# Patient Record
Sex: Female | Born: 1960 | Race: Black or African American | Hispanic: No | Marital: Single | Smoking: Never smoker
Health system: Southern US, Community
[De-identification: ages and names within clinical notes are randomized; demographics above are authoritative.]

## PROBLEM LIST (undated history)

## (undated) DIAGNOSIS — E119 Type 2 diabetes mellitus without complications: Secondary | ICD-10-CM

## (undated) DIAGNOSIS — M549 Dorsalgia, unspecified: Secondary | ICD-10-CM

## (undated) DIAGNOSIS — N950 Postmenopausal bleeding: Secondary | ICD-10-CM

## (undated) DIAGNOSIS — E669 Obesity, unspecified: Secondary | ICD-10-CM

## (undated) DIAGNOSIS — Z973 Presence of spectacles and contact lenses: Secondary | ICD-10-CM

## (undated) DIAGNOSIS — Z794 Long term (current) use of insulin: Secondary | ICD-10-CM

## (undated) DIAGNOSIS — G629 Polyneuropathy, unspecified: Secondary | ICD-10-CM

## (undated) DIAGNOSIS — M1A9XX0 Chronic gout, unspecified, without tophus (tophi): Secondary | ICD-10-CM

## (undated) DIAGNOSIS — E876 Hypokalemia: Secondary | ICD-10-CM

## (undated) DIAGNOSIS — G4730 Sleep apnea, unspecified: Secondary | ICD-10-CM

## (undated) DIAGNOSIS — E039 Hypothyroidism, unspecified: Secondary | ICD-10-CM

## (undated) DIAGNOSIS — D869 Sarcoidosis, unspecified: Secondary | ICD-10-CM

## (undated) DIAGNOSIS — Z9289 Personal history of other medical treatment: Secondary | ICD-10-CM

## (undated) DIAGNOSIS — I10 Essential (primary) hypertension: Secondary | ICD-10-CM

## (undated) DIAGNOSIS — Z1231 Encounter for screening mammogram for malignant neoplasm of breast: Secondary | ICD-10-CM

## (undated) DIAGNOSIS — G4733 Obstructive sleep apnea (adult) (pediatric): Secondary | ICD-10-CM

## (undated) DIAGNOSIS — E049 Nontoxic goiter, unspecified: Secondary | ICD-10-CM

## (undated) DIAGNOSIS — R7301 Impaired fasting glucose: Secondary | ICD-10-CM

## (undated) DIAGNOSIS — E785 Hyperlipidemia, unspecified: Secondary | ICD-10-CM

## (undated) DIAGNOSIS — D860 Sarcoidosis of lung: Secondary | ICD-10-CM

## (undated) DIAGNOSIS — Z01419 Encounter for gynecological examination (general) (routine) without abnormal findings: Secondary | ICD-10-CM

## (undated) HISTORY — DX: Sarcoidosis, unspecified: D86.9

## (undated) HISTORY — DX: Encounter for screening mammogram for malignant neoplasm of breast: Z12.31

## (undated) HISTORY — DX: Obesity, unspecified: E66.9

## (undated) HISTORY — PX: CYST EXCISION: SHX5701

## (undated) HISTORY — DX: Hyperlipidemia, unspecified: E78.5

## (undated) HISTORY — DX: Impaired fasting glucose: R73.01

## (undated) HISTORY — PX: TUBAL LIGATION: SHX77

## (undated) HISTORY — DX: Encounter for gynecological examination (general) (routine) without abnormal findings: Z01.419

## (undated) HISTORY — DX: Personal history of other medical treatment: Z92.89

---

## 1998-02-05 DIAGNOSIS — I10 Essential (primary) hypertension: Secondary | ICD-10-CM

## 1998-02-05 HISTORY — DX: Essential (primary) hypertension: I10

## 1999-04-07 ENCOUNTER — Other Ambulatory Visit: Admission: RE | Admit: 1999-04-07 | Discharge: 1999-04-07 | Payer: Self-pay | Admitting: Family Medicine

## 2000-11-20 ENCOUNTER — Emergency Department (HOSPITAL_COMMUNITY): Admission: EM | Admit: 2000-11-20 | Discharge: 2000-11-20 | Payer: Self-pay | Admitting: Emergency Medicine

## 2003-04-06 ENCOUNTER — Other Ambulatory Visit: Admission: RE | Admit: 2003-04-06 | Discharge: 2003-04-06 | Payer: Self-pay | Admitting: Obstetrics and Gynecology

## 2007-08-22 ENCOUNTER — Encounter: Admission: RE | Admit: 2007-08-22 | Discharge: 2007-08-22 | Payer: Self-pay | Admitting: Cardiology

## 2010-02-26 ENCOUNTER — Encounter: Payer: Self-pay | Admitting: Interventional Cardiology

## 2010-03-22 ENCOUNTER — Inpatient Hospital Stay (INDEPENDENT_AMBULATORY_CARE_PROVIDER_SITE_OTHER)
Admission: RE | Admit: 2010-03-22 | Discharge: 2010-03-22 | Disposition: A | Discharge status: Home / Self Care | Payer: BC Managed Care – PPO | Source: Ambulatory Visit | Attending: Emergency Medicine | Admitting: Emergency Medicine

## 2010-03-22 DIAGNOSIS — I889 Nonspecific lymphadenitis, unspecified: Secondary | ICD-10-CM

## 2010-03-22 DIAGNOSIS — I10 Essential (primary) hypertension: Secondary | ICD-10-CM

## 2010-03-22 DIAGNOSIS — M436 Torticollis: Secondary | ICD-10-CM

## 2010-06-23 ENCOUNTER — Ambulatory Visit (HOSPITAL_BASED_OUTPATIENT_CLINIC_OR_DEPARTMENT_OTHER): Payer: BC Managed Care – PPO | Attending: Cardiology

## 2010-06-23 DIAGNOSIS — G4733 Obstructive sleep apnea (adult) (pediatric): Secondary | ICD-10-CM | POA: Insufficient documentation

## 2010-06-24 DIAGNOSIS — G4733 Obstructive sleep apnea (adult) (pediatric): Secondary | ICD-10-CM

## 2010-06-26 NOTE — Procedures (Signed)
NAME:  Amy Rosario, Amy Rosario NO.:  [id number]  MEDICAL RECORD NO.:  [id number]          PATIENT TYPE:  OUT  LOCATION:  SLEEP CENTER                 FACILITY:  [facility]  PHYSICIAN:  Clinton D. Maple Hudson, MD, FCCP, FACPDATE OF BIRTH:  1962/05/13  DATE OF STUDY:  06/23/2010                           NOCTURNAL POLYSOMNOGRAM  REFERRING PHYSICIAN:  Osvaldo Shipper. Spruill, M.D.  INDICATION FOR STUDY:  Hypersomnia with sleep apnea.  EPWORTH SLEEPINESS SCORE:  11/24.  BMI 48.1.  Weight 335 pounds, height 70 inches.  Neck 16 inches.  MEDICATIONS:  Home medications are charted and reviewed.  SLEEP ARCHITECTURE:  Total sleep time 405 minutes with sleep efficiency 92.9%.  Stage I was 4.7%, stage II 81.2%, stage III absent, REM 14.1% of total sleep time.  Sleep latency 1.5 minutes, REM latency 66 minutes, awake after sleep onset 29.5 minutes, arousal index 13.6.  Bedtime medication:  Clonidine.  RESPIRATORY DATA:  Apnea/hypopnea index (AHI) 11.3 per hour.  A total of 76 events was scored including 1 obstructive apnea and 75 hypopneas. Events were not positional.  REM AHI 11.6 per hour.  There were insufficient early events to permit application of CPAP titration by split protocol on this study night.  Most events appeared after 1 a.m.  OXYGEN DATA:  Moderate snoring with oxygen desaturation to a nadir of 77%.  Mean oxygen saturation through the study of 94.5% on room air and a total of 3.6 minutes of total recording time with saturation less than 88% on room air.  CARDIAC DATA:  Normal sinus rhythm.  MOVEMENT-PARASOMNIA:  No significant movement disturbance.  Bathroom x1.  IMPRESSIONS-RECOMMENDATIONS: 1. Mild obstructive sleep apnea/hypopnea syndrome, apnea/hypopnea     index 11.3 per hour.  Non-positional events with moderate snoring     and oxygen desaturation to a nadir of 77% and mean oxygen     saturation through the study of 94.5% on room air. 2. There were insufficient early  events to permit application of     continuous positive airway pressure titration by     split protocol on the study night.  Consider return for CPAP     titration as a dedicated study if clinically appropriate, otherwise     evaluate for alternative management as indicated.     Clinton D. Maple Hudson, MD, [facility], FACP Diplomate, Biomedical engineer of Sleep Medicine Electronically Signed    CDY/MEDQ  D:  06/24/2010 10:40:38  T:  06/24/2010 23:28:06  Job:  [id number]

## 2010-10-11 ENCOUNTER — Encounter (HOSPITAL_COMMUNITY): Payer: Self-pay | Admitting: *Deleted

## 2010-10-11 ENCOUNTER — Inpatient Hospital Stay (HOSPITAL_COMMUNITY)
Admission: AD | Admit: 2010-10-11 | Discharge: 2010-10-11 | Disposition: A | Discharge status: Home / Self Care | Payer: BC Managed Care – PPO | Source: Ambulatory Visit | Attending: Obstetrics & Gynecology | Admitting: Obstetrics & Gynecology

## 2010-10-11 DIAGNOSIS — I10 Essential (primary) hypertension: Secondary | ICD-10-CM | POA: Insufficient documentation

## 2010-10-11 DIAGNOSIS — N898 Other specified noninflammatory disorders of vagina: Secondary | ICD-10-CM | POA: Insufficient documentation

## 2010-10-11 DIAGNOSIS — M549 Dorsalgia, unspecified: Secondary | ICD-10-CM | POA: Insufficient documentation

## 2010-10-11 DIAGNOSIS — G4730 Sleep apnea, unspecified: Secondary | ICD-10-CM | POA: Insufficient documentation

## 2010-10-11 DIAGNOSIS — R109 Unspecified abdominal pain: Secondary | ICD-10-CM | POA: Insufficient documentation

## 2010-10-11 HISTORY — DX: Essential (primary) hypertension: I10

## 2010-10-11 HISTORY — DX: Sleep apnea, unspecified: G47.30

## 2010-10-11 LAB — WET PREP, GENITAL
Trich, Wet Prep: NONE SEEN
Yeast Wet Prep HPF POC: NONE SEEN

## 2010-10-11 LAB — URINALYSIS, ROUTINE W REFLEX MICROSCOPIC
Bilirubin Urine: NEGATIVE
Nitrite: NEGATIVE
Protein, ur: NEGATIVE mg/dL
Urobilinogen, UA: 0.2 mg/dL (ref 0.0–1.0)
pH: 5.5 (ref 5.0–8.0)

## 2010-10-11 LAB — URINE MICROSCOPIC-ADD ON

## 2010-10-11 LAB — DIFFERENTIAL
Basophils Absolute: 0 10*3/uL (ref 0.0–0.1)
Eosinophils Relative: 3 % (ref 0–5)
Lymphocytes Relative: 45 % (ref 12–46)
Neutro Abs: 5.4 10*3/uL (ref 1.7–7.7)
Neutrophils Relative %: 46 % (ref 43–77)

## 2010-10-11 LAB — CBC
Platelets: 278 10*3/uL (ref 150–400)
RBC: 4.36 MIL/uL (ref 3.87–5.11)
RDW: 14.2 % (ref 11.5–15.5)
WBC: 11.9 10*3/uL — ABNORMAL HIGH (ref 4.0–10.5)

## 2010-10-11 LAB — POCT PREGNANCY, URINE: Preg Test, Ur: NEGATIVE

## 2010-10-11 MED ORDER — IBUPROFEN 600 MG PO TABS
600.0000 mg | ORAL_TABLET | Freq: Once | ORAL | Status: AC
  Administered 2010-10-11: 600 mg via ORAL
  Filled 2010-10-11: qty 1

## 2010-10-11 NOTE — Progress Notes (Signed)
Rt mid-back pain x 1 month. Mid abd pain. Denies dysuria. Denies nausea, vomiting, diarrhea, or fever. Still menstrating but only spotted in august.

## 2010-10-11 NOTE — ED Provider Notes (Signed)
History     CSN: [id number] Arrival date & time: 10/11/2010  6:47 PM  Chief Complaint  Patient presents with  . Back Pain   HPI  Amy Rosario y.o.presents with a 1 month hx of mid, right back pain.  G3 P3.  LMP 8/14 began as pink and a few days later dark red and then stopped.  Sexually active uses condoms.  Describes pain as sharp when bending and certain ways she moves.  Denies urinary complaints.  Hasn't tried medication for the pain.  States she lifted a dresser in June and was fine but then 1 month ago the pain began.  Hx of muscle spasm in her left neck [facility] 2/12 followed with Dr. Shana Chute.  Took Ibuprofen 600 for the pain.    Past Medical History  Diagnosis Date  . Hypertension   . Sleep apnea     No past surgical history on file.  No family history on file.  History  Substance Use Topics  . Smoking status: Never Smoker   . Smokeless tobacco: Not on file  . Alcohol Use: No    OB History    Grav Para Term Preterm Abortions TAB SAB Ect Mult Living   3 3 3       3       Review of Systems  Constitutional: Negative for fever and chills.  Gastrointestinal: Abdominal distention: mid abdominal and mid right back pain.    Genitourinary: Negative.   Musculoskeletal: Positive for back pain.    Physical Exam  BP 160/83  Pulse 73  Temp 99 F (37.2 C)  Resp 20  Ht 5\' 10"  (1.778 m)  Wt 331 lb (150.141 kg)  BMI 47.49 kg/m2  LMP 09/12/2010  Physical Exam  Constitutional: She is oriented to person, place, and time. She appears well-developed. No distress.       Obese, uncomfortable when positioning on the table for her exam.  No acute distress.   HENT:  Head: Normocephalic.  Neck: Normal range of motion.  Pulmonary/Chest: Effort normal.  Abdominal: Soft. There is Tenderness: mild tenderness in the epigastric area.  . There is no rebound and no guarding.  Genitourinary: Uterus is not tender. Enlarged: difficult to assess size secondary to large panus and large  abdomen  Cervix exhibits no motion tenderness, no discharge and no friability. Right adnexum displays no tenderness. Left adnexum displays no tenderness. No erythema or bleeding around the vagina. Vaginal discharge (with mild odor) found.  Musculoskeletal:       Mid right pain without CVA tenderness.  Left without tenderness.    Neurological: She is alert and oriented to person, place, and time.  Skin: Skin is warm and dry.   Results for orders placed during the hospital encounter of 10/11/10 (from the past 24 hour(s))  URINALYSIS, ROUTINE W REFLEX MICROSCOPIC     Status: Abnormal   Collection Time   10/11/10  8:05 PM      Component Value Range   Color, Urine YELLOW  YELLOW    Appearance CLEAR  CLEAR    Specific Gravity, Urine 1.015  1.005 - 1.030    pH 5.5  5.0 - 8.0    Glucose, UA NEGATIVE  NEGATIVE (mg/dL)   Hgb urine dipstick TRACE (*) NEGATIVE    Bilirubin Urine NEGATIVE  NEGATIVE    Ketones, ur NEGATIVE  NEGATIVE (mg/dL)   Protein, ur NEGATIVE  NEGATIVE (mg/dL)   Urobilinogen, UA 0.2  0.0 - 1.0 (mg/dL)  Nitrite NEGATIVE  NEGATIVE    Leukocytes, UA NEGATIVE  NEGATIVE   URINE MICROSCOPIC-ADD ON     Status: Normal   Collection Time   10/11/10  8:05 PM      Component Value Range   Squamous Epithelial / LPF RARE  RARE    WBC, UA 0-2  <3 (WBC/hpf)   RBC / HPF 0-2  <3 (RBC/hpf)   Bacteria, UA RARE  RARE   POCT PREGNANCY, URINE     Status: Normal   Collection Time   10/11/10  8:36 PM      Component Value Range   Preg Test, Ur NEGATIVE    WET PREP, GENITAL     Status: Abnormal   Collection Time   10/11/10  9:11 PM      Component Value Range   Yeast, Wet Prep NONE SEEN  NONE SEEN    Trich, Wet Prep NONE SEEN  NONE SEEN    Clue Cells, Wet Prep FEW (*) NONE SEEN    WBC, Wet Prep HPF POC FEW (*) NONE SEEN   CBC     Status: Abnormal   Collection Time   10/11/10  9:20 PM      Component Value Range   WBC 11.9 (*) 4.0 - 10.5 (K/uL)   RBC 4.36  3.87 - 5.11 (MIL/uL)   Hemoglobin 12.4   12.0 - 15.0 (g/dL)   HCT 16.1  09.6 - 04.5 (%)   MCV 83.7  78.0 - 100.0 (fL)   MCH 28.4  26.0 - 34.0 (pg)   MCHC 34.0  30.0 - 36.0 (g/dL)   RDW 40.9  81.1 - 91.4 (%)   Platelets 278  150 - 400 (K/uL)  DIFFERENTIAL     Status: Abnormal   Collection Time   10/11/10  9:20 PM      Component Value Range   Neutrophils Relative 46  43 - 77 (%)   Neutro Abs 5.4  1.7 - 7.7 (K/uL)   Lymphocytes Relative 45  12 - 46 (%)   Lymphs Abs 5.3 (*) 0.7 - 4.0 (K/uL)   Monocytes Relative 7  3 - 12 (%)   Monocytes Absolute 0.8  0.1 - 1.0 (K/uL)   Eosinophils Relative 3  0 - 5 (%)   Eosinophils Absolute 0.3  0.0 - 0.7 (K/uL)   Basophils Relative 0  0 - 1 (%)   Basophils Absolute 0.0  0.0 - 0.1 (K/uL)   ED Course  Procedures Ibuprofen 600mg  po given to the patient.  Patient states her pain is almost gone.  I offered to send her for further evaluation at [facility].  Patient states she would rather go home and take Ibuprofen that she has at home again in 8 hrs to see if this will keep the pain away.  Instructed to Call Dr. Shana Chute if pain continues.  Reported all labs done tonight to her.    ASSESSMENT:  Back pain  Plan:Take the Ibuprofen 600 mg she has at home 1 pill q8hrs for pain and to call Dr. Shana Chute if back pain continues. '  Eve M Key, NP 10/11/10 2202

## 2010-10-26 ENCOUNTER — Inpatient Hospital Stay (INDEPENDENT_AMBULATORY_CARE_PROVIDER_SITE_OTHER)
Admission: RE | Admit: 2010-10-26 | Discharge: 2010-10-26 | Disposition: A | Discharge status: Home / Self Care | Payer: BC Managed Care – PPO | Source: Ambulatory Visit | Attending: Family Medicine | Admitting: Family Medicine

## 2010-10-26 DIAGNOSIS — I10 Essential (primary) hypertension: Secondary | ICD-10-CM | POA: Insufficient documentation

## 2010-10-26 DIAGNOSIS — Z79899 Other long term (current) drug therapy: Secondary | ICD-10-CM | POA: Insufficient documentation

## 2010-10-26 DIAGNOSIS — R109 Unspecified abdominal pain: Secondary | ICD-10-CM | POA: Insufficient documentation

## 2010-10-26 DIAGNOSIS — M5380 Other specified dorsopathies, site unspecified: Secondary | ICD-10-CM | POA: Insufficient documentation

## 2010-10-26 DIAGNOSIS — M546 Pain in thoracic spine: Secondary | ICD-10-CM | POA: Insufficient documentation

## 2010-10-26 DIAGNOSIS — M549 Dorsalgia, unspecified: Secondary | ICD-10-CM

## 2010-10-26 DIAGNOSIS — R10819 Abdominal tenderness, unspecified site: Secondary | ICD-10-CM | POA: Insufficient documentation

## 2010-10-26 DIAGNOSIS — G8929 Other chronic pain: Secondary | ICD-10-CM | POA: Insufficient documentation

## 2010-10-26 LAB — POCT URINALYSIS DIP (DEVICE)
Bilirubin Urine: NEGATIVE
Nitrite: NEGATIVE
Protein, ur: NEGATIVE mg/dL
pH: 5.5 (ref 5.0–8.0)

## 2010-10-27 ENCOUNTER — Emergency Department (HOSPITAL_COMMUNITY)
Admission: EM | Admit: 2010-10-27 | Discharge: 2010-10-27 | Disposition: A | Discharge status: Home / Self Care | Payer: BC Managed Care – PPO | Attending: Emergency Medicine | Admitting: Emergency Medicine

## 2010-10-27 ENCOUNTER — Emergency Department (HOSPITAL_COMMUNITY): Payer: BC Managed Care – PPO

## 2010-10-27 ENCOUNTER — Encounter (HOSPITAL_COMMUNITY): Payer: Self-pay | Admitting: Radiology

## 2010-10-27 LAB — COMPREHENSIVE METABOLIC PANEL
Albumin: 3.7 g/dL (ref 3.5–5.2)
BUN: 16 mg/dL (ref 6–23)
Creatinine, Ser: 0.91 mg/dL (ref 0.50–1.10)
Total Protein: 8.3 g/dL (ref 6.0–8.3)

## 2010-10-27 LAB — CBC
HCT: 35.7 % — ABNORMAL LOW (ref 36.0–46.0)
MCHC: 33.3 g/dL (ref 30.0–36.0)
MCV: 82.8 fL (ref 78.0–100.0)
RDW: 13.5 % (ref 11.5–15.5)

## 2010-10-27 LAB — URINALYSIS, ROUTINE W REFLEX MICROSCOPIC
Nitrite: NEGATIVE
Specific Gravity, Urine: 1.019 (ref 1.005–1.030)
pH: 6 (ref 5.0–8.0)

## 2010-10-27 LAB — URINE MICROSCOPIC-ADD ON

## 2010-10-27 LAB — DIFFERENTIAL
Basophils Absolute: 0 10*3/uL (ref 0.0–0.1)
Eosinophils Relative: 1 % (ref 0–5)
Lymphocytes Relative: 25 % (ref 12–46)
Lymphs Abs: 3.5 10*3/uL (ref 0.7–4.0)
Monocytes Absolute: 0.9 10*3/uL (ref 0.1–1.0)

## 2010-10-27 LAB — LIPASE, BLOOD: Lipase: 49 U/L (ref 11–59)

## 2010-10-27 MED ORDER — IOHEXOL 300 MG/ML  SOLN
100.0000 mL | Freq: Once | INTRAMUSCULAR | Status: AC | PRN
  Administered 2010-10-27: 100 mL via INTRAVENOUS

## 2010-11-07 ENCOUNTER — Emergency Department (HOSPITAL_COMMUNITY): Payer: BC Managed Care – PPO

## 2010-11-07 ENCOUNTER — Emergency Department (HOSPITAL_COMMUNITY)
Admission: EM | Admit: 2010-11-07 | Discharge: 2010-11-07 | Disposition: A | Discharge status: Home / Self Care | Payer: BC Managed Care – PPO | Attending: Emergency Medicine | Admitting: Emergency Medicine

## 2010-11-07 DIAGNOSIS — R1013 Epigastric pain: Secondary | ICD-10-CM | POA: Insufficient documentation

## 2010-11-07 DIAGNOSIS — K429 Umbilical hernia without obstruction or gangrene: Secondary | ICD-10-CM | POA: Insufficient documentation

## 2010-11-07 DIAGNOSIS — I10 Essential (primary) hypertension: Secondary | ICD-10-CM | POA: Insufficient documentation

## 2010-11-07 DIAGNOSIS — M546 Pain in thoracic spine: Secondary | ICD-10-CM | POA: Insufficient documentation

## 2010-11-07 LAB — COMPREHENSIVE METABOLIC PANEL
ALT: 21 U/L (ref 0–35)
Alkaline Phosphatase: 69 U/L (ref 39–117)
CO2: 30 mEq/L (ref 19–32)
Calcium: 9.8 mg/dL (ref 8.4–10.5)
Chloride: 94 mEq/L — ABNORMAL LOW (ref 96–112)
GFR calc Af Amer: 82 mL/min — ABNORMAL LOW (ref >90)
GFR calc non Af Amer: 71 mL/min — ABNORMAL LOW (ref >90)
Glucose, Bld: 174 mg/dL — ABNORMAL HIGH (ref 70–99)
Sodium: 135 mEq/L (ref 135–145)
Total Bilirubin: 0.4 mg/dL (ref 0.3–1.2)

## 2010-11-07 LAB — URINALYSIS, ROUTINE W REFLEX MICROSCOPIC
Bilirubin Urine: NEGATIVE
Hgb urine dipstick: NEGATIVE
Protein, ur: NEGATIVE mg/dL
Specific Gravity, Urine: 1.012 (ref 1.005–1.030)
Urobilinogen, UA: 0.2 mg/dL (ref 0.0–1.0)

## 2010-11-08 LAB — URINE CULTURE

## 2013-04-23 ENCOUNTER — Emergency Department (HOSPITAL_COMMUNITY)
Admission: EM | Admit: 2013-04-23 | Discharge: 2013-04-23 | Disposition: A | Discharge status: Home / Self Care | Payer: BC Managed Care – PPO | Source: Home / Self Care | Attending: Family Medicine | Admitting: Family Medicine

## 2013-04-23 ENCOUNTER — Emergency Department (INDEPENDENT_AMBULATORY_CARE_PROVIDER_SITE_OTHER): Payer: BC Managed Care – PPO

## 2013-04-23 ENCOUNTER — Encounter (HOSPITAL_COMMUNITY): Payer: Self-pay | Admitting: Emergency Medicine

## 2013-04-23 DIAGNOSIS — IMO0002 Reserved for concepts with insufficient information to code with codable children: Secondary | ICD-10-CM

## 2013-04-23 DIAGNOSIS — S46911A Strain of unspecified muscle, fascia and tendon at shoulder and upper arm level, right arm, initial encounter: Secondary | ICD-10-CM

## 2013-04-23 MED ORDER — CYCLOBENZAPRINE HCL 5 MG PO TABS: 5.0000 mg | ORAL_TABLET | Freq: Three times a day (TID) | ORAL | Status: DC | PRN

## 2013-04-23 NOTE — ED Notes (Signed)
Patient states injured her right shoulder about a week ago Lifting a box-making it difficult to raise her arm

## 2013-04-23 NOTE — Discharge Instructions (Signed)
Wear sling for comfort, use heat and medicine as needed, see orthopedist if further problems.

## 2013-04-23 NOTE — ED Provider Notes (Signed)
CSN: [id number]     Arrival date & time 04/23/13  1627 History   First MD Initiated Contact with Patient 04/23/13 1800     Chief Complaint  Patient presents with  . Shoulder Pain   (Consider location/radiation/quality/duration/timing/severity/associated sxs/prior Treatment) Patient is a 53 y.o. female presenting with shoulder pain. The history is provided by the patient.  Shoulder Pain This is a new problem. The current episode started more than 1 week ago (lifting a heavy box at work , felt pain in shoulder, sx improved briefly now recurred., no neuro sx.). The problem has not changed since onset.Pertinent negatives include no chest pain and no abdominal pain. The symptoms are aggravated by exertion. Nothing relieves the symptoms.    Past Medical History  Diagnosis Date  . Hypertension   . Sleep apnea    History reviewed. No pertinent past surgical history. No family history on file. History  Substance Use Topics  . Smoking status: Never Smoker   . Smokeless tobacco: Not on file  . Alcohol Use: No   OB History   Grav Para Term Preterm Abortions TAB SAB Ect Mult Living   3 3 3       3      Review of Systems  Constitutional: Negative.   Cardiovascular: Negative for chest pain.  Gastrointestinal: Negative for abdominal pain.  Musculoskeletal: Positive for myalgias. Negative for back pain and joint swelling.  Skin: Negative.     Allergies  Penicillins  Home Medications   Current Outpatient Rx  Name  Route  Sig  Dispense  Refill  . Amlodipine-Valsartan-HCTZ (EXFORGE HCT) 10-320-25 MG TABS   Oral   Take 1 tablet by mouth daily.           Marland Kitchen aspirin 81 MG tablet   Oral   Take 81 mg by mouth daily.           . cloNIDine (CATAPRES) 0.2 MG tablet   Oral   Take 0.2 mg by mouth daily.           . cyclobenzaprine (FLEXERIL) 5 MG tablet   Oral   Take 1 tablet (5 mg total) by mouth 3 (three) times daily as needed for muscle spasms.   30 tablet   0    BP 189/97   Pulse 84  Temp(Src) 97.8 F (36.6 C)  Resp 20  SpO2 96%  LMP 09/12/2010 Physical Exam  Nursing note and vitals reviewed. Constitutional: She is oriented to person, place, and time. She appears well-developed and well-nourished.  Musculoskeletal: She exhibits tenderness.       Right shoulder: She exhibits tenderness, pain, spasm and decreased strength. She exhibits normal range of motion, no bony tenderness, no swelling, no effusion, no crepitus and normal pulse.       Arms: Neurological: She is alert and oriented to person, place, and time.  Skin: Skin is warm and dry.    ED Course  Procedures (including critical care time) Labs Review Labs Reviewed - No data to display Imaging Review Dg Shoulder Right  04/23/2013   CLINICAL DATA:  Right shoulder pain.  EXAM: RIGHT SHOULDER - 2+ VIEW  COMPARISON:  None.  FINDINGS: The right [facility] joint is intact. The right shoulder is located. No evidence for an acute fracture. The visualized right ribs are intact. There are mild degenerative changes along the undersurface of the acromion.  IMPRESSION: No acute bone abnormality to the right shoulder.   Electronically Signed   By: Scherrie Gerlach.D.  On: 04/23/2013 18:52   X-rays reviewed and report per radiologist.   MDM   1. Muscle strain of right shoulder region        Billy Fischer, MD 04/23/13 [phone]

## 2013-05-19 ENCOUNTER — Encounter: Payer: Self-pay | Admitting: Medical

## 2013-05-27 ENCOUNTER — Ambulatory Visit (INDEPENDENT_AMBULATORY_CARE_PROVIDER_SITE_OTHER): Payer: BC Managed Care – PPO | Admitting: Medical

## 2013-05-27 ENCOUNTER — Encounter: Payer: Self-pay | Admitting: Medical

## 2013-05-27 ENCOUNTER — Other Ambulatory Visit (HOSPITAL_COMMUNITY)
Admission: RE | Admit: 2013-05-27 | Discharge: 2013-05-27 | Disposition: A | Discharge status: Home / Self Care | Payer: BC Managed Care – PPO | Source: Ambulatory Visit | Attending: Medical | Admitting: Medical

## 2013-05-27 ENCOUNTER — Other Ambulatory Visit: Payer: Self-pay | Admitting: Medical

## 2013-05-27 VITALS — BP 140/80 | HR 88 | Temp 98.1°F | Resp 16 | Ht 69.0 in | Wt 322.0 lb

## 2013-05-27 DIAGNOSIS — R7301 Impaired fasting glucose: Secondary | ICD-10-CM | POA: Insufficient documentation

## 2013-05-27 DIAGNOSIS — Z23 Encounter for immunization: Secondary | ICD-10-CM

## 2013-05-27 DIAGNOSIS — Z124 Encounter for screening for malignant neoplasm of cervix: Secondary | ICD-10-CM

## 2013-05-27 DIAGNOSIS — E669 Obesity, unspecified: Secondary | ICD-10-CM

## 2013-05-27 DIAGNOSIS — Z1211 Encounter for screening for malignant neoplasm of colon: Secondary | ICD-10-CM

## 2013-05-27 DIAGNOSIS — Z113 Encounter for screening for infections with a predominantly sexual mode of transmission: Secondary | ICD-10-CM

## 2013-05-27 DIAGNOSIS — Z1231 Encounter for screening mammogram for malignant neoplasm of breast: Secondary | ICD-10-CM

## 2013-05-27 DIAGNOSIS — I10 Essential (primary) hypertension: Secondary | ICD-10-CM | POA: Insufficient documentation

## 2013-05-27 DIAGNOSIS — Z0000 Encounter for general adult medical examination without abnormal findings: Secondary | ICD-10-CM

## 2013-05-27 DIAGNOSIS — E049 Nontoxic goiter, unspecified: Secondary | ICD-10-CM

## 2013-05-27 DIAGNOSIS — E785 Hyperlipidemia, unspecified: Secondary | ICD-10-CM | POA: Insufficient documentation

## 2013-05-27 DIAGNOSIS — Z1239 Encounter for other screening for malignant neoplasm of breast: Secondary | ICD-10-CM

## 2013-05-27 DIAGNOSIS — Z1151 Encounter for screening for human papillomavirus (HPV): Secondary | ICD-10-CM | POA: Insufficient documentation

## 2013-05-27 DIAGNOSIS — H579 Unspecified disorder of eye and adnexa: Secondary | ICD-10-CM

## 2013-05-27 LAB — POCT URINALYSIS DIPSTICK
Bilirubin, UA: NEGATIVE
GLUCOSE UA: NEGATIVE
Ketones, UA: NEGATIVE
Leukocytes, UA: NEGATIVE
NITRITE UA: NEGATIVE
PH UA: 5
RBC UA: NEGATIVE
Spec Grav, UA: 1.015
UROBILINOGEN UA: NEGATIVE

## 2013-05-27 LAB — COMPREHENSIVE METABOLIC PANEL
ALBUMIN: 4.4 g/dL (ref 3.5–5.2)
ALT: 32 U/L (ref 0–35)
AST: 36 U/L (ref 0–37)
Alkaline Phosphatase: 60 U/L (ref 39–117)
BUN: 18 mg/dL (ref 6–23)
CALCIUM: 9.9 mg/dL (ref 8.4–10.5)
CHLORIDE: 99 meq/L (ref 96–112)
CO2: 30 meq/L (ref 19–32)
CREATININE: 0.92 mg/dL (ref 0.50–1.10)
GLUCOSE: 117 mg/dL — AB (ref 70–99)
POTASSIUM: 3.8 meq/L (ref 3.5–5.3)
Sodium: 140 mEq/L (ref 135–145)
Total Bilirubin: 0.5 mg/dL (ref 0.2–1.2)
Total Protein: 7.4 g/dL (ref 6.0–8.3)

## 2013-05-27 LAB — LIPID PANEL
Cholesterol: 159 mg/dL (ref 0–200)
HDL: 47 mg/dL (ref >39)
LDL Cholesterol: 79 mg/dL (ref 0–99)
Total CHOL/HDL Ratio: 3.4 Ratio
Triglycerides: 167 mg/dL — ABNORMAL HIGH (ref <150)
VLDL: 33 mg/dL (ref 0–40)

## 2013-05-27 LAB — CBC
HCT: 35.8 % — ABNORMAL LOW (ref 36.0–46.0)
Hemoglobin: 12.3 g/dL (ref 12.0–15.0)
MCH: 28.1 pg (ref 26.0–34.0)
MCHC: 34.4 g/dL (ref 30.0–36.0)
MCV: 81.9 fL (ref 78.0–100.0)
PLATELETS: 255 10*3/uL (ref 150–400)
RBC: 4.37 MIL/uL (ref 3.87–5.11)
RDW: 14.2 % (ref 11.5–15.5)
WBC: 8.4 10*3/uL (ref 4.0–10.5)

## 2013-05-27 LAB — TSH: TSH: 3.049 u[IU]/mL (ref 0.350–4.500)

## 2013-05-27 LAB — HEMOGLOBIN A1C
HEMOGLOBIN A1C: 9.9 % — AB (ref <5.7)
MEAN PLASMA GLUCOSE: 237 mg/dL — AB (ref <117)

## 2013-05-27 NOTE — Progress Notes (Signed)
Subjective:   HPI  Amy Rosario is a 53 y.o. female who presents for a complete physical.  New patient today.  Primarily been seeing the cardiologist.  Medical care team includes:  Cardiology - Dr. Vic Blackbird, PA-C here for primary care   Preventative care: Last ophthalmology visit:N/A Last dental visit:N/A Last colonoscopy:N/A Last mammogram:N/A Last gynecological exam:2 TO 3 YEARS AGO Last EKG:N/A Last labs:NEW  Prior vaccinations: TD or Tdap:OVER 10 YEARS AGO Influenza:NO Pneumococcal:N/A Shingles/Zostavax:N/A  Advanced directive:N/A Health care power of attorney:N/A Living will:N/A  Concerns: HTN - compliant with blood pressure medicine most the time, has seen cardiology prior for this.  Impaired fasting glucose - on metformin and glipizide, she says diet is not good  Hyperlipidemia - compliant with medication but not diet  OSA - was using CPAP regularly until it started making a noise, is awaiting home health to evaluate the machine  Obesity-walking at work but no other exercise  Reviewed their medical, surgical, family, social, medication, and allergy history and updated chart as appropriate.  Past Medical History  Diagnosis Date  . Sleep apnea     on CPAP  . Hyperlipidemia   . Impaired fasting blood sugar 2014  . Hypertension 2000  . Other screening mammogram     planned for 05/2013  . Routine gynecological examination     last pap 2012  . Obesity   . H/O echocardiogram     Dr. Charolette Forward, [facility]    Past Surgical History  Procedure Laterality Date  . Cyst excision      right low back  . Colonoscopy      never    History   Social History  . Marital Status: Single    Spouse Name: N/A    Number of Children: N/A  . Years of Education: N/A   Occupational History  . Not on file.   Social History Main Topics  . Smoking status: Never Smoker   . Smokeless tobacco: Not on file  . Alcohol Use: No  .  Drug Use: No  . Sexual Activity: Yes    Birth Control/ Protection: Condom   Other Topics Concern  . Not on file   Social History Narrative   Lives with sister Amy Rosario, single, 3 sons, Exercise -walks a lot on the job, Consulting civil engineer at [employer], visit different churches    Family History  Problem Relation Age of Onset  . Heart disease Father   . Hypertension Father   . Cancer Neg Hx   . Stroke Neg Hx   . Diabetes Neg Hx   . Heart disease Brother     Current outpatient prescriptions:Amlodipine-Valsartan-HCTZ (EXFORGE HCT) 10-320-25 MG TABS, Take 1 tablet by mouth daily.  , Disp: , Rfl: ;  aspirin 81 MG tablet, Take 81 mg by mouth daily.  , Disp: , Rfl: ;  cloNIDine (CATAPRES) 0.2 MG tablet, Take 0.2 mg by mouth 2 (two) times daily. , Disp: , Rfl: ;  glimepiride (AMARYL) 2 MG tablet, Take 2 mg by mouth daily with breakfast., Disp: , Rfl:  metformin (FORTAMET) 1000 MG (OSM) 24 hr tablet, Take 1,000 mg by mouth 2 (two) times daily., Disp: , Rfl: ;  nebivolol (BYSTOLIC) 10 MG tablet, Take 10 mg by mouth 2 (two) times daily., Disp: , Rfl: ;  rosuvastatin (CRESTOR) 10 MG tablet, Take 10 mg by mouth daily., Disp: , Rfl:   Allergies  Allergen Reactions  . Penicillins Hives  Review of Systems Constitutional: -fever, -chills, -sweats, -unexpected weight change, -decreased appetite, -fatigue Allergy: -sneezing, -itching, -congestion Dermatology: -changing moles, --rash, -lumps ENT: -runny nose, -ear pain, -sore throat, -hoarseness, -sinus pain, -teeth pain, - ringing in ears, -hearing loss, -nosebleeds Cardiology: -chest pain, -palpitations, -swelling, -difficulty breathing when lying flat, -waking up short of breath Respiratory: -cough, -shortness of breath, -difficulty breathing with exercise or exertion, -wheezing, -coughing up blood Gastroenterology: -abdominal pain, -nausea, -vomiting, -diarrhea, -constipation, -blood in stool, -changes in bowel movement,  -difficulty swallowing or eating Hematology: -bleeding, -bruising  Musculoskeletal: -joint aches, -muscle aches, -joint swelling, -back pain, -neck pain, -cramping, -changes in gait Ophthalmology: denies vision changes, eye redness, itching, discharge Urology: -burning with urination, -difficulty urinating, -blood in urine, -urinary frequency, -urgency, -incontinence Neurology: -headache, -weakness, -tingling, -numbness, -memory loss, -falls, -dizziness Psychology: -depressed mood, -agitation, -sleep problems     Objective:   Physical Exam  BP 140/80  Pulse 88  Temp(Src) 98.1 F (36.7 C) (Oral)  Resp 16  Ht 5\' 9"  (1.753 m)  Wt 322 lb (146.058 kg)  BMI 47.53 kg/m2  LMP 09/12/2010  General appearance: alert, no distress, WD/WN, morbidly obese AA female, pleasant Skin: several neck skin tags right and anterior, other scattered benign apppearing macules HEENT: normocephalic, conjunctiva/corneas normal, sclerae anicteric, PERRLA, EOMi, nares patent, no discharge or erythema, pharynx normal Oral cavity: MMM, tongue normal, teeth - some upper decay, missing several upper teeth/molars, lower teeth with plaque Neck: supple, no lymphadenopathy, goiter present, asymmetric, larger on right, no definite nodule, no other masses, normal ROM, no bruits Chest: non tender, normal shape and expansion Heart: RRR, normal S1, S2, no murmurs Lungs: CTA bilaterally, no wheezes, rhonchi, or rales Abdomen: +bs, soft, obese making exam difficult, some fullness RUQ and epigastric with tenderness, hard to discern if mass or not, otherwise non distended, no masses, no hepatomegaly, no splenomegaly, no bruits Back: non tender, normal ROM, no scoliosis Musculoskeletal: upper extremities non tender, no obvious deformity, normal ROM throughout, lower extremities non tender, no obvious deformity, normal ROM throughout Extremities: no edema, no cyanosis, no clubbing Pulses: 2+ symmetric, upper and lower extremities,  normal cap refill Neurological: alert, oriented x 3, CN2-12 intact, strength normal upper extremities and lower extremities, sensation normal throughout, DTRs 2+ throughout, no cerebellar signs, gait normal Psychiatric: normal affect, behavior normal, pleasant  Breast: nontender, no masses or lumps, no skin changes, no nipple discharge or inversion, no axillary lymphadenopathy Gyn: Normal external genitalia without lesions, vagina with normal mucosa, cervix without lesions, no cervical motion tenderness, no abnormal vaginal discharge.  Uterus and adnexa not enlarged, nontender, no masses.  Pap performed.  Exam chaperoned by nurse. Rectal: deferred to GI    Assessment and Plan :    Encounter Diagnoses  Name Primary?  . Routine general medical examination at a health care facility Yes  . Obesity, unspecified   . Essential hypertension, benign   . Impaired fasting blood sugar   . Hyperlipidemia   . Special screening for malignant neoplasms, colon   . Screening for breast cancer   . Screening for cervical cancer   . Screen for STD (sexually transmitted disease)      Physical exam - discussed healthy lifestyle, diet, exercise, preventative care, vaccinations, and addressed their concerns.  Handout given.  Of note, there is abdominal fullness possible mass, there is a thyroid goiter noticed on exam.  Pending labs we'll need to followup on these issues. I made her aware of these  Counseled on the Tdap (  tetanus, diptheria, and acellular pertussis) vaccine.  Vaccine information sheet given. Tdap vaccine given after consent obtained.  Specific recommendations  I recommend you see an eye doctor yearly and given your abnormal vision screen  I recommend you see a dentist yearly  We will refer you for your first screening colonoscopy  We will refer you for your first mammogram  We will call with lab results and Pap smear results  We updated your Tdap vaccine today  Get a flu shot every  year in September or the fall  I do recommend you make changes with your diet, eat healthier, exercise more frequently and work on weight loss  We will need you to come back in to followup on the thyroid goiter and likely followup on lab results  Follow-up pending studies

## 2013-05-27 NOTE — Patient Instructions (Signed)
  Thank you for giving me the opportunity to serve you today.    Your diagnosis today includes: Encounter Diagnoses  Name Primary?  . Routine general medical examination at a health care facility Yes  . Obesity, unspecified   . Essential hypertension, benign   . Impaired fasting blood sugar   . Hyperlipidemia   . Special screening for malignant neoplasms, colon   . Screening for breast cancer   . Screening for cervical cancer   . Screen for STD (sexually transmitted disease)   . Thyroid goiter   . Abnormal vision screen   . Need for Tdap vaccination      Specific recommendations today include:  I recommend you see an eye doctor yearly and given your abnormal vision screen  I recommend you see a dentist yearly  We will refer you for your first screening colonoscopy  We will refer you for your first mammogram  We will call with lab results and Pap smear results  We updated your Tdap vaccine today  Get a flu shot every year in September or the fall  I do recommend you make changes with your diet, eat healthier, exercise more frequently and work on weight loss  We will need you to come back in to followup on the thyroid goiter and likely followup on lab results  Return pending studies, referrals.

## 2013-05-28 ENCOUNTER — Telehealth: Payer: Self-pay

## 2013-05-28 LAB — HIV ANTIBODY (ROUTINE TESTING W REFLEX): HIV: NONREACTIVE

## 2013-05-28 LAB — RPR

## 2013-05-28 NOTE — Progress Notes (Signed)
Reported to patient, scheduled appointment 06/10/13 @ 3:15/WL

## 2013-05-28 NOTE — Telephone Encounter (Signed)
Results reported and follow up appointment scheduled.

## 2013-05-28 NOTE — Telephone Encounter (Signed)
Message copied by Louie Bun on Thu May 28, 2013  8:35 AM ------      Message from: Carlena Hurl      Created: Thu May 28, 2013  7:31 AM       She is in fact diabetic.  Most of the other labs ok.  Lets have her return soon for discussion of diabetes, medication changes, and further eval of the thyroid goiter and pelvic/abdominal mass.  30 min visit ------

## 2013-06-09 ENCOUNTER — Other Ambulatory Visit: Payer: Self-pay | Admitting: Family Medicine

## 2013-06-09 DIAGNOSIS — R87629 Unspecified abnormal cytological findings in specimens from vagina: Secondary | ICD-10-CM

## 2013-06-10 ENCOUNTER — Ambulatory Visit
Admission: RE | Admit: 2013-06-10 | Discharge: 2013-06-10 | Disposition: A | Discharge status: Home / Self Care | Payer: BC Managed Care – PPO | Source: Ambulatory Visit | Attending: Medical | Admitting: Medical

## 2013-06-10 ENCOUNTER — Institutional Professional Consult (permissible substitution): Payer: Self-pay | Admitting: Medical

## 2013-06-10 ENCOUNTER — Encounter (INDEPENDENT_AMBULATORY_CARE_PROVIDER_SITE_OTHER): Payer: Self-pay

## 2013-06-10 DIAGNOSIS — Z1231 Encounter for screening mammogram for malignant neoplasm of breast: Secondary | ICD-10-CM

## 2013-06-11 ENCOUNTER — Encounter: Payer: Self-pay | Admitting: Medical

## 2013-06-11 ENCOUNTER — Ambulatory Visit (INDEPENDENT_AMBULATORY_CARE_PROVIDER_SITE_OTHER): Payer: BC Managed Care – PPO | Admitting: Medical

## 2013-06-11 ENCOUNTER — Telehealth: Payer: Self-pay | Admitting: Medical

## 2013-06-11 ENCOUNTER — Other Ambulatory Visit: Payer: Self-pay | Admitting: Medical

## 2013-06-11 VITALS — BP 160/90 | HR 72 | Temp 98.3°F | Resp 16 | Wt 323.0 lb

## 2013-06-11 DIAGNOSIS — R928 Other abnormal and inconclusive findings on diagnostic imaging of breast: Secondary | ICD-10-CM

## 2013-06-11 DIAGNOSIS — IMO0001 Reserved for inherently not codable concepts without codable children: Secondary | ICD-10-CM

## 2013-06-11 DIAGNOSIS — E049 Nontoxic goiter, unspecified: Secondary | ICD-10-CM

## 2013-06-11 DIAGNOSIS — R87619 Unspecified abnormal cytological findings in specimens from cervix uteri: Secondary | ICD-10-CM

## 2013-06-11 DIAGNOSIS — E1165 Type 2 diabetes mellitus with hyperglycemia: Principal | ICD-10-CM

## 2013-06-11 DIAGNOSIS — R198 Other specified symptoms and signs involving the digestive system and abdomen: Secondary | ICD-10-CM

## 2013-06-11 MED ORDER — INSULIN DETEMIR 100 UNIT/ML FLEXPEN: 10.0000 [IU] | PEN_INJECTOR | Freq: Every day | SUBCUTANEOUS | Status: DC

## 2013-06-11 NOTE — Telephone Encounter (Signed)
Refer to gynecology for abnormal pap if not done so already  Set up thyroid soft tissue ultrasound

## 2013-06-11 NOTE — Progress Notes (Signed)
   Subjective:   Amy Rosario is a 53 y.o. female presenting on 06/11/2013 with Follow-up  Here for recheck on several issues.  I saw her recently for new patient physical.   Here to discuss labs, findings.  DM type II - usually gets 170-200s.  Has seen as low as 81.  taking Fortamet/Metformin 500mg  BID.  No other prior medications.  Trying to make healthier diet changes   Doesn't recall prior abnormal pap.  Here for discussed of abdominal exam fullness and abnormal pap from last visit.  No other complaint.  Review of Systems ROS as in subjective      Objective:     Filed Vitals:   06/11/13 1340  BP: 160/90  Pulse: 72  Temp: 98.3 F (36.8 C)  Resp: 16    General appearance: alert, no distress, WD/WN Abdomen: +bs, soft, FUQ fullness, but obese abdomen makes exam difficult, no definite mass, but +tender with RUQ pressure, non tender, non distended, no masses, no hepatomegaly, no splenomegaly Pulses: 2+ symmetric, upper and lower extremities, normal cap refill Ext: no edema     Assessment: Encounter Diagnoses  Name Primary?  . Type II or unspecified type diabetes mellitus without mention of complication, uncontrolled Yes  . Thyroid goiter   . Abnormal Pap smear of cervix   . Abdominal fullness      Plan: Diabetes type 2, recent hemoglobin A1c 9.9%. Continue Fortamet 1000mg  twice a day, continue glimepiride 2 mg daily, add Levemir flex touch pen at bedtime 10 unit daily.  Nurse gave demonstration office, discuss proper use of medication.  Continue to check blood sugars, recheck in one month  Thyroid goiter-referral for baseline ultrasound, recent TSH normal  Abnormal Pap smear-refer to gynecology, discussed results  Abdominal fullness-right upper quadrant, some tenderness on exam but otherwise she has no complaints of this area.  Consider ultrasound going forward, however CT of abdomen 2012 showed fatty liver disease   Amy Rosario was seen today for  follow-up.  Diagnoses and associated orders for this visit:  Type II or unspecified type diabetes mellitus without mention of complication, uncontrolled  Thyroid goiter - US Soft Tissue Head/Neck; Future  Abnormal Pap smear of cervix  Abdominal fullness  Other Orders - Insulin Detemir (LEVEMIR FLEXPEN) 100 UNIT/ML Pen; Inject 10 Units into the skin daily at 10 pm.    Return pending studies.

## 2013-06-15 NOTE — Telephone Encounter (Signed)
Patient already has an appointment to see GYN for the abnormal pap. I will work on her appointment for her Thyroid U/S. CLS

## 2013-06-17 ENCOUNTER — Telehealth: Payer: Self-pay | Admitting: Family Medicine

## 2013-06-17 MED ORDER — PEN NEEDLES 31G X 6 MM MISC: Status: DC

## 2013-06-17 NOTE — Telephone Encounter (Signed)
Pt needs the needles for the levamir pen sent to [facility] on [address]

## 2013-06-17 NOTE — Telephone Encounter (Signed)
done

## 2013-06-19 ENCOUNTER — Ambulatory Visit
Admission: RE | Admit: 2013-06-19 | Discharge: 2013-06-19 | Disposition: A | Discharge status: Home / Self Care | Payer: BC Managed Care – PPO | Source: Ambulatory Visit | Attending: Medical | Admitting: Medical

## 2013-06-19 ENCOUNTER — Telehealth: Payer: Self-pay | Admitting: Internal Medicine

## 2013-06-19 DIAGNOSIS — E049 Nontoxic goiter, unspecified: Secondary | ICD-10-CM

## 2013-06-19 NOTE — Telephone Encounter (Signed)
I was trying to put order in system for Fine needle biposy but tammy at [facility] who does this is not there today and she will be back on Monday. Please call her @ [phone]

## 2013-06-22 ENCOUNTER — Other Ambulatory Visit: Payer: Self-pay | Admitting: Medical

## 2013-06-22 DIAGNOSIS — E041 Nontoxic single thyroid nodule: Secondary | ICD-10-CM

## 2013-06-24 ENCOUNTER — Ambulatory Visit
Admission: RE | Admit: 2013-06-24 | Discharge: 2013-06-24 | Disposition: A | Discharge status: Home / Self Care | Payer: BC Managed Care – PPO | Source: Ambulatory Visit | Attending: Medical | Admitting: Medical

## 2013-06-24 ENCOUNTER — Encounter (INDEPENDENT_AMBULATORY_CARE_PROVIDER_SITE_OTHER): Payer: Self-pay

## 2013-06-24 DIAGNOSIS — R928 Other abnormal and inconclusive findings on diagnostic imaging of breast: Secondary | ICD-10-CM

## 2013-06-26 ENCOUNTER — Encounter: Payer: Self-pay | Admitting: Gynecology

## 2013-06-26 ENCOUNTER — Ambulatory Visit (INDEPENDENT_AMBULATORY_CARE_PROVIDER_SITE_OTHER): Payer: BC Managed Care – PPO | Admitting: Gynecology

## 2013-06-26 VITALS — BP 136/90 | Ht 69.0 in | Wt 324.0 lb

## 2013-06-26 DIAGNOSIS — N870 Mild cervical dysplasia: Secondary | ICD-10-CM

## 2013-06-26 NOTE — Progress Notes (Signed)
   Patient is a 53 year old who was referred to our practice as a courtesy of Chana Bode, [facility] as a result of the patient's Pap smear which demonstrated low-grade intraepithelial lesion negative HPV (negative GC and Chlamydia). Patient stated that she reached menopause in November 2014 and is having no vasomotor symptoms. The patient has not been sexually active in quite some time. Patient does not recall when she had her prior Pap smear.  Patient underwent a detail colposcopic evaluation of the external genitalia, perineum and perirectal region. This was followed by a speculum exam of the entire vagina cervix and fornix. Acetic acid was applied. No lesions were seen the endocervical speculum was utilized. The transformation zone was visualized. An ECC was obtained.  Assessment/plan: Patient with LGSIL negative HBV and negative colposcopy and ECC was performed today. ECC can't back normal we will follow accordingly the guidelines with the recommendation of a Pap smear with her cotesting for HPV. Patient was provided with literature information. Her primary caregiver Pap smear in a year.

## 2013-06-26 NOTE — Patient Instructions (Signed)
Colposcopy  Colposcopy is a procedure to examine your cervix and vagina, or the area around the outside of your vagina, for abnormalities or signs of disease. The procedure is done using a lighted microscope called a colposcope. Tissue samples may be collected during the colposcopy if your health care provider finds any unusual cells. A colposcopy may be done if a woman has:   An abnormal Pap test. A Pap test is a medical test done to evaluate cells that are on the surface of the cervix.   A Pap test result that is suggestive of human papillomavirus (HPV). This virus can cause genital warts and is linked to the development of cervical cancer.   A sore on her cervix and the results of a Pap test were normal.   Genital warts on the cervix or in or around the outside of the vagina.   A mother who took the drug diethylstilbestrol (DES) while pregnant.   Painful intercourse.   Vaginal bleeding, especially after sexual intercourse.  LET YOUR HEALTH CARE PROVIDER KNOW ABOUT:   Any allergies you have.   All medicines you are taking, including vitamins, herbs, eye drops, creams, and over-the-counter medicines.   Previous problems you or members of your family have had with the use of anesthetics.   Any blood disorders you have.   Previous surgeries you have had.   Medical conditions you have.  RISKS AND COMPLICATIONS  Generally, a colposcopy is a safe procedure. However, as with any procedure, complications can occur. Possible complications include:   Bleeding.   Infection.   Missed lesions.  BEFORE THE PROCEDURE    Tell your health care provider if you have your menstrual period. A colposcopy typically is not done during menstruation.   For 24 hours before the colposcopy, do not:   Douche.   Use tampons.   Use medicines, creams, or suppositories in the vagina.   Have sexual intercourse.  PROCEDURE   During the procedure, you will be lying on your back with your feet in foot rests (stirrups). A warm  metal or plastic instrument (speculum) will be placed in your vagina to keep it open and to allow the health care provider to see the cervix. The colposcope will be placed outside the vagina. It will be used to magnify and examine the cervix, vagina, and the area around the outside of the vagina. A small amount of liquid solution will be placed on the area that is to be viewed. This solution will make it easier to see the abnormal cells. Your health care provider will use tools to suck out mucus and cells from the canal of the cervix. Then he or she will record the location of the abnormal areas.  If a biopsy is done during the procedure, a medicine will usually be given to numb the area (local anesthetic). You may feel mild pain or cramping while the biopsy is done. After the procedure, tissue samples collected during the biopsy will be sent to a lab for analysis.  AFTER THE PROCEDURE   You will be given instructions on when to follow up with your health care provider for your test results. It is important to keep your appointment.  Document Released: 04/14/2002 Document Revised: 09/24/2012 Document Reviewed: 08/21/2012  ExitCare Patient Information 2014 [employer].

## 2013-07-02 ENCOUNTER — Other Ambulatory Visit (HOSPITAL_COMMUNITY)
Admission: RE | Admit: 2013-07-02 | Discharge: 2013-07-02 | Disposition: A | Discharge status: Home / Self Care | Payer: BC Managed Care – PPO | Source: Ambulatory Visit | Attending: Interventional Radiology | Admitting: Interventional Radiology

## 2013-07-02 ENCOUNTER — Ambulatory Visit
Admission: RE | Admit: 2013-07-02 | Discharge: 2013-07-02 | Disposition: A | Discharge status: Home / Self Care | Payer: BC Managed Care – PPO | Source: Ambulatory Visit | Attending: Medical | Admitting: Medical

## 2013-07-02 DIAGNOSIS — E041 Nontoxic single thyroid nodule: Secondary | ICD-10-CM | POA: Insufficient documentation

## 2013-09-10 ENCOUNTER — Encounter (HOSPITAL_COMMUNITY): Payer: Self-pay | Admitting: *Deleted

## 2013-09-18 ENCOUNTER — Other Ambulatory Visit: Payer: Self-pay | Admitting: Gastroenterology

## 2013-09-21 ENCOUNTER — Ambulatory Visit (HOSPITAL_COMMUNITY): Payer: BC Managed Care – PPO | Admitting: Certified Registered Nurse Anesthetist

## 2013-09-21 ENCOUNTER — Encounter (HOSPITAL_COMMUNITY)
Admission: RE | Disposition: A | Discharge status: Home / Self Care | Payer: Self-pay | Source: Ambulatory Visit | Attending: Gastroenterology

## 2013-09-21 ENCOUNTER — Encounter (HOSPITAL_COMMUNITY): Payer: Self-pay | Admitting: *Deleted

## 2013-09-21 ENCOUNTER — Encounter (HOSPITAL_COMMUNITY): Payer: BC Managed Care – PPO | Admitting: Certified Registered Nurse Anesthetist

## 2013-09-21 ENCOUNTER — Ambulatory Visit (HOSPITAL_COMMUNITY)
Admission: RE | Admit: 2013-09-21 | Discharge: 2013-09-21 | Disposition: A | Discharge status: Home / Self Care | Payer: BC Managed Care – PPO | Source: Ambulatory Visit | Attending: Gastroenterology | Admitting: Gastroenterology

## 2013-09-21 DIAGNOSIS — Z6841 Body Mass Index (BMI) 40.0 and over, adult: Secondary | ICD-10-CM | POA: Diagnosis not present

## 2013-09-21 DIAGNOSIS — I10 Essential (primary) hypertension: Secondary | ICD-10-CM | POA: Diagnosis not present

## 2013-09-21 DIAGNOSIS — E119 Type 2 diabetes mellitus without complications: Secondary | ICD-10-CM | POA: Insufficient documentation

## 2013-09-21 DIAGNOSIS — Z79899 Other long term (current) drug therapy: Secondary | ICD-10-CM | POA: Diagnosis not present

## 2013-09-21 DIAGNOSIS — E049 Nontoxic goiter, unspecified: Secondary | ICD-10-CM | POA: Diagnosis not present

## 2013-09-21 DIAGNOSIS — E785 Hyperlipidemia, unspecified: Secondary | ICD-10-CM | POA: Diagnosis not present

## 2013-09-21 DIAGNOSIS — Z7982 Long term (current) use of aspirin: Secondary | ICD-10-CM | POA: Insufficient documentation

## 2013-09-21 DIAGNOSIS — Z794 Long term (current) use of insulin: Secondary | ICD-10-CM | POA: Insufficient documentation

## 2013-09-21 DIAGNOSIS — G4730 Sleep apnea, unspecified: Secondary | ICD-10-CM | POA: Diagnosis not present

## 2013-09-21 DIAGNOSIS — Z1211 Encounter for screening for malignant neoplasm of colon: Secondary | ICD-10-CM | POA: Diagnosis not present

## 2013-09-21 DIAGNOSIS — Z880 Allergy status to penicillin: Secondary | ICD-10-CM | POA: Insufficient documentation

## 2013-09-21 HISTORY — DX: Dorsalgia, unspecified: M54.9

## 2013-09-21 HISTORY — PX: COLONOSCOPY WITH PROPOFOL: SHX5780

## 2013-09-21 HISTORY — DX: Type 2 diabetes mellitus without complications: E11.9

## 2013-09-21 HISTORY — DX: Nontoxic goiter, unspecified: E04.9

## 2013-09-21 LAB — GLUCOSE, CAPILLARY: Glucose-Capillary: 172 mg/dL — ABNORMAL HIGH (ref 70–99)

## 2013-09-21 SURGERY — COLONOSCOPY WITH PROPOFOL
Anesthesia: Monitor Anesthesia Care

## 2013-09-21 MED ORDER — ONDANSETRON HCL 4 MG/2ML IJ SOLN
INTRAMUSCULAR | Status: AC
  Filled 2013-09-21: qty 2

## 2013-09-21 MED ORDER — LIDOCAINE HCL (CARDIAC) 20 MG/ML IV SOLN
INTRAVENOUS | Status: AC
  Filled 2013-09-21: qty 5

## 2013-09-21 MED ORDER — PROPOFOL 10 MG/ML IV BOLUS
INTRAVENOUS | Status: AC
  Filled 2013-09-21: qty 20

## 2013-09-21 MED ORDER — ONDANSETRON HCL 4 MG/2ML IJ SOLN
INTRAMUSCULAR | Status: DC | PRN
  Administered 2013-09-21: 4 mg via INTRAVENOUS

## 2013-09-21 MED ORDER — KETAMINE HCL 10 MG/ML IJ SOLN
INTRAMUSCULAR | Status: DC | PRN
  Administered 2013-09-21 (×2): 20 mg via INTRAVENOUS

## 2013-09-21 MED ORDER — PROPOFOL 10 MG/ML IV BOLUS
INTRAVENOUS | Status: DC | PRN
  Administered 2013-09-21: 40 mg via INTRAVENOUS
  Administered 2013-09-21: 10 mg via INTRAVENOUS

## 2013-09-21 MED ORDER — LIDOCAINE HCL (CARDIAC) 20 MG/ML IV SOLN
INTRAVENOUS | Status: DC | PRN
  Administered 2013-09-21: 100 mg via INTRAVENOUS

## 2013-09-21 MED ORDER — LACTATED RINGERS IV SOLN
INTRAVENOUS | Status: DC
  Administered 2013-09-21: 14:00:00 via INTRAVENOUS

## 2013-09-21 MED ORDER — PROPOFOL INFUSION 10 MG/ML OPTIME
INTRAVENOUS | Status: DC | PRN
  Administered 2013-09-21: 100 ug/kg/min via INTRAVENOUS

## 2013-09-21 MED ORDER — SODIUM CHLORIDE 0.9 % IV SOLN: INTRAVENOUS | Status: DC

## 2013-09-21 SURGICAL SUPPLY — 22 items

## 2013-09-21 NOTE — H&P (Signed)
Amy Rosario is an 53 y.o. female.   Chief Complaint: Colorectal cancer screening. HPI: Patient is here for a colonoscopy. See office notes for details.   Past Medical History  Diagnosis Date  . Hyperlipidemia   . Impaired fasting blood sugar 2014  . Hypertension 2000  . Other screening mammogram     planned for 05/2013  . Routine gynecological examination     last pap 2012  . Morbid obesity BMI > 50   . H/O echocardiogram     Dr. Charolette [facility], [facility]  . Diabetes mellitus without complication   . Goiter     left side, saw dr tysinger april 2015 for, had biopsy done was told it is cyst  . Back pain   . Sleep apnea     on CPAP setting of 2   Past Surgical History  Procedure Laterality Date  . Cyst excision      right low back  . Tubal ligation     Family History  Problem Relation Age of Onset  . Heart disease Father   . Hypertension Father   . Cancer Neg Hx   . Stroke Neg Hx   . Diabetes Neg Hx   . Heart disease Brother    Social History:  reports that she has never smoked. She has never used smokeless tobacco. She reports that she does not drink alcohol or use illicit drugs.  Allergies:  Allergies  Allergen Reactions  . Penicillins Hives   Medications Prior to Admission  Medication Sig Dispense Refill  . Amlodipine-Valsartan-HCTZ (EXFORGE HCT) 10-320-25 MG TABS Take 1 tablet by mouth daily.        Marland Kitchen aspirin 81 MG tablet Take 81 mg by mouth daily.        . cloNIDine (CATAPRES) 0.2 MG tablet Take 0.2 mg by mouth every evening.       Marland Kitchen glimepiride (AMARYL) 2 MG tablet Take 2 mg by mouth daily with breakfast.      . Insulin Detemir (LEVEMIR FLEXPEN) 100 UNIT/ML Pen Inject 10 Units into the skin daily at 10 pm.  15 mL  5  . Insulin Pen Needle (PEN NEEDLES) 31G X 6 MM MISC Use daily on your levemir flexpen  100 each  1  . metformin (FORTAMET) 1000 MG (OSM) 24 hr tablet Take 1,000 mg by mouth 2 (two) times daily.      . nebivolol (BYSTOLIC) 10  MG tablet Take 10 mg by mouth every evening.       . rosuvastatin (CRESTOR) 10 MG tablet Take 10 mg by mouth daily.       Results for orders placed during the hospital encounter of 09/21/13 (from the past 48 hour(s))  GLUCOSE, CAPILLARY     Status: Abnormal   Collection Time    09/21/13  2:07 PM      Result Value Ref Range   Glucose-Capillary 172 (*) 70 - 99 mg/dL   No results found.  Review of Systems  Constitutional: Negative.   HENT: Negative.   Eyes: Negative.   Respiratory: Negative.   Cardiovascular: Negative.   Gastrointestinal: Negative.   Genitourinary: Negative.   Musculoskeletal: Positive for joint pain.  Psychiatric/Behavioral: Negative.     Blood pressure 195/72, pulse 73, temperature 98.6 F (37 C), temperature source Oral, resp. rate 16, height 5\' 8"  (1.727 m), weight 151.048 kg (333 lb), last menstrual period 09/12/2010, SpO2 98.00%. Physical Exam  Constitutional: She is oriented to person, place, and time. She  appears well-developed and well-nourished.  Morbidly obese  HENT:  Head: Normocephalic and atraumatic.  Eyes: Conjunctivae and EOM are normal. Pupils are equal, round, and reactive to light.  Neck: Normal range of motion. Neck supple.  Cardiovascular: Normal rate and regular rhythm.   Respiratory: Effort normal and breath sounds normal.  GI: Soft. Bowel sounds are normal.  Musculoskeletal: Normal range of motion.  Neurological: She is alert and oriented to person, place, and time.  Skin: Skin is warm and dry.  Psychiatric: She has a normal mood and affect. Her behavior is normal. Judgment and thought content normal.    Assessment/Plan Colorectal cancer screening: proceed with a colonoscopy at this time .  Deklen Popelka 09/21/2013, 2:16 PM

## 2013-09-21 NOTE — Discharge Instructions (Signed)

## 2013-09-21 NOTE — Anesthesia Preprocedure Evaluation (Addendum)
Anesthesia Evaluation  Patient identified by MRN, date of birth, ID band Patient awake    Reviewed: Allergy & Precautions, H&P , NPO status , Patient's Chart, lab work & pertinent test results  Airway Mallampati: III TM Distance: <3 FB Neck ROM: Full    Dental no notable dental hx.    Pulmonary sleep apnea and Continuous Positive Airway Pressure Ventilation ,  breath sounds clear to auscultation  + decreased breath sounds      Cardiovascular hypertension, Pt. on medications Rhythm:Regular Rate:Normal     Neuro/Psych negative neurological ROS  negative psych ROS   GI/Hepatic negative GI ROS, Neg liver ROS,   Endo/Other  diabetes, Insulin DependentMorbid obesity  Renal/GU negative Renal ROS  negative genitourinary   Musculoskeletal negative musculoskeletal ROS (+)   Abdominal   Peds negative pediatric ROS (+)  Hematology negative hematology ROS (+)   Anesthesia Other Findings   Reproductive/Obstetrics negative OB ROS                          Anesthesia Physical Anesthesia Plan  ASA: III  Anesthesia Plan: MAC   Post-op Pain Management:    Induction: Intravenous  Airway Management Planned: Nasal Cannula  Additional Equipment:   Intra-op Plan:   Post-operative Plan:   Informed Consent: I have reviewed the patients History and Physical, chart, labs and discussed the procedure including the risks, benefits and alternatives for the proposed anesthesia with the patient or authorized representative who has indicated his/her understanding and acceptance.   Dental advisory given  Plan Discussed with: CRNA and Surgeon  Anesthesia Plan Comments:         Anesthesia Quick Evaluation

## 2013-09-21 NOTE — Op Note (Signed)
[facility] [place], [address]   OPERATIVE PROCEDURE REPORT  PATIENT: Amy Rosario, Amy Rosario  MR#: [id number] BIRTHDATE: 04-Jan-1961 GENDER: Female ENDOSCOPIST: Edmonia James, MD ASSISTANT:   Sharon Mt, Endo Technician Luanne Bras, RN CGRN PROCEDURE DATE: 09/21/2013 PRE-PROCEDURE PREPARATION: The patient was prepped with a gallon of Golytely the night prior to the procedure.  The patient was fasted for 8 hours prior to the procedure.  PRE-PROCEDURE PHYSICAL: Patient has stable vital signs.  Neck is supple.  There is no JVD, thyromegaly or LAD.  Chest clear to auscultation.  S1 and S2 regular.  Abdomen soft, morbidly obese, non-distended, non-tender with NABS. PROCEDURE:     Colonoscopy, diagnostic ASA CLASS:     Class III INDICATIONS:     1.  Colorectal cancer screening. MEDICATIONS:     Diprivan 250mg  IV  DESCRIPTION OF PROCEDURE: After the risks, benefits, and alternatives of the procedure were thoroughly explained [including a 10% missed rate of cancer and polyps], informed consent was obtained.  Digital rectal exam was performed.  The Pentax Adult Colonscope [id number]  was introduced through the anus  and advanced to the cecum, which was identified by both the appendix and ileocecal valve , limited by No adverse events experienced.  The quality of the prep was good. . Multiple washes were done. Small lesions could be missed. The instrument was then slowly withdrawn as the colon was fully examined.     COLON FINDINGS: A normal appearing cecum, ileocecal valve, and appendiceal orifice were identified.  The ascending, hepatic flexure, transverse, splenic flexure, descending, sigmoid colon and rectum appeared unremarkable.  No polyps or cancers were seen. The entire colonic mucosa appeared healthy with a normal vascular pattern.  No masses, polyps, diverticula or AVMs were noted.  The appendiceal orifice and the ICV were identified and  photographed. The terminal ileum appeared normal.  Retroflexed views revealed no abnormalities.  The patient tolerated the procedure without immediate complications.  The scope was then withdrawn from the patient and the procedure terminated.  TIME TO CECUM:   5 minutes 00 seconds WITHDRAW TIME:  6 minutes 00 seconds   IMPRESSION:     Normal colonoscopy upto the cecum.   RECOMMENDATIONS:     1.  Continue current medications. 2.  Continue surveillance. 3.  High fiber diet with liberal fluid intake. 4.  OP follow-up is advised on a PRN basis.   REPEAT EXAM:      In 10 years  for a repeat colonoscopy.  If the patient has any abnormal GI symptoms in the interim, she have been advised to contact the office as soon as possible for further recommendations.   CPT CODES:     376.5, Colorectal Screening   DIAGNOSIS CODES:     V76.51 Colorectal cancer screening   REFERRED NT:IRWER Terrence Dupont, M.D.  Dorothea Ogle, PA-C  eSigned:  Dr. Edmonia James, MD 09/21/2013 3:14 PM   PATIENT NAME:  Amy Rosario, Amy Rosario MR#: [id number]

## 2013-09-21 NOTE — Transfer of Care (Signed)
Immediate Anesthesia Transfer of Care Note  Patient: Amy Rosario  Procedure(s) Performed: Procedure(s) (LRB): COLONOSCOPY WITH PROPOFOL (N/A)  Patient Location: PACU  Anesthesia Type: MAC  Level of Consciousness: sedated, patient cooperative and responds to stimulation  Airway & Oxygen Therapy: Patient Spontanous Breathing and Patient connected to face mask oxgen  Post-op Assessment: Report given to PACU RN and Post -op Vital signs reviewed and stable  Post vital signs: Reviewed and stable  Complications: No apparent anesthesia complications

## 2013-09-22 ENCOUNTER — Encounter (HOSPITAL_COMMUNITY): Payer: Self-pay | Admitting: Gastroenterology

## 2013-09-22 NOTE — Anesthesia Postprocedure Evaluation (Signed)
  Anesthesia Post-op Note  Patient: Amy Rosario  Procedure(s) Performed: Procedure(s) (LRB): COLONOSCOPY WITH PROPOFOL (N/A)  Patient Location: PACU  Anesthesia Type: MAC  Level of Consciousness: awake and alert   Airway and Oxygen Therapy: Patient Spontanous Breathing  Post-op Pain: mild  Post-op Assessment: Post-op Vital signs reviewed, Patient's Cardiovascular Status Stable, Respiratory Function Stable, Patent Airway and No signs of Nausea or vomiting  Last Vitals:  Filed Vitals:   09/21/13 1540  BP:   Pulse: 63  Temp:   Resp: 21    Post-op Vital Signs: stable   Complications: No apparent anesthesia complications

## 2013-11-19 ENCOUNTER — Encounter: Payer: Self-pay | Admitting: Medical

## 2013-11-19 ENCOUNTER — Ambulatory Visit (INDEPENDENT_AMBULATORY_CARE_PROVIDER_SITE_OTHER): Payer: BC Managed Care – PPO | Admitting: Medical

## 2013-11-19 ENCOUNTER — Other Ambulatory Visit: Payer: Self-pay | Admitting: Family Medicine

## 2013-11-19 ENCOUNTER — Telehealth: Payer: Self-pay | Admitting: Medical

## 2013-11-19 VITALS — BP 150/102 | HR 86 | Temp 98.1°F | Resp 17 | Wt 335.0 lb

## 2013-11-19 DIAGNOSIS — Z23 Encounter for immunization: Secondary | ICD-10-CM

## 2013-11-19 DIAGNOSIS — E669 Obesity, unspecified: Secondary | ICD-10-CM

## 2013-11-19 DIAGNOSIS — E119 Type 2 diabetes mellitus without complications: Secondary | ICD-10-CM

## 2013-11-19 DIAGNOSIS — I10 Essential (primary) hypertension: Secondary | ICD-10-CM

## 2013-11-19 LAB — POCT URINALYSIS DIPSTICK
BILIRUBIN UA: NEGATIVE
Blood, UA: NEGATIVE
GLUCOSE UA: NEGATIVE
Ketones, UA: NEGATIVE
Leukocytes, UA: NEGATIVE
Nitrite, UA: NEGATIVE
Protein, UA: NEGATIVE
SPEC GRAV UA: 1.01
Urobilinogen, UA: NEGATIVE
pH, UA: 6.5

## 2013-11-19 LAB — COMPREHENSIVE METABOLIC PANEL
ALBUMIN: 4.1 g/dL (ref 3.5–5.2)
ALK PHOS: 63 U/L (ref 39–117)
ALT: 23 U/L (ref 0–35)
AST: 25 U/L (ref 0–37)
BILIRUBIN TOTAL: 0.3 mg/dL (ref 0.2–1.2)
BUN: 16 mg/dL (ref 6–23)
CO2: 28 mEq/L (ref 19–32)
Calcium: 9.7 mg/dL (ref 8.4–10.5)
Chloride: 100 mEq/L (ref 96–112)
Creat: 0.84 mg/dL (ref 0.50–1.10)
Glucose, Bld: 157 mg/dL — ABNORMAL HIGH (ref 70–99)
POTASSIUM: 3.7 meq/L (ref 3.5–5.3)
SODIUM: 139 meq/L (ref 135–145)
TOTAL PROTEIN: 7.3 g/dL (ref 6.0–8.3)

## 2013-11-19 LAB — POCT GLYCOSYLATED HEMOGLOBIN (HGB A1C): HEMOGLOBIN A1C: 9.2

## 2013-11-19 MED ORDER — ROSUVASTATIN CALCIUM 10 MG PO TABS: 10.0000 mg | ORAL_TABLET | Freq: Every day | ORAL | Status: DC

## 2013-11-19 MED ORDER — NEBIVOLOL HCL 20 MG PO TABS: 1.0000 | ORAL_TABLET | Freq: Every day | ORAL | Status: DC

## 2013-11-19 MED ORDER — AMLODIPINE-VALSARTAN-HCTZ 10-320-25 MG PO TABS: 1.0000 | ORAL_TABLET | Freq: Every day | ORAL | Status: DC

## 2013-11-19 MED ORDER — METFORMIN HCL ER (OSM) 1000 MG PO TB24: 1000.0000 mg | ORAL_TABLET | Freq: Two times a day (BID) | ORAL | Status: DC

## 2013-11-19 MED ORDER — CANAGLIFLOZIN 100 MG PO TABS: 1.0000 | ORAL_TABLET | Freq: Every day | ORAL | Status: DC

## 2013-11-19 MED ORDER — INSULIN DETEMIR 100 UNIT/ML FLEXPEN: 20.0000 [IU] | PEN_INJECTOR | Freq: Every day | SUBCUTANEOUS | Status: DC

## 2013-11-19 MED ORDER — ASPIRIN 81 MG PO TABS: 81.0000 mg | ORAL_TABLET | Freq: Every day | ORAL | Status: DC

## 2013-11-19 MED ORDER — CLONIDINE HCL 0.2 MG PO TABS: 0.2000 mg | ORAL_TABLET | Freq: Every day | ORAL | Status: DC

## 2013-11-19 NOTE — Addendum Note (Signed)
Addended by: Louie Bun on: 11/19/2013 03:20 PM   Modules accepted: Orders

## 2013-11-19 NOTE — Progress Notes (Signed)
Subjective: Here for f/u on diabets, blood pressure, medications.   She notes eating ok, but does eat cookies and cake several times per week.  Walking some for exercise.  Not checking feet ever day.   Glucose readings range from mid 100s to mid 200s fasting.  She is compliant with medication changes we discussed last visit.  Has some burning sensation of left knee at times, none today.  No recent injury or trauma or fall.  Does drink soda at least 1 daily.    No period since 2014, recent had bleeding.   Hasn't callled gyn yet.  Last eye doctor  Visit > 1 year ago.  No other c/o.    Past Medical History  Diagnosis Date  . Hyperlipidemia   . Impaired fasting blood sugar 2014  . Hypertension 2000  . Other screening mammogram     planned for 05/2013  . Routine gynecological examination     last pap 2012  . Obesity   . H/O echocardiogram     Dr. Charolette Forward, [facility]  . Diabetes mellitus without complication   . Goiter     left side, saw dr Candela Krul april 2015 for, had biopsy done was told it is cyst  . Back pain   . Sleep apnea     on CPAP setting of 2    Objective: BP 150/102  Pulse 86  Temp(Src) 98.1 F (36.7 C) (Oral)  Resp 17  Wt 335 lb (151.955 kg)  LMP 11/09/2013  Wt Readings from Last 3 Encounters:  11/19/13 335 lb (151.955 kg)  09/21/13 333 lb (151.048 kg)  09/21/13 333 lb (151.048 kg)   Gen:wd, wn, nad Heart: RRR, normal S1, S2 ,no murmur Lungs clear Ext: no edema Pulses 1+ pedal monofilament exam normal    Assessment: Encounter Diagnoses  Name Primary?  . Type 2 diabetes mellitus without complication Yes  . Essential hypertension   . Obesity   . Need for prophylactic vaccination and inoculation against influenza     Plan: HgbA1C 9.3% today, not much improved from last visit.  Discussed medications changes, labs today, discussed need for better diet and exercise changes.  Referral to dietician.  Counseled on the influenza virus  vaccine.  Vaccine information sheet given.  Influenza vaccine given after consent obtained.  She will defer pneumococcal vaccine til next visit.  Patient Instructions  For diabetes:  See your eye doctor now and yearly.  Please schedule an appointment  STOP Glimepiride/Amaryl  For the next week, increase night time Levemir to 15 units nightly.  After 1 week, increase to 20 units nightly  Continue Metformin 1000mg  twice daily tablet  Begin once daily tablet Invokana 100mg  daily  Make healthier diet changes  Exercise daily with walking  We will call with lab results  Continue all of your blood pressure medications as usual, except I increased the dose of Bystolic to 20mg  daily.  Call your gynecologist about the period/bleeding. [address] # [place], [place], [place] [address] [phone]

## 2013-11-19 NOTE — Telephone Encounter (Signed)
pls set up visit with dietician

## 2013-11-19 NOTE — Patient Instructions (Addendum)
For diabetes:  See your eye doctor now and yearly.  Please schedule an appointment  STOP Glimepiride/Amaryl  For the next week, increase night time Levemir to 15 units nightly.  After 1 week, increase to 20 units nightly  Continue Metformin 1000mg  twice daily tablet  Begin once daily tablet Invokana 100mg  daily  Make healthier diet changes  Exercise daily with walking  We will call with lab results  Continue all of your blood pressure medications as usual, except I increased the dose of Bystolic to 20mg  daily.  Call your gynecologist about the period/bleeding. [address] Amy [place], [place], [place] [address] [phone]

## 2013-11-19 NOTE — Telephone Encounter (Signed)
Orders are in EPIC and they will call her for her appointment

## 2013-11-20 ENCOUNTER — Telehealth: Payer: Self-pay | Admitting: Internal Medicine

## 2013-11-20 LAB — MICROALBUMIN / CREATININE URINE RATIO
CREATININE, URINE: 108.9 mg/dL
MICROALB UR: 1.8 mg/dL (ref <2.0)
Microalb Creat Ratio: 16.5 mg/g (ref 0.0–30.0)

## 2013-11-20 NOTE — Telephone Encounter (Signed)
Pharmacy sent a fax over stating that bystolic 20mg  is no longer available and wants to know if you can change to 10mg  #60 2 daily. Send new rx into pharmacy

## 2013-11-23 ENCOUNTER — Other Ambulatory Visit: Payer: Self-pay | Admitting: Medical

## 2013-11-23 MED ORDER — ATENOLOL 100 MG PO TABS: 100.0000 mg | ORAL_TABLET | Freq: Every day | ORAL | Status: DC

## 2013-11-23 NOTE — Telephone Encounter (Signed)
Patient is aware of Dorothea Ogle PAc message and the change in her medication

## 2013-11-23 NOTE — Telephone Encounter (Signed)
Instead of making her take 2 tablets daily of the Bystolic to equal 20mg , lets just change to Atenolol 100mg  daily which should be about the same..  C/t all other medications as usual. Have her finish out what she has left of the Bystolic 10mg  but taking 2 daily.

## 2013-12-07 ENCOUNTER — Encounter: Payer: Self-pay | Admitting: Medical

## 2014-02-08 ENCOUNTER — Telehealth: Payer: Self-pay | Admitting: Medical

## 2014-02-08 NOTE — Telephone Encounter (Signed)
Pt needs refill on her 31 g x6 pen needles to [facility]

## 2014-02-09 ENCOUNTER — Other Ambulatory Visit: Payer: Self-pay | Admitting: Family Medicine

## 2014-02-09 MED ORDER — PEN NEEDLES 31G X 6 MM MISC: Status: DC

## 2014-02-09 NOTE — Telephone Encounter (Signed)
Rx for needle refills was sent to her pharmacy

## 2014-07-19 ENCOUNTER — Encounter: Payer: Self-pay | Admitting: Medical

## 2014-07-19 ENCOUNTER — Ambulatory Visit (INDEPENDENT_AMBULATORY_CARE_PROVIDER_SITE_OTHER): Payer: BC Managed Care – PPO | Admitting: Medical

## 2014-07-19 ENCOUNTER — Telehealth: Payer: Self-pay | Admitting: Medical

## 2014-07-19 VITALS — BP 148/110 | HR 99 | Temp 98.2°F | Resp 15 | Wt 310.0 lb

## 2014-07-19 DIAGNOSIS — E1165 Type 2 diabetes mellitus with hyperglycemia: Secondary | ICD-10-CM

## 2014-07-19 DIAGNOSIS — IMO0002 Reserved for concepts with insufficient information to code with codable children: Secondary | ICD-10-CM

## 2014-07-19 DIAGNOSIS — E669 Obesity, unspecified: Secondary | ICD-10-CM

## 2014-07-19 DIAGNOSIS — I10 Essential (primary) hypertension: Secondary | ICD-10-CM | POA: Diagnosis not present

## 2014-07-19 DIAGNOSIS — E785 Hyperlipidemia, unspecified: Secondary | ICD-10-CM

## 2014-07-19 LAB — COMPREHENSIVE METABOLIC PANEL
ALT: 17 U/L (ref 0–35)
AST: 16 U/L (ref 0–37)
Albumin: 4.3 g/dL (ref 3.5–5.2)
Alkaline Phosphatase: 73 U/L (ref 39–117)
BILIRUBIN TOTAL: 0.4 mg/dL (ref 0.2–1.2)
BUN: 14 mg/dL (ref 6–23)
CO2: 30 mEq/L (ref 19–32)
CREATININE: 0.8 mg/dL (ref 0.50–1.10)
Calcium: 9.6 mg/dL (ref 8.4–10.5)
Chloride: 99 mEq/L (ref 96–112)
GLUCOSE: 235 mg/dL — AB (ref 70–99)
Potassium: 3.4 mEq/L — ABNORMAL LOW (ref 3.5–5.3)
Sodium: 141 mEq/L (ref 135–145)
TOTAL PROTEIN: 7.7 g/dL (ref 6.0–8.3)

## 2014-07-19 LAB — POCT GLYCOSYLATED HEMOGLOBIN (HGB A1C): Hemoglobin A1C: 9.8

## 2014-07-19 LAB — LIPID PANEL
Cholesterol: 271 mg/dL — ABNORMAL HIGH (ref 0–200)
HDL: 48 mg/dL (ref >=46)
LDL Cholesterol: 161 mg/dL — ABNORMAL HIGH (ref 0–99)
TRIGLYCERIDES: 309 mg/dL — AB (ref <150)
Total CHOL/HDL Ratio: 5.6 Ratio
VLDL: 62 mg/dL — ABNORMAL HIGH (ref 0–40)

## 2014-07-19 MED ORDER — AMLODIPINE-VALSARTAN-HCTZ 10-320-25 MG PO TABS: 1.0000 | ORAL_TABLET | Freq: Every day | ORAL | Status: DC

## 2014-07-19 MED ORDER — CLONIDINE HCL 0.2 MG PO TABS: 0.2000 mg | ORAL_TABLET | Freq: Two times a day (BID) | ORAL | Status: DC

## 2014-07-19 MED ORDER — ATENOLOL 100 MG PO TABS: 100.0000 mg | ORAL_TABLET | Freq: Every day | ORAL | Status: DC

## 2014-07-19 MED ORDER — INSULIN DETEMIR 100 UNIT/ML FLEXPEN: 30.0000 [IU] | PEN_INJECTOR | Freq: Every day | SUBCUTANEOUS | Status: DC

## 2014-07-19 MED ORDER — CANAGLIFLOZIN-METFORMIN HCL 150-1000 MG PO TABS: 1.0000 | ORAL_TABLET | Freq: Two times a day (BID) | ORAL | Status: DC

## 2014-07-19 NOTE — Progress Notes (Signed)
  Subjective:   Amy Rosario is an 54 y.o. female who presents for follow up of Type 2 diabetes mellitus and chronic issues   Patient is checking home blood sugars.   Home blood sugar records: CHECKS SOMETIME BUT SUGAR WAS 180 THIS MORNING], numbers in the mid to high 100s. Current symptoms include: none. Patient denies NONE.  Patient is checking their feet daily. Foot concerns (callous, ulcer, wound, thickened nails, toenail fungus, skin fungus, hammer toe): NO CONCERNS Last dilated eye exam NEVER  Current treatments: compliant with invokana, metformin, levemir 20 u QHS. Medication compliance: good  Current diet: in general, a "healthy" diet   Current exercise: bicycling and walking Known diabetic complications: none  Not checking BPs but is compliant with medications, not using CPAP as it is broken.  Never saw dietician after last visit  The following portions of the patient's history were reviewed and updated as appropriate: allergies, current medications, past family history, past medical history, past social history, past surgical history and problem list.  ROS as in subjective above    Objective:   BP 148/110 mmHg  Pulse 99  Temp(Src) 98.2 F (36.8 C) (Oral)  Resp 15  Wt 310 lb (140.615 kg)  LMP 11/09/2013  BP Readings from Last 3 Encounters:  07/19/14 148/110  11/19/13 150/102  09/21/13 175/90   Wt Readings from Last 3 Encounters:  07/19/14 310 lb (140.615 kg)  11/19/13 335 lb (151.955 kg)  09/21/13 333 lb (151.048 kg)   Gen: wd, wn, nad Heart RRR normal s1, s2 , no murmurs Lungs clear Ext: no edema Pulses normal     Assessment:   Encounter Diagnoses  Name Primary?  . Diabetes type 2, uncontrolled Yes  . Hyperlipidemia   . Essential hypertension, benign   . Obesity      Plan:   HgbA1C worse than last despite additional medication and weight loss.  I suspect her medication compliance is not as good as she is reporting, as well as less than  desirable compliance with diet and exercise.   She has actually lost some weight though.    hyperlipidemia - labs today, c/t Crestor  HTN - needs to get CPAP machine repaired/replaced.  Advised she contact the supplier listed on her device or let us know so we can help.  C/t same medications.  Pending labs, additional recommendations will be discussed.  Obesity - needs to work harder on weigh loss efforts.

## 2014-07-19 NOTE — Telephone Encounter (Signed)
Please call and apologize for her being left in the room yesterday afternoon.  This was a miscommunication on our part and should not have happened!  Please refer to dietician . She states that after her last visit 11/2013, she never heard from [place] or [facility] about dietician/nutrition counseling.  See if you can figure out what went wrong.  We ordered in Epic, but either nutritionist didn't contact her or maybe phone was not working number, not sure?  See separate message about labs  Call out the smallest Levemir needle available. Check with pharmacist.  She says current needles are still quite big.    Have her let you know her CPAP supplier so we can call or have her call to inspect her CPAP as she says it is not working properly but was having a hard time getting the supplier on the phone.

## 2014-07-20 ENCOUNTER — Other Ambulatory Visit: Payer: Self-pay | Admitting: Medical

## 2014-07-20 ENCOUNTER — Other Ambulatory Visit: Payer: Self-pay | Admitting: Family Medicine

## 2014-07-20 DIAGNOSIS — E669 Obesity, unspecified: Secondary | ICD-10-CM

## 2014-07-20 MED ORDER — ROSUVASTATIN CALCIUM 20 MG PO TABS: 20.0000 mg | ORAL_TABLET | Freq: Every day | ORAL | Status: DC

## 2014-07-20 MED ORDER — INSULIN PEN NEEDLE 32G X 4 MM MISC: 32.0000 g | Freq: Two times a day (BID) | Status: DC

## 2014-07-20 MED ORDER — POTASSIUM CHLORIDE ER 10 MEQ PO TBCR: 10.0000 meq | EXTENDED_RELEASE_TABLET | Freq: Every day | ORAL | Status: DC

## 2014-07-20 NOTE — Telephone Encounter (Signed)
I went over [name] PA message in full detail. I called her pharmacy and ordered the patient a smaller needle per Chana Bode PA I ordered her referral to the nutritionists in [facility] and they will contact the patient by phone

## 2014-07-20 NOTE — Patient Instructions (Signed)
Thanks for coming in for follow up.  I am glad to see you lost some weight.    I want to make some changes in your medications and recommendations:  Diabetes:  Check your sugars before meals over the next few weeks to get a better idea of where your numbers are running  The goal is to have the morning glucose < 130  INCREASE Levemir to 25 units nightly for 1 week, then INCREASE to 30 units nightly ongoing  STOP separate Invokana and Metformin  CHANGE to Invokamet 150/1000mg  twice daily.  This will replace separate Invokana and Metformin  Go see an eye doctor for routine screening and for diabetic retinopathy screening.   Check your feet daily for wounds/sores and let us know if any problems  Eye doctors recommended: Dr. Webb Laws [place], [place],  [address] [phone]  [facility] Dr. Camillo Flaming [address], [place],  [address]  [email or web address]   High Blood Pressure  Continue Atenolol 100mg  once daily in the morning  Continue Exforge HCT once daily in the morning  Begin Potassium 72meq once daily in the morning as your potassium was a little low due to Exforge  INCREASE Clonidine 0.2mg  to twice daily  Call your CPAP supplier to have them work to repair/replace your machine as this also contributes to high blood pressure if you are not using it nightly  High Cholesterol  INCREASE Crestor to 20mg  daily at bedtime  Take a baby aspirin 81mg  daily at bedtime  Other recommendations  We are referring you to a dietician  Lets plan to recheck in 4-6 weeks on blood pressure and sugar control

## 2015-06-27 ENCOUNTER — Encounter: Payer: Self-pay | Admitting: Medical

## 2015-06-27 ENCOUNTER — Ambulatory Visit (INDEPENDENT_AMBULATORY_CARE_PROVIDER_SITE_OTHER): Payer: BC Managed Care – PPO | Admitting: Medical

## 2015-06-27 VITALS — BP 150/98 | HR 87 | Wt 291.0 lb

## 2015-06-27 DIAGNOSIS — Z794 Long term (current) use of insulin: Secondary | ICD-10-CM | POA: Diagnosis not present

## 2015-06-27 DIAGNOSIS — Z9989 Dependence on other enabling machines and devices: Secondary | ICD-10-CM

## 2015-06-27 DIAGNOSIS — E785 Hyperlipidemia, unspecified: Secondary | ICD-10-CM

## 2015-06-27 DIAGNOSIS — G4733 Obstructive sleep apnea (adult) (pediatric): Secondary | ICD-10-CM | POA: Insufficient documentation

## 2015-06-27 DIAGNOSIS — E669 Obesity, unspecified: Secondary | ICD-10-CM | POA: Diagnosis not present

## 2015-06-27 DIAGNOSIS — E049 Nontoxic goiter, unspecified: Secondary | ICD-10-CM | POA: Diagnosis not present

## 2015-06-27 DIAGNOSIS — E1165 Type 2 diabetes mellitus with hyperglycemia: Secondary | ICD-10-CM | POA: Insufficient documentation

## 2015-06-27 DIAGNOSIS — I10 Essential (primary) hypertension: Secondary | ICD-10-CM

## 2015-06-27 DIAGNOSIS — E118 Type 2 diabetes mellitus with unspecified complications: Secondary | ICD-10-CM | POA: Diagnosis not present

## 2015-06-27 LAB — COMPREHENSIVE METABOLIC PANEL
ALK PHOS: 89 U/L (ref 33–130)
ALT: 27 U/L (ref 6–29)
AST: 23 U/L (ref 10–35)
Albumin: 4.1 g/dL (ref 3.6–5.1)
BUN: 15 mg/dL (ref 7–25)
CALCIUM: 10 mg/dL (ref 8.6–10.4)
CO2: 30 mmol/L (ref 20–31)
Chloride: 98 mmol/L (ref 98–110)
Creat: 0.85 mg/dL (ref 0.50–1.05)
GLUCOSE: 260 mg/dL — AB (ref 65–99)
Potassium: 3.8 mmol/L (ref 3.5–5.3)
Sodium: 138 mmol/L (ref 135–146)
Total Bilirubin: 0.6 mg/dL (ref 0.2–1.2)
Total Protein: 7.7 g/dL (ref 6.1–8.1)

## 2015-06-27 LAB — TSH: TSH: 2.56 m[IU]/L

## 2015-06-27 LAB — CBC
HEMATOCRIT: 44.1 % (ref 35.0–45.0)
Hemoglobin: 14.6 g/dL (ref 11.7–15.5)
MCH: 27.8 pg (ref 27.0–33.0)
MCHC: 33.1 g/dL (ref 32.0–36.0)
MCV: 84 fL (ref 80.0–100.0)
MPV: 10.8 fL (ref 7.5–12.5)
Platelets: 214 10*3/uL (ref 140–400)
RBC: 5.25 MIL/uL — ABNORMAL HIGH (ref 3.80–5.10)
RDW: 13.5 % (ref 11.0–15.0)
WBC: 7.3 10*3/uL (ref 4.0–10.5)

## 2015-06-27 LAB — LIPID PANEL
Cholesterol: 182 mg/dL (ref 125–200)
HDL: 44 mg/dL — AB (ref >=46)
LDL Cholesterol: 102 mg/dL (ref <130)
Total CHOL/HDL Ratio: 4.1 Ratio (ref <=5.0)
Triglycerides: 180 mg/dL — ABNORMAL HIGH (ref <150)
VLDL: 36 mg/dL — ABNORMAL HIGH (ref <30)

## 2015-06-27 LAB — T4, FREE: Free T4: 1 ng/dL (ref 0.8–1.8)

## 2015-06-27 NOTE — Progress Notes (Signed)
Subjective: Chief Complaint  Patient presents with  . Diabetes    sugar was 192 yesterday at 1pm. doesnt check sugars daily. no problems or concerns   Here for f/u on medications, chronic issues.  Last visit almost a year ago for some reason.   Is past due for f/u.  Checks sugars few times per week.   Been seeing 140-sometimes over 200.   Compliant with all medications but ran out of levemir recently.  Doesn't check BP regularly.   Ran out of Levemir a week ago.   Was using 30 u daily QHS.  Diet - pretty good currently, exercising some.  No recent foot lesions.  No chest pain, no SOB, no edema. Is fasting today.   Using CPAP.   No other health care visits since last visit here.  No other c/o.    Past Medical History  Diagnosis Date  . Hyperlipidemia   . Impaired fasting blood sugar 2014  . Hypertension 2000  . Other screening mammogram     planned for 05/2013  . Routine gynecological examination     last pap 2012  . Obesity   . H/O echocardiogram     Dr. Charolette [facility], [facility]  . Diabetes mellitus without complication ([place])   . Goiter     left side, saw dr tysinger april 2015 for, had biopsy done was told it is cyst  . Back pain   . Sleep apnea     on CPAP setting of 2   ROS as in subjective   Objective: BP 150/98 mmHg  Pulse 87  Wt 291 lb (131.997 kg)  LMP 11/09/2013  General appearance: alert, no distress, WD/WN Oral cavity: MMM, no lesions Neck: supple, no lymphadenopathy, generalized enlargement of thyroid, goiter, no masses Heart: RRR, normal S1, S2, no murmurs Lungs: CTA bilaterally, no wheezes, rhonchi, or rales Abdomen: +bs, soft, non tender, non distended, no masses, no hepatomegaly, no splenomegaly Pulses: 1+ symmetric, upper and lower extremities, normal cap refill Ext: no edema  Diabetic Foot Exam - Simple   Simple Foot Form  Visual Inspection  No deformities, no ulcerations, no other skin breakdown bilaterally:  Yes  Sensation  Testing  Intact to touch and monofilament testing bilaterally:  Yes  Pulse Check  See comments:  Yes  Comments  1+ pedal pulses       Assessment: Encounter Diagnoses  Name Primary?  . Type 2 diabetes mellitus with complication, with long-term current use of insulin ([place]) Yes  . Obesity   . Hyperlipidemia   . Essential hypertension, benign   . OSA on CPAP   . Goiter      Plan: Advised the need for routine f/u, q44mo, not yearly, particular in light of labs last visit and her overall health concerns.  C/t daily foot checks, yearly eye exam.   C/t current medications, needs to monitor BP and sugar more frequently.   Gave trial of 32 guage needle for Levemir.   C/t CPAP.    Ophie was seen today for diabetes.  Diagnoses and all orders for this visit:  Type 2 diabetes mellitus with complication, with long-term current use of insulin (HCC) -     Comprehensive metabolic panel -     Lipid panel -     CBC -     Hemoglobin A1c -     HM DIABETES EYE EXAM -     HM DIABETES FOOT EXAM -     Microalbumin / creatinine urine  ratio  Obesity -     Comprehensive metabolic panel -     Lipid panel -     CBC -     Hemoglobin A1c -     HM DIABETES EYE EXAM -     HM DIABETES FOOT EXAM -     Microalbumin / creatinine urine ratio  Hyperlipidemia -     Comprehensive metabolic panel -     Lipid panel -     CBC -     Hemoglobin A1c -     HM DIABETES EYE EXAM -     HM DIABETES FOOT EXAM -     Microalbumin / creatinine urine ratio  Essential hypertension, benign -     Comprehensive metabolic panel -     Lipid panel -     CBC -     Hemoglobin A1c -     HM DIABETES EYE EXAM -     HM DIABETES FOOT EXAM -     Microalbumin / creatinine urine ratio  OSA on CPAP -     Comprehensive metabolic panel -     Lipid panel -     CBC -     Hemoglobin A1c -     HM DIABETES EYE EXAM -     HM DIABETES FOOT EXAM -     Microalbumin / creatinine urine ratio  Goiter -     TSH -     T4,  free

## 2015-06-28 ENCOUNTER — Other Ambulatory Visit: Payer: Self-pay | Admitting: Medical

## 2015-06-28 LAB — MICROALBUMIN / CREATININE URINE RATIO
CREATININE, URINE: 170 mg/dL (ref 20–320)
MICROALB UR: 2.1 mg/dL
MICROALB/CREAT RATIO: 12 ug/mg{creat} (ref <30)

## 2015-06-28 LAB — HEMOGLOBIN A1C
Hgb A1c MFr Bld: 12 % — ABNORMAL HIGH (ref <5.7)
MEAN PLASMA GLUCOSE: 298 mg/dL

## 2015-06-28 MED ORDER — POTASSIUM CHLORIDE ER 10 MEQ PO TBCR: 10.0000 meq | EXTENDED_RELEASE_TABLET | Freq: Every day | ORAL | Status: DC

## 2015-06-28 MED ORDER — CANAGLIFLOZIN-METFORMIN HCL 150-1000 MG PO TABS: 1.0000 | ORAL_TABLET | Freq: Two times a day (BID) | ORAL | Status: DC

## 2015-06-28 MED ORDER — INSULIN PEN NEEDLE 32G X 4 MM MISC: 32.0000 g | Freq: Two times a day (BID) | Status: DC

## 2015-06-28 MED ORDER — CLONIDINE HCL 0.2 MG PO TABS: 0.2000 mg | ORAL_TABLET | Freq: Two times a day (BID) | ORAL | Status: DC

## 2015-06-28 MED ORDER — AMLODIPINE-VALSARTAN-HCTZ 10-320-25 MG PO TABS: 1.0000 | ORAL_TABLET | Freq: Every day | ORAL | Status: DC

## 2015-06-28 MED ORDER — ASPIRIN 81 MG PO TABS: 81.0000 mg | ORAL_TABLET | Freq: Every day | ORAL | Status: DC

## 2015-06-28 MED ORDER — ROSUVASTATIN CALCIUM 20 MG PO TABS: 20.0000 mg | ORAL_TABLET | Freq: Every day | ORAL | Status: DC

## 2015-06-28 MED ORDER — ATENOLOL 100 MG PO TABS: 100.0000 mg | ORAL_TABLET | Freq: Every day | ORAL | Status: DC

## 2015-06-28 MED ORDER — INSULIN ASPART PROT & ASPART (70-30 MIX) 100 UNIT/ML ~~LOC~~ SUSP: 10.0000 [IU] | Freq: Two times a day (BID) | SUBCUTANEOUS | Status: DC

## 2015-07-01 ENCOUNTER — Telehealth: Payer: Self-pay | Admitting: Medical

## 2015-07-01 ENCOUNTER — Telehealth: Payer: Self-pay

## 2015-07-01 DIAGNOSIS — E118 Type 2 diabetes mellitus with unspecified complications: Secondary | ICD-10-CM

## 2015-07-01 DIAGNOSIS — Z794 Long term (current) use of insulin: Secondary | ICD-10-CM

## 2015-07-01 DIAGNOSIS — E669 Obesity, unspecified: Secondary | ICD-10-CM

## 2015-07-01 NOTE — Telephone Encounter (Signed)
Nutrition referral.

## 2015-07-04 NOTE — Telephone Encounter (Signed)
Approved per Covermymeds.

## 2015-07-06 ENCOUNTER — Telehealth: Payer: Self-pay | Admitting: Internal Medicine

## 2015-07-06 MED ORDER — INSULIN ASPART PROT & ASPART (70-30 MIX) 100 UNIT/ML PEN: 10.0000 [IU] | PEN_INJECTOR | Freq: Two times a day (BID) | SUBCUTANEOUS | Status: DC

## 2015-07-06 NOTE — Telephone Encounter (Signed)
Sent in on 23rd of may

## 2015-07-06 NOTE — Telephone Encounter (Signed)
Please do pen needles

## 2015-07-11 NOTE — Telephone Encounter (Signed)
Called pt & advised approved & she states she picked it up and she got it for free

## 2015-08-02 ENCOUNTER — Encounter: Payer: Self-pay | Admitting: Skilled Nursing Facility1

## 2015-08-02 ENCOUNTER — Encounter: Payer: BC Managed Care – PPO | Attending: Medical | Admitting: Skilled Nursing Facility1

## 2015-08-02 VITALS — Ht 69.0 in | Wt 291.0 lb

## 2015-08-02 DIAGNOSIS — E118 Type 2 diabetes mellitus with unspecified complications: Secondary | ICD-10-CM | POA: Insufficient documentation

## 2015-08-02 DIAGNOSIS — Z794 Long term (current) use of insulin: Secondary | ICD-10-CM | POA: Insufficient documentation

## 2015-08-02 DIAGNOSIS — E669 Obesity, unspecified: Secondary | ICD-10-CM | POA: Diagnosis present

## 2015-08-02 DIAGNOSIS — E119 Type 2 diabetes mellitus without complications: Secondary | ICD-10-CM

## 2015-08-02 NOTE — Patient Instructions (Signed)
-  Call your insurance to know what glucometer is covered -Check your glucose 2 times a day (before you eat and 2 hours after you eat) -Avoid deep fried foods -Cook with olive oil -Try to be physically active throughout the week -Pay attention to your body and look out for signs of low blood sugar (page 21 of your book)

## 2015-08-02 NOTE — Progress Notes (Signed)
Diabetes Self-Management Education  Visit Type: First/Initial  Appt. Start Time: 3:00 Appt. End Time: 4:30  08/02/2015  [name], identified by name and date of birth, is a 55 y.o. female with a diagnosis of Diabetes: Type 2.   ASSESSMENT  Height 5\' 9"  (1.753 m), weight 291 lb (131.997 kg), last menstrual period 11/09/2013. Body mass index is 42.95 kg/(m^2). Pt states she injects her novolog before bed and in morning after she eats. Pt states she is Scared of diabetes due to pts niece having type 1 diabetes. Pt states she lives with her sister. Pt states she weighed 325 pounds last year and lost the weight due to cutting out soda.      Diabetes Self-Management Education - 08/02/15 1510    Visit Information   Visit Type First/Initial   Initial Visit   Diabetes Type Type 2   Are you currently following a meal plan? No   Are you taking your medications as prescribed? Yes   Date Diagnosed 2 years ago   Psychosocial Assessment   Patient Belief/Attitude about Diabetes Afraid   Self-care barriers None   Self-management support Family   Pre-Education Assessment   Patient understands the diabetes disease and treatment process. Needs Instruction   Patient understands incorporating nutritional management into lifestyle. Needs Instruction   Patient undertands incorporating physical activity into lifestyle. Needs Instruction   Patient understands using medications safely. Needs Instruction   Patient understands monitoring blood glucose, interpreting and using results Needs Instruction   Patient understands prevention, detection, and treatment of acute complications. Needs Instruction   Patient understands prevention, detection, and treatment of chronic complications. Needs Instruction   Patient understands how to develop strategies to address psychosocial issues. Needs Instruction   Patient understands how to develop strategies to promote health/change behavior. Needs Instruction    Complications   Last HgB A1C per patient/outside source 12 %   How often do you check your blood sugar? --  states her glucometer is malfuncting   Number of hyperglycemic episodes per week 7   Can you tell when your blood sugar is high? No   Have you had a dilated eye exam in the past 12 months? No   Have you had a dental exam in the past 12 months? No   Are you checking your feet? Yes   How many days per week are you checking your feet? 7   Dietary Intake   Breakfast egg and ham sandwich   Snack (morning) crackers   Lunch muffin and peanutbutter and jelly sandwich   Snack (afternoon) chips   Dinner fried chicken, lima beans, corn   Snack (evening) ice cream   Beverage(s) 2% milk, chocolate milk 1%, water   Exercise   Exercise Type ADL's   Patient Education   Previous Diabetes Education No   Disease state  Definition of diabetes, type 1 and 2, and the diagnosis of diabetes;Factors that contribute to the development of diabetes   Nutrition management  Role of diet in the treatment of diabetes and the relationship between the three main macronutrients and blood glucose level;Food label reading, portion sizes and measuring food.;Carbohydrate counting;Reviewed blood glucose goals for pre and post meals and how to evaluate the patients' food intake on their blood glucose level.;Information on hints to eating out and maintain blood glucose control.   Physical activity and exercise  Role of exercise on diabetes management, blood pressure control and cardiac health.;Identified with patient nutritional and/or medication changes necessary with exercise.  Monitoring Taught/evaluated SMBG meter.;Purpose and frequency of SMBG.;Yearly dilated eye exam;Daily foot exams;Identified appropriate SMBG and/or A1C goals.   Acute complications Taught treatment of hypoglycemia - the 15 rule.;Discussed and identified patients' treatment of hyperglycemia.   Chronic complications Lipid levels, blood glucose control  and heart disease;Dental care;Assessed and discussed foot care and prevention of foot problems;Retinopathy and reason for yearly dilated eye exams;Reviewed with patient heart disease, higher risk of, and prevention   Individualized Goals (developed by patient)   Nutrition Follow meal plan discussed;General guidelines for healthy choices and portions discussed;Adjust meds/carbs with exercise as discussed   Physical Activity Exercise 1-2 times per week;15 minutes per day   Monitoring  test my blood glucose as discussed;test blood glucose pre and post meals as discussed   Post-Education Assessment   Patient understands the diabetes disease and treatment process. Demonstrates understanding / competency   Patient understands incorporating nutritional management into lifestyle. Demonstrates understanding / competency   Patient undertands incorporating physical activity into lifestyle. Demonstrates understanding / competency   Patient understands using medications safely. Demonstrates understanding / competency   Patient understands monitoring blood glucose, interpreting and using results Demonstrates understanding / competency   Patient understands prevention, detection, and treatment of acute complications. Demonstrates understanding / competency   Patient understands prevention, detection, and treatment of chronic complications. Demonstrates understanding / competency   Patient understands how to develop strategies to address psychosocial issues. Demonstrates understanding / competency   Patient understands how to develop strategies to promote health/change behavior. Demonstrates understanding / competency   Outcomes   Expected Outcomes Demonstrated interest in learning. Expect positive outcomes   Future DMSE PRN   Program Status Completed      Individualized Plan for Diabetes Self-Management Training:   Learning Objective:  Patient will have a greater understanding of diabetes  self-management. Patient education plan is to attend individual and/or group sessions per assessed needs and concerns.   Plan:   Patient Instructions  -Call your insurance to know what glucometer is covered -Check your glucose 2 times a day (before you eat and 2 hours after you eat) -Avoid deep fried foods -Cook with olive oil -Try to be physically active throughout the week -Pay attention to your body and look out for signs of low blood sugar (page 21 of your book)   Expected Outcomes:  Demonstrated interest in learning. Expect positive outcomes  Education material provided: Living Well with Diabetes, Meal plan card, My Plate and Snack sheet  If problems or questions, patient to contact team via:  Phone  Future DSME appointment: PRN

## 2015-08-23 ENCOUNTER — Telehealth: Payer: Self-pay

## 2015-08-23 NOTE — Telephone Encounter (Signed)
Called pt per [facility] request to get in for Diabetes appt in Aug. Pt did not answer and has no VM set up.

## 2015-10-12 ENCOUNTER — Encounter: Payer: Self-pay | Admitting: Medical

## 2015-10-12 ENCOUNTER — Ambulatory Visit (INDEPENDENT_AMBULATORY_CARE_PROVIDER_SITE_OTHER): Payer: BC Managed Care – PPO | Admitting: Medical

## 2015-10-12 VITALS — BP 140/90 | HR 85 | Resp 16 | Wt 295.4 lb

## 2015-10-12 DIAGNOSIS — G4733 Obstructive sleep apnea (adult) (pediatric): Secondary | ICD-10-CM | POA: Diagnosis not present

## 2015-10-12 DIAGNOSIS — E669 Obesity, unspecified: Secondary | ICD-10-CM

## 2015-10-12 DIAGNOSIS — E049 Nontoxic goiter, unspecified: Secondary | ICD-10-CM

## 2015-10-12 DIAGNOSIS — I10 Essential (primary) hypertension: Secondary | ICD-10-CM | POA: Diagnosis not present

## 2015-10-12 DIAGNOSIS — Z23 Encounter for immunization: Secondary | ICD-10-CM

## 2015-10-12 DIAGNOSIS — Z794 Long term (current) use of insulin: Secondary | ICD-10-CM | POA: Diagnosis not present

## 2015-10-12 DIAGNOSIS — F430 Acute stress reaction: Secondary | ICD-10-CM | POA: Diagnosis not present

## 2015-10-12 DIAGNOSIS — Z566 Other physical and mental strain related to work: Secondary | ICD-10-CM

## 2015-10-12 DIAGNOSIS — E785 Hyperlipidemia, unspecified: Secondary | ICD-10-CM | POA: Diagnosis not present

## 2015-10-12 DIAGNOSIS — E118 Type 2 diabetes mellitus with unspecified complications: Secondary | ICD-10-CM | POA: Diagnosis not present

## 2015-10-12 DIAGNOSIS — Z9989 Dependence on other enabling machines and devices: Secondary | ICD-10-CM

## 2015-10-12 LAB — COMPREHENSIVE METABOLIC PANEL
ALBUMIN: 4.3 g/dL (ref 3.6–5.1)
ALT: 25 U/L (ref 6–29)
AST: 27 U/L (ref 10–35)
Alkaline Phosphatase: 111 U/L (ref 33–130)
BUN: 18 mg/dL (ref 7–25)
CALCIUM: 11 mg/dL — AB (ref 8.6–10.4)
CHLORIDE: 95 mmol/L — AB (ref 98–110)
CO2: 32 mmol/L — ABNORMAL HIGH (ref 20–31)
Creat: 1.13 mg/dL — ABNORMAL HIGH (ref 0.50–1.05)
Glucose, Bld: 305 mg/dL — ABNORMAL HIGH (ref 65–99)
POTASSIUM: 4.2 mmol/L (ref 3.5–5.3)
Sodium: 136 mmol/L (ref 135–146)
TOTAL PROTEIN: 8.2 g/dL — AB (ref 6.1–8.1)
Total Bilirubin: 0.6 mg/dL (ref 0.2–1.2)

## 2015-10-12 NOTE — Progress Notes (Signed)
Subjective: Chief Complaint  Patient presents with  . Diabetes    fill out FMLA papers and f/u    Here for f/u on chronic issues and new issues with work, needs FMLA, short term leave of absence.    She has hx/o diabetes, on insulin, goiter, hypertension, hyperlipidemia, obesity, OSA on CPAP.  She is here requesting leave of absence.   She has vacation days in which she can use them and boss encouraged her to take time off given her recent issues.   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  Never has been written up or had behavioral issues.  However this year she was moved to a different school due to the need their, people quitting abruptly and issues they were having.  Right off the batt she noticed that one employee had placed moldy juice and out of date apple out in the line for kids to buy, but she didn't think this was right and voiced her concern.  The next thing she new she was written up for reportedly missing the expiration dates and being the one placing the moldy juice out on the counter.   This really upset her that she was made out to be in the wrong when she knows she was standing her ground and acting on the safety of the children.  She is emotional over this incident.  She didn't feel she got the appropriate feedback from her supervisor but instead written up.  She has had talks with the school system and has a transfer where she will start at a different school in 1 month.  She has forms today requesting I help her with to take some time off.   Diabetes - compliant with Invokamet 150/1000mg  BID but worried about side effects in the news.  Taking Insulin 70/30, 10 u BID.  Sugars mostly over 200.   Checks sugars few times per week.    Diet - pretty good currently, exercising some.  No recent foot lesions.  No chest pain, no SOB, no edema.  hasn't seen eye doctor in last 12 months.  Checks feet.    OSA - Using CPAP.     Hypertension -  compliant with Exforge 10/320/25mg  daily, Atenolol 100mg  daily, Clonidine 0.2mg  BID, potassium  hyperlipemia - compliant with Crestor 20 and ASA daily   Past Medical History:  Diagnosis Date  . Back pain   . Diabetes mellitus without complication ([place])   . Goiter    left side, saw dr tysinger april 2015 for, had biopsy done was told it is cyst  . H/O echocardiogram    Dr. Charolette Forward, [facility]  . Hyperlipidemia   . Hypertension 2000  . Impaired fasting blood sugar 2014  . Obesity   . Other screening mammogram    planned for 05/2013  . Routine gynecological examination    last pap 2012  . Sleep apnea    on CPAP setting of 2   ROS as in subjective   Objective: BP 140/90   Pulse 85   Resp 16   Wt 295 lb 6.4 oz (134 kg)   LMP 11/09/2013   SpO2 96%   BMI 43.62 kg/m   General appearance: alert, no distress, WD/WN Oral cavity: MMM, no lesions Neck: supple, no lymphadenopathy, generalized enlargement of thyroid, goiter, no masses Heart: RRR, normal S1, S2, no murmurs Lungs: CTA bilaterally, no wheezes, rhonchi, or rales Abdomen: +  bs, soft, non tender, non distended, no masses, no hepatomegaly, no splenomegaly Pulses: 1+ symmetric, upper and lower extremities, normal cap refill Ext: no edema    Assessment: Encounter Diagnoses  Name Primary?  . Work-related stress Yes  . Acute stress reaction   . Type 2 diabetes mellitus with complication, with long-term current use of insulin ([place])   . Essential hypertension, benign   . OSA on CPAP   . Hyperlipidemia   . Obesity   . Goiter   . Need for prophylactic vaccination and inoculation against influenza      Plan: Acute stress, work stress - will work on [employer] and leave of absence paperwork.   Discussed her situation, advised she consult with supervisor about the recent incidents, working towards strategy to resolve the issues.   She has time available to take off work and really feels that she  needs this mental health break to calm down and get her on emotions under control.  Recommended counseling.  She will be transferred to different school at end of this month.  Advised if she ends up needing more time or if other issues arise, will need to see psychology /therapy to get further paperwork completed or further time if needed  Diabetes - labs today, pending labs, will likely refer to endocrinology given difficulty controlling sugars which is likely mostly due to diet and and lifestyle.  Needs to exercise regularly, c/t current medications but we will plan to stop Invokamet given safety concerns with Invokamet.   OSA - c/t CPAP  Hypertension - c/t same medication  Hyperlipidemia - c/t same medication  Counseled on the influenza virus vaccine.  Vaccine information sheet given.  Influenza vaccine given after consent obtained.  Recommended pneumococcal vaccine today. She declines today  See your eye doctor yearly for routine vision care.   Morenike was seen today for diabetes.  Diagnoses and all orders for this visit:  Work-related stress  Acute stress reaction  Type 2 diabetes mellitus with complication, with long-term current use of insulin ([facility]CC) -     Hemoglobin A1c -     Comprehensive metabolic panel  Essential hypertension, benign -     Hemoglobin A1c -     Comprehensive metabolic panel  OSA on CPAP  Hyperlipidemia  Obesity  Goiter  Need for prophylactic vaccination and inoculation against influenza -     Flu Vaccine QUAD 36+ mos PF IM (Fluarix & Fluzone Quad PF)    Spent > 45 minutes face to face with patient in discussion of symptoms, evaluation, plan and recommendations.

## 2015-10-13 ENCOUNTER — Telehealth: Payer: Self-pay | Admitting: Family Medicine

## 2015-10-13 ENCOUNTER — Other Ambulatory Visit: Payer: Self-pay | Admitting: Medical

## 2015-10-13 LAB — HEMOGLOBIN A1C
Hgb A1c MFr Bld: 10.8 % — ABNORMAL HIGH (ref <5.7)
MEAN PLASMA GLUCOSE: 263 mg/dL

## 2015-10-13 MED ORDER — ASPIRIN 81 MG PO TABS: 81.0000 mg | ORAL_TABLET | Freq: Every day | ORAL | 3 refills | Status: DC

## 2015-10-13 MED ORDER — INSULIN PEN NEEDLE 32G X 4 MM MISC: 32.0000 g | Freq: Two times a day (BID) | 5 refills | Status: DC

## 2015-10-13 MED ORDER — ROSUVASTATIN CALCIUM 20 MG PO TABS: 20.0000 mg | ORAL_TABLET | Freq: Every day | ORAL | 3 refills | Status: DC

## 2015-10-13 MED ORDER — POTASSIUM CHLORIDE ER 10 MEQ PO TBCR: 10.0000 meq | EXTENDED_RELEASE_TABLET | Freq: Every day | ORAL | 3 refills | Status: DC

## 2015-10-13 MED ORDER — CLONIDINE HCL 0.2 MG PO TABS: 0.2000 mg | ORAL_TABLET | Freq: Two times a day (BID) | ORAL | 3 refills | Status: DC

## 2015-10-13 MED ORDER — ATENOLOL 100 MG PO TABS: 100.0000 mg | ORAL_TABLET | Freq: Every day | ORAL | 3 refills | Status: DC

## 2015-10-13 MED ORDER — INSULIN ASPART PROT & ASPART (70-30 MIX) 100 UNIT/ML PEN: 15.0000 [IU] | PEN_INJECTOR | Freq: Two times a day (BID) | SUBCUTANEOUS | 2 refills | Status: DC

## 2015-10-13 MED ORDER — AMLODIPINE-VALSARTAN-HCTZ 10-320-25 MG PO TABS: 1.0000 | ORAL_TABLET | Freq: Every day | ORAL | 3 refills | Status: DC

## 2015-10-13 NOTE — Telephone Encounter (Signed)
Called pt and advised FMLA papers are ready.

## 2015-10-17 ENCOUNTER — Other Ambulatory Visit: Payer: Self-pay

## 2015-10-17 DIAGNOSIS — E118 Type 2 diabetes mellitus with unspecified complications: Secondary | ICD-10-CM

## 2015-10-25 ENCOUNTER — Encounter: Payer: Self-pay | Admitting: Internal Medicine

## 2015-10-26 ENCOUNTER — Other Ambulatory Visit: Payer: Self-pay | Admitting: Medical

## 2015-11-02 ENCOUNTER — Telehealth: Payer: Self-pay | Admitting: Medical

## 2015-11-02 NOTE — Telephone Encounter (Signed)
Pt dropped off forms to be filled out, forms are to return to work she is coming in Friday she needs them Friday or she will loose her job, she was wondering if you could go ahead and get started on them that way she could get them at her visit, pt can be reached at [phone] with any questions,  Put in you folder

## 2015-11-04 ENCOUNTER — Ambulatory Visit (INDEPENDENT_AMBULATORY_CARE_PROVIDER_SITE_OTHER): Payer: BC Managed Care – PPO | Admitting: Medical

## 2015-11-04 VITALS — BP 124/84 | HR 75 | Temp 98.3°F | Ht 69.0 in | Wt 294.0 lb

## 2015-11-04 DIAGNOSIS — F430 Acute stress reaction: Secondary | ICD-10-CM | POA: Diagnosis not present

## 2015-11-04 DIAGNOSIS — Z794 Long term (current) use of insulin: Secondary | ICD-10-CM | POA: Diagnosis not present

## 2015-11-04 DIAGNOSIS — E118 Type 2 diabetes mellitus with unspecified complications: Secondary | ICD-10-CM | POA: Diagnosis not present

## 2015-11-04 DIAGNOSIS — Z566 Other physical and mental strain related to work: Secondary | ICD-10-CM | POA: Diagnosis not present

## 2015-11-04 DIAGNOSIS — Z23 Encounter for immunization: Secondary | ICD-10-CM

## 2015-11-04 NOTE — Progress Notes (Signed)
Subjective: Chief Complaint  Patient presents with  . Diabetes    taking meds as directed, readings: highest 454 on 09/15,   . Anxiety    doing better per patient   Here for f/u.  I saw her on 10/12/15 regarding stress, short term leave of absence, and chronic issues.  She has hx/o diabetes, on insulin, goiter, hypertension, hyperlipidemia, obesity, OSA on CPAP.  She has used these last few weeks to work on stress reduction, relaxation, and was able to spend a week helping her sister who just had a baby.   She is ready to go back to work Monday as planned.   She has been in tough with HR and will be transferring to a new school.  Form last visit she was requesting leave of absence.   She has vacation days in which she can use them and boss encouraged her to take time off given her recent issues.   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  Never has been written up or had behavioral issues.  However this year she was moved to a different school due to the need their, people quitting abruptly and issues they were having.  Right off the batt she noticed that one employee had placed moldy juice and out of date apple out in the line for kids to buy, but she didn't think this was right and voiced her concern.  The next thing she new she was written up for reportedly missing the expiration dates and being the one placing the moldy juice out on the counter.   This really upset her that she was made out to be in the wrong when she knows she was standing her ground and acting on the safety of the children.  She is emotional over this incident.  She didn't feel she got the appropriate feedback from her supervisor but instead written up.  She has had talks with the school system and has a transfer where she will start at a different school in 1 month.  She has forms today requesting I help her with to take some time off.   Diabetes - since last visit she stopped the  Invokamet and is compliant with Novolog 70/30 increased to 15 u BID.   Sugars mostly over 200.   Checks sugars few times per week.    Diet - pretty good currently, exercising some.  No recent foot lesions.  No chest pain, no SOB, no edema.  hasn't seen eye doctor in last 12 months.  Checks feet.    OSA - Using CPAP.     Hypertension - compliant with Exforge 10/320/25mg  daily, Atenolol 100mg  daily, Clonidine 0.2mg  BID, potassium  hyperlipemia - compliant with Crestor 20 and ASA daily   Past Medical History:  Diagnosis Date  . Back pain   . Diabetes mellitus without complication ([place])   . Goiter    left side, saw dr Jacody Beneke april 2015 for, had biopsy done was told it is cyst  . H/O echocardiogram    Dr. Charolette [facility], [facility]  . Hyperlipidemia   . Hypertension 2000  . Impaired fasting blood sugar 2014  . Obesity   . Other screening mammogram    planned for 05/2013  . Routine gynecological examination    last pap 2012  . Sleep apnea    on CPAP setting of 2   ROS as in subjective   Objective:  BP 124/84 (BP Location: Right Arm, Patient Position: Sitting, Cuff Size: Large)   Pulse 75   Temp 98.3 F (36.8 C) (Oral)   Ht 5\' 9"  (1.753 m)   Wt 294 lb (133.4 kg)   LMP 11/09/2013   SpO2 94%   BMI 43.42 kg/m   Wt Readings from Last 3 Encounters:  11/04/15 294 lb (133.4 kg)  10/12/15 295 lb 6.4 oz (134 kg)  08/02/15 291 lb (132 kg)    General appearance: alert, no distress, WD/WN Oral cavity: MMM, no lesions Neck: supple, no lymphadenopathy, generalized enlargement of thyroid, goiter, no masses Heart: RRR, normal S1, S2, no murmurs Lungs: CTA bilaterally, no wheezes, rhonchi, or rales Abdomen: +bs, soft, non tender, non distended, no masses, no hepatomegaly, no splenomegaly Pulses: 1+ symmetric, upper and lower extremities, normal cap refill Ext: no edema    Assessment: Encounter Diagnoses  Name Primary?  . Acute stress reaction Yes  .  Work-related stress   . Type 2 diabetes mellitus with complication, with long-term current use of insulin ([place])   . Immunization due      Plan: Completed work related forms regarding her recent leave of absence and return to work set for 11/07/15  On Monday.    She is ready to go back.   advised f/u prn.   Discussed dealing with conflict at work.  diabetes- uncontrolled, recent HgbA1C >10%.  She was unable to be contacted by phone a few weeks ago, so called and verified her upcoming endocrinology appt.  In the meantime she will increase again to Novolog 70/30, 20 u BID, going up 1 unit morning and night DAILY until sugars under 200 pre meal.   She will f/u with endocrinology as planned.   If she gets to 30 units, she will call back if she hasn't seen endocrinology.   discussed avoiding hypoglycemia.  Counseled on the pneumococcal vaccine.  Vaccine information sheet given.  Pneumococcal vaccine PPSV23 given after consent obtained.  OSA - c/t CPAP  Hypertension - c/t same medication  Hyperlipidemia - c/t same medication  F/u 01/2016 for fasting med check, sooner prn.   F/u with endocrinology soon as planned

## 2016-02-03 ENCOUNTER — Ambulatory Visit (INDEPENDENT_AMBULATORY_CARE_PROVIDER_SITE_OTHER): Payer: BC Managed Care – PPO | Admitting: Medical

## 2016-02-03 ENCOUNTER — Encounter: Payer: Self-pay | Admitting: Medical

## 2016-02-03 VITALS — BP 140/88 | HR 80 | Wt 273.8 lb

## 2016-02-03 DIAGNOSIS — M2142 Flat foot [pes planus] (acquired), left foot: Secondary | ICD-10-CM

## 2016-02-03 DIAGNOSIS — M2141 Flat foot [pes planus] (acquired), right foot: Secondary | ICD-10-CM

## 2016-02-03 DIAGNOSIS — M792 Neuralgia and neuritis, unspecified: Secondary | ICD-10-CM

## 2016-02-03 DIAGNOSIS — M79672 Pain in left foot: Secondary | ICD-10-CM | POA: Diagnosis not present

## 2016-02-03 DIAGNOSIS — E119 Type 2 diabetes mellitus without complications: Secondary | ICD-10-CM

## 2016-02-03 DIAGNOSIS — M79671 Pain in right foot: Secondary | ICD-10-CM

## 2016-02-03 LAB — POCT GLYCOSYLATED HEMOGLOBIN (HGB A1C): Hemoglobin A1C: 8.5

## 2016-02-03 MED ORDER — GABAPENTIN 100 MG PO CAPS: 100.0000 mg | ORAL_CAPSULE | Freq: Every day | ORAL | 2 refills | Status: DC

## 2016-02-03 NOTE — Progress Notes (Signed)
Subjective: Chief Complaint  Patient presents with  . dm check    dm check , neruopathy in her feet   Here for f/u on diabetes, neuropathy.   She is using Novolog 70/30, 22 u BID.  Glucose numbers have been running from 120-190, improved from last visit.  Checking feet daily.  Does have foot pain, mostly along balls of feet and soles of foot, worse on right.  Sometimes pins and needle pains, sometimes burning pain.    She has been eating better, walking more, has lost weight since last visit.    She is compliant with medications listed today  No other c/o   Past Medical History:  Diagnosis Date  . Back pain   . Diabetes mellitus without complication ([place])   . Goiter    left side, saw dr Romolo Sieling april 2015 for, had biopsy done was told it is cyst  . H/O echocardiogram    Dr. Charolette Forward, [facility]  . Hyperlipidemia   . Hypertension 2000  . Impaired fasting blood sugar 2014  . Obesity   . Other screening mammogram    planned for 05/2013  . Routine gynecological examination    last pap 2012  . Sleep apnea    on CPAP setting of 2   Current Outpatient Prescriptions on File Prior to Visit  Medication Sig Dispense Refill  . Amlodipine-Valsartan-HCTZ (EXFORGE HCT) 10-320-25 MG TABS Take 1 tablet by mouth daily. 90 tablet 3  . aspirin 81 MG tablet Take 1 tablet (81 mg total) by mouth daily. 90 tablet 3  . atenolol (TENORMIN) 100 MG tablet Take 1 tablet (100 mg total) by mouth daily. 90 tablet 3  . cloNIDine (CATAPRES) 0.2 MG tablet Take 1 tablet (0.2 mg total) by mouth 2 (two) times daily. 180 tablet 3  . insulin aspart protamine - aspart (NOVOLOG MIX 70/30 FLEXPEN) (70-30) 100 UNIT/ML FlexPen Inject 0.15 mLs (15 Units total) into the skin 2 (two) times daily. 15 mL 2  . Insulin Pen Needle 32G X 4 MM MISC 32 g by Does not apply route 2 (two) times daily. 90 each 5  . potassium chloride (K-DUR) 10 MEQ tablet Take 1 tablet (10 mEq total) by mouth daily. 90  tablet 3  . rosuvastatin (CRESTOR) 20 MG tablet Take 1 tablet (20 mg total) by mouth daily. 90 tablet 3   No current facility-administered medications on file prior to visit.    ROS as in subjective    Objective: BP 140/88   Pulse 80   Wt 273 lb 12.8 oz (124.2 kg)   LMP 11/09/2013   SpO2 95%   BMI 40.43 kg/m   Wt Readings from Last 3 Encounters:  02/03/16 273 lb 12.8 oz (124.2 kg)  11/04/15 294 lb (133.4 kg)  10/12/15 295 lb 6.4 oz (134 kg)   BP Readings from Last 3 Encounters:  02/03/16 140/88  11/04/15 124/84  10/12/15 140/90    Gen: wd, wn, nad Heart rrr, normal s1, s2, no murmurs Lungs clear Ext: no edema Pulses WNL  Diabetic Foot Exam - Simple   Simple Foot Form Diabetic Foot exam was performed with the following findings:  Yes 02/03/2016  8:06 PM  Visual Inspection See comments:  Yes Sensation Testing Intact to touch and monofilament testing bilaterally:  Yes Pulse Check Posterior Tibialis and Dorsalis pulse intact bilaterally:  Yes Comments Pes planus bilat, thickened toenails throughout, mild to moderate, nontender, normal ROM      Assessment: Encounter Diagnoses  Name Primary?  . Diabetes mellitus without complication ([place]) Yes  . Peripheral neuropathic pain   . Foot pain, bilateral   . Pes planus of both feet     Plan: Diabetes - congratulated her on her weight loss, improvements in diet, and overall efforts.  Change to checking glucose fasting in the morning, c/t daily foot checks, c/t healthy diet, regular exercise.   Of note, today's HgbA1C is the lowest its been in 2 years!  We are making progress.  Increase to Novolog 70/30, 24 u BID.     Peripheral neuropathy - labs today.  Begin trial of Gabapentin.     Foot pain, pes planus - advised she change to shoes with good arch support, avoid going barefooted  [facility] was seen today for dm check.  Diagnoses and all orders for this visit:  Diabetes mellitus without complication ([place]) -     HgB  A1c -     Comprehensive metabolic panel -     Insulin, random -     C-peptide -     Vitamin B12 -     gabapentin (NEURONTIN) 100 MG capsule; Take 1 capsule (100 mg total) by mouth at bedtime. -     Uric acid -     HM DIABETES FOOT EXAM  Peripheral neuropathic pain -     Vitamin B12 -     gabapentin (NEURONTIN) 100 MG capsule; Take 1 capsule (100 mg total) by mouth at bedtime. -     Uric acid  Foot pain, bilateral -     Vitamin B12 -     gabapentin (NEURONTIN) 100 MG capsule; Take 1 capsule (100 mg total) by mouth at bedtime. -     Uric acid  Pes planus of both feet

## 2016-02-04 LAB — C-PEPTIDE: C-Peptide: 12.58 ng/mL — ABNORMAL HIGH (ref 0.80–3.85)

## 2016-02-04 LAB — COMPREHENSIVE METABOLIC PANEL
ALK PHOS: 157 U/L — AB (ref 33–130)
ALT: 34 U/L — AB (ref 6–29)
AST: 39 U/L — AB (ref 10–35)
Albumin: 3.9 g/dL (ref 3.6–5.1)
BILIRUBIN TOTAL: 0.6 mg/dL (ref 0.2–1.2)
BUN: 18 mg/dL (ref 7–25)
CO2: 25 mmol/L (ref 20–31)
Calcium: 11.6 mg/dL — ABNORMAL HIGH (ref 8.6–10.4)
Chloride: 99 mmol/L (ref 98–110)
Creat: 1.03 mg/dL (ref 0.50–1.05)
GLUCOSE: 209 mg/dL — AB (ref 65–99)
POTASSIUM: 3.4 mmol/L — AB (ref 3.5–5.3)
Sodium: 141 mmol/L (ref 135–146)
Total Protein: 8 g/dL (ref 6.1–8.1)

## 2016-02-04 LAB — VITAMIN B12: Vitamin B-12: 811 pg/mL (ref 200–1100)

## 2016-02-04 LAB — URIC ACID: URIC ACID, SERUM: 9.1 mg/dL — AB (ref 2.5–7.0)

## 2016-02-04 LAB — INSULIN, RANDOM: Insulin: 80.5 u[IU]/mL — ABNORMAL HIGH (ref 2.0–19.6)

## 2016-02-06 DIAGNOSIS — D869 Sarcoidosis, unspecified: Secondary | ICD-10-CM

## 2016-02-06 HISTORY — DX: Sarcoidosis, unspecified: D86.9

## 2016-02-07 ENCOUNTER — Other Ambulatory Visit: Payer: Self-pay

## 2016-02-07 ENCOUNTER — Other Ambulatory Visit: Payer: Self-pay | Admitting: Medical

## 2016-02-07 DIAGNOSIS — R945 Abnormal results of liver function studies: Principal | ICD-10-CM

## 2016-02-07 DIAGNOSIS — K760 Fatty (change of) liver, not elsewhere classified: Secondary | ICD-10-CM

## 2016-02-07 DIAGNOSIS — R7989 Other specified abnormal findings of blood chemistry: Secondary | ICD-10-CM

## 2016-02-07 DIAGNOSIS — R748 Abnormal levels of other serum enzymes: Secondary | ICD-10-CM

## 2016-02-07 MED ORDER — ALLOPURINOL 100 MG PO TABS: 100.0000 mg | ORAL_TABLET | Freq: Every day | ORAL | 2 refills | Status: DC

## 2016-02-08 ENCOUNTER — Ambulatory Visit
Admission: RE | Admit: 2016-02-08 | Discharge: 2016-02-08 | Disposition: A | Discharge status: Home / Self Care | Payer: BC Managed Care – PPO | Source: Ambulatory Visit | Attending: Medical | Admitting: Medical

## 2016-02-08 DIAGNOSIS — K760 Fatty (change of) liver, not elsewhere classified: Secondary | ICD-10-CM

## 2016-02-28 ENCOUNTER — Other Ambulatory Visit: Payer: Self-pay | Admitting: Medical

## 2016-03-07 ENCOUNTER — Encounter (HOSPITAL_COMMUNITY): Payer: Self-pay | Admitting: Emergency Medicine

## 2016-03-07 ENCOUNTER — Ambulatory Visit (HOSPITAL_COMMUNITY)
Admission: EM | Admit: 2016-03-07 | Discharge: 2016-03-07 | Disposition: A | Discharge status: Home / Self Care | Payer: BC Managed Care – PPO | Attending: Family Medicine | Admitting: Family Medicine

## 2016-03-07 DIAGNOSIS — N289 Disorder of kidney and ureter, unspecified: Secondary | ICD-10-CM | POA: Diagnosis not present

## 2016-03-07 LAB — POCT I-STAT, CHEM 8
BUN: 35 mg/dL — AB (ref 6–20)
CHLORIDE: 97 mmol/L — AB (ref 101–111)
CREATININE: 2.1 mg/dL — AB (ref 0.44–1.00)
Calcium, Ion: 1.51 mmol/L (ref 1.15–1.40)
Glucose, Bld: 112 mg/dL — ABNORMAL HIGH (ref 65–99)
HEMATOCRIT: 41 % (ref 36.0–46.0)
HEMOGLOBIN: 13.9 g/dL (ref 12.0–15.0)
POTASSIUM: 3.5 mmol/L (ref 3.5–5.1)
Sodium: 139 mmol/L (ref 135–145)
TCO2: 30 mmol/L (ref 0–100)

## 2016-03-07 LAB — GLUCOSE, CAPILLARY: Glucose-Capillary: 116 mg/dL — ABNORMAL HIGH (ref 65–99)

## 2016-03-07 NOTE — Discharge Instructions (Signed)
See your doctor about your kidney problem as soon as possible.

## 2016-03-07 NOTE — ED Provider Notes (Signed)
[facility]    CSN: [id number] Arrival date & time: 03/07/16  1036     History   Chief Complaint Chief Complaint  Patient presents with  . Dizziness    HPI Amy Rosario is a 57 y.o. female.   The history is provided by the patient.  Dizziness  Quality:  Lightheadedness Severity:  Moderate Onset quality:  Sudden Duration:  1 week Progression:  Unchanged Chronicity:  New Context: not with head movement, not with loss of consciousness and not when standing up   Relieved by:  Nothing Worsened by:  Nothing Ineffective treatments:  None tried Associated symptoms: no headaches, no palpitations, no tinnitus and no vomiting     Past Medical History:  Diagnosis Date  . Back pain   . Diabetes mellitus without complication ([place])   . Goiter    left side, saw dr tysinger april 2015 for, had biopsy done was told it is cyst  . H/O echocardiogram    Dr. Charolette Forward, [facility]  . Hyperlipidemia   . Hypertension 2000  . Impaired fasting blood sugar 2014  . Obesity   . Other screening mammogram    planned for 05/2013  . Routine gynecological examination    last pap 2012  . Sleep apnea    on CPAP setting of 2    Patient Active Problem List   Diagnosis Date Noted  . Acute stress reaction 11/04/2015  . Work-related stress 11/04/2015  . Type 2 diabetes mellitus with complication, with long-term current use of insulin ([place]) 06/27/2015  . OSA on CPAP 06/27/2015  . Obesity 05/27/2013  . Essential hypertension, benign 05/27/2013  . Hyperlipidemia 05/27/2013    Past Surgical History:  Procedure Laterality Date  . COLONOSCOPY WITH PROPOFOL N/A 09/21/2013   Procedure: COLONOSCOPY WITH PROPOFOL;  Surgeon: Juanita Craver, MD;  Location: [facility];  Service: Endoscopy;  Laterality: N/A;  . CYST EXCISION     right low back  . TUBAL LIGATION      OB History    Gravida Para Term Preterm AB Living   3 3 3     3    SAB TAB Ectopic Multiple Live  Births                   Home Medications    Prior to Admission medications   Medication Sig Start Date End Date Taking? Authorizing Provider  allopurinol (ZYLOPRIM) 100 MG tablet Take 1 tablet (100 mg total) by mouth daily. 02/07/16   Camelia Eng Tysinger, PA-C  Amlodipine-Valsartan-HCTZ (EXFORGE HCT) [id number] MG TABS Take 1 tablet by mouth daily. 10/13/15   Camelia Eng Tysinger, PA-C  aspirin 81 MG tablet Take 1 tablet (81 mg total) by mouth daily. 10/13/15   Camelia Eng Tysinger, PA-C  atenolol (TENORMIN) 100 MG tablet Take 1 tablet (100 mg total) by mouth daily. 10/13/15   Camelia Eng Tysinger, PA-C  cloNIDine (CATAPRES) 0.2 MG tablet Take 1 tablet (0.2 mg total) by mouth 2 (two) times daily. 10/13/15   Camelia Eng Tysinger, PA-C  cloNIDine (CATAPRES) 0.2 MG tablet TAKE 1 TABLET (0.2 MG TOTAL) BY MOUTH 2 (TWO) TIMES DAILY. 02/28/16   Camelia Eng Tysinger, PA-C  gabapentin (NEURONTIN) 100 MG capsule Take 1 capsule (100 mg total) by mouth at bedtime. 02/03/16   Camelia Eng Tysinger, PA-C  insulin aspart protamine - aspart (NOVOLOG MIX 70/30 FLEXPEN) (70-30) 100 UNIT/ML FlexPen Inject 0.15 mLs (15 Units total) into the skin 2 (two) times daily. 10/13/15  Camelia Eng Tysinger, PA-C  Insulin Pen Needle 32G X 4 MM MISC 32 g by Does not apply route 2 (two) times daily. 10/13/15   Camelia Eng Tysinger, PA-C  KLOR-CON 10 10 MEQ tablet TAKE 1 TABLET (10 MEQ TOTAL) BY MOUTH DAILY. 02/28/16   Camelia Eng Tysinger, PA-C  potassium chloride (K-DUR) 10 MEQ tablet Take 1 tablet (10 mEq total) by mouth daily. 10/13/15   Camelia Eng Tysinger, PA-C  rosuvastatin (CRESTOR) 20 MG tablet Take 1 tablet (20 mg total) by mouth daily. 10/13/15   Camelia Eng Tysinger, PA-C  rosuvastatin (CRESTOR) 20 MG tablet TAKE 1 TABLET BY MOUTH EVERY DAY 02/28/16   Carlena Hurl, PA-C    Family History Family History  Problem Relation Age of Onset  . Heart disease Father   . Hypertension Father   . Cancer Neg Hx   . Stroke Neg Hx   . Diabetes Neg Hx   . Heart disease Brother      Social History Social History  Substance Use Topics  . Smoking status: Never Smoker  . Smokeless tobacco: Never Used  . Alcohol use No     Allergies   Penicillins   Review of Systems Review of Systems  Constitutional: Negative.   HENT: Negative.  Negative for tinnitus.   Respiratory: Negative.   Cardiovascular: Negative.  Negative for palpitations.  Gastrointestinal: Negative.  Negative for vomiting.  Genitourinary: Negative.   Neurological: Positive for dizziness. Negative for facial asymmetry, speech difficulty, numbness and headaches.  All other systems reviewed and are negative.    Physical Exam Triage Vital Signs ED Triage Vitals [03/07/16 1135]  Enc Vitals Group     BP 155/73     Pulse Rate 70     Resp 20     Temp 97.9 F (36.6 C)     Temp Source Oral     SpO2 100 %     Weight      Height      Head Circumference      Peak Flow      Pain Score 0     Pain Loc      Pain Edu?      Excl. in [place]?    No data found.   Updated Vital Signs BP 155/73 (BP Location: Right Arm)   Pulse 70   Temp 97.9 F (36.6 C) (Oral)   Resp 20   LMP 11/09/2013   SpO2 100%   Visual Acuity Right Eye Distance:   Left Eye Distance:   Bilateral Distance:    Right Eye Near:   Left Eye Near:    Bilateral Near:     Physical Exam  Constitutional: She is oriented to person, place, and time. She appears well-developed and well-nourished.  HENT:  Right Ear: External ear normal.  Left Ear: External ear normal.  Nose: Nose normal.  Mouth/Throat: Oropharyngeal exudate present.  Eyes: Conjunctivae and EOM are normal. Pupils are equal, round, and reactive to light.  Neck: Normal range of motion. Neck supple.  Cardiovascular: Normal rate, regular rhythm, normal heart sounds and intact distal pulses.   Musculoskeletal: Normal range of motion.  Lymphadenopathy:    She has no cervical adenopathy.  Neurological: She is alert and oriented to person, place, and time. No cranial  nerve deficit. Coordination normal.  Skin: Skin is warm and dry.  Nursing note and vitals reviewed.    UC Treatments / Results  Labs (all labs ordered are listed, but only abnormal results  are displayed) Labs Reviewed  GLUCOSE, CAPILLARY - Abnormal; Notable for the following:       Result Value   Glucose-Capillary 116 (*)    All other components within normal limits  POCT I-STAT, CHEM 8 - Abnormal; Notable for the following:    Chloride 97 (*)    BUN 35 (*)    Creatinine, Ser 2.10 (*)    Glucose, Bld 112 (*)    Calcium, Ion 1.51 (*)    All other components within normal limits    EKG  EKG Interpretation None       Radiology No results found.  Procedures Procedures (including critical care time)  Medications Ordered in UC Medications - No data to display   Initial Impression / Assessment and Plan / UC Course  I have reviewed the triage vital signs and the nursing notes.  Pertinent labs & imaging results that were available during my care of the patient were reviewed by me and considered in my medical decision making (see chart for details).       Final Clinical Impressions(s) / UC Diagnoses   Final diagnoses:  Renal insufficiency, mild    New Prescriptions New Prescriptions   No medications on file     Billy Fischer, MD 03/07/16 1335

## 2016-03-07 NOTE — ED Triage Notes (Signed)
The patient presented to the [facility] with a complaint of dizziness for 1 week.

## 2016-03-07 NOTE — ED Notes (Signed)
Verbal report of CBG was 116.

## 2016-03-08 ENCOUNTER — Telehealth: Payer: Self-pay

## 2016-03-08 ENCOUNTER — Encounter: Payer: Self-pay | Admitting: Medical

## 2016-03-08 ENCOUNTER — Ambulatory Visit (INDEPENDENT_AMBULATORY_CARE_PROVIDER_SITE_OTHER): Payer: BC Managed Care – PPO | Admitting: Medical

## 2016-03-08 VITALS — BP 150/90 | HR 56 | Resp 16 | Wt 264.8 lb

## 2016-03-08 DIAGNOSIS — R7989 Other specified abnormal findings of blood chemistry: Secondary | ICD-10-CM

## 2016-03-08 DIAGNOSIS — E785 Hyperlipidemia, unspecified: Secondary | ICD-10-CM | POA: Diagnosis not present

## 2016-03-08 DIAGNOSIS — R634 Abnormal weight loss: Secondary | ICD-10-CM | POA: Insufficient documentation

## 2016-03-08 DIAGNOSIS — I10 Essential (primary) hypertension: Secondary | ICD-10-CM

## 2016-03-08 DIAGNOSIS — N289 Disorder of kidney and ureter, unspecified: Secondary | ICD-10-CM | POA: Diagnosis not present

## 2016-03-08 DIAGNOSIS — R935 Abnormal findings on diagnostic imaging of other abdominal regions, including retroperitoneum: Secondary | ICD-10-CM | POA: Insufficient documentation

## 2016-03-08 DIAGNOSIS — R87619 Unspecified abnormal cytological findings in specimens from cervix uteri: Secondary | ICD-10-CM

## 2016-03-08 DIAGNOSIS — R748 Abnormal levels of other serum enzymes: Secondary | ICD-10-CM | POA: Diagnosis not present

## 2016-03-08 DIAGNOSIS — E118 Type 2 diabetes mellitus with unspecified complications: Secondary | ICD-10-CM

## 2016-03-08 DIAGNOSIS — R945 Abnormal results of liver function studies: Principal | ICD-10-CM

## 2016-03-08 DIAGNOSIS — Z794 Long term (current) use of insulin: Secondary | ICD-10-CM | POA: Diagnosis not present

## 2016-03-08 DIAGNOSIS — K760 Fatty (change of) liver, not elsewhere classified: Secondary | ICD-10-CM

## 2016-03-08 DIAGNOSIS — Z1231 Encounter for screening mammogram for malignant neoplasm of breast: Secondary | ICD-10-CM | POA: Insufficient documentation

## 2016-03-08 LAB — POCT URINALYSIS DIPSTICK
Bilirubin, UA: NEGATIVE
Blood, UA: NEGATIVE
GLUCOSE UA: NEGATIVE
Ketones, UA: NEGATIVE
Leukocytes, UA: NEGATIVE
NITRITE UA: NEGATIVE
PH UA: 5.5
UROBILINOGEN UA: NEGATIVE

## 2016-03-08 LAB — CBC WITH DIFFERENTIAL/PLATELET
Basophils Absolute: 74 cells/uL (ref 0–200)
Basophils Relative: 1 %
Eosinophils Absolute: 592 cells/uL — ABNORMAL HIGH (ref 15–500)
Eosinophils Relative: 8 %
HEMATOCRIT: 41 % (ref 35.0–45.0)
Hemoglobin: 13.4 g/dL (ref 11.7–15.5)
LYMPHS PCT: 48 %
Lymphs Abs: 3552 cells/uL (ref 850–3900)
MCH: 27.7 pg (ref 27.0–33.0)
MCHC: 32.7 g/dL (ref 32.0–36.0)
MCV: 84.7 fL (ref 80.0–100.0)
MONO ABS: 740 {cells}/uL (ref 200–950)
MONOS PCT: 10 %
MPV: 10.7 fL (ref 7.5–12.5)
NEUTROS PCT: 33 %
Neutro Abs: 2442 cells/uL (ref 1500–7800)
PLATELETS: 273 10*3/uL (ref 140–400)
RBC: 4.84 MIL/uL (ref 3.80–5.10)
RDW: 13.7 % (ref 11.0–15.0)
WBC: 7.4 10*3/uL (ref 4.0–10.5)

## 2016-03-08 LAB — COMPREHENSIVE METABOLIC PANEL
ALT: 24 U/L (ref 6–29)
AST: 30 U/L (ref 10–35)
Albumin: 3.9 g/dL (ref 3.6–5.1)
Alkaline Phosphatase: 158 U/L — ABNORMAL HIGH (ref 33–130)
BILIRUBIN TOTAL: 0.7 mg/dL (ref 0.2–1.2)
BUN: 31 mg/dL — AB (ref 7–25)
CALCIUM: 12.4 mg/dL — AB (ref 8.6–10.4)
CO2: 30 mmol/L (ref 20–31)
Chloride: 96 mmol/L — ABNORMAL LOW (ref 98–110)
Creat: 1.94 mg/dL — ABNORMAL HIGH (ref 0.50–1.05)
GLUCOSE: 84 mg/dL (ref 65–99)
Potassium: 3.7 mmol/L (ref 3.5–5.3)
SODIUM: 138 mmol/L (ref 135–146)
Total Protein: 8.8 g/dL — ABNORMAL HIGH (ref 6.1–8.1)

## 2016-03-08 LAB — TSH: TSH: 3.6 m[IU]/L

## 2016-03-08 NOTE — Telephone Encounter (Signed)
Pt called to let you know the name corcidin maximum strength. Ingredients: acetamophen 500mg , and chlopheniamine antihistamine 2mg  and dextrometophan 15mg  in each tablet. She usually takes 1 tablet every 4-6 hours.

## 2016-03-08 NOTE — Progress Notes (Signed)
Subjective: Chief Complaint  Patient presents with  . Follow-up    UC- dizziness reports worse between 9-930 and 1-130. Went to UC- told to f/u here. kidney issues    Here for f/u.  Went to urgent care yesterday for dizziness.  Had labs, abnormal kidney function seen.  Advised to f/u here.  Urgent Care felt like her dizziness was related to kidney function which they attributed to her medications.   Denies bleeding or bruising.   She didn't started allopurinol or gabapentin form last visit (possible miscommunication from last visit).     Last pap - referred to gyn 2015 Last colon 2015, Dr. Collene Mares Last mammo 2015?  Exercise - walking 3 days per week, for an hour Diet - eating breakfast, lunch, snack other times.  appetite is down in general.    She has her last 2 month hx/o recordings DAILY for BP, pulse and glucose and all look good.  Using Novolog mix 70/30, 24 BID, recent glucose 80s - 137.  BPs and pulses recently for month of January all normal.  Dizziness twice daily, started last week.  Dizziness last for 10 minutes or so.   Feels like room spinning.   No chest pain, no SOB, no palpitations, no recent edema.  Has not felt numbness or tingling.  Sometimes feels weak in general.   Drinks a good amount of water.     Past Medical History:  Diagnosis Date  . Back pain   . Diabetes mellitus without complication ([place])   . Goiter    left side, saw dr tysinger april 2015 for, had biopsy done was told it is cyst  . H/O echocardiogram    Dr. Charolette Forward, [facility]  . Hyperlipidemia   . Hypertension 2000  . Impaired fasting blood sugar 2014  . Obesity   . Other screening mammogram    planned for 05/2013  . Routine gynecological examination    last pap 2012  . Sleep apnea    on CPAP setting of 2   Current Outpatient Prescriptions on File Prior to Visit  Medication Sig Dispense Refill  . allopurinol (ZYLOPRIM) 100 MG tablet Take 1 tablet (100 mg total) by  mouth daily. 30 tablet 2  . Amlodipine-Valsartan-HCTZ (EXFORGE HCT) 10-320-25 MG TABS Take 1 tablet by mouth daily. 90 tablet 3  . aspirin 81 MG tablet Take 1 tablet (81 mg total) by mouth daily. 90 tablet 3  . atenolol (TENORMIN) 100 MG tablet Take 1 tablet (100 mg total) by mouth daily. 90 tablet 3  . cloNIDine (CATAPRES) 0.2 MG tablet Take 1 tablet (0.2 mg total) by mouth 2 (two) times daily. 180 tablet 3  . cloNIDine (CATAPRES) 0.2 MG tablet TAKE 1 TABLET (0.2 MG TOTAL) BY MOUTH 2 (TWO) TIMES DAILY. 180 tablet 1  . gabapentin (NEURONTIN) 100 MG capsule Take 1 capsule (100 mg total) by mouth at bedtime. 30 capsule 2  . insulin aspart protamine - aspart (NOVOLOG MIX 70/30 FLEXPEN) (70-30) 100 UNIT/ML FlexPen Inject 0.15 mLs (15 Units total) into the skin 2 (two) times daily. 15 mL 2  . Insulin Pen Needle 32G X 4 MM MISC 32 g by Does not apply route 2 (two) times daily. 90 each 5  . KLOR-CON 10 10 MEQ tablet TAKE 1 TABLET (10 MEQ TOTAL) BY MOUTH DAILY. 90 tablet 1  . potassium chloride (K-DUR) 10 MEQ tablet Take 1 tablet (10 mEq total) by mouth daily. 90 tablet 3  . rosuvastatin (CRESTOR)  20 MG tablet Take 1 tablet (20 mg total) by mouth daily. 90 tablet 3  . rosuvastatin (CRESTOR) 20 MG tablet TAKE 1 TABLET BY MOUTH EVERY DAY 90 tablet 1   No current facility-administered medications on file prior to visit.    Past Surgical History:  Procedure Laterality Date  . COLONOSCOPY WITH PROPOFOL N/A 09/21/2013   Procedure: COLONOSCOPY WITH PROPOFOL;  Surgeon: Juanita Craver, MD;  Location: [facility];  Service: Endoscopy;  Laterality: N/A;  . CYST EXCISION     right low back  . TUBAL LIGATION      ROS as in subjective  Objective: BP (!) 150/90   Pulse (!) 56   Resp 16   Wt 264 lb 12.8 oz (120.1 kg)   LMP 11/09/2013   SpO2 92%   BMI 39.10 kg/m   Wt Readings from Last 3 Encounters:  03/08/16 264 lb 12.8 oz (120.1 kg)  02/03/16 273 lb 12.8 oz (124.2 kg)  11/04/15 294 lb (133.4 kg)   BP  Readings from Last 3 Encounters:  03/08/16 (!) 150/90  03/07/16 155/73  02/03/16 140/88   General appearance: alert, no distress, WD/WN,  HEENT: normocephalic, sclerae anicteric, TMs pearly, nares patent, no discharge or erythema, pharynx normal Oral cavity: MMM, no lesions Neck: supple, no lymphadenopathy, no thyromegaly, no masses Heart: RRR, normal S1, S2, no murmurs Lungs: CTA bilaterally, no wheezes, rhonchi, or rales Abdomen: +bs, soft, mild left upper quadrant tenderness, otherwise non tender, non distended, no masses, no hepatomegaly, no splenomegaly Pulses: 2+ symmetric, upper and lower extremities, normal cap refill Ext: no edema neuro non focal    Assessment: Encounter Diagnoses  Name Primary?  . Elevated LFTs Yes  . Elevated alkaline phosphatase level   . Renal insufficiency   . Abnormal ultrasound of abdomen   . Fatty liver   . Essential hypertension, benign   . Type 2 diabetes mellitus with complication, with long-term current use of insulin ([place])   . Hyperlipidemia, unspecified hyperlipidemia type   . Abnormal cervical Papanicolaou smear, unspecified abnormal pap finding   . Screening mammogram, encounter for   . Weight loss      Plan: I am concerned about her recent weight loss.  Need to rule out pathological causes.   I asked to refer her to gyn 2 years ago for abnormal pap.  somehow this fell through the cracks, either she didn't go or there was a miscommunication.   Nevertheless, refer to gyn for abnormal pap. Advised she scheduled mammogram.  Additional labs today.   Advised she call back today to let me know what is in the cough medication she is taking.    BP and sugars currently under control.  reviewed her recent month of numbers.   Glad to see this  Change to Novolog mix 70/30, 23 u BID, down from 24 u BID.     Given the recent kidney finding and being slightly dry, advised she drink a big glass of water per hour and increased clear fluid intake in  general.   Consider meclizine when she calls back with her OTC cough/congestion medication info.   She could have some vertigo but this seems more related to dehydration given BUN/Cr.   She never started Allopurinol or Gabapentin from last visit.   Pending labs consider starting these.   Discussed fatty liver disease, recent US.   We will likely pursue additional imaging of abdomen and pelvic, CT vs MRI pending labs   F/u pending labs.  Amy Rosario was seen today for follow-up.  Diagnoses and all orders for this visit:  Elevated LFTs -     Comprehensive metabolic panel -     TSH -     AFP tumor marker -     POCT urinalysis dipstick -     CBC with Differential/Platelet  Elevated alkaline phosphatase level -     Comprehensive metabolic panel -     TSH -     AFP tumor marker -     POCT urinalysis dipstick -     CBC with Differential/Platelet  Renal insufficiency -     Comprehensive metabolic panel -     TSH -     AFP tumor marker -     POCT urinalysis dipstick -     CBC with Differential/Platelet  Abnormal ultrasound of abdomen -     Comprehensive metabolic panel -     TSH -     AFP tumor marker -     POCT urinalysis dipstick -     CBC with Differential/Platelet  Fatty liver -     Comprehensive metabolic panel -     TSH -     AFP tumor marker -     POCT urinalysis dipstick -     CBC with Differential/Platelet  Essential hypertension, benign -     Comprehensive metabolic panel -     TSH -     AFP tumor marker -     POCT urinalysis dipstick -     CBC with Differential/Platelet  Type 2 diabetes mellitus with complication, with long-term current use of insulin ([facility]) -     Comprehensive metabolic panel -     TSH -     AFP tumor marker -     POCT urinalysis dipstick -     CBC with Differential/Platelet  Hyperlipidemia, unspecified hyperlipidemia type -     Comprehensive metabolic panel -     TSH -     AFP tumor marker -     POCT urinalysis dipstick -     CBC with  Differential/Platelet  Abnormal cervical Papanicolaou smear, unspecified abnormal pap finding -     Ambulatory referral to Gynecology  Screening mammogram, encounter for -     MM DIGITAL SCREENING BILATERAL; Future  Weight loss

## 2016-03-09 ENCOUNTER — Other Ambulatory Visit: Payer: Self-pay | Admitting: Medical

## 2016-03-09 DIAGNOSIS — K760 Fatty (change of) liver, not elsewhere classified: Secondary | ICD-10-CM

## 2016-03-09 DIAGNOSIS — R945 Abnormal results of liver function studies: Secondary | ICD-10-CM

## 2016-03-09 DIAGNOSIS — R7989 Other specified abnormal findings of blood chemistry: Secondary | ICD-10-CM

## 2016-03-09 DIAGNOSIS — R87619 Unspecified abnormal cytological findings in specimens from cervix uteri: Secondary | ICD-10-CM

## 2016-03-09 DIAGNOSIS — R935 Abnormal findings on diagnostic imaging of other abdominal regions, including retroperitoneum: Secondary | ICD-10-CM

## 2016-03-09 DIAGNOSIS — R748 Abnormal levels of other serum enzymes: Secondary | ICD-10-CM

## 2016-03-09 DIAGNOSIS — N289 Disorder of kidney and ureter, unspecified: Secondary | ICD-10-CM

## 2016-03-09 DIAGNOSIS — R634 Abnormal weight loss: Secondary | ICD-10-CM

## 2016-03-09 LAB — AFP TUMOR MARKER: AFP TUMOR MARKER: 2.9 ng/mL (ref <6.1)

## 2016-03-09 NOTE — Telephone Encounter (Signed)
Pt notified of recommendations

## 2016-03-09 NOTE — Telephone Encounter (Signed)
Have her use this 1 or 2 times daily for the next few days to see if this helps with dizziness and congestion.   See other message about labs, other tests

## 2016-03-13 LAB — PROTEIN ELECTROPHORESIS, SERUM
Albumin ELP: 3.9 g/dL (ref 3.8–4.8)
Alpha-1-Globulin: 0.5 g/dL — ABNORMAL HIGH (ref 0.2–0.3)
Alpha-2-Globulin: 1 g/dL — ABNORMAL HIGH (ref 0.5–0.9)
BETA 2: 0.7 g/dL — AB (ref 0.2–0.5)
Beta Globulin: 0.4 g/dL (ref 0.4–0.6)
Gamma Globulin: 2.4 g/dL — ABNORMAL HIGH (ref 0.8–1.7)
TOTAL PROTEIN, SERUM ELECTROPHOR: 8.8 g/dL — AB (ref 6.1–8.1)

## 2016-03-15 ENCOUNTER — Other Ambulatory Visit: Payer: Self-pay | Admitting: Medical

## 2016-03-15 DIAGNOSIS — N6310 Unspecified lump in the right breast, unspecified quadrant: Secondary | ICD-10-CM

## 2016-03-20 ENCOUNTER — Encounter: Payer: Self-pay | Admitting: Obstetrics and Gynecology

## 2016-03-20 ENCOUNTER — Ambulatory Visit (INDEPENDENT_AMBULATORY_CARE_PROVIDER_SITE_OTHER): Payer: BC Managed Care – PPO | Admitting: Obstetrics and Gynecology

## 2016-03-20 VITALS — BP 148/82 | HR 64 | Resp 14 | Ht 68.0 in | Wt 263.0 lb

## 2016-03-20 DIAGNOSIS — Z01419 Encounter for gynecological examination (general) (routine) without abnormal findings: Secondary | ICD-10-CM

## 2016-03-20 DIAGNOSIS — Z124 Encounter for screening for malignant neoplasm of cervix: Secondary | ICD-10-CM

## 2016-03-20 DIAGNOSIS — Z87410 Personal history of cervical dysplasia: Secondary | ICD-10-CM

## 2016-03-20 NOTE — Patient Instructions (Signed)

## 2016-03-20 NOTE — Progress Notes (Signed)
56 y.o. [id number] SingleAfrican AmericanF here for annual exam/ history of abnormal paps.   No vaginal bleeding, not sexually active. No significant vasomotor symptoms.     Patient's last menstrual period was 11/09/2013.          Sexually active: No.  The current method of family planning is post menopausal status.    Exercising: Yes.    walking Smoker:  no  Health Maintenance: Pap:  05/27/13 LGSIL, negative HPV; results in EPIC Colposcopy in 5/15, negative, ECC was benign History of abnormal Pap:  yes MMG:  06/24/13 US BREAST LTD UNI RIGHT - BIRADS 3 Benign - patient scheduled for next MMG 03/23/16 Colonoscopy:  09/21/13 Normal per patient BMD:   N/A TDaP:  05/27/13 Gardasil: never   reports that she has never smoked. She has never used smokeless tobacco. She reports that she does not drink alcohol or use drugs. She is a Training and development officer.  3 grown kids (all boys), 8 grand kids and one on the way.   Past Medical History:  Diagnosis Date  . Back pain   . Diabetes mellitus without complication ([employer])   . Goiter    left side, saw dr tysinger april 2015 for, had biopsy done was told it is cyst  . H/O echocardiogram    Dr. Charolette [facility], [facility]  . Hyperlipidemia   . Hypertension 2000  . Impaired fasting blood sugar 2014  . Obesity   . Other screening mammogram    planned for 05/2013  . Routine gynecological examination    last pap 2012  . Sleep apnea    on CPAP setting of 2    Past Surgical History:  Procedure Laterality Date  . COLONOSCOPY WITH PROPOFOL N/A 09/21/2013   Procedure: COLONOSCOPY WITH PROPOFOL;  Surgeon: Juanita Craver, MD;  Location: [facility];  Service: Endoscopy;  Laterality: N/A;  . CYST EXCISION     right low back  . TUBAL LIGATION      Current Outpatient Prescriptions  Medication Sig Dispense Refill  . allopurinol (ZYLOPRIM) 100 MG tablet Take 1 tablet (100 mg total) by mouth daily. 30 tablet 2  . Amlodipine-Valsartan-HCTZ (EXFORGE HCT)  10-320-25 MG TABS Take 1 tablet by mouth daily. 90 tablet 3  . aspirin 81 MG tablet Take 1 tablet (81 mg total) by mouth daily. 90 tablet 3  . atenolol (TENORMIN) 100 MG tablet Take 1 tablet (100 mg total) by mouth daily. 90 tablet 3  . cloNIDine (CATAPRES) 0.2 MG tablet Take 1 tablet (0.2 mg total) by mouth 2 (two) times daily. 180 tablet 3  . cloNIDine (CATAPRES) 0.2 MG tablet TAKE 1 TABLET (0.2 MG TOTAL) BY MOUTH 2 (TWO) TIMES DAILY. 180 tablet 1  . gabapentin (NEURONTIN) 100 MG capsule Take 1 capsule (100 mg total) by mouth at bedtime. 30 capsule 2  . insulin aspart protamine - aspart (NOVOLOG MIX 70/30 FLEXPEN) (70-30) 100 UNIT/ML FlexPen Inject 0.15 mLs (15 Units total) into the skin 2 (two) times daily. 15 mL 2  . Insulin Pen Needle 32G X 4 MM MISC 32 g by Does not apply route 2 (two) times daily. 90 each 5  . KLOR-CON 10 10 MEQ tablet TAKE 1 TABLET (10 MEQ TOTAL) BY MOUTH DAILY. 90 tablet 1  . potassium chloride (K-DUR) 10 MEQ tablet Take 1 tablet (10 mEq total) by mouth daily. 90 tablet 3  . rosuvastatin (CRESTOR) 20 MG tablet Take 1 tablet (20 mg total) by mouth daily. 90 tablet 3  .  rosuvastatin (CRESTOR) 20 MG tablet TAKE 1 TABLET BY MOUTH EVERY DAY 90 tablet 1   No current facility-administered medications for this visit.     Family History  Problem Relation Age of Onset  . Heart disease Father   . Hypertension Father   . Heart disease Brother   . Cancer Neg Hx   . Stroke Neg Hx   . Diabetes Neg Hx     Review of Systems  Constitutional: Negative.   HENT: Negative.   Eyes: Negative.   Respiratory: Negative.   Cardiovascular: Negative.   Gastrointestinal: Negative.   Endocrine: Negative.   Genitourinary: Negative.   Musculoskeletal: Negative.   Skin: Negative.   Allergic/Immunologic: Negative.   Neurological: Negative.   Hematological: Negative.   Psychiatric/Behavioral: Negative.     Exam:   BP (!) 148/82 (BP Location: Right Arm, Patient Position: Sitting, Cuff  Size: Large)   Pulse 64   Resp 14   Ht 5\' 8"  (1.727 m)   Wt 263 lb (119.3 kg)   LMP 11/09/2013   BMI 39.99 kg/m   Weight change: @WEIGHTCHANGE @ Height:   Height: 5\' 8"  (172.7 cm)  Ht Readings from Last 3 Encounters:  03/20/16 5\' 8"  (1.727 m)  11/04/15 5\' 9"  (1.753 m)  08/02/15 5\' 9"  (1.753 m)    General appearance: alert, cooperative and appears stated age Head: Normocephalic, without obvious abnormality, atraumatic Neck: no adenopathy, supple, symmetrical, trachea midline and thyroid enlarged Lungs: clear to auscultation bilaterally Cardiovascular: regular rate and rhythm Breasts: normal appearance, no masses or tenderness Heart: regular rate and rhythm Abdomen: soft, non-tender; bowel sounds normal; no masses,  no organomegaly Extremities: extremities normal, atraumatic, no cyanosis or edema Skin: Skin color, texture, turgor normal. No rashes or lesions Lymph nodes: Cervical, supraclavicular, and axillary nodes normal. No abnormal inguinal nodes palpated Neurologic: Grossly normal   Pelvic: External genitalia:  no lesions              Urethra:  normal appearing urethra with no masses, tenderness or lesions              Bartholins and Skenes: normal                 Vagina: normal appearing vagina with normal color and discharge, no lesions              Cervix: no lesions               Bimanual Exam:  Uterus:  not appreciably enlarged, non tender, mobile              Adnexa: no mass, fullness, tenderness               Rectovaginal: Confirms               Anus:  normal sphincter tone, no lesions  Chaperone was present for exam.  A:  Well Woman with normal exam   H/O LSIL pap in 2015 with a negative colposcopy and ECC  P:   Pap with hpv  Mammogram this week  Colonoscopy UTD  Discussed breast self exam  Discussed calcium and vit D intake

## 2016-03-22 ENCOUNTER — Telehealth: Payer: Self-pay

## 2016-03-22 NOTE — Telephone Encounter (Signed)
[facility] called and asked for you to sign orders that were sent to your in basket on 03/15/2016 for diagnostic ultrasound of Rt breast. Pt has appt tomorrow. [phone] ext [phone].

## 2016-03-23 ENCOUNTER — Telehealth: Payer: Self-pay | Admitting: Medical

## 2016-03-23 ENCOUNTER — Ambulatory Visit
Admission: RE | Admit: 2016-03-23 | Discharge: 2016-03-23 | Disposition: A | Discharge status: Home / Self Care | Payer: BC Managed Care – PPO | Source: Ambulatory Visit | Attending: Medical | Admitting: Medical

## 2016-03-23 DIAGNOSIS — N6310 Unspecified lump in the right breast, unspecified quadrant: Secondary | ICD-10-CM

## 2016-03-23 LAB — IPS PAP TEST WITH HPV

## 2016-03-23 NOTE — Telephone Encounter (Signed)
Pt is coming in Monday at 8.30 to discuss these issues

## 2016-03-23 NOTE — Telephone Encounter (Signed)
Amy Rosario - if her employer wrote a letter concerned about SOB, I don;t think she has mentioned any chest pain or SOB to me.  Thus, lets get her back in, we will need to check EKG and go from there.

## 2016-03-23 NOTE — Telephone Encounter (Signed)
Pt dropped off fmla paper work to be filled out, supervisor put letter in there about symptoms she has witnessed the patient having, pt can be reached at [phone] states she is waiting to have a MRI also  Put in you folder

## 2016-03-26 ENCOUNTER — Telehealth: Payer: Self-pay | Admitting: *Deleted

## 2016-03-26 ENCOUNTER — Encounter: Payer: Self-pay | Admitting: Medical

## 2016-03-26 ENCOUNTER — Ambulatory Visit
Admission: RE | Admit: 2016-03-26 | Discharge: 2016-03-26 | Disposition: A | Discharge status: Home / Self Care | Payer: BC Managed Care – PPO | Source: Ambulatory Visit | Attending: Medical | Admitting: Medical

## 2016-03-26 ENCOUNTER — Ambulatory Visit (INDEPENDENT_AMBULATORY_CARE_PROVIDER_SITE_OTHER): Payer: BC Managed Care – PPO | Admitting: Medical

## 2016-03-26 VITALS — BP 180/100 | HR 68 | Resp 16 | Ht 69.0 in | Wt 264.4 lb

## 2016-03-26 DIAGNOSIS — R634 Abnormal weight loss: Secondary | ICD-10-CM

## 2016-03-26 DIAGNOSIS — R079 Chest pain, unspecified: Secondary | ICD-10-CM | POA: Diagnosis not present

## 2016-03-26 DIAGNOSIS — IMO0001 Reserved for inherently not codable concepts without codable children: Secondary | ICD-10-CM

## 2016-03-26 DIAGNOSIS — I10 Essential (primary) hypertension: Secondary | ICD-10-CM

## 2016-03-26 DIAGNOSIS — E118 Type 2 diabetes mellitus with unspecified complications: Secondary | ICD-10-CM | POA: Diagnosis not present

## 2016-03-26 DIAGNOSIS — Z9989 Dependence on other enabling machines and devices: Secondary | ICD-10-CM

## 2016-03-26 DIAGNOSIS — N289 Disorder of kidney and ureter, unspecified: Secondary | ICD-10-CM

## 2016-03-26 DIAGNOSIS — R42 Dizziness and giddiness: Secondary | ICD-10-CM | POA: Diagnosis not present

## 2016-03-26 DIAGNOSIS — R935 Abnormal findings on diagnostic imaging of other abdominal regions, including retroperitoneum: Secondary | ICD-10-CM

## 2016-03-26 DIAGNOSIS — R0602 Shortness of breath: Secondary | ICD-10-CM

## 2016-03-26 DIAGNOSIS — G4733 Obstructive sleep apnea (adult) (pediatric): Secondary | ICD-10-CM | POA: Diagnosis not present

## 2016-03-26 DIAGNOSIS — Z794 Long term (current) use of insulin: Secondary | ICD-10-CM

## 2016-03-26 DIAGNOSIS — E669 Obesity, unspecified: Secondary | ICD-10-CM

## 2016-03-26 DIAGNOSIS — R748 Abnormal levels of other serum enzymes: Secondary | ICD-10-CM

## 2016-03-26 LAB — COMPREHENSIVE METABOLIC PANEL
ALK PHOS: 162 U/L — AB (ref 33–130)
ALT: 23 U/L (ref 6–29)
AST: 34 U/L (ref 10–35)
Albumin: 3.7 g/dL (ref 3.6–5.1)
BUN: 23 mg/dL (ref 7–25)
CHLORIDE: 98 mmol/L (ref 98–110)
CO2: 32 mmol/L — ABNORMAL HIGH (ref 20–31)
Calcium: 11.2 mg/dL — ABNORMAL HIGH (ref 8.6–10.4)
Creat: 1.51 mg/dL — ABNORMAL HIGH (ref 0.50–1.05)
Glucose, Bld: 96 mg/dL (ref 65–99)
POTASSIUM: 3.3 mmol/L — AB (ref 3.5–5.3)
Sodium: 140 mmol/L (ref 135–146)
TOTAL PROTEIN: 8.1 g/dL (ref 6.1–8.1)
Total Bilirubin: 0.5 mg/dL (ref 0.2–1.2)

## 2016-03-26 MED ORDER — MECLIZINE HCL 25 MG PO TABS: 25.0000 mg | ORAL_TABLET | Freq: Two times a day (BID) | ORAL | 0 refills | Status: DC

## 2016-03-26 NOTE — Progress Notes (Signed)
Subjective: Chief Complaint  Patient presents with  . Chest Pain    and SOB as well as dizziness that comes and goes. First started at work last Wednesday and Thursday again. Her supervisor wants her checked out. Also has FMLA paperwork that was dropped off last Friday that she needs filled out.    She notes having some chest pain 2 days last week.  One of the times last week with chest pain, felt dizzy, room seemed to be spinning, then felt the pain.   Went to the bathroom, back was hurting a bit.   Chest pain lasted seconds.  wasn't worse with activity.  Chest pain didn't radiate.  Did get some nausea and sweaty.   Felt SOB.  Second time she had chest pain was similar.  dizziness is room spinning sensation.  Dizziness can last seconds to minutes, is intermittent though.   Feels like room spinning, no syncope.      Saw Dr. Terrence Dupont in the past, had Echo, thinks it was 02/2015.  Doesn't remember results, but thinks it was normal.   Has had stress test as well.   Has OSA, on CPAP, uses most nights.  Lately having some SOB, but last week was when she started getting SOB, chest pain.   Has cough intermittent.  No swelling.  Home BPs are running 130/80s.  Blood sugars running 80-130s.    Given her dizziness and other symptoms , been out of work January 6-9, January 13, 16, and 2/1, 2/2.    Past Medical History:  Diagnosis Date  . Back pain   . Diabetes mellitus without complication ([place])   . Goiter    left side, saw dr Khaila Velarde april 2015 for, had biopsy done was told it is cyst  . H/O echocardiogram    Dr. Charolette [facility], [facility]  . Hyperlipidemia   . Hypertension 2000  . Impaired fasting blood sugar 2014  . Obesity   . Other screening mammogram    planned for 05/2013  . Routine gynecological examination    last pap 2012  . Sleep apnea    on CPAP setting of 2   Current Outpatient Prescriptions on File Prior to Visit  Medication Sig Dispense Refill  .  allopurinol (ZYLOPRIM) 100 MG tablet Take 1 tablet (100 mg total) by mouth daily. 30 tablet 2  . Amlodipine-Valsartan-HCTZ (EXFORGE HCT) 10-320-25 MG TABS Take 1 tablet by mouth daily. 90 tablet 3  . aspirin 81 MG tablet Take 1 tablet (81 mg total) by mouth daily. 90 tablet 3  . atenolol (TENORMIN) 100 MG tablet Take 1 tablet (100 mg total) by mouth daily. 90 tablet 3  . cloNIDine (CATAPRES) 0.2 MG tablet Take 1 tablet (0.2 mg total) by mouth 2 (two) times daily. 180 tablet 3  . gabapentin (NEURONTIN) 100 MG capsule Take 1 capsule (100 mg total) by mouth at bedtime. 30 capsule 2  . insulin aspart protamine - aspart (NOVOLOG MIX 70/30 FLEXPEN) (70-30) 100 UNIT/ML FlexPen Inject 0.15 mLs (15 Units total) into the skin 2 (two) times daily. (Patient taking differently: Inject 22 Units into the skin 2 (two) times daily. ) 15 mL 2  . Insulin Pen Needle 32G X 4 MM MISC 32 g by Does not apply route 2 (two) times daily. 90 each 5  . KLOR-CON 10 10 MEQ tablet TAKE 1 TABLET (10 MEQ TOTAL) BY MOUTH DAILY. 90 tablet 1  . rosuvastatin (CRESTOR) 20 MG tablet Take 1 tablet (20 mg  total) by mouth daily. 90 tablet 3   No current facility-administered medications on file prior to visit.    Past Surgical History:  Procedure Laterality Date  . COLONOSCOPY WITH PROPOFOL N/A 09/21/2013   Procedure: COLONOSCOPY WITH PROPOFOL;  Surgeon: Juanita Craver, MD;  Location: [facility];  Service: Endoscopy;  Laterality: N/A;  . CYST EXCISION     right low back  . TUBAL LIGATION      ROS as in subjective   Objective: BP (!) 180/100 (BP Location: Left Arm, Patient Position: Sitting, Cuff Size: Normal)   Pulse 68   Resp 16   Ht 5\' 9"  (1.753 m)   Wt 264 lb 6.4 oz (119.9 kg)   LMP 11/09/2013   SpO2 98%   BMI 39.05 kg/m    Wt Readings from Last 3 Encounters:  03/26/16 264 lb 6.4 oz (119.9 kg)  03/20/16 263 lb (119.3 kg)  03/08/16 264 lb 12.8 oz (120.1 kg)    BP Readings from Last 3 Encounters:  03/26/16 (!)  180/100  03/20/16 (!) 148/82  03/08/16 (!) 150/90    General appearance: alert, no distress, WD/WN,  HEENT: normocephalic, sclerae anicteric, PERRLA, EOMi, nares patent, no discharge or erythema, pharynx normal Oral cavity: MMM, no lesions Neck: supple, no lymphadenopathy, no thyromegaly, no masses, no bruits Heart: RRR, normal S1, S2, no murmurs Lungs: CTA bilaterally, no wheezes, rhonchi, or rales Abdomen: +bs, soft, non tender, non distended, no masses, no hepatomegaly, no splenomegaly Extremities: no edema, no cyanosis, no clubbing Pulses: 2+ symmetric, upper and lower extremities, normal cap refill Neurological: alert, oriented x 3, CN2-12 intact, strength normal upper extremities and lower extremities, sensation normal throughout, DTRs 2+ throughout, no cerebellar signs, gait normal Psychiatric: normal affect, behavior normal, pleasant     Adult ECG Report  Indication: chest pain  Rate: 62 bpm  Rhythm: normal sinus rhythm  QRS Axis: 20 degrees  PR Interval: 176ms  QRS Duration: 32ms  QTc: 342ms  Conduction Disturbances: none  Other Abnormalities: none  Patient's cardiac risk factors are: diabetes mellitus, dyslipidemia, hypertension and obesity (BMI >= 30 kg/m2).  EKG comparison: none  Narrative Interpretation: unremarkable    Assessment: Encounter Diagnoses  Name Primary?  . Chest pain, unspecified type Yes  . Essential hypertension, benign   . Renal insufficiency   . OSA on CPAP   . Type 2 diabetes mellitus with complication, with long-term current use of insulin ([place])   . Abnormal ultrasound of abdomen   . Weight loss   . Class 3 obesity with serious comorbidity in adult, unspecified BMI, unspecified obesity type   . Elevated alkaline phosphatase level   . Dizziness     Plan: Chest pain, SOB - etiology unclear.  EKG unremarkable.   Will send for CXR, recheck some labs.  Will request records from Dr. Lyla Son cardiology.   advised she go ahead and call Dr.  Zenia Resides office for f/u appt.  HTN - our readings our high.  Her home readings are fine.   C/t same medications, labs today, and advised she bring her cuff in to compare with ours  Dizziness, possible vertigo - begin trial of Meclizine, hydrate well, don't drive if too dizzy.  Will work on [employer] forms  Weight loss - weight is stable on last few visits.  In the last month given her weight loss, we had started the process of ruling out causes.   Insurance was holding [place] upon on MRI of abdomen pelvis, and this is still  pending.   fortunately in the meantime she saw gynecology, had repeat normal pap,, mammogram recently was normal, and coloscopy is up to date from 2015.  CXR today, repeat labs today  Renal insufficiency - this was a recent lab findings.  Repeat labs today, hydrate well, consider differential  Diabetes - home sugars have been stable and within goal.  C/t same medications  Advised she use CPAP daily.  F/u pending labs, CXR, cardiology f/u, next steps.    [place] was seen today for chest pain.  Diagnoses and all orders for this visit:  Chest pain, unspecified type -     EKG 12-Lead -     Comprehensive metabolic panel -     Brain natriuretic peptide -     DG Chest 2 View; Future  Essential hypertension, benign -     Comprehensive metabolic panel -     Brain natriuretic peptide  Renal insufficiency -     Comprehensive metabolic panel -     Brain natriuretic peptide  OSA on CPAP  Type 2 diabetes mellitus with complication, with long-term current use of insulin ([facility])  Abnormal ultrasound of abdomen  Weight loss  Class 3 obesity with serious comorbidity in adult, unspecified BMI, unspecified obesity type  Elevated alkaline phosphatase level  Dizziness  SOB (shortness of breath) -     DG Chest 2 View; Future  Other orders -     meclizine (ANTIVERT) 25 MG tablet; Take 1 tablet (25 mg total) by mouth 2 (two) times daily.

## 2016-03-26 NOTE — Telephone Encounter (Signed)
Spoke with patient and went over results. Patient denies SX of BV. NO RX called in. Patient placed in 08 recall -eh

## 2016-03-26 NOTE — Telephone Encounter (Signed)
Left message to call regarding lab results -eh 

## 2016-03-26 NOTE — Telephone Encounter (Signed)
-----   Message from Salvadore Dom, MD sent at 03/26/2016 11:27 AM EST ----- Pap and hpv are negative, last pap was LSIL, so she needs another pap in 1 year. Please inform. She also had BV on pap, if symptomatic only then treat with: either oral flagyl, 500 mg po bid x 7 days or metrogel, 1 applicator vaginally qhs x 5 days. No ETOH with flagyl.

## 2016-03-27 ENCOUNTER — Other Ambulatory Visit: Payer: Self-pay

## 2016-03-27 ENCOUNTER — Telehealth: Payer: Self-pay

## 2016-03-27 DIAGNOSIS — D869 Sarcoidosis, unspecified: Secondary | ICD-10-CM

## 2016-03-27 LAB — BRAIN NATRIURETIC PEPTIDE: BRAIN NATRIURETIC PEPTIDE: 32.9 pg/mL (ref <100)

## 2016-03-27 NOTE — Telephone Encounter (Signed)
Pt ct scan is set up for 03/30/2016 140 pm  At [facility]  Pt is aware.

## 2016-03-28 ENCOUNTER — Encounter: Payer: Self-pay | Admitting: Medical

## 2016-03-28 ENCOUNTER — Other Ambulatory Visit: Payer: Self-pay | Admitting: Medical

## 2016-03-28 ENCOUNTER — Telehealth: Payer: Self-pay

## 2016-03-28 DIAGNOSIS — R42 Dizziness and giddiness: Secondary | ICD-10-CM

## 2016-03-28 DIAGNOSIS — R634 Abnormal weight loss: Secondary | ICD-10-CM

## 2016-03-28 DIAGNOSIS — R9389 Abnormal findings on diagnostic imaging of other specified body structures: Secondary | ICD-10-CM

## 2016-03-28 NOTE — Telephone Encounter (Signed)
Records from Dr. Terrence Dupont placed in your folder for review. Amy Rosario

## 2016-03-29 ENCOUNTER — Telehealth: Payer: Self-pay | Admitting: Medical

## 2016-03-29 DIAGNOSIS — Z0279 Encounter for issue of other medical certificate: Secondary | ICD-10-CM

## 2016-03-29 NOTE — Telephone Encounter (Signed)
Sent approval to [place],

## 2016-03-29 NOTE — Telephone Encounter (Signed)
Return FMLA, charge customary fee.    Let her know that I wrote out for 03/26/16-04/19/16 to give her a chance to see pulmonology, have head scan, and get some improvement of symptoms.     One of the forms that she mailed to me regarding leave of absence is for the employee to fill out, not [place].  She may need to be out longer, but pulmonology will have to decide on this.

## 2016-03-29 NOTE — Telephone Encounter (Signed)
[facility] called stating that pt has appointment to get a CT of head on tomorrow as ordered by [name] but this imaging will need a precert

## 2016-03-29 NOTE — Telephone Encounter (Signed)
Called pt and advised of Shane's instructions and of $25 form completion fee. She understood & will come by to pick up forms and pay the fee.

## 2016-03-30 ENCOUNTER — Ambulatory Visit
Admission: RE | Admit: 2016-03-30 | Discharge: 2016-03-30 | Disposition: A | Discharge status: Home / Self Care | Payer: BC Managed Care – PPO | Source: Ambulatory Visit | Attending: Medical | Admitting: Medical

## 2016-03-30 ENCOUNTER — Other Ambulatory Visit: Payer: Self-pay | Admitting: Medical

## 2016-03-30 DIAGNOSIS — R9389 Abnormal findings on diagnostic imaging of other specified body structures: Secondary | ICD-10-CM

## 2016-03-30 DIAGNOSIS — D869 Sarcoidosis, unspecified: Secondary | ICD-10-CM

## 2016-03-30 DIAGNOSIS — R634 Abnormal weight loss: Secondary | ICD-10-CM

## 2016-03-30 DIAGNOSIS — R42 Dizziness and giddiness: Secondary | ICD-10-CM

## 2016-03-30 MED ORDER — CLARITHROMYCIN 500 MG PO TABS: 500.0000 mg | ORAL_TABLET | Freq: Two times a day (BID) | ORAL | 0 refills | Status: DC

## 2016-04-16 ENCOUNTER — Institutional Professional Consult (permissible substitution): Payer: BC Managed Care – PPO | Admitting: Internal Medicine

## 2016-04-18 ENCOUNTER — Ambulatory Visit (INDEPENDENT_AMBULATORY_CARE_PROVIDER_SITE_OTHER): Payer: BC Managed Care – PPO | Admitting: Internal Medicine

## 2016-04-18 ENCOUNTER — Encounter: Payer: Self-pay | Admitting: Internal Medicine

## 2016-04-18 ENCOUNTER — Other Ambulatory Visit (INDEPENDENT_AMBULATORY_CARE_PROVIDER_SITE_OTHER): Payer: BC Managed Care – PPO

## 2016-04-18 VITALS — BP 132/80 | HR 73 | Ht 69.0 in | Wt 263.0 lb

## 2016-04-18 DIAGNOSIS — D869 Sarcoidosis, unspecified: Secondary | ICD-10-CM | POA: Diagnosis not present

## 2016-04-18 LAB — SEDIMENTATION RATE: SED RATE: 90 mm/h — AB (ref 0–30)

## 2016-04-18 NOTE — Patient Instructions (Addendum)
Sarcoidosis is a benign inflammatory condition caused by  The  immune system being too revved up like a thermostat on your furnace that's partially  stuck causing arthitis, rash, short of breath and cough and vision issues.  It typically burns itself out in 75% of patients by the end of 3 years with little to indicate that we really change the natural course of the disease by aggressive treatments intended to alter it.  Treatment is generally reserved for patients with major symptoms we can attribute to sarcoid or if a vital organ (like the eye or kidney or nervous system) becomes affected.    Please see patient coordinator before you leave today  to schedule dermatology for skin biopsy  Please remember to go to the lab  department downstairs in the basement  for your tests - we will call you with the results when they are available.  Please schedule a follow up office visit in 4 weeks, sooner if needed with pfts and cxr

## 2016-04-18 NOTE — Progress Notes (Signed)
Subjective:     Patient ID: Amy Rosario, female   DOB: 1960-05-12,    MRN: [id number]  HPI  75 yobf with MO never smoker complicated around 5573 then abd swelling Feb 2018 > ? Sarcoid > referred to pulmonary clinic 04/18/2016 by Elray Mcgregor PA   04/18/2016 1st [facility] office visit/ Jermany Rimel   Chief Complaint  Patient presents with  . Pulmonary Consult    Referred by Dr. Chana Bode for eval of Sarcoidosis. Pt states she was just dxed recently. She started having SOB in mid Feb 2018.  She gets winded walking short distances such as to her mailbox and back.  She also c/o cough- prod at times with minimal yellow to clear sputum. She states she has recently noticed bumps on her face.   onset of sob was indolent really since first of 2018 assoc with abd swelling and  mostly dry cough then noted "red bumps" on face and some aching of elbows but not ankles or wrists. No viz symptoms/ some anorexia but min wt loss   Doe = MMRC1 = can walk nl pace, flat grade, can't hurry or go uphills or steps s sob    No obvious day to day or daytime variability or assoc excess/ purulent sputum or mucus plugs or hemoptysis or cp or chest tightness, subjective wheeze or overt sinus or hb symptoms. No unusual exp hx or h/o childhood pna/ asthma or knowledge of premature birth.  Sleeping ok without nocturnal  or early am exacerbation  of respiratory  c/o's or need for noct saba. Also denies any obvious fluctuation of symptoms with weather or environmental changes or other aggravating or alleviating factors except as outlined above   Current Medications, Allergies, Complete Past Medical History, Past Surgical History, Family History, and Social History were reviewed in [employer] record.  ROS  The following are not active complaints unless bolded sore throat, dysphagia, dental problems, itching, sneezing,  nasal congestion or excess/ purulent secretions, ear ache,   fever, chills,  sweats, unintended wt loss, classically pleuritic or exertional cp,  orthopnea pnd or leg swelling, presyncope, palpitations, abdominal pain, anorexia, nausea, vomiting, diarrhea  or change in bowel or bladder habits, change in stools or urine, dysuria,hematuria,  rash, arthralgias, visual complaints, headache, numbness, weakness or ataxia or problems with walking or coordination,  change in mood/affect or memory.           Review of Systems     Objective:   Physical Exam    amb obese bf nad   Wt Readings from Last 3 Encounters:  04/18/16 263 lb (119.3 kg)  03/26/16 264 lb 6.4 oz (119.9 kg)  03/20/16 263 lb (119.3 kg)   9/6/ 2017            295  Vital signs reviewed - Note on arrival 02 sats  94% on RA     HEENT: nl dentition, turbinates bilaterally, and oropharynx. Nl external ear canals without cough reflex   NECK :  without JVD/Nodes/TM/ nl carotid upstrokes bilaterally   LUNGS: no acc muscle use,  Nl contour chest which is clear to A and P bilaterally without cough on insp or exp maneuvers   CV:  RRR  no s3 or murmur or increase in P2, and no edema   ABD:  massivley obese but soft and nontender with nl inspiratory excursion in the supine position. No bruits or organomegaly appreciated, bowel sounds nl  MS:  Nl gait/ ext  warm without deformities, calf tenderness, cyanosis or clubbing No obvious joint restrictions   SKIN: warm and dry with pink to purplish papules over lip and chin / neg involvement of ext surfaces or arms or legs   NEURO:  alert, approp, nl sensorium with  no motor or cerebellar deficits apparent.     I personally reviewed images and agree with radiology impression as follows:  CXR:   03/26/16 Bilateral interstitial prominence which is both acute and chronic. Hilar prominence as well. The findings could reflect acute on chronic infection but an entity such as sarcoidosis could produce similar findings.   Labs ordered 04/18/2016     Lab Results   Component Value Date   ESRSEDRATE 90 (H) 04/18/2016   ACE level 04/18/2016 >>>     Assessment:

## 2016-04-18 NOTE — Assessment & Plan Note (Addendum)
ESR 04/18/2016 =  90  ACE 04/18/2016  Ordered   This is almost certainly sarcoid with skin and LN involvement and best way to obtain tissue dx is skin bx  - the issue p this will be whether rx with steroids is appropriate and at present I suspect the risk is > benefit due to her obesity/ aodm and might consider a trial of plaquenil first  Discussed in detail all the  indications, usual  risks and alternatives  relative to the benefits with patient who agrees to proceed with w/u as outlined   Also discussed etiology and natural hx of sarcoid using the stuck thermostat analogy  Total time devoted to counseling  > 50 % of initial 60 min office visit:  review case with pt/ discussion of options/alternatives/ personally creating written customized instructions  in presence of pt  then going over those specific  Instructions directly with the pt including how to use all of the meds but in particular covering each new medication in detail and the difference between the maintenance= "automatic" meds and the prns using an action plan format for the latter (If this problem/symptom => do that organization reading Left to right).  Please see AVS from this visit for a full list of these instructions which I personally wrote for this pt and  are unique to this visit.

## 2016-04-19 NOTE — Assessment & Plan Note (Addendum)
Body mass index is 38.84 - trending down since 10/2015  Lab Results  Component Value Date   TSH 3.60 03/08/2016     Contributing to gerd risk/ doe/reviewed the need and the process to achieve and maintain neg calorie balance > defer f/u primary care including intermittently monitoring thyroid status

## 2016-04-25 ENCOUNTER — Telehealth: Payer: Self-pay | Admitting: Internal Medicine

## 2016-04-25 NOTE — Telephone Encounter (Signed)
Forms placed in MW's lookat for completion

## 2016-04-26 NOTE — Telephone Encounter (Signed)
Rec'd FMLA forms back - fwd to [employer] via interoffice mail - 04/26/16 -pr

## 2016-04-26 NOTE — Telephone Encounter (Signed)
done

## 2016-05-18 ENCOUNTER — Other Ambulatory Visit: Payer: Self-pay | Admitting: Medical

## 2016-05-18 ENCOUNTER — Telehealth: Payer: Self-pay | Admitting: Medical

## 2016-05-18 DIAGNOSIS — M79672 Pain in left foot: Secondary | ICD-10-CM

## 2016-05-18 DIAGNOSIS — M792 Neuralgia and neuritis, unspecified: Secondary | ICD-10-CM

## 2016-05-18 DIAGNOSIS — E119 Type 2 diabetes mellitus without complications: Secondary | ICD-10-CM

## 2016-05-18 DIAGNOSIS — M79671 Pain in right foot: Secondary | ICD-10-CM

## 2016-05-18 MED ORDER — ALLOPURINOL 100 MG PO TABS: 100.0000 mg | ORAL_TABLET | Freq: Every day | ORAL | 2 refills | Status: DC

## 2016-05-18 MED ORDER — GABAPENTIN 100 MG PO CAPS: 100.0000 mg | ORAL_CAPSULE | Freq: Every day | ORAL | 2 refills | Status: DC

## 2016-05-18 NOTE — Telephone Encounter (Signed)
I did send those medication

## 2016-05-18 NOTE — Telephone Encounter (Signed)
Pt called stating that she never picked up scripts for Allopurinol and Gabapentin. Does she need to be on these meds? If so she will need new scripts sent.

## 2016-06-01 ENCOUNTER — Other Ambulatory Visit: Payer: Self-pay

## 2016-06-01 DIAGNOSIS — D869 Sarcoidosis, unspecified: Secondary | ICD-10-CM

## 2016-06-04 ENCOUNTER — Ambulatory Visit (INDEPENDENT_AMBULATORY_CARE_PROVIDER_SITE_OTHER)
Admission: RE | Admit: 2016-06-04 | Discharge: 2016-06-04 | Disposition: A | Discharge status: Home / Self Care | Payer: BC Managed Care – PPO | Source: Ambulatory Visit | Attending: Internal Medicine | Admitting: Internal Medicine

## 2016-06-04 ENCOUNTER — Ambulatory Visit (INDEPENDENT_AMBULATORY_CARE_PROVIDER_SITE_OTHER): Payer: BC Managed Care – PPO | Admitting: Internal Medicine

## 2016-06-04 ENCOUNTER — Encounter: Payer: Self-pay | Admitting: Internal Medicine

## 2016-06-04 ENCOUNTER — Other Ambulatory Visit (INDEPENDENT_AMBULATORY_CARE_PROVIDER_SITE_OTHER): Payer: BC Managed Care – PPO

## 2016-06-04 VITALS — BP 118/80 | HR 72 | Ht 69.0 in | Wt 267.0 lb

## 2016-06-04 DIAGNOSIS — D869 Sarcoidosis, unspecified: Secondary | ICD-10-CM

## 2016-06-04 DIAGNOSIS — I10 Essential (primary) hypertension: Secondary | ICD-10-CM | POA: Diagnosis not present

## 2016-06-04 LAB — PULMONARY FUNCTION TEST
DL/VA % PRED: 105 %
DL/VA: 5.61 ml/min/mmHg/L
DLCO cor % pred: 63 %
DLCO cor: 19.49 ml/min/mmHg
DLCO unc % pred: 64 %
DLCO unc: 19.73 ml/min/mmHg
FEF 25-75 PRE: 1.11 L/s
FEF 25-75 Post: 1.03 L/sec
FEF2575-%CHANGE-POST: -7 %
FEF2575-%PRED-PRE: 41 %
FEF2575-%Pred-Post: 38 %
FEV1-%Change-Post: -1 %
FEV1-%Pred-Post: 64 %
FEV1-%Pred-Pre: 64 %
FEV1-Post: 1.72 L
FEV1-Pre: 1.74 L
FEV1FVC-%Change-Post: 11 %
FEV1FVC-%Pred-Pre: 87 %
FEV6-%CHANGE-POST: -10 %
FEV6-%PRED-PRE: 74 %
FEV6-%Pred-Post: 66 %
FEV6-POST: 2.18 L
FEV6-PRE: 2.44 L
FEV6FVC-%Change-Post: 0 %
FEV6FVC-%PRED-POST: 103 %
FEV6FVC-%PRED-PRE: 102 %
FVC-%Change-Post: -11 %
FVC-%PRED-POST: 64 %
FVC-%Pred-Pre: 73 %
FVC-POST: 2.18 L
FVC-Pre: 2.46 L
POST FEV6/FVC RATIO: 100 %
PRE FEV1/FVC RATIO: 71 %
Post FEV1/FVC ratio: 79 %
Pre FEV6/FVC Ratio: 99 %
RV % pred: 60 %
RV: 1.27 L
TLC % pred: 62 %
TLC: 3.59 L

## 2016-06-04 LAB — SEDIMENTATION RATE: Sed Rate: 119 mm/hr — ABNORMAL HIGH (ref 0–30)

## 2016-06-04 MED ORDER — PREDNISONE 10 MG PO TABS: ORAL_TABLET | ORAL | 0 refills | Status: DC

## 2016-06-04 MED ORDER — BISOPROLOL FUMARATE 10 MG PO TABS: 10.0000 mg | ORAL_TABLET | Freq: Every day | ORAL | 11 refills | Status: DC

## 2016-06-04 NOTE — Progress Notes (Signed)
Subjective:     Patient ID: Amy Rosario, female   DOB: [birth year],    MRN: [id number]  HPI  68 yobf with MO never smoker complicated   then abd swelling Feb 2018 > ? Sarcoid > referred to pulmonary clinic 04/18/2016 by Elray Mcgregor PA   04/18/2016 1st [facility] office visit/ Amy Rosario   Chief Complaint  Patient presents with  . Pulmonary Consult    Referred by Dr. Chana Bode for eval of Sarcoidosis. Pt states she was just dxed recently. She started having SOB in mid Feb 2018.  She gets winded walking short distances such as to her mailbox and back.  She also c/o cough- prod at times with minimal yellow to clear sputum. She states she has recently noticed bumps on her face.   onset of sob was indolent really since first of 2018 assoc with abd swelling and  mostly dry cough then noted "red bumps" on face and some aching of elbows but not ankles or wrists. No viz symptoms/ some anorexia but min wt loss  Doe = MMRC1 = can walk nl pace, flat grade, can't hurry or go uphills or steps s sob   rec Sarcoidosis is a benign inflammatory condition  Treatment is generally reserved for patients with major symptoms  schedule dermatology for skin biopsy Please remember to go to the lab  department downstairs in the basement  for your tests - we will call you with the results when they are available.    06/04/2016  f/u ov/Wrenley Sayed re:  Sarcoidosis  Chief Complaint  Patient presents with  . Follow-up    4 wk f/u for sarcoidosis.   new problem = stiffness and swelling in hands / had skin bx but doesn't know her doctor's name   No change doe = mmrc 1   No obvious day to day or daytime variability or assoc excess/ purulent sputum or mucus plugs or hemoptysis or cp or chest tightness, subjective wheeze or overt sinus or hb symptoms. No unusual exp hx or h/o childhood pna/ asthma or knowledge of premature birth.  Sleeping ok without nocturnal  or early am exacerbation  of respiratory  c/o's or need for  noct saba. Also denies any obvious fluctuation of symptoms with weather or environmental changes or other aggravating or alleviating factors except as outlined above   Current Medications, Allergies, Complete Past Medical History, Past Surgical History, Family History, and Social History were reviewed in [employer] record.  ROS  The following are not active complaints unless bolded sore throat, dysphagia, dental problems, itching, sneezing,  nasal congestion or excess/ purulent secretions, ear ache,   fever, chills, sweats, unintended wt loss, classically pleuritic or exertional cp,  orthopnea pnd or leg swelling, presyncope, palpitations, abdominal pain, anorexia, nausea, vomiting, diarrhea  or change in bowel or bladder habits, change in stools or urine, dysuria,hematuria,  rash, arthralgias, visual complaints, headache, numbness, weakness or ataxia or problems with walking or coordination,  change in mood/affect or memory.                          Objective:   Physical Exam    amb obese bf nad     06/04/2016       267   04/18/16 263 lb (119.3 kg)  03/26/16 264 lb 6.4 oz (119.9 kg)  03/20/16 263 lb (119.3 kg)   9/6/ 2017  295  Vital signs reviewed   - Note on arrival 02 sats  97% on RA     HEENT: nl dentition, turbinates bilaterally, and oropharynx. Nl external ear canals without cough reflex   NECK :  without JVD/Nodes/TM/ nl carotid upstrokes bilaterally   LUNGS: no acc muscle use,  Nl contour chest which is clear to A and P bilaterally without cough on insp or exp maneuvers   CV:  RRR  no s3 or murmur or increase in P2, and no edema   ABD:  massivley obese but soft and nontender with nl inspiratory excursion in the supine position. No bruits or organomegaly appreciated, bowel sounds nl  MS:  Nl gait/ ext warm without deformities, calf tenderness, cyanosis or clubbing No obvious joint restrictions   SKIN: warm and dry  With several  slt hyperpigmented papules L face / several bx sites abd / back   NEURO:  alert, approp, nl sensorium with  no motor or cerebellar deficits apparent.       CXR PA and Lateral:   06/04/2016 :    I personally reviewed images and agree with radiology impression as follows:    Stable bilateral diffuse interstitial densities are noted most likely related to sarcoidosis. No significant change compared to prior exam.       Lab Results  Component Value Date   ESRSEDRATE 119 (H) 06/04/2016   ESRSEDRATE 90 (H) 04/18/2016    Labs ordered 06/04/2016   ACE level      Assessment:

## 2016-06-04 NOTE — Progress Notes (Signed)
PFT done today. 

## 2016-06-04 NOTE — Patient Instructions (Addendum)
Prednisone 10 mg take  4 each am x 2 days,   2 each am x 2 days,  1 each am x 2 days and stop   Please remember to go to the lab  department downstairs in the basement  for your tests - we will call you with the results when they are available.    Stop tenormin and start bisoprolol  10 mg daily   Ask your dermatologist to send me an update on any biopsy results   Please schedule a follow up office visit in 4 weeks, sooner if needed

## 2016-06-04 NOTE — Progress Notes (Signed)
Spoke with pt and notified of results per Dr. Wert. Pt verbalized understanding and denied any questions. 

## 2016-06-05 ENCOUNTER — Telehealth: Payer: Self-pay | Admitting: *Deleted

## 2016-06-05 LAB — ANGIOTENSIN CONVERTING ENZYME: ANGIOTENSIN-CONVERTING ENZYME: 260 U/L — AB (ref 9–67)

## 2016-06-05 NOTE — Assessment & Plan Note (Addendum)
Labs 03/08/16 :  Ca 12.4 with TP 8.8 and alb 3.9  ESR 04/18/2016 =  90  - Derm eval rec  04/19/2016    - ESR 06/04/2016  119 >  Prednisone 10 mg take  4 each am x 2 days,   2 each am x 2 days,  1 each am x 2 days and stop  - PFT's  06/04/2016  FVC  2.46 (73%)  No obst and no improvement from saba p nothing  prior to study with DLCO  64/63 % corrects to 105  % for alv volume   - ACE level 06/04/2016 >>>  With the skin rash/ arthralgias/ high prot/alb ratio and hypercalcemia this is almost certainly sarcoid with pulmonary/ skin/ joint involvement and if don't have a tissue dx by derm will need tbbx next before committing to longterm systemic steroids given risk of making obesity/ dm/ hbp worse  Discussed in detail all the  indications, usual  risks and alternatives  relative to the benefits with patient who agrees to proceed with conservative f/u as outlined    I had an extended discussion with the patient reviewing all relevant studies completed to date and  lasting 15 to 20 minutes of a 25 minute visit    Each maintenance medication was reviewed in detail including most importantly the difference between maintenance and prns and under what circumstances the prns are to be triggered using an action plan format that is not reflected in the computer generated alphabetically organized AVS.    Please see AVS for specific instructions unique to this visit that I personally wrote and verbalized to the the pt in detail and then reviewed with pt  by my nurse highlighting any  changes in therapy recommended at today's visit to their plan of care.

## 2016-06-05 NOTE — Telephone Encounter (Signed)
Spoke with the pt  She is unsure who her derm is  She will stop by their office and ask for a copy of the results of her bx and bring this with her to her next appt here

## 2016-06-05 NOTE — Assessment & Plan Note (Signed)
Changed tenormin to bisoprolol 06/04/2016 due to concern with asthmatic component   She has no airflow obst on pfts today but is at risk. Strongly prefer in this setting: Bystolic, the most beta -1  selective Beta blocker available in sample form, with bisoprolol the most selective generic choice  on the market.   rec try bisoprolol 10 mg daily   see avs for instructions unique to this ov

## 2016-06-05 NOTE — Assessment & Plan Note (Signed)
Complicated by dm/hbp  Body mass index is 39.43 kg/m.  -  trending up s pred rx yet Lab Results  Component Value Date   TSH 3.60 03/08/2016     Contributing to gerd risk/ doe/reviewed the need and the process to achieve and maintain neg calorie balance > defer f/u primary care including intermittently monitoring thyroid status

## 2016-06-05 NOTE — Progress Notes (Signed)
Spoke with pt and notified of results per Dr. Wert. Pt verbalized understanding and denied any questions. 

## 2016-06-05 NOTE — Telephone Encounter (Signed)
-----   Message from Tanda Rockers, MD sent at 06/05/2016  8:40 AM EDT ----- Need bx report per her dermatologist

## 2016-06-12 ENCOUNTER — Telehealth: Payer: Self-pay

## 2016-06-12 NOTE — Telephone Encounter (Signed)
Records faxed to [facility] per signed request in regards to sarcodosis. Amy Rosario

## 2016-06-20 ENCOUNTER — Encounter: Payer: Self-pay | Admitting: Gynecology

## 2016-06-29 ENCOUNTER — Telehealth: Payer: Self-pay | Admitting: Medical

## 2016-06-29 NOTE — Telephone Encounter (Signed)
Have her f/u with me soon as well

## 2016-06-29 NOTE — Telephone Encounter (Signed)
Pt states Dr. Melvyn Novas had her stop her Amlodipine & start Bisoprolol but ever since BP higher & BS is higher, advised needs appt but she states she has another app with Dr. Myrtis Ser.  I told her to take list of BP & BS readings to that appt and call back if has any other concerns.

## 2016-07-03 ENCOUNTER — Encounter: Payer: Self-pay | Admitting: Internal Medicine

## 2016-07-03 ENCOUNTER — Encounter: Payer: Self-pay | Admitting: *Deleted

## 2016-07-03 ENCOUNTER — Ambulatory Visit (INDEPENDENT_AMBULATORY_CARE_PROVIDER_SITE_OTHER): Payer: BC Managed Care – PPO | Admitting: Internal Medicine

## 2016-07-03 VITALS — BP 162/90 | HR 71 | Ht 69.0 in | Wt 276.0 lb

## 2016-07-03 DIAGNOSIS — D869 Sarcoidosis, unspecified: Secondary | ICD-10-CM

## 2016-07-03 NOTE — Progress Notes (Signed)
Subjective:     Patient ID: Amy Rosario, female   DOB: 1960/07/17     MRN: [id number]    Brief patient profile:  71 yobf with MO never smoker complicated   then abd swelling Feb 2018 > ? Sarcoid > referred to pulmonary clinic 04/18/2016 by Amy Mcgregor PA    History of Present Illness  04/18/2016 1st [facility] office visit/ Amy Rosario   Chief Complaint  Patient presents with  . Pulmonary Consult    Referred by Dr. Chana Rosario for eval of Sarcoidosis. Pt states she was just dxed recently. She started having SOB in mid Feb 2018.  She gets winded walking short distances such as to her mailbox and back.  She also c/o cough- prod at times with minimal yellow to clear sputum. She states she has recently noticed bumps on her face.   onset of sob was indolent really since first of 2018 assoc with abd swelling and  mostly dry cough then noted "red bumps" on face and some aching of elbows but not ankles or wrists. No viz symptoms/ some anorexia but min wt loss  Doe = MMRC1 = can walk nl pace, flat grade, can't hurry or go uphills or steps s sob   rec Sarcoidosis is a benign inflammatory condition  Treatment is generally reserved for patients with major symptoms  schedule dermatology for skin biopsy Please remember to go to the lab  department downstairs in the basement  for your tests - we will call you with the results when they are available.    06/04/2016  f/u ov/Amy Rosario re:  Sarcoidosis  Chief Complaint  Patient presents with  . Follow-up    4 wk f/u for sarcoidosis.   new problem = stiffness and swelling in hands / had skin bx but doesn't know her doctor's name that treats this "it's gout" No change doe = mmrc 1  rec Prednisone 10 mg take  4 each am x 2 days,   2 each am x 2 days,  1 each am x 2 days and stop  Please remember to go to the lab  department downstairs in the basement  for your tests - we will call you with the results when they are available. Stop tenormin and start  bisoprolol  10 mg daily    07/03/2016  f/u ov/Amy Rosario re: sarcoidosis  Chief Complaint  Patient presents with  . Follow-up    Pt c/o stiffness in her neck in the am's.  She also c/o swelling in both hands.      much better on pred, much worse off in terms of all her arthritis symptoms esp L hand / wrist   Doe continues at level = MMRC1  No obvious day to day or daytime variability or assoc excess/ purulent sputum or mucus plugs or hemoptysis or cp or chest tightness, subjective wheeze or overt sinus or hb symptoms. No unusual exp hx or h/o childhood pna/ asthma or knowledge of premature birth.  Sleeping ok without nocturnal  or early am exacerbation  of respiratory  c/o's or need for noct saba. Also denies any obvious fluctuation of symptoms with weather or environmental changes or other aggravating or alleviating factors except as outlined above   Current Medications, Allergies, Complete Past Medical History, Past Surgical History, Family History, and Social History were reviewed in [employer] record.  ROS  The following are not active complaints unless bolded sore throat, dysphagia, dental problems, itching, sneezing,  nasal  congestion or excess/ purulent secretions, ear ache,   fever, chills, sweats, unintended wt loss, classically pleuritic or exertional cp,  orthopnea pnd or leg swelling, presyncope, palpitations, abdominal pain, anorexia, nausea, vomiting, diarrhea  or change in bowel or bladder habits, change in stools or urine, dysuria,hematuria,  rash, arthralgias, visual complaints, headache, numbness, weakness or ataxia or problems with walking or coordination,  change in mood/affect or memory.                               Objective:   Physical Exam    amb obese bf nad   07/03/2016        276   06/04/2016       267   04/18/16 263 lb (119.3 kg)  03/26/16 264 lb 6.4 oz (119.9 kg)  03/20/16 263 lb (119.3 kg)   9/6/ 2017            295  Vital  signs reviewed   - Note on arrival 02 sats  97% on RA     HEENT: nl dentition, turbinates bilaterally, and oropharynx. Nl external ear canals without cough reflex   NECK :  without JVD/Nodes/TM/ nl carotid upstrokes bilaterally   LUNGS: no acc muscle use,  Nl contour chest which is clear to A and P bilaterally without cough on insp or exp maneuvers   CV:  RRR  no s3 or murmur or increase in P2, and no edema   ABD:  massivley obese but soft and nontender with nl inspiratory excursion in the supine position. No bruits or organomegaly appreciated, bowel sounds nl  MS:  Nl gait/ ext warm without deformities, calf tenderness, cyanosis or clubbing No obvious joint restrictions but some sts / calor tenderness L wrist and dorsum of L hand and pain on flex/ext of wrist   SKIN: warm and dry  With several slt hyperpigmented papules L face / several bx sites abd / back   NEURO:  alert, approp, nl sensorium with  no motor or cerebellar deficits apparent.       CXR PA and Lateral:   06/04/2016 :    I personally reviewed images and agree with radiology impression as follows:   Stable bilateral diffuse interstitial densities are noted most likely related to sarcoidosis. No significant change compared to prior exam.       Lab Results  Component Value Date   ESRSEDRATE 119 (H) 06/04/2016   ESRSEDRATE 90 (H) 04/18/2016    Labs ordered 06/04/2016   ACE level  260     Assessment:

## 2016-07-03 NOTE — Patient Instructions (Signed)
Come to outpatient registration at [facility] (behind the ER) at 99 am Thursday May 31 with nothing to eat or drink after midnight Wednesday for Bronchoscopy  Do not take your insulin Thursday morning but you can take your blood pressure medications  Increase clonidine to three times daily

## 2016-07-04 NOTE — Assessment & Plan Note (Signed)
  Body mass index is 40.76 kg/m.  -  trending up even before considering steroids longterm Lab Results  Component Value Date   TSH 3.60 03/08/2016     Contributing to gerd risk/ doe/reviewed the need and the process to achieve and maintain neg calorie balance > defer f/u primary care including intermittently monitoring thyroid status

## 2016-07-04 NOTE — H&P (Signed)
Patient ID: Amy Rosario, female   DOB: 16-Jun-1960     MRN: [id number]    Brief patient profile:  31 yobf with MO never smoker complicated   then abd swelling Feb 2018 > ? Sarcoid > referred to pulmonary clinic 04/18/2016 by Elray Mcgregor PA    History of Present Illness  04/18/2016 1st [facility] office visit/ Quita Mcgrory       Chief Complaint  Patient presents with  . Pulmonary Consult    Referred by Dr. Chana Bode for eval of Sarcoidosis. Pt states she was just dxed recently. She started having SOB in mid Feb 2018.  She gets winded walking short distances such as to her mailbox and back.  She also c/o cough- prod at times with minimal yellow to clear sputum. She states she has recently noticed bumps on her face.   onset of sob was indolent really since first of 2018 assoc with abd swelling and  mostly dry cough then noted "red bumps" on face and some aching of elbows but not ankles or wrists. No viz symptoms/ some anorexia but min wt loss  Doe = MMRC1 = can walk nl pace, flat grade, can't hurry or go uphills or steps s sob   rec Sarcoidosis is a benign inflammatory condition  Treatment is generally reserved for patients with major symptoms  schedule dermatology for skin biopsy Please remember to go to the lab  department downstairs in the basement  for your tests - we will call you with the results when they are available.    06/04/2016  f/u ov/Gloriana Piltz re:  Sarcoidosis      Chief Complaint  Patient presents with  . Follow-up    4 wk f/u for sarcoidosis.   new problem = stiffness and swelling in hands / had skin bx but doesn't know her doctor's name that treats this "it's gout" No change doe = mmrc 1  rec Prednisone 10 mg take  4 each am x 2 days,   2 each am x 2 days,  1 each am x 2 days and stop  Please remember to go to the lab  department downstairs in the basement  for your tests - we will call you with the results when they are available. Stop tenormin and start  bisoprolol  10 mg daily    07/03/2016  f/u ov/Antonios Ostrow re: sarcoidosis      Chief Complaint  Patient presents with  . Follow-up    Pt c/o stiffness in her neck in the am's.  She also c/o swelling in both hands.      much better on pred, much worse off in terms of all her arthritis symptoms esp L hand / wrist   Doe continues at level = MMRC1  No obvious day to day or daytime variability or assoc excess/ purulent sputum or mucus plugs or hemoptysis or cp or chest tightness, subjective wheeze or overt sinus or hb symptoms. No unusual exp hx or h/o childhood pna/ asthma or knowledge of premature birth.  Sleeping ok without nocturnal  or early am exacerbation  of respiratory  c/o's or need for noct saba. Also denies any obvious fluctuation of symptoms with weather or environmental changes or other aggravating or alleviating factors except as outlined above   Current Medications, Allergies, Complete Past Medical History, Past Surgical History, Family History, and Social History were reviewed in [employer] record.  ROS  The following are not active complaints unless bolded sore throat,  dysphagia, dental problems, itching, sneezing,  nasal congestion or excess/ purulent secretions, ear ache,   fever, chills, sweats, unintended wt loss, classically pleuritic or exertional cp,  orthopnea pnd or leg swelling, presyncope, palpitations, abdominal pain, anorexia, nausea, vomiting, diarrhea  or change in bowel or bladder habits, change in stools or urine, dysuria,hematuria,  rash, arthralgias, visual complaints, headache, numbness, weakness or ataxia or problems with walking or coordination,  change in mood/affect or memory.                               Objective:   Physical Exam    amb obese bf nad   07/03/2016        276      06/04/2016       267   04/18/16 263 lb (119.3 kg)  03/26/16 264 lb 6.4 oz (119.9 kg)  03/20/16 263 lb (119.3 kg)   9/6/ 2017             295  Vital signs reviewed   - Note on arrival 02 sats  97% on RA     HEENT: nl dentition, turbinates bilaterally, and oropharynx. Nl external ear canals without cough reflex   NECK :  without JVD/Nodes/TM/ nl carotid upstrokes bilaterally   LUNGS: no acc muscle use,  Nl contour chest which is clear to A and P bilaterally without cough on insp or exp maneuvers   CV:  RRR  no s3 or murmur or increase in P2, and no edema   ABD:  massivley obese but soft and nontender with nl inspiratory excursion in the supine position. No bruits or organomegaly appreciated, bowel sounds nl  MS:  Nl gait/ ext warm without deformities, calf tenderness, cyanosis or clubbing No obvious joint restrictions but some sts / calor tenderness L wrist and dorsum of L hand and pain on flex/ext of wrist   SKIN: warm and dry  With several slt hyperpigmented papules L face / several bx sites abd / back   NEURO:  alert, approp, nl sensorium with  no motor or cerebellar deficits apparent.       CXR PA and Lateral:   06/04/2016 :    I personally reviewed images and agree with radiology impression as follows:   Stable bilateral diffuse interstitial densities are noted most likely related to sarcoidosis. No significant change compared to prior exam.            Lab Results  Component Value Date   ESRSEDRATE 119 (H) 06/04/2016   ESRSEDRATE 90 (H) 04/18/2016    Labs ordered 06/04/2016   ACE level  260     Assessment:              Assessment & Plan Note by Tanda Rockers, MD at 07/04/2016 8:45 AM   Author: Tanda Rockers, MD Author Type: Physician Filed: 07/04/2016 8:46 AM  Note Status: Written Cosign: Cosign Not Required Encounter Date: 07/03/2016  Problem: Morbid obesity due to excess calories [facility])  Editor: Tanda Rockers, MD (Physician)      Body mass index is 40.76 kg/m.  -  trending up even before considering steroids longterm      Lab Results  Component  Value Date   TSH 3.60 03/08/2016     Contributing to gerd risk/ doe/reviewed the need and the process to achieve and maintain neg calorie balance > defer f/u primary care including intermittently monitoring thyroid status  Assessment & Plan Note by Tanda Rockers, MD at 07/04/2016 8:43 AM   Author: Tanda Rockers, MD Author Type: Physician Filed: 07/04/2016 8:44 AM  Note Status: Written Cosign: Cosign Not Required Encounter Date: 07/03/2016  Problem: Sarcoidosis  Editor: Tanda Rockers, MD (Physician)    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 - ESR 06/04/2016  119 >  Prednisone 10 mg take  4 each am x 2 days,   2 each am x 2 days,  1 each am x 2 days and stop  - PFT's  06/04/2016  FVC  2.46 (73%)  No obst and no improvement from saba p nothing  prior to study with DLCO  64/63 % corrects to 105  % for alv volume   - ACE level 06/04/2016 = 260  - FOB 07/05/16   She very clearly has sarcoid but really need a tissue dx to confirm before committing to more than short term steroids since MO is another major concern.  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   I had an extended discussion with the patient reviewing all relevant studies completed to date and  lasting 15 to 20 minutes of a 25 minute visit    Each maintenance medication was reviewed in detail including most importantly the difference between maintenance and prns and under what circumstances the prns are to be triggered using an  action plan format that is not reflected in the computer generated alphabetically organized AVS.    Please see AVS for specific instructions unique to this visit that I personally wrote and verbalized to the the pt in detail and then reviewed with pt  by my nurse highlighting any  changes in therapy recommended at today's visit to their plan of care.      Patient Instructions by Tanda Rockers, MD at 07/03/2016 1:45 PM   Author: Tanda Rockers, MD Author Type: Physician Filed: 07/03/2016 1:54 PM  Note Status: Signed Cosign: Cosign Not Required Encounter Date: 07/03/2016  Editor: Tanda Rockers, MD (Physician)    Come to outpatient registration at [facility] (behind the ER) at 36 am Thursday May 31 with nothing to eat or drink after midnight Wednesday for Bronchoscopy  Do not take your insulin Thursday morning but you can take your blood pressure medications  Increase clonidine to three times daily      07/05/2016  Day of FOB:  No change in hx or exam - bp up initially - took clonidine and lopressor at 6 am and baby asa last evening    Christinia Gully, MD Pulmonary and [place] [phone] After 5:30 PM or weekends, use [facility] [phone]

## 2016-07-04 NOTE — Assessment & Plan Note (Signed)
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 - ESR 06/04/2016  119 >  Prednisone 10 mg take  4 each am x 2 days,   2 each am x 2 days,  1 each am x 2 days and stop  - PFT's  06/04/2016  FVC  2.46 (73%)  No obst and no improvement from saba p nothing  prior to study with DLCO  64/63 % corrects to 105  % for alv volume   - ACE level 06/04/2016 = 260  - FOB 07/05/16   She very clearly has sarcoid but really need a tissue dx to confirm before committing to more than short term steroids since MO is another major concern.  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   I had an extended discussion with the patient reviewing all relevant studies completed to date and  lasting 15 to 20 minutes of a 25 minute visit    Each maintenance medication was reviewed in detail including most importantly the difference between maintenance and prns and under what circumstances the prns are to be triggered using an action plan format that is not reflected in the computer generated alphabetically organized AVS.    Please see AVS for specific instructions unique to this visit that I personally wrote and verbalized to the the pt in detail and then reviewed with pt  by my nurse highlighting any  changes in therapy recommended at today's visit to their plan of care.

## 2016-07-04 NOTE — Telephone Encounter (Signed)
Pt states having procedure done tomorrow by Dr. Melvyn Novas having light ran down into her stomach and she will call back soon and make appt

## 2016-07-05 ENCOUNTER — Encounter (HOSPITAL_COMMUNITY): Payer: Self-pay | Admitting: Respiratory Therapy

## 2016-07-05 ENCOUNTER — Ambulatory Visit (HOSPITAL_COMMUNITY)
Admission: RE | Admit: 2016-07-05 | Discharge: 2016-07-05 | Disposition: A | Discharge status: Home / Self Care | Payer: BC Managed Care – PPO | Source: Ambulatory Visit | Attending: Internal Medicine | Admitting: Internal Medicine

## 2016-07-05 ENCOUNTER — Ambulatory Visit (HOSPITAL_COMMUNITY): Payer: BC Managed Care – PPO

## 2016-07-05 ENCOUNTER — Encounter (HOSPITAL_COMMUNITY)
Admission: RE | Disposition: A | Discharge status: Home / Self Care | Payer: Self-pay | Source: Ambulatory Visit | Attending: Internal Medicine

## 2016-07-05 DIAGNOSIS — Z6841 Body Mass Index (BMI) 40.0 and over, adult: Secondary | ICD-10-CM | POA: Insufficient documentation

## 2016-07-05 DIAGNOSIS — Z7982 Long term (current) use of aspirin: Secondary | ICD-10-CM | POA: Diagnosis not present

## 2016-07-05 DIAGNOSIS — J8410 Pulmonary fibrosis, unspecified: Secondary | ICD-10-CM | POA: Diagnosis not present

## 2016-07-05 DIAGNOSIS — D869 Sarcoidosis, unspecified: Secondary | ICD-10-CM | POA: Diagnosis not present

## 2016-07-05 DIAGNOSIS — Z79899 Other long term (current) drug therapy: Secondary | ICD-10-CM | POA: Insufficient documentation

## 2016-07-05 DIAGNOSIS — Z794 Long term (current) use of insulin: Secondary | ICD-10-CM | POA: Insufficient documentation

## 2016-07-05 DIAGNOSIS — Z9889 Other specified postprocedural states: Secondary | ICD-10-CM

## 2016-07-05 HISTORY — PX: VIDEO BRONCHOSCOPY: SHX5072

## 2016-07-05 LAB — GLUCOSE, CAPILLARY: GLUCOSE-CAPILLARY: 209 mg/dL — AB (ref 65–99)

## 2016-07-05 SURGERY — BRONCHOSCOPY, WITH FLUOROSCOPY
Anesthesia: Moderate Sedation | Laterality: Bilateral

## 2016-07-05 MED ORDER — MIDAZOLAM HCL 10 MG/2ML IJ SOLN
INTRAMUSCULAR | Status: DC | PRN
  Administered 2016-07-05 (×2): 2.5 mg via INTRAVENOUS

## 2016-07-05 MED ORDER — PHENYLEPHRINE HCL 0.25 % NA SOLN: 1.0000 | Freq: Four times a day (QID) | NASAL | Status: DC | PRN

## 2016-07-05 MED ORDER — MIDAZOLAM HCL 5 MG/ML IJ SOLN: 1.0000 mg | Freq: Once | INTRAMUSCULAR | Status: DC

## 2016-07-05 MED ORDER — SODIUM CHLORIDE 0.9 % IV SOLN
INTRAVENOUS | Status: DC
  Administered 2016-07-05: 08:00:00 via INTRAVENOUS

## 2016-07-05 MED ORDER — LIDOCAINE HCL 2 % EX GEL
CUTANEOUS | Status: DC | PRN
  Administered 2016-07-05: 1

## 2016-07-05 MED ORDER — MIDAZOLAM HCL 5 MG/ML IJ SOLN
INTRAMUSCULAR | Status: AC
  Filled 2016-07-05: qty 2

## 2016-07-05 MED ORDER — LIDOCAINE HCL 1 % IJ SOLN
INTRAMUSCULAR | Status: DC | PRN
  Administered 2016-07-05: 6 mL via RESPIRATORY_TRACT

## 2016-07-05 MED ORDER — PHENYLEPHRINE HCL 0.25 % NA SOLN
NASAL | Status: DC | PRN
  Administered 2016-07-05: 2 via NASAL

## 2016-07-05 MED ORDER — MEPERIDINE HCL 100 MG/ML IJ SOLN: 100.0000 mg | Freq: Once | INTRAMUSCULAR | Status: DC

## 2016-07-05 MED ORDER — MEPERIDINE HCL 100 MG/ML IJ SOLN
INTRAMUSCULAR | Status: AC
  Filled 2016-07-05: qty 2

## 2016-07-05 MED ORDER — MEPERIDINE HCL 25 MG/ML IJ SOLN
INTRAMUSCULAR | Status: DC | PRN
  Administered 2016-07-05: 50 mg via INTRAVENOUS

## 2016-07-05 MED ORDER — LIDOCAINE HCL 2 % EX GEL: 1.0000 "application " | Freq: Once | CUTANEOUS | Status: DC

## 2016-07-05 NOTE — Progress Notes (Signed)
Video Bronchoscopy note  Intervention Bronchial biopsy x2

## 2016-07-05 NOTE — Op Note (Signed)
Bronchoscopy Procedure Note  Date of Operation: 07/05/2016   Pre-op Diagnosis: sarcoid  Post-op Diagnosis: same  Surgeon: Christinia Gully  Anesthesia: Monitored Local Anesthesia with Sedation Time Started: 0820  Versed 5 mg IV total, demerol 50 mg IV needed for adequate sedation and cough suppression Time Stopped:  0840  Operation: Video Flexible fiberoptic bronchoscopy, diagnostic   Findings: TBBX RLL/ endobronchial bx rml orifice   Specimen: TBBX RLL/ endobronchial bx rml orifice   Estimated Blood Loss: min  Complications: none/ fu;/  Indications and History: See updated H and P same date. The risks, benefits, complications, treatment options and expected outcomes were discussed with the patient.  The possibilities of reaction to medication, pulmonary aspiration, perforation of a viscus, bleeding, failure to diagnose a condition and creating a complication requiring transfusion or operation were discussed with the patient who freely signed the consent.    Description of Procedure: The patient was re-examined in the bronchoscopy suite and the site of surgery properly noted/marked.  The patient was identified  and the procedure verified as Flexible Fiberoptic Bronchoscopy.  A Time Out was held and the above information confirmed.   After the induction of topical nasopharyngeal anesthesia, the patient was positioned  and the bronchoscope was passed through the R naris. The vocal cords were visualized and  1% buffered lidocaine 5 ml was topically placed onto the cords. The cords were nl. The scope was then passed into the trachea.  1% buffered lidocaine given topically. Airways inspected bilaterally to the subsegmental level with the following findings:    Severe diffuse cobblestoning of all the airways bilaterally with mild/mod diffuse airway narrowing c/w sarcoid    Interventions:  TBBX RLL x 4 / endobronchial bx rml orifice x 3      The Patient was taken to the Endoscopy  Recovery area in satisfactory condition.  Attestation: I performed the procedure.  Christinia Gully, MD Pulmonary and [place] [phone] After 5:30 PM or weekends, call [phone]

## 2016-07-05 NOTE — Progress Notes (Signed)
Patient encouraged to take deep breaths and to cough.

## 2016-07-05 NOTE — Discharge Instructions (Signed)
Flexible Bronchoscopy, Care After These instructions give you information on caring for yourself after your procedure. Your doctor may also give you more specific instructions. Call your doctor if you have any problems or questions after your procedure. Follow these instructions at home:  Do not eat or drink anything for 2 hours after your procedure. If you try to eat or drink before the medicine wears off, food or drink could go into your lungs. You could also burn yourself.  After 2 hours have passed and when you can cough and gag normally, you may eat soft food and drink liquids slowly.  The day after the test, you may eat your normal diet.  You may do your normal activities.  Keep all doctor visits. Get help right away if:  You get more and more short of breath.  You get light-headed.  You feel like you are going to pass out (faint).  You have chest pain.  You have new problems that worry you.  You cough up more than a little blood.  You cough up more blood than before. This information is not intended to replace advice given to you by your health care provider. Make sure you discuss any questions you have with your health care provider. Document Released: 11/19/2008 Document Revised: 06/30/2015 Document Reviewed: 09/26/2012 Elsevier Interactive Patient Education  2017 [place].  Nothing to eat or drink until    10:20     am today   07/05/2016

## 2016-07-06 ENCOUNTER — Encounter (HOSPITAL_COMMUNITY): Payer: Self-pay | Admitting: Internal Medicine

## 2016-07-10 NOTE — Progress Notes (Signed)
LMTCB

## 2016-07-13 NOTE — Progress Notes (Signed)
LMTCB

## 2016-07-16 ENCOUNTER — Other Ambulatory Visit: Payer: Self-pay | Admitting: Internal Medicine

## 2016-07-16 MED ORDER — PREDNISONE 10 MG PO TABS: 10.0000 mg | ORAL_TABLET | Freq: Every day | ORAL | 1 refills | Status: DC

## 2016-07-16 NOTE — Progress Notes (Signed)
Spoke with pt and notified of results per Dr. Wert. Pt verbalized understanding and denied any questions. 

## 2016-08-12 ENCOUNTER — Other Ambulatory Visit: Payer: Self-pay | Admitting: Medical

## 2016-08-17 ENCOUNTER — Ambulatory Visit (INDEPENDENT_AMBULATORY_CARE_PROVIDER_SITE_OTHER): Payer: BC Managed Care – PPO | Admitting: Internal Medicine

## 2016-08-17 ENCOUNTER — Other Ambulatory Visit: Payer: Self-pay | Admitting: Medical

## 2016-08-17 ENCOUNTER — Encounter: Payer: Self-pay | Admitting: Internal Medicine

## 2016-08-17 ENCOUNTER — Telehealth: Payer: Self-pay | Admitting: Medical

## 2016-08-17 VITALS — BP 166/100 | HR 67 | Ht 69.0 in | Wt 283.0 lb

## 2016-08-17 DIAGNOSIS — D869 Sarcoidosis, unspecified: Secondary | ICD-10-CM | POA: Diagnosis not present

## 2016-08-17 DIAGNOSIS — I10 Essential (primary) hypertension: Secondary | ICD-10-CM

## 2016-08-17 MED ORDER — GLUCOSE BLOOD VI STRP: ORAL_STRIP | 12 refills | Status: DC

## 2016-08-17 NOTE — Patient Instructions (Addendum)
Take zebeta (10 mg) each am and continue clonidine 0.2 three times day   Prednisone ceiling of 20 mg per day and floor of 5 mg with breakfast   Keep appt with Tysinger  Please schedule a follow up office visit in 6 weeks, call sooner if needed with cxr on return - also needs cmet Amy Rosario

## 2016-08-17 NOTE — Telephone Encounter (Signed)
rx renewed. /RLB 

## 2016-08-17 NOTE — Telephone Encounter (Signed)
Pt scheduled a diabetes check/BP follow up on 08/29/16. She is requesting a refill on Gabapentin 100 mg, Allopurinol 100 mg, test strips for Contour Next - tests 2 times a day

## 2016-08-17 NOTE — Progress Notes (Signed)
Subjective:     Patient ID: Amy Rosario, female   DOB: 04/12/1960     MRN: [id number]    Brief patient profile:  13 yobf with MO never smoker  Presented sob/abn lfts Feb 2018 > ? dx Sarcoid > referred to pulmonary clinic 04/18/2016 by Elray Mcgregor PA  - FOB 07/05/16> severe diffuse cobblestoning :  Endobronchial bx:  NCG     History of Present Illness  04/18/2016 1st [facility] office visit/ Darryle Dennie   Chief Complaint  Patient presents with  . Pulmonary Consult    Referred by Dr. Chana Bode for eval of Sarcoidosis. Pt states she was just dxed recently. She started having SOB in mid Feb 2018.  She gets winded walking short distances such as to her mailbox and back.  She also c/o cough- prod at times with minimal yellow to clear sputum. She states she has recently noticed bumps on her face.   onset of sob was indolent really since first of 2018 assoc with abd swelling and  mostly dry cough then noted "red bumps" on face and some aching of elbows but not ankles or wrists. No viz symptoms/ some anorexia but min wt loss  Doe = MMRC1 = can walk nl pace, flat grade, can't hurry or go uphills or steps s sob   rec Sarcoidosis is a benign inflammatory condition  Treatment is generally reserved for patients with major symptoms  schedule dermatology for skin biopsy Please remember to go to the lab  department downstairs in the basement  for your tests - we will call you with the results when they are available.    06/04/2016  f/u ov/Yaslin Kirtley re:  Sarcoidosis  Chief Complaint  Patient presents with  . Follow-up    4 wk f/u for sarcoidosis.   new problem = stiffness and swelling in hands / had skin bx but doesn't know her doctor's name that treats this "it's gout" No change doe = mmrc 1  rec Prednisone 10 mg take  4 each am x 2 days,   2 each am x 2 days,  1 each am x 2 days and stop  Please remember to go to the lab  department downstairs in the basement  for your tests - we will call you  with the results when they are available. Stop tenormin and start bisoprolol  10 mg daily    07/03/2016  f/u ov/Alezander Dimaano re: sarcoidosis  Chief Complaint  Patient presents with  . Follow-up    Pt c/o stiffness in her neck in the am's.  She also c/o swelling in both hands.   much better on pred, much worse off in terms of all her arthritis symptoms esp L hand / wrist  Doe continues at level = MMRC1 rec - FOB 07/05/16> severe diffuse cobblestoning :  Endobronchial bx:  NCG rec pred 20 mg daily until better then 10 mg daily and f/u in one month   08/17/2016  f/u ov/Melissaann Dizdarevic re:  Sarcoid with prominent airway involvement on pred 10 mg x one week s flare  Chief Complaint  Patient presents with  . Follow-up    Arthritic pain improved on pred. Breathing is doing well and no new co's today.    main symptom arthritis elbows/ hands resolved/  Not limited by breathing from desired activities  But not very active  No obvious day to day or daytime variability or assoc excess/ purulent sputum or mucus plugs or hemoptysis or cp or chest tightness,  subjective wheeze or overt sinus or hb symptoms. No unusual exp hx or h/o childhood pna/ asthma or knowledge of premature birth.  Sleeping ok without nocturnal  or early am exacerbation  of respiratory  c/o's or need for noct saba. Also denies any obvious fluctuation of symptoms with weather or environmental changes or other aggravating or alleviating factors except as outlined above   Current Medications, Allergies, Complete Past Medical History, Past Surgical History, Family History, and Social History were reviewed in [employer] record.  ROS  The following are not active complaints unless bolded sore throat, dysphagia, dental problems, itching, sneezing,  nasal congestion or excess/ purulent secretions, ear ache,   fever, chills, sweats, unintended wt loss, classically pleuritic or exertional cp,  orthopnea pnd or leg swelling, presyncope,  palpitations, abdominal pain, anorexia, nausea, vomiting, diarrhea  or change in bowel or bladder habits, change in stools or urine, dysuria,hematuria,  rash, arthralgias, visual complaints, headache, numbness, weakness or ataxia or problems with walking or coordination,  change in mood/affect or memory.                     Objective:   Physical Exam    amb obese bf nad    08/17/2016        284  07/03/2016        276   06/04/2016       267   04/18/16 263 lb (119.3 kg)  03/26/16 264 lb 6.4 oz (119.9 kg)  03/20/16 263 lb (119.3 kg)   9/6/ 2017            295  Vital signs reviewed   - Note on arrival 02 sats  98% on RA     HEENT: nl dentition, turbinates bilaterally, and oropharynx. Nl external ear canals without cough reflex   NECK :  without JVD/Nodes/TM/ nl carotid upstrokes bilaterally   LUNGS: no acc muscle use,  Nl contour chest which is clear to A and P bilaterally without cough on insp or exp maneuvers   CV:  RRR  no s3 or murmur or increase in P2, and no edema   ABD:  massivley obese but soft and nontender with nl inspiratory excursion in the supine position. No bruits or organomegaly appreciated, bowel sounds nl  MS:  Nl gait/ ext warm without deformities, calf tenderness, cyanosis or clubbing No obvious joint restrictions but some sts / calor tenderness L wrist and dorsum of L hand and pain on flex/ext of wrist   SKIN: warm and dry  With several slt hyperpigmented papules L face / several bx sites abd / back   NEURO:  alert, approp, nl sensorium with  no motor or cerebellar deficits apparent.       CXR PA and Lateral:   06/04/2016 :    I personally reviewed images and agree with radiology impression as follows:   Stable bilateral diffuse interstitial densities are noted most likely related to sarcoidosis. No significant change compared to prior exam.       Lab Results  Component Value Date   ESRSEDRATE 119 (H) 06/04/2016   ESRSEDRATE 90 (H) 04/18/2016     Labs ordered 06/04/2016   ACE level  260     Assessment:

## 2016-08-17 NOTE — Telephone Encounter (Signed)
Refill please and c/t plan for appt as listed

## 2016-08-18 NOTE — Assessment & Plan Note (Signed)
Body mass index is 41.79 kg/m.  -  trending toward severe  Lab Results  Component Value Date   TSH 3.60 03/08/2016     Contributing to gerd risk/ doe/reviewed the need and the process to achieve and maintain neg calorie balance > defer f/u primary care including intermittently monitoring thyroid status

## 2016-08-18 NOTE — Assessment & Plan Note (Addendum)
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 - ESR 06/04/2016  119 >  Prednisone 10 mg take  4 each am x 2 days,   2 each am x 2 days,  1 each am x 2 days and stop  - PFT's  06/04/2016  FVC  2.46 (73%)  No obst and no improvement from saba p nothing  prior to study with DLCO  64/63 % corrects to 105  % for alv volume   - ACE level 06/04/2016 = 260  - FOB 07/05/16> severe diffuse cobblestoning :  Endobronchial bx:  NCG rec pred 20 mg daily until better then 10 mg daily   - 08/17/2016 improved arthritis so taper to 5 mg daily as floor   The goal with a chronic steroid dependent illness is always arriving at the lowest effective dose that controls the disease/symptoms and not accepting a set "formula" which is based on statistics or guidelines that don't always take into account patient  variability or the natural hx of the dz in every individual patient, which may well vary over time.  For now therefore I recommend the patient maintain  A ceiling of 20 but a floor for now of 5 mg and err on the low side due to wt gain   Will need f/u labs on return but would not "chase numbers" here as this will likely lead to excessive steroid rx over time with higher morbidity from the steroids than from the dz.   I had an extended discussion with the patient reviewing all relevant studies completed to date and  lasting 15 to 20 minutes of a 25 minute visit    Each maintenance medication was reviewed in detail including most importantly the difference between maintenance and prns and under what circumstances the prns are to be triggered using an action plan format that is not reflected in the computer generated alphabetically organized AVS.    Please see AVS for specific instructions unique to this visit that I personally wrote and verbalized to the the pt in detail and then reviewed with pt  by my nurse highlighting  any  changes in therapy recommended at today's visit to their plan of care.

## 2016-08-18 NOTE — Assessment & Plan Note (Signed)
Changed tenormin to bisoprolol 06/04/2016 due to concern with asthmatic component   Not optimally controlled on present regimen. I reviewed this with the patient and emphasized importance of taking meds consistently, avoiding salt and  follow-up with primary care.  Keeping pred dose as low as possible and losing wt would help too

## 2016-08-29 ENCOUNTER — Telehealth: Payer: Self-pay

## 2016-08-29 ENCOUNTER — Encounter: Payer: Self-pay | Admitting: Medical

## 2016-08-29 ENCOUNTER — Ambulatory Visit (INDEPENDENT_AMBULATORY_CARE_PROVIDER_SITE_OTHER): Payer: BC Managed Care – PPO | Admitting: Medical

## 2016-08-29 VITALS — BP 170/100 | HR 72 | Wt 287.6 lb

## 2016-08-29 DIAGNOSIS — Z794 Long term (current) use of insulin: Secondary | ICD-10-CM | POA: Diagnosis not present

## 2016-08-29 DIAGNOSIS — R7989 Other specified abnormal findings of blood chemistry: Secondary | ICD-10-CM

## 2016-08-29 DIAGNOSIS — Z9989 Dependence on other enabling machines and devices: Secondary | ICD-10-CM

## 2016-08-29 DIAGNOSIS — G4733 Obstructive sleep apnea (adult) (pediatric): Secondary | ICD-10-CM

## 2016-08-29 DIAGNOSIS — D869 Sarcoidosis, unspecified: Secondary | ICD-10-CM | POA: Diagnosis not present

## 2016-08-29 DIAGNOSIS — E785 Hyperlipidemia, unspecified: Secondary | ICD-10-CM | POA: Diagnosis not present

## 2016-08-29 DIAGNOSIS — M1A9XX0 Chronic gout, unspecified, without tophus (tophi): Secondary | ICD-10-CM | POA: Diagnosis not present

## 2016-08-29 DIAGNOSIS — K760 Fatty (change of) liver, not elsewhere classified: Secondary | ICD-10-CM

## 2016-08-29 DIAGNOSIS — M109 Gout, unspecified: Secondary | ICD-10-CM | POA: Insufficient documentation

## 2016-08-29 DIAGNOSIS — N289 Disorder of kidney and ureter, unspecified: Secondary | ICD-10-CM | POA: Diagnosis not present

## 2016-08-29 DIAGNOSIS — E118 Type 2 diabetes mellitus with unspecified complications: Secondary | ICD-10-CM

## 2016-08-29 DIAGNOSIS — R945 Abnormal results of liver function studies: Secondary | ICD-10-CM

## 2016-08-29 DIAGNOSIS — I10 Essential (primary) hypertension: Secondary | ICD-10-CM

## 2016-08-29 LAB — LIPID PANEL
CHOLESTEROL: 194 mg/dL (ref <200)
HDL: 73 mg/dL (ref >50)
LDL CALC: 99 mg/dL (ref <100)
TRIGLYCERIDES: 109 mg/dL (ref <150)
Total CHOL/HDL Ratio: 2.7 Ratio (ref <5.0)
VLDL: 22 mg/dL (ref <30)

## 2016-08-29 LAB — HEPATIC FUNCTION PANEL
ALBUMIN: 4 g/dL (ref 3.6–5.1)
ALK PHOS: 118 U/L (ref 33–130)
ALT: 25 U/L (ref 6–29)
AST: 18 U/L (ref 10–35)
Bilirubin, Direct: 0.1 mg/dL (ref <=0.2)
Indirect Bilirubin: 0.4 mg/dL (ref 0.2–1.2)
Total Bilirubin: 0.5 mg/dL (ref 0.2–1.2)
Total Protein: 7.5 g/dL (ref 6.1–8.1)

## 2016-08-29 LAB — RENAL FUNCTION PANEL
Albumin: 4 g/dL (ref 3.6–5.1)
BUN: 17 mg/dL (ref 7–25)
CHLORIDE: 100 mmol/L (ref 98–110)
CO2: 24 mmol/L (ref 20–31)
CREATININE: 0.88 mg/dL (ref 0.50–1.05)
Calcium: 9.6 mg/dL (ref 8.6–10.4)
Glucose, Bld: 316 mg/dL — ABNORMAL HIGH (ref 65–99)
POTASSIUM: 3.5 mmol/L (ref 3.5–5.3)
Phosphorus: 2.8 mg/dL (ref 2.5–4.5)
Sodium: 135 mmol/L (ref 135–146)

## 2016-08-29 LAB — GLUCOSE, POCT (MANUAL RESULT ENTRY): POC Glucose: 333 mg/dl — AB (ref 70–99)

## 2016-08-29 NOTE — Patient Instructions (Signed)
Call Dr. Gustavus Bryant office and have them verify exactly how you are suppose to be taking the Prednisone.  Your chart shows 10mg  tablets daily but your instructions are different.  Ask pharmacy about safety of the Exforge since the medication contains Valsartan which has recently been in the news regarding a contaminate from the manufacturer  If the pharmacist is ok with the Exforge, then add it back.  If not let me know so I can call out something different.  Continue Bisoprolol and Clonidine though.  Consider Clonidine weekly patch.  We will likely change your diabetes medications pending labs since your sugars are in the 300s.  This is due mostly to prednisone for your sarcoidosis issues.  Try and exercise daily with walking or chair bike or other exercise  Eat a healthy low fat diet.

## 2016-08-29 NOTE — Progress Notes (Signed)
Subjective: Chief Complaint  Patient presents with  . Diabetes    diabetes and b/p  having high b/p and b/s reading since she started a new medicine.   Here for f/u on chronic issues.  Last visit 03/2016.  At that time we ended up referring to pulmonology and she was diagnosed with sarcoidosis, been doing treatment for this.  Feels ok for the moment. Is on prednisone currently.  Has hx/o HTN, diabetes, hyperlipidemia, obesity, OSA, gout, fatty liver disease, takes Neurontin for pain.  OSA - using CPAP  HTN - she notes when she was taking the combo amlodipine-valsartan-HTC 10/320/25 BPs were good.   She notes she was changed to Bisoprolol 10mg  daily and BPs have been elevated.  The last time she was seeing good BP readings was April.  She continues on clonidine 0.2mg  TID.   Taking potassium daily  compliant with Crestor and aspirin QHS  Takes gabapentin once daily QHS  diabetes - glucose readings have been high for about 2.5 months about the time BPs were going high.   Felt like this started when BP medication was changed by pulmonology.  Using Novolog 70/30 mix 22 u BID.    Checking feet daily, no foot concerns.  No recent eye doctor visits   Past Medical History:  Diagnosis Date  . Back pain   . Diabetes mellitus without complication ([place])   . Goiter    left side, saw dr Eilee Schader april 2015 for, had biopsy done was told it is cyst  . H/O echocardiogram    Dr. Charolette Forward, [facility]  . Hyperlipidemia   . Hypertension 2000  . Impaired fasting blood sugar 2014  . Obesity   . Other screening mammogram    planned for 05/2013  . Routine gynecological examination    last pap 2012  . Sleep apnea    on CPAP setting of 2   Current Outpatient Prescriptions on File Prior to Visit  Medication Sig Dispense Refill  . allopurinol (ZYLOPRIM) 100 MG tablet Take 1 tablet (100 mg total) by mouth daily. 30 tablet 2  . aspirin 81 MG tablet Take 1 tablet (81 mg total)  by mouth daily. 90 tablet 3  . bisoprolol (ZEBETA) 10 MG tablet Take 1 tablet (10 mg total) by mouth daily. 30 tablet 11  . cloNIDine (CATAPRES) 0.2 MG tablet Take 1 tablet (0.2 mg total) by mouth 2 (two) times daily. 180 tablet 3  . gabapentin (NEURONTIN) 100 MG capsule Take 1 capsule (100 mg total) by mouth at bedtime. 30 capsule 2  . glucose blood test strip Use twice a day. E29.1  Contour Next Test Strips. 100 each 12  . Insulin Pen Needle 32G X 4 MM MISC 32 g by Does not apply route 2 (two) times daily. 90 each 5  . KLOR-CON 10 10 MEQ tablet TAKE 1 TABLET (10 MEQ TOTAL) BY MOUTH DAILY. 90 tablet 1  . meclizine (ANTIVERT) 25 MG tablet Take 1 tablet (25 mg total) by mouth 2 (two) times daily. 30 tablet 0  . NOVOLOG MIX 70/30 FLEXPEN (70-30) 100 UNIT/ML FlexPen INJECT 0.15 MLS (15 UNITS TOTAL) INTO THE SKIN 2 (TWO) TIMES DAILY. 15 pen 2  . predniSONE (DELTASONE) 10 MG tablet Take 1 tablet (10 mg total) by mouth daily with breakfast. (Patient taking differently: Take by mouth daily with breakfast. ) 60 tablet 1  . rosuvastatin (CRESTOR) 20 MG tablet Take 1 tablet (20 mg total) by mouth daily. 90 tablet 3  No current facility-administered medications on file prior to visit.    ROS as in subjective   Objective: BP (!) 170/100   Pulse 72   Wt 287 lb 9.6 oz (130.5 kg)   LMP 11/09/2013   SpO2 98%   BMI 42.47 kg/m   BP Readings from Last 3 Encounters:  08/29/16 (!) 170/100  08/17/16 (!) 166/100  07/05/16 (!) 160/80   Wt Readings from Last 3 Encounters:  08/29/16 287 lb 9.6 oz (130.5 kg)  08/17/16 283 lb (128.4 kg)  07/03/16 276 lb (125.2 kg)   General appearance: alert, no distress, WD/WN,  Neck: supple, no lymphadenopathy, +moderate goiter, no distinct nodules, no masses Heart: RRR, normal S1, S2, no murmurs Lungs: CTA bilaterally, no wheezes, rhonchi, or rales Abdomen: +bs, soft, non tender, non distended, no masses, no hepatomegaly, no splenomegaly Pulses: 1+ symmetric, upper  extremities, normal cap refill Ext: no edema   Diabetic Foot Exam - Simple   Simple Foot Form Diabetic Foot exam was performed with the following findings:  Yes 08/29/2016 10:41 AM  Visual Inspection No deformities, no ulcerations, no other skin breakdown bilaterally:  Yes Sensation Testing Intact to touch and monofilament testing bilaterally:  Yes Pulse Check See comments:  Yes Comments 1+ pedal pulses       Assessment: Encounter Diagnoses  Name Primary?  . Type 2 diabetes mellitus with complication, with long-term current use of insulin ([place]) Yes  . Essential hypertension, benign   . OSA on CPAP   . Fatty liver   . Renal insufficiency   . Elevated LFTs   . Hyperlipidemia, unspecified hyperlipidemia type   . Morbid obesity due to excess calories ([place])   . Sarcoidosis   . Chronic gout without tophus, unspecified cause, unspecified site      Plan: discussed her concerns, reviewed medications, labs orders today.   advised yearly eye doctor f/u, daily foot checks.     HTN - add back the 06/2016 bottle of Exforge she has as this was controlling BPs.   C/t Bisoprolol and clonidine.  After reviewing pulmonology records, Atenolol was d/c by Dr. Melvyn Novas in 04/2016 and Bisoprolol added, but I don't see where Exforge was d/c, so there may have been some confusion over there BP medications.  Diabetes uncontrolled given prednisone use which she is taking for sarcoidosis.   Currently her diabetes treatment is Novolog 70/30 BID, but we may need to add something to this.  F/u pending labs  Patient Instructions  Call Dr. Gustavus Bryant office and have them verify exactly how you are suppose to be taking the Prednisone.  Your chart shows 10mg  tablets daily but your instructions are different.  Ask pharmacy about safety of the Exforge since the medication contains Valsartan which has recently been in the news regarding a contaminate from the manufacturer  If the pharmacist is ok with the Exforge,  then add it back.  If not let me know so I can call out something different.  Continue Bisoprolol and Clonidine though.  Consider Clonidine weekly patch.  We will likely change your diabetes medications pending labs since your sugars are in the 300s.  This is due mostly to prednisone for your sarcoidosis issues.  Try and exercise daily with walking or chair bike or other exercise  Eat a healthy low fat diet.         Nathaniel was seen today for diabetes.  Diagnoses and all orders for this visit:  Type 2 diabetes mellitus with complication, with long-term  current use of insulin ([facility]) -     Glucose (CBG) -     Renal Function Panel -     Hepatic function panel -     HM DIABETES EYE EXAM -     HM DIABETES FOOT EXAM -     Hemoglobin A1c -     Microalbumin / creatinine urine ratio -     VITAMIN D 25 Hydroxy (Vit-D Deficiency, Fractures)  Essential hypertension, benign -     Renal Function Panel -     VITAMIN D 25 Hydroxy (Vit-D Deficiency, Fractures)  OSA on CPAP  Fatty liver -     Hepatic function panel  Renal insufficiency -     Renal Function Panel  Elevated LFTs -     VITAMIN D 25 Hydroxy (Vit-D Deficiency, Fractures) -     Hepatitis C antibody -     Hepatitis B surface antigen  Hyperlipidemia, unspecified hyperlipidemia type -     Lipid panel  Morbid obesity due to excess calories ([facility])  Sarcoidosis  Chronic gout without tophus, unspecified cause, unspecified site -     Uric acid

## 2016-08-29 NOTE — Telephone Encounter (Signed)
FYI-  Pt called Amy Rosario back to let you know that her meds was not on the recall list ans safe for her to take

## 2016-08-30 ENCOUNTER — Other Ambulatory Visit: Payer: Self-pay | Admitting: Medical

## 2016-08-30 DIAGNOSIS — M792 Neuralgia and neuritis, unspecified: Secondary | ICD-10-CM

## 2016-08-30 DIAGNOSIS — M79671 Pain in right foot: Secondary | ICD-10-CM

## 2016-08-30 DIAGNOSIS — M79672 Pain in left foot: Secondary | ICD-10-CM

## 2016-08-30 DIAGNOSIS — E119 Type 2 diabetes mellitus without complications: Secondary | ICD-10-CM

## 2016-08-30 LAB — MICROALBUMIN / CREATININE URINE RATIO
Creatinine, Urine: 80 mg/dL (ref 20–320)
MICROALB UR: 3 mg/dL
Microalb Creat Ratio: 38 mcg/mg creat — ABNORMAL HIGH (ref <30)

## 2016-08-30 LAB — VITAMIN D 25 HYDROXY (VIT D DEFICIENCY, FRACTURES): Vit D, 25-Hydroxy: 27 ng/mL — ABNORMAL LOW (ref 30–100)

## 2016-08-30 LAB — HEPATITIS C ANTIBODY: HCV Ab: NEGATIVE

## 2016-08-30 LAB — HEMOGLOBIN A1C
Hgb A1c MFr Bld: 12.9 % — ABNORMAL HIGH (ref <5.7)
Mean Plasma Glucose: 324 mg/dL

## 2016-08-30 LAB — HEPATITIS B SURFACE ANTIGEN: HEP B S AG: NEGATIVE

## 2016-08-30 LAB — URIC ACID: URIC ACID, SERUM: 3.7 mg/dL (ref 2.5–7.0)

## 2016-08-30 MED ORDER — ASPIRIN 81 MG PO TABS: 81.0000 mg | ORAL_TABLET | Freq: Every day | ORAL | 3 refills | Status: DC

## 2016-08-30 MED ORDER — AMLODIPINE-VALSARTAN-HCTZ 10-320-25 MG PO TABS: 1.0000 | ORAL_TABLET | Freq: Every day | ORAL | 1 refills | Status: DC

## 2016-08-30 MED ORDER — VITAMIN D (ERGOCALCIFEROL) 1.25 MG (50000 UNIT) PO CAPS: 50000.0000 [IU] | ORAL_CAPSULE | ORAL | 0 refills | Status: DC

## 2016-08-30 MED ORDER — POTASSIUM CHLORIDE ER 10 MEQ PO TBCR: 10.0000 meq | EXTENDED_RELEASE_TABLET | Freq: Every day | ORAL | 3 refills | Status: DC

## 2016-08-30 MED ORDER — INSULIN ASPART PROT & ASPART (70-30 MIX) 100 UNIT/ML PEN: 25.0000 [IU] | PEN_INJECTOR | Freq: Two times a day (BID) | SUBCUTANEOUS | 2 refills | Status: DC

## 2016-08-30 MED ORDER — CLONIDINE HCL 0.2 MG PO TABS: 0.2000 mg | ORAL_TABLET | Freq: Three times a day (TID) | ORAL | 5 refills | Status: DC

## 2016-08-30 MED ORDER — GABAPENTIN 100 MG PO CAPS: 100.0000 mg | ORAL_CAPSULE | Freq: Every day | ORAL | 3 refills | Status: DC

## 2016-09-03 ENCOUNTER — Other Ambulatory Visit: Payer: Self-pay | Admitting: Medical

## 2016-09-03 ENCOUNTER — Other Ambulatory Visit: Payer: Self-pay

## 2016-09-03 DIAGNOSIS — Z794 Long term (current) use of insulin: Secondary | ICD-10-CM

## 2016-09-03 DIAGNOSIS — E118 Type 2 diabetes mellitus with unspecified complications: Secondary | ICD-10-CM

## 2016-09-03 MED ORDER — ROSUVASTATIN CALCIUM 40 MG PO TABS: 40.0000 mg | ORAL_TABLET | Freq: Every day | ORAL | 0 refills | Status: DC

## 2016-09-25 ENCOUNTER — Other Ambulatory Visit: Payer: Self-pay | Admitting: Medical

## 2016-09-25 NOTE — Telephone Encounter (Signed)
Can she have refill on this??

## 2016-09-25 NOTE — Telephone Encounter (Signed)
Called in.

## 2016-09-28 ENCOUNTER — Ambulatory Visit (INDEPENDENT_AMBULATORY_CARE_PROVIDER_SITE_OTHER)
Admission: RE | Admit: 2016-09-28 | Discharge: 2016-09-28 | Disposition: A | Discharge status: Home / Self Care | Payer: BC Managed Care – PPO | Source: Ambulatory Visit | Attending: Internal Medicine | Admitting: Internal Medicine

## 2016-09-28 ENCOUNTER — Ambulatory Visit (INDEPENDENT_AMBULATORY_CARE_PROVIDER_SITE_OTHER): Payer: BC Managed Care – PPO | Admitting: Internal Medicine

## 2016-09-28 ENCOUNTER — Encounter: Payer: Self-pay | Admitting: Internal Medicine

## 2016-09-28 ENCOUNTER — Other Ambulatory Visit (INDEPENDENT_AMBULATORY_CARE_PROVIDER_SITE_OTHER): Payer: BC Managed Care – PPO

## 2016-09-28 VITALS — BP 128/84 | HR 78 | Ht 69.0 in | Wt 289.0 lb

## 2016-09-28 DIAGNOSIS — R87619 Unspecified abnormal cytological findings in specimens from cervix uteri: Secondary | ICD-10-CM

## 2016-09-28 DIAGNOSIS — R945 Abnormal results of liver function studies: Secondary | ICD-10-CM

## 2016-09-28 DIAGNOSIS — D869 Sarcoidosis, unspecified: Secondary | ICD-10-CM

## 2016-09-28 DIAGNOSIS — N289 Disorder of kidney and ureter, unspecified: Secondary | ICD-10-CM

## 2016-09-28 DIAGNOSIS — R7989 Other specified abnormal findings of blood chemistry: Secondary | ICD-10-CM

## 2016-09-28 DIAGNOSIS — R748 Abnormal levels of other serum enzymes: Secondary | ICD-10-CM

## 2016-09-28 DIAGNOSIS — R634 Abnormal weight loss: Secondary | ICD-10-CM

## 2016-09-28 DIAGNOSIS — K760 Fatty (change of) liver, not elsewhere classified: Secondary | ICD-10-CM

## 2016-09-28 DIAGNOSIS — R935 Abnormal findings on diagnostic imaging of other abdominal regions, including retroperitoneum: Secondary | ICD-10-CM

## 2016-09-28 LAB — SEDIMENTATION RATE: SED RATE: 35 mm/h — AB (ref 0–30)

## 2016-09-28 NOTE — Progress Notes (Signed)
Subjective:     Patient ID: Amy Rosario, female   DOB: 1960-09-20     MRN: [id number]    Brief patient profile:  31 yobf with MO never smoker  Presented sob/abn lfts Feb 2018 > ? dx Sarcoid > referred to pulmonary clinic 04/18/2016 by Elray Mcgregor PA  - FOB 07/05/16> severe diffuse cobblestoning :  Endobronchial bx:  NCG     History of Present Illness  04/18/2016 1st [facility] office visit/ Amy Rosario   Chief Complaint  Patient presents with  . Pulmonary Consult    Referred by Dr. Chana Bode for eval of Sarcoidosis. Pt states she was just dxed recently. She started having SOB in mid Feb 2018.  She gets winded walking short distances such as to her mailbox and back.  She also c/o cough- prod at times with minimal yellow to clear sputum. She states she has recently noticed bumps on her face.   onset of sob was indolent really since first of 2018 assoc with abd swelling and  mostly dry cough then noted "red bumps" on face and some aching of elbows but not ankles or wrists. No viz symptoms/ some anorexia but min wt loss  Doe = MMRC1 = can walk nl pace, flat grade, can't hurry or go uphills or steps s sob   rec Sarcoidosis is a benign inflammatory condition  Treatment is generally reserved for patients with major symptoms  schedule dermatology for skin biopsy> neg for sarcoid       06/04/2016  f/u ov/Amy Rosario re:  Sarcoidosis  Chief Complaint  Patient presents with  . Follow-up    4 wk f/u for sarcoidosis.   new problem = stiffness and swelling in hands / had skin bx but doesn't know her doctor's name that treats this "it's gout" No change doe = mmrc 1  rec Prednisone 10 mg take  4 each am x 2 days,   2 each am x 2 days,  1 each am x 2 days and stop  Please remember to go to the lab  department downstairs in the basement  for your tests - we will call you with the results when they are available. Stop tenormin and start bisoprolol  10 mg daily    07/03/2016  f/u ov/Amy Rosario re:  sarcoidosis  Chief Complaint  Patient presents with  . Follow-up    Pt c/o stiffness in her neck in the am's.  She also c/o swelling in both hands.   much better on pred, much worse off in terms of all her arthritis symptoms esp L hand / wrist  Doe continues at level = MMRC1 rec - FOB 07/05/16> severe diffuse cobblestoning :  Endobronchial bx:  NCG rec pred 20 mg daily until better then 10 mg daily and f/u in one month   08/17/2016  f/u ov/Amy Rosario re:  Sarcoid with prominent airway involvement on pred 10 mg x one week s flare  Chief Complaint  Patient presents with  . Follow-up    Arthritic pain improved on pred. Breathing is doing well and no new co's today.    main symptom arthritis elbows/ hands resolved/  Not limited by breathing from desired activities  But not very active rec Take zebeta (10 mg) each am and continue clonidine 0.2 three times day  Prednisone ceiling of 20 mg per day and floor of 5 mg with breakfast          09/28/2016  f/u ov/Amy Rosario re: sarcoid / still on  pred 10 mg daily/ did not try to taper ? Why  Chief Complaint  Patient presents with  . Follow-up    Breathing is doing well. No new co's today.    Not limited by breathing from desired activities  / no arthritis/ cough   Has not seen eye doctor   No obvious day to day or daytime variability or assoc chronic cough or cp or chest tightness, subjective wheeze or overt sinus or hb symptoms. No unusual exp hx or h/o childhood pna/ asthma or knowledge of premature birth.  Sleeping ok without nocturnal  or early am exacerbation  of respiratory  c/o's or need for noct saba. Also denies any obvious fluctuation of symptoms with weather or environmental changes or other aggravating or alleviating factors except as outlined above   Current Medications, Allergies, Complete Past Medical History, Past Surgical History, Family History, and Social History were reviewed in [employer] record.  ROS  The  following are not active complaints unless bolded sore throat, dysphagia, dental problems, itching, sneezing,  nasal congestion or excess/ purulent secretions, ear ache,   fever, chills, sweats, unintended wt loss, classically pleuritic or exertional cp, hemoptysis,  orthopnea pnd or leg swelling, presyncope, palpitations, abdominal pain, anorexia, nausea, vomiting, diarrhea  or change in bowel or bladder habits, change in stools or urine, dysuria,hematuria,  Rash resolved, arthralgias resolved, visual complaints, headache, numbness, weakness or ataxia or problems with walking or coordination,  change in mood/affect or memory.                    Objective:   Physical Exam    amb obese bf nad     09/28/2016       290  08/17/2016        284  07/03/2016        276   06/04/2016       267   04/18/16 263 lb (119.3 kg)  03/26/16 264 lb 6.4 oz (119.9 kg)  03/20/16 263 lb (119.3 kg)   9/6/ 2017            295    Vital signs reviewed   - Note on arrival 02 sats  94% on RA     HEENT: nl  turbinates bilaterally, and oropharynx. Nl external ear canals without cough reflex - poor dentitions    NECK :  without JVD/Nodes/TM/ nl carotid upstrokes bilaterally   LUNGS: no acc muscle use,  Nl contour chest which is clear to A and P bilaterally without cough on insp or exp maneuvers   CV:  RRR  no s3 or murmur or increase in P2, and no edema   ABD:  massivley obese but soft and nontender with nl inspiratory excursion in the supine position. No bruits or organomegaly appreciated, bowel sounds nl  MS:  Nl gait/ ext warm without deformities, calf tenderness, cyanosis or clubbing No obvious joint restrictions     SKIN: warm and dry  With no lesions  NEURO:  alert, approp, nl sensorium with  no motor or cerebellar deficits apparent.              Lab Results  Component Value Date   ESRSEDRATE 35 (H) 09/28/2016   ESRSEDRATE 119 (H) 06/04/2016   ESRSEDRATE 90 (H) 04/18/2016      Labs  ordered 09/28/2016   Angiotensin level     Assessment:

## 2016-09-28 NOTE — Patient Instructions (Addendum)
Please remember to go to the lab department downstairs in the basement  for your tests - we will call you with the results when they are available.     If breathing or coughing worse, pred should be 20 mg per day otherwise taper to  5 mg daily    patient coordinator   to schedule opthamology eval due to your sarcoidosis - we will be in touch   Please schedule a follow up visit in 2  months but call sooner if needed    .

## 2016-09-29 LAB — ANGIOTENSIN CONVERTING ENZYME: Angiotensin-Converting Enzyme: 94 U/L — ABNORMAL HIGH (ref 9–67)

## 2016-09-29 NOTE — Assessment & Plan Note (Addendum)
Body mass index is 42.68 kg/m.  -  trending up on prednisone so every effort to minimize reviewed with pt Lab Results  Component Value Date   TSH 3.60 03/08/2016     Contributing to dm/hbp and  gerd risk/ doe/reviewed the need and the process to achieve and maintain neg calorie balance > defer f/u primary care including intermittently monitoring thyroid status

## 2016-09-29 NOTE — Assessment & Plan Note (Addendum)
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 - ESR 06/04/2016  119 >  Prednisone 10 mg take  4 each am x 2 days,   2 each am x 2 days,  1 each am x 2 days and stop  - PFT's  06/04/2016  FVC  2.46 (73%)  No obst and no improvement from saba p nothing  prior to study with DLCO  64/63 % corrects to 105  % for alv volume   - ACE level 06/04/2016 = 260  - FOB 07/05/16> severe diffuse cobblestoning :  Endobronchial bx:  NCG rec pred 20 mg daily until better then 10 mg daily   - 08/17/2016 improved so taper to 5 mg daily as floor   - ESR 09/28/2016 down to 35 on 10 mg daily s symptoms> rec ceiling 20/ floor 5 mg daily  - referred to ophthalmology 09/28/2016   Last calcium  08/29/16  nl/ esr tredning down, no arthritis, sob or cough at 10 mg daily so should tol 5 mg daily   The goal with a chronic steroid dependent illness is always arriving at the lowest effective dose that controls the disease/symptoms and not accepting a set "formula" which is based on statistics or guidelines that don't always take into account patient  variability or the natural hx of the dz in every individual patient, which may well vary over time.  For now therefore I recommend the patient maintain   20 mg ceiling and 5 mg floor    Please schedule a follow up visit in 3 months but call sooner if needed     I had an extended discussion with the patient reviewing all relevant studies completed to date and  lasting 15 to 20 minutes of a 25 minute visit    Each maintenance medication was reviewed in detail including most importantly the difference between maintenance and prns and under what circumstances the prns are to be triggered using an action plan format that is not reflected in the computer generated alphabetically organized AVS.    Please see AVS for specific instructions unique to this visit that I personally wrote and  verbalized to the the pt in detail and then reviewed with pt  by my nurse highlighting any  changes in therapy recommended at today's visit to their plan of care.

## 2016-10-01 NOTE — Progress Notes (Signed)
Spoke with pt and notified of results per Dr. Wert. Pt verbalized understanding and denied any questions. 

## 2016-10-02 LAB — PROTEIN ELECTROPHORESIS, SERUM
ALBUMIN ELP: 4.1 g/dL (ref 3.8–4.8)
Alpha-1-Globulin: 0.2 g/dL (ref 0.2–0.3)
Alpha-2-Globulin: 0.7 g/dL (ref 0.5–0.9)
BETA GLOBULIN: 0.5 g/dL (ref 0.4–0.6)
Beta 2: 0.4 g/dL (ref 0.2–0.5)
Gamma Globulin: 1.3 g/dL (ref 0.8–1.7)
TOTAL PROTEIN, SERUM ELECTROPHOR: 7.2 g/dL (ref 6.1–8.1)

## 2016-10-03 LAB — HM DIABETES EYE EXAM

## 2016-10-04 ENCOUNTER — Encounter: Payer: Self-pay | Admitting: Medical

## 2016-10-04 ENCOUNTER — Telehealth: Payer: Self-pay

## 2016-10-04 ENCOUNTER — Other Ambulatory Visit: Payer: Self-pay

## 2016-10-04 ENCOUNTER — Telehealth: Payer: Self-pay | Admitting: Medical

## 2016-10-04 ENCOUNTER — Ambulatory Visit (INDEPENDENT_AMBULATORY_CARE_PROVIDER_SITE_OTHER): Payer: BC Managed Care – PPO | Admitting: Medical

## 2016-10-04 VITALS — BP 140/86 | HR 63 | Wt 287.6 lb

## 2016-10-04 DIAGNOSIS — Z7185 Encounter for immunization safety counseling: Secondary | ICD-10-CM | POA: Insufficient documentation

## 2016-10-04 DIAGNOSIS — Z23 Encounter for immunization: Secondary | ICD-10-CM

## 2016-10-04 DIAGNOSIS — E785 Hyperlipidemia, unspecified: Secondary | ICD-10-CM | POA: Diagnosis not present

## 2016-10-04 DIAGNOSIS — I10 Essential (primary) hypertension: Secondary | ICD-10-CM

## 2016-10-04 DIAGNOSIS — K760 Fatty (change of) liver, not elsewhere classified: Secondary | ICD-10-CM | POA: Diagnosis not present

## 2016-10-04 DIAGNOSIS — E118 Type 2 diabetes mellitus with unspecified complications: Secondary | ICD-10-CM

## 2016-10-04 DIAGNOSIS — Z794 Long term (current) use of insulin: Secondary | ICD-10-CM

## 2016-10-04 DIAGNOSIS — D869 Sarcoidosis, unspecified: Secondary | ICD-10-CM | POA: Diagnosis not present

## 2016-10-04 DIAGNOSIS — L602 Onychogryphosis: Secondary | ICD-10-CM

## 2016-10-04 DIAGNOSIS — G4733 Obstructive sleep apnea (adult) (pediatric): Secondary | ICD-10-CM

## 2016-10-04 DIAGNOSIS — M1A9XX0 Chronic gout, unspecified, without tophus (tophi): Secondary | ICD-10-CM

## 2016-10-04 DIAGNOSIS — Z7189 Other specified counseling: Secondary | ICD-10-CM | POA: Diagnosis not present

## 2016-10-04 DIAGNOSIS — Z9989 Dependence on other enabling machines and devices: Secondary | ICD-10-CM

## 2016-10-04 DIAGNOSIS — N289 Disorder of kidney and ureter, unspecified: Secondary | ICD-10-CM

## 2016-10-04 MED ORDER — ONETOUCH LANCETS MISC: 100.0000 "pen " | Freq: Two times a day (BID) | 3 refills | Status: DC

## 2016-10-04 MED ORDER — VITAMIN D (ERGOCALCIFEROL) 1.25 MG (50000 UNIT) PO CAPS: 50000.0000 [IU] | ORAL_CAPSULE | ORAL | 3 refills | Status: DC

## 2016-10-04 MED ORDER — ONETOUCH ULTRA 2 W/DEVICE KIT: 1.0000 | PACK | Freq: Two times a day (BID) | 0 refills | Status: AC

## 2016-10-04 MED ORDER — GLUCOSE BLOOD VI STRP: ORAL_STRIP | 12 refills | Status: DC

## 2016-10-04 NOTE — Progress Notes (Signed)
Subjective: Chief Complaint  Patient presents with  . follow up    follow up from dm    Here for 48mo f/u.     Here to discuss labs from last visit.  Glucose 316 last visit.  HgbA1C 12.9% last visit. LDL last visit 99.  Need to be under 70.  Last visit her cholesterol and diabetes markers were way too high, vit D low.   We made a referral to endocrinology.  We increased her Novolog 70/30 to 25u BID.  We started her on Vit D weekly 50,000.     HTN - last visit we continued Bisoprolol and Clonidine, and we added back Exforge.  excforge was not on the recall list.  Hyperlipidemia - compliant with Crestor 40mg   Saw eye doctor yesterday.   They want to recheck in December before issuing glasses given the diabets fluctuations with prednisone currently.   No glaucoma.   Sees. Dr. Eulas Post.   Sarcoidosis - may be getting off prednisone soon per Dr. Melvyn Novas.   Past Medical History:  Diagnosis Date  . Back pain   . Diabetes mellitus without complication ([place])   . Goiter    left side, saw dr Mirakle Tomlin april 2015 for, had biopsy done was told it is cyst  . H/O echocardiogram    Dr. Charolette Forward, [facility]  . Hyperlipidemia   . Hypertension 2000  . Impaired fasting blood sugar 2014  . Obesity   . Other screening mammogram    planned for 05/2013  . Routine gynecological examination    last pap 2012  . Sleep apnea    on CPAP setting of 2   Current Outpatient Prescriptions on File Prior to Visit  Medication Sig Dispense Refill  . allopurinol (ZYLOPRIM) 100 MG tablet Take 1 tablet (100 mg total) by mouth daily. 30 tablet 2  . Amlodipine-Valsartan-HCTZ (EXFORGE HCT) 10-320-25 MG TABS Take 1 tablet by mouth daily. 30 tablet 1  . aspirin 81 MG tablet Take 1 tablet (81 mg total) by mouth daily. 90 tablet 3  . bisoprolol (ZEBETA) 10 MG tablet Take 1 tablet (10 mg total) by mouth daily. 30 tablet 11  . cloNIDine (CATAPRES) 0.2 MG tablet Take 1 tablet (0.2 mg total) by mouth 3  (three) times daily. 90 tablet 5  . gabapentin (NEURONTIN) 100 MG capsule Take 1 capsule (100 mg total) by mouth at bedtime. 90 capsule 3  . glucose blood test strip Use twice a day. E29.1  Contour Next Test Strips. 100 each 12  . insulin aspart protamine - aspart (NOVOLOG MIX 70/30 FLEXPEN) (70-30) 100 UNIT/ML FlexPen Inject 0.25 mLs (25 Units total) into the skin 2 (two) times daily with a meal. 15 pen 2  . Insulin Pen Needle 32G X 4 MM MISC 32 g by Does not apply route 2 (two) times daily. 90 each 5  . meclizine (ANTIVERT) 25 MG tablet TAKE 1 TABLET (25 MG TOTAL) BY MOUTH 2 (TWO) TIMES DAILY. 30 tablet 0  . potassium chloride (KLOR-CON 10) 10 MEQ tablet Take 1 tablet (10 mEq total) by mouth daily. 90 tablet 3  . predniSONE (DELTASONE) 10 MG tablet Take 1 tablet (10 mg total) by mouth daily with breakfast. (Patient taking differently: Take by mouth daily with breakfast. ) 60 tablet 1  . rosuvastatin (CRESTOR) 40 MG tablet Take 1 tablet (40 mg total) by mouth daily. 90 tablet 0   No current facility-administered medications on file prior to visit.    ROS  as in subjective   Objective: BP 140/86   Pulse 63   Wt 287 lb 9.6 oz (130.5 kg)   LMP 11/09/2013   SpO2 95%   BMI 42.47 kg/m    Wt Readings from Last 3 Encounters:  10/04/16 287 lb 9.6 oz (130.5 kg)  09/28/16 289 lb (131.1 kg)  08/29/16 287 lb 9.6 oz (130.5 kg)   BP Readings from Last 3 Encounters:  10/04/16 140/86  09/28/16 128/84  08/29/16 (!) 170/100    General appearance: alert, no distress, WD/WN,  Neck: supple, no lymphadenopathy, +moderate goiter, no distinct nodules, no masses Heart: RRR, normal S1, S2, no murmurs Lungs: CTA bilaterally, no wheezes, rhonchi, or rales Abdomen: +bs, soft, non tender, non distended, no masses, no hepatomegaly, no splenomegaly Pulses: 1+ symmetric, upper extremities, normal cap refill Ext: no edema thickened and elongated toenails bilat, normal monofilament exam, 1-2 + pedal pulses.     Assessment: Encounter Diagnoses  Name Primary?  . Type 2 diabetes mellitus with complication, with long-term current use of insulin ([place]) Yes  . Essential hypertension, benign   . OSA on CPAP   . Fatty liver   . Renal insufficiency   . Chronic gout without tophus, unspecified cause, unspecified site   . Hyperlipidemia, unspecified hyperlipidemia type   . Sarcoidosis   . Nail hypertrophy   . Need for influenza vaccination   . Vaccine counseling      Plan: We discussed her labs from last visit, med changes advised.   Recommendations discussed: We will refer you to podiatry for nail and foot care  Follow up with Dr. Melvyn Novas about the Sarcoidosis.  I hope you are able to get off prednisone soon which will help your sugars  We are referring you to endocrinology/diabetes specialty to help get your sugars under better control.  Blood pressure  Continue current medications, but go ahead and follow up with Dr. Terrence Dupont  Cholesterol  The goal is to have your LDL bad cholesterol under 70.  Yours is around 100.  You are already on Crestor 40mg   I would like to have you work on lowering cholesterol in the diet, but also consider another medication to help get the numbers to goal to lower your heart disease risk  Think about this but if agreeable we can try and get you approved for something like Repatha to further lower your numbers  I recommend they exercise regularly such as 30 minute or more 5+ days per week  I recommend they eat 3-4 fruit servings daily, vegetables every meal, and get whole grains in the diet such as 1 cup serving of brown rice or whole grain pasta, or barley, or other whole grain, or 2 slices of whole grain bread at a meal  I recommend they get health fats in the diet such as almonds, avocado, fish, and olive oil for example  I recommend they cut back on meat and dairy.  We don't need meat every meal or even every day.   Meat is general is high in calories and  cholesterol  I recommend they significantly limit animal fat, sweets and baked goods such as cookies, chips, donuts, cakes, and pies   Limit butter, margarine, ice cream, and other unhealthy snack foods  Vaccines: I recommend you have a shingles vaccine to help prevent shingles or herpes zoster outbreak.   Please call your insurer to inquire about coverage for the Shingrix vaccine given in 2 doses.   Some insurers cover this vaccine  after age 69, some cover this after age 57.  If your insurer covers this, then call to schedule appointment to have this vaccine here.  We updated your flu shot today  Continue your CPAP daily.  Counseled on the influenza virus vaccine.  Vaccine information sheet given.  Influenza vaccine given after consent obtained.  Next visit consider ABIs if not done, review cardiology, podiatry, endocrinology and pulmonology notes.  Marleena was seen today for follow up.  Diagnoses and all orders for this visit:  Type 2 diabetes mellitus with complication, with long-term current use of insulin ([place]) -     Ambulatory referral to Podiatry -     Ambulatory referral to Endocrinology  Essential hypertension, benign  OSA on CPAP  Fatty liver  Renal insufficiency  Chronic gout without tophus, unspecified cause, unspecified site  Hyperlipidemia, unspecified hyperlipidemia type  Sarcoidosis  Nail hypertrophy -     Ambulatory referral to Podiatry  Need for influenza vaccination  Vaccine counseling  Other orders -     Vitamin D, Ergocalciferol, (DRISDOL) 50000 units CAPS capsule; Take 1 capsule (50,000 Units total) by mouth every 7 (seven) days.

## 2016-10-04 NOTE — Telephone Encounter (Signed)
Fax request rcvd stating that insurance will only cover one touch ultra or one touch verio. Amy Rosario

## 2016-10-04 NOTE — Addendum Note (Signed)
Addended by: Tyrone Apple on: 10/04/2016 09:34 AM   Modules accepted: Orders

## 2016-10-04 NOTE — Telephone Encounter (Signed)
Scan/abstract please

## 2016-10-04 NOTE — Patient Instructions (Addendum)
We will refer you to podiatry for nail and foot care  Follow up with Dr. Melvyn Novas about the Sarcoidosis.  I hope you are able to get off prednisone soon which will help your sugars  We are referring you to endocrinology/diabetes specialty to help get your sugars under better control.  Blood pressure  Continue current medications, but go ahead and follow up with Dr. Terrence Dupont  Cholesterol  The goal is to have your LDL bad cholesterol under 70.  Yours is around 100.  You are already on Crestor 40mg   I would like to have you work on lowering cholesterol in the diet, but also consider another medication to help get the numbers to goal to lower your heart disease risk  Think about this but if agreeable we can try and get you approved for something like Repatha to further lower your numbers  I recommend they exercise regularly such as 30 minute or more 5+ days per week  I recommend they eat 3-4 fruit servings daily, vegetables every meal, and get whole grains in the diet such as 1 cup serving of brown rice or whole grain pasta, or barley, or other whole grain, or 2 slices of whole grain bread at a meal  I recommend they get health fats in the diet such as almonds, avocado, fish, and olive oil for example  I recommend they cut back on meat and dairy.  We don't need meat every meal or even every day.   Meat is general is high in calories and cholesterol  I recommend they significantly limit animal fat, sweets and baked goods such as cookies, chips, donuts, cakes, and pies   Limit butter, margarine, ice cream, and other unhealthy snack foods  Vaccines: I recommend you have a shingles vaccine to help prevent shingles or herpes zoster outbreak.   Please call your insurer to inquire about coverage for the Shingrix vaccine given in 2 doses.   Some insurers cover this vaccine after age 40, some cover this after age 66.  If your insurer covers this, then call to schedule appointment to have this vaccine  here.  We updated your flu shot today  Continue your CPAP daily.    Evolocumab injection What is this medicine? EVOLOCUMAB (e voe LOK ue mab) is known as a PCSK9 inhibitor. It is used to lower the level of cholesterol in the blood. It may be used alone or in combination with other cholesterol-lowering drugs. This drug may also be used to reduce the risk of heart attack, stroke, and certain types of heart surgery in patients with heart disease. This medicine may be used for other purposes; ask your health care provider or pharmacist if you have questions. COMMON BRAND NAME(S): REPATHA What should I tell my health care provider before I take this medicine? They need to know if you have any of these conditions: -an unusual or allergic reaction to evolocumab, other medicines, foods, dyes, or preservatives -pregnant or trying to get pregnant -breast-feeding How should I use this medicine? This medicine is for injection under the skin. You will be taught how to prepare and give this medicine. Use exactly as directed. Take your medicine at regular intervals. Do not take your medicine more often than directed. It is important that you put your used needles and syringes in a special sharps container. Do not put them in a trash can. If you do not have a sharps container, call your pharmacist or health care provider to get one. Talk  to your pediatrician regarding the use of this medicine in children. While this drug may be prescribed for children as young as 13 years for selected conditions, precautions do apply. Overdosage: If you think you have taken too much of this medicine contact a poison control center or emergency room at once. NOTE: This medicine is only for you. Do not share this medicine with others. What if I miss a dose? If you miss a dose, take it as soon as you can if there are more than 7 days until the next scheduled dose, or skip the missed dose and take the next dose according to  your original schedule. Do not take double or extra doses. What may interact with this medicine? Interactions are not expected. This list may not describe all possible interactions. Give your health care provider a list of all the medicines, herbs, non-prescription drugs, or dietary supplements you use. Also tell them if you smoke, drink alcohol, or use illegal drugs. Some items may interact with your medicine. What should I watch for while using this medicine? You may need blood work while you are taking this medicine. What side effects may I notice from receiving this medicine? Side effects that you should report to your doctor or health care professional as soon as possible: -allergic reactions like skin rash, itching or hives, swelling of the face, lips, or tongue -signs and symptoms of infection like fever or chills; cough; sore throat; pain or trouble passing urine Side effects that usually do not require medical attention (report to your doctor or health care professional if they continue or are bothersome): -diarrhea -nausea -muscle pain -pain, redness, or irritation at site where injected This list may not describe all possible side effects. Call your doctor for medical advice about side effects. You may report side effects to FDA at 1-800-FDA-1088. Where should I keep my medicine? Keep out of the reach of children. You will be instructed on how to store this medicine. Throw away any unused medicine after the expiration date on the label. NOTE: This sheet is a summary. It may not cover all possible information. If you have questions about this medicine, talk to your doctor, pharmacist, or health care provider.  2018 Elsevier/Gold Standard (2016-01-09 13:21:53)

## 2016-10-04 NOTE — Telephone Encounter (Signed)
Sent order.

## 2016-10-04 NOTE — Telephone Encounter (Signed)
received diabetic eye exam put in your folder

## 2016-10-04 NOTE — Addendum Note (Signed)
Addended by: Arley Phenix L on: 10/04/2016 11:03 AM   Modules accepted: Orders

## 2016-10-05 ENCOUNTER — Encounter: Payer: Self-pay | Admitting: Internal Medicine

## 2016-10-05 ENCOUNTER — Ambulatory Visit (INDEPENDENT_AMBULATORY_CARE_PROVIDER_SITE_OTHER): Payer: BC Managed Care – PPO | Admitting: Internal Medicine

## 2016-10-05 VITALS — BP 188/92 | HR 64 | Resp 16 | Ht 69.0 in | Wt 286.2 lb

## 2016-10-05 DIAGNOSIS — Z794 Long term (current) use of insulin: Secondary | ICD-10-CM

## 2016-10-05 DIAGNOSIS — E1165 Type 2 diabetes mellitus with hyperglycemia: Secondary | ICD-10-CM | POA: Diagnosis not present

## 2016-10-05 LAB — GLUCOSE, POCT (MANUAL RESULT ENTRY): POC GLUCOSE: 484 mg/dL — AB (ref 70–99)

## 2016-10-05 MED ORDER — INSULIN GLARGINE 300 UNIT/ML ~~LOC~~ SOPN: 36.0000 [IU] | PEN_INJECTOR | Freq: Every day | SUBCUTANEOUS | 11 refills | Status: DC

## 2016-10-05 MED ORDER — METFORMIN HCL 1000 MG PO TABS: 1000.0000 mg | ORAL_TABLET | Freq: Two times a day (BID) | ORAL | 3 refills | Status: DC

## 2016-10-05 MED ORDER — INSULIN PEN NEEDLE 32G X 4 MM MISC: 6 refills | Status: DC

## 2016-10-05 MED ORDER — INSULIN ASPART 100 UNIT/ML FLEXPEN: 15.0000 [IU] | PEN_INJECTOR | Freq: Three times a day (TID) | SUBCUTANEOUS | 11 refills | Status: DC

## 2016-10-05 NOTE — Patient Instructions (Addendum)
Please start Metformin 500 mg with dinner x 4 days. If you tolerate this well, add another Metformin tablet (500 mg) with breakfast x 4 days. If you tolerate this well, add another metformin tablet with dinner (total 1000 mg) x 4 days. If you tolerate this well, add another metformin tablet with breakfast (total 1000 mg). Continue with 1000 mg of metformin 2x a day with breakfast and dinner.  Please stop 70/30 insulin.  Please start: - Toujeo 36 units at bedtime - Novolog: Before a smaller meal: 15 units  Before a larger meal: 18 units  Please return in 1.5 months with your sugar log.   PATIENT INSTRUCTIONS FOR TYPE 2 DIABETES:  **Please join MyChart!** - see attached instructions about how to join if you have not done so already.  DIET AND EXERCISE Diet and exercise is an important part of diabetic treatment.  We recommended aerobic exercise in the form of brisk walking (working between 40-60% of maximal aerobic capacity, similar to brisk walking) for 150 minutes per week (such as 30 minutes five days per week) along with 3 times per week performing 'resistance' training (using various gauge rubber tubes with handles) 5-10 exercises involving the major muscle groups (upper body, lower body and core) performing 10-15 repetitions (or near fatigue) each exercise. Start at half the above goal but build slowly to reach the above goals. If limited by weight, joint pain, or disability, we recommend daily walking in a swimming pool with water up to waist to reduce pressure from joints while allow for adequate exercise.    BLOOD GLUCOSES Monitoring your blood glucoses is important for continued management of your diabetes. Please check your blood glucoses 2-4 times a day: fasting, before meals and at bedtime (you can rotate these measurements - e.g. one day check before the 3 meals, the next day check before 2 of the meals and before bedtime, etc.).   HYPOGLYCEMIA (low blood sugar) Hypoglycemia is  usually a reaction to not eating, exercising, or taking too much insulin/ other diabetes drugs.  Symptoms include tremors, sweating, hunger, confusion, headache, etc. Treat IMMEDIATELY with 15 grams of Carbs: . 4 glucose tablets .  cup regular juice/soda . 2 tablespoons raisins . 4 teaspoons sugar . 1 tablespoon honey Recheck blood glucose in 15 mins and repeat above if still symptomatic/blood glucose <100.  RECOMMENDATIONS TO REDUCE YOUR RISK OF DIABETIC COMPLICATIONS: * Take your prescribed MEDICATION(S) * Follow a DIABETIC diet: Complex carbs, fiber rich foods, (monounsaturated and polyunsaturated) fats * AVOID saturated/trans fats, high fat foods, >2,300 mg salt per day. * EXERCISE at least 5 times a week for 30 minutes or preferably daily.  * DO NOT SMOKE OR DRINK more than 1 drink a day. * Check your FEET every day. Do not wear tightfitting shoes. Contact us if you develop an ulcer * See your EYE doctor once a year or more if needed * Get a FLU shot once a year * Get a PNEUMONIA vaccine once before and once after age 44 years  GOALS:  * Your Hemoglobin A1c of <7%  * fasting sugars need to be <130 * after meals sugars need to be <180 (2h after you start eating) * Your Systolic BP should be 527 or lower  * Your Diastolic BP should be 80 or lower  * Your HDL (Good Cholesterol) should be 40 or higher  * Your LDL (Bad Cholesterol) should be 100 or lower. * Your Triglycerides should be 150 or lower  *  Your Urine microalbumin (kidney function) should be <30 * Your Body Mass Index should be 25 or lower    Please consider the following ways to cut down carbs and fat and increase fiber and micronutrients in your diet: - substitute whole grain for white bread or pasta - substitute brown rice for white rice - substitute 90-calorie flat bread pieces for slices of bread when possible - substitute sweet potatoes or yams for white potatoes - substitute humus for margarine - substitute  tofu for cheese when possible - substitute almond or rice milk for regular milk (would not drink soy milk daily due to concern for soy estrogen influence on breast cancer risk) - substitute dark chocolate for other sweets when possible - substitute water - can add lemon or orange slices for taste - for diet sodas (artificial sweeteners will trick your body that you can eat sweets without getting calories and will lead you to overeating and weight gain in the long run) - do not skip breakfast or other meals (this will slow down the metabolism and will result in more weight gain over time)  - can try smoothies made from fruit and almond/rice milk in am instead of regular breakfast - can also try old-fashioned (not instant) oatmeal made with almond/rice milk in am - order the dressing on the side when eating salad at a restaurant (pour less than half of the dressing on the salad) - eat as little meat as possible - can try juicing, but should not forget that juicing will get rid of the fiber, so would alternate with eating raw veg./fruits or drinking smoothies - use as little oil as possible, even when using olive oil - can dress a salad with a mix of balsamic vinegar and lemon juice, for e.g. - use agave nectar, stevia sugar, or regular sugar rather than artificial sweateners - steam or broil/roast veggies  - snack on veggies/fruit/nuts (unsalted, preferably) when possible, rather than processed foods - reduce or eliminate aspartame in diet (it is in diet sodas, chewing gum, etc) Read the labels!  Try to read Dr. Janene Harvey book: "Program for Reversing Diabetes" for other ideas for healthy eating.

## 2016-10-05 NOTE — Progress Notes (Signed)
Patient ID: Amy Rosario, female   DOB: December 19, 1960, 56 y.o.   MRN: [id number]   HPI: Amy Rosario is a 56 y.o.-year-old female, referred by her PCP, Dr. Glade Lloyd, for management of DM2, dx in 2015-2016, insulin-dependent since dx, uncontrolled, without long term complications.   She is on Prednisone 10 mg for sarcoidosis since 08/2016, prev. 20 mg daily (started 06/2016).   Her sugars were exemplary in 06/2016 and started to increase in 07/2016, after she was started on prednisone. In the last month, her sugars are mostly in the 300-500 range. In the office today, her sugar is 484.  Last hemoglobin A1c was: Lab Results  Component Value Date   HGBA1C 12.9 (H) 08/29/2016   HGBA1C 8.5 02/03/2016   HGBA1C 10.8 (H) 10/12/2015   Pt is on a regimen of: - Novolog 70/30 25 units 2x a day - increased 10/04/2016. She was on Metformin 1000 mg 2x a day, with meals >> cannot remember why she was taken off the med.  Pt checks her sugars 1x a day and they are: - am: 326-525 (last month) - 2h after b'fast: n/c - before lunch: n/c - 2h after lunch: n/c - before dinner: n/c - 2h after dinner: n/c - bedtime: n/c - nighttime: n/c No lows. Lowest sugar was 265 in last 1.5 mo; she has hypoglycemia awareness at 70.  Highest sugar was 525.  Glucometer: [employer]  Pt's meals are: - Breakfast: sausage, egg biscuits, hash brown,  - Lunch: chicken sandwich, mac and cheese - Dinner: steak and Gravy, green beans, corn, roll - Snacks: chips, 4 cookies  - no CKD, last BUN/creatinine:  Lab Results  Component Value Date   BUN 17 08/29/2016   BUN 23 03/26/2016   CREATININE 0.88 08/29/2016   CREATININE 1.51 (H) 03/26/2016  On Valsartan 320.  - last set of lipids: Lab Results  Component Value Date   CHOL 194 08/29/2016   HDL 73 08/29/2016   LDLCALC 99 08/29/2016   TRIG 109 08/29/2016   CHOLHDL 2.7 08/29/2016  On Crestor 40. - last eye exam was in 10/03/2016 >> No DR.  + Floaters. - no  numbness and tingling in her feet.  Pt has FH of DM in brother.  Of note, she also has a history of goiter and thyroid nodules - the report of the ultrasound from 06/19/2013 was reviewed today: enlarged, heterogeneous, thyroid, with a 1.9 cm dominant complex nodule in the left lobe. Of note, this was biopsied in 2015 and the biopsy results was benign.   ROS: Constitutional:+ Both  weight gain/loss, no fatigue, no subjective hyperthermia/hypothermia Eyes:+ Blurry vision, no xerophthalmia ENT: no sore throat, no nodules palpated in throat, no dysphagia/odynophagia, no hoarseness Cardiovascular: no CP/+ SOB/no palpitations/l + eg swelling Respiratory: + cough/+ SOB Gastrointestinal: no N/V/D/C Musculoskeletal: no muscle/joint aches Skin: no rashes Neurological: no tremors/numbness/tingling/dizziness Psychiatric: no depression/anxiety + Libido low   Past Medical History:  Diagnosis Date  . Back pain   . Diabetes mellitus without complication ([place])   . Goiter    left side, saw dr tysinger april 2015 for, had biopsy done was told it is cyst  . H/O echocardiogram    Dr. Charolette Forward, [facility]  . Hyperlipidemia   . Hypertension 2000  . Impaired fasting blood sugar 2014  . Obesity   . Other screening mammogram    planned for 05/2013  . Routine gynecological examination    last pap 2012  . Sleep apnea  on CPAP setting of 2   Past Surgical History:  Procedure Laterality Date  . COLONOSCOPY WITH PROPOFOL N/A 09/21/2013   Procedure: COLONOSCOPY WITH PROPOFOL;  Surgeon: Juanita Craver, MD;  Location: [facility];  Service: Endoscopy;  Laterality: N/A;  . CYST EXCISION     right low back  . TUBAL LIGATION    . VIDEO BRONCHOSCOPY Bilateral 07/05/2016   Procedure: VIDEO BRONCHOSCOPY WITH FLUORO;  Surgeon: Tanda Rockers, MD;  Location: [facility];  Service: Endoscopy;  Laterality: Bilateral;   Social History   Social History  . Marital status: Single     Spouse name: N/A  . Number of children: 3   Occupational History   . Smoking status: Never Smoker  . Smokeless tobacco: Never Used  . Alcohol use No  . Drug use: No   Social History Narrative   Lives with sister [name], single, 3 sons, Exercise -walks a lot on the job, Consulting civil engineer at [employer]. [place]    Current Outpatient Prescriptions on File Prior to Visit  Medication Sig Dispense Refill  . allopurinol (ZYLOPRIM) 100 MG tablet Take 1 tablet (100 mg total) by mouth daily. 30 tablet 2  . Amlodipine-Valsartan-HCTZ (EXFORGE HCT) 10-320-25 MG TABS Take 1 tablet by mouth daily. 30 tablet 1  . aspirin 81 MG tablet Take 1 tablet (81 mg total) by mouth daily. 90 tablet 3  . bisoprolol (ZEBETA) 10 MG tablet Take 1 tablet (10 mg total) by mouth daily. 30 tablet 11  . Blood Glucose Monitoring Suppl (ONE TOUCH ULTRA 2) w/Device KIT 1 Device by Does not apply route 2 (two) times daily. 1 each 0  . cloNIDine (CATAPRES) 0.2 MG tablet Take 1 tablet (0.2 mg total) by mouth 3 (three) times daily. 90 tablet 5  . gabapentin (NEURONTIN) 100 MG capsule Take 1 capsule (100 mg total) by mouth at bedtime. 90 capsule 3  . glucose blood (CONTOUR NEXT TEST) test strip Test twice daily  E11.9 100 each 12  . insulin aspart protamine - aspart (NOVOLOG MIX 70/30 FLEXPEN) (70-30) 100 UNIT/ML FlexPen Inject 0.25 mLs (25 Units total) into the skin 2 (two) times daily with a meal. 15 pen 2  . Insulin Pen Needle 32G X 4 MM MISC 32 g by Does not apply route 2 (two) times daily. 90 each 5  . meclizine (ANTIVERT) 25 MG tablet TAKE 1 TABLET (25 MG TOTAL) BY MOUTH 2 (TWO) TIMES DAILY. 30 tablet 0  . ONE TOUCH LANCETS MISC 100 pens by Does not apply route 2 (two) times daily. 100 each 3  . potassium chloride (KLOR-CON 10) 10 MEQ tablet Take 1 tablet (10 mEq total) by mouth daily. 90 tablet 3  . predniSONE (DELTASONE) 10 MG tablet Take 1 tablet (10 mg total) by mouth daily with breakfast.  (Patient taking differently: Take by mouth daily with breakfast. ) 60 tablet 1  . rosuvastatin (CRESTOR) 40 MG tablet Take 1 tablet (40 mg total) by mouth daily. 90 tablet 0  . Vitamin D, Ergocalciferol, (DRISDOL) 50000 units CAPS capsule Take 1 capsule (50,000 Units total) by mouth every 7 (seven) days. 12 capsule 3   No current facility-administered medications on file prior to visit.    Allergies  Allergen Reactions  . Penicillins Hives and Swelling    Has patient had a PCN reaction causing immediate rash, facial/tongue/throat swelling, SOB or lightheadedness with hypotension: Yes Has patient had a PCN reaction causing severe rash involving mucus membranes or skin  necrosis: No Has patient had a PCN reaction that required hospitalization: No Has patient had a PCN reaction occurring within the last 10 years: No If all of the above answers are "NO", then may proceed with Cephalosporin use.   Family History  Problem Relation Age of Onset  . Heart disease Father   . Hypertension Father   . Heart disease Brother   . Cancer Neg Hx   . Stroke Neg Hx   . Diabetes Neg Hx    PE: BP (!) 188/92   Pulse 64   Resp 16   Ht _0  (1.753 m)   Wt 286 lb 3.2 oz (129.8 kg)   LMP 11/09/2013   SpO2 93%   BMI 42.26 kg/m  Wt Readings from Last 3 Encounters:  10/05/16 286 lb 3.2 oz (129.8 kg)  10/04/16 287 lb 9.6 oz (130.5 kg)  09/28/16 289 lb (131.1 kg)   Constitutional: Obese, in NAD Eyes: PERRLA, EOMI, no exophthalmos ENT: moist mucous membranes, + Symmetrical thyromegaly, no cervical lymphadenopathy Cardiovascular: RRR, No MRG Respiratory: CTA B Gastrointestinal: abdomen soft, NT, ND, BS+ Musculoskeletal: no deformities, strength intact in all 4 Skin: moist, warm, no rashes Neurological: no tremor with outstretched hands, DTR normal in all 4  ASSESSMENT: 1. DM2, insulin-dependent, uncontrolled, without  long-term complications, but with significant hyperglycemia   PLAN:  1. Patient  with long-standing, uncontrolled diabetes, on  premixed insulin regimen which worked well for her until she started prednisone 2 months ago. Her sugars significantly increased afterwards despite increasing the dose of her 70/30 insulin. which became insufficient. CBG 484. at home, they vary between 300-500  - we discussed that the premixed insulin regimen is not ideal to manage her very significant hyperglycemia and I suggested to start the basal-bolus insulin regimen. She agrees with this. We will start toujeo at bedtime and NovoLog before every meal I also suggested to add back metformin, which can help lowering her liver glucose production which is revved up by her prednisone treatment.  - We also discussed about her meals and I made several suggestions to reduce the amount of fat and concentrated carbs in her diet  - I suggested to:  Patient Instructions  Please start Metformin 500 mg with dinner x 4 days. If you tolerate this well, add another Metformin tablet (500 mg) with breakfast x 4 days. If you tolerate this well, add another metformin tablet with dinner (total 1000 mg) x 4 days. If you tolerate this well, add another metformin tablet with breakfast (total 1000 mg). Continue with 1000 mg of metformin 2x a day with breakfast and dinner.  Please stop 70/30 insulin.  Please start: - Toujeo 36 units at bedtime - Novolog: Before a smaller meal: 15 units  Before a larger meal: 18 units  Please return in 1.5 months with your sugar log.   - Strongly advised her to start checking sugars at different times of the day - check 2-3 times a day, rotating checks - given sugar log and advised how to fill it and to bring it at next appt  - given foot care handout and explained the principles  - given instructions for hypoglycemia management "15-15 rule"  - advised for yearly eye exams  - Return to clinic in 1.5 mo with sugar log   Philemon Kingdom, MD PhD [facility] Endocrinology

## 2016-10-11 ENCOUNTER — Telehealth: Payer: Self-pay | Admitting: Pediatrics

## 2016-10-11 MED ORDER — INSULIN DEGLUDEC 200 UNIT/ML ~~LOC~~ SOPN: 36.0000 [IU] | PEN_INJECTOR | Freq: Every day | SUBCUTANEOUS | 2 refills | Status: DC

## 2016-10-11 NOTE — Telephone Encounter (Signed)
Let's send the same dose of Tresiba U200 (not U100).

## 2016-10-11 NOTE — Telephone Encounter (Signed)
Pharmacy alternative to [facility] requested: Basaglar, Levemir, Zeb Comfort are preferred.  Would you like to change or initiate PA?

## 2016-10-15 ENCOUNTER — Telehealth: Payer: Self-pay | Admitting: Internal Medicine

## 2016-10-15 NOTE — Telephone Encounter (Signed)
MEDICATION: Toujeo   PHARMACY:   [id number] - [place], [place] - [address] AT [place] [phone] (Phone) [phone] (Fax)     IS THIS A 90 DAY SUPPLY : N/A  IS PATIENT OUT OF MEDICATION: Y  IF NOT; HOW MUCH IS LEFT:   LAST APPOINTMENT DATE: 10/05/16  NEXT APPOINTMENT DATE: 11/19/16  OTHER COMMENTS: Patient stated pharmacy never received this from her last visit. Please call patient with any questions. OK to leave message.     **Let patient know to contact pharmacy at the end of the day to make sure medication is ready. **  ** Please notify patient to allow 48-72 hours to process**  **Encourage patient to contact the pharmacy for refills or they can request refills through [facility]**

## 2016-10-16 ENCOUNTER — Other Ambulatory Visit: Payer: Self-pay

## 2016-10-16 MED ORDER — INSULIN DEGLUDEC 200 UNIT/ML ~~LOC~~ SOPN: 36.0000 [IU] | PEN_INJECTOR | Freq: Every day | SUBCUTANEOUS | 2 refills | Status: DC

## 2016-10-16 NOTE — Telephone Encounter (Signed)
Submitted

## 2016-10-16 NOTE — Telephone Encounter (Signed)
Please advise, I do not see Toujeo on the med list, but I see Tyler Aas. Okay to submit that for the refill? Thanks!

## 2016-10-16 NOTE — Telephone Encounter (Signed)
Let's send [place] U200 as Toujeo not covered. Same dose.

## 2016-10-21 ENCOUNTER — Other Ambulatory Visit: Payer: Self-pay | Admitting: Medical

## 2016-10-22 NOTE — Telephone Encounter (Signed)
Can pt have a refill on meds  

## 2016-10-23 ENCOUNTER — Encounter: Payer: Self-pay | Admitting: Internal Medicine

## 2016-11-16 ENCOUNTER — Other Ambulatory Visit: Payer: Self-pay | Admitting: Medical

## 2016-11-16 DIAGNOSIS — M792 Neuralgia and neuritis, unspecified: Secondary | ICD-10-CM

## 2016-11-16 DIAGNOSIS — E119 Type 2 diabetes mellitus without complications: Secondary | ICD-10-CM

## 2016-11-16 DIAGNOSIS — M79671 Pain in right foot: Secondary | ICD-10-CM

## 2016-11-16 DIAGNOSIS — M79672 Pain in left foot: Secondary | ICD-10-CM

## 2016-11-19 ENCOUNTER — Encounter: Payer: Self-pay | Admitting: Internal Medicine

## 2016-11-19 ENCOUNTER — Ambulatory Visit (INDEPENDENT_AMBULATORY_CARE_PROVIDER_SITE_OTHER): Payer: BC Managed Care – PPO | Admitting: Internal Medicine

## 2016-11-19 VITALS — BP 146/80 | HR 97 | Temp 98.2°F | Wt 297.0 lb

## 2016-11-19 DIAGNOSIS — E1165 Type 2 diabetes mellitus with hyperglycemia: Secondary | ICD-10-CM | POA: Diagnosis not present

## 2016-11-19 DIAGNOSIS — Z794 Long term (current) use of insulin: Secondary | ICD-10-CM | POA: Diagnosis not present

## 2016-11-19 LAB — POCT GLYCOSYLATED HEMOGLOBIN (HGB A1C): HEMOGLOBIN A1C: 12.6

## 2016-11-19 MED ORDER — SEMAGLUTIDE(0.25 OR 0.5MG/DOS) 2 MG/1.5ML ~~LOC~~ SOPN: 0.5000 mg | PEN_INJECTOR | SUBCUTANEOUS | 2 refills | Status: DC

## 2016-11-19 MED ORDER — GLUCOSE BLOOD VI STRP: ORAL_STRIP | 5 refills | Status: DC

## 2016-11-19 MED ORDER — FREESTYLE LIBRE SENSOR SYSTEM MISC: 1.0000 | 11 refills | Status: DC

## 2016-11-19 MED ORDER — FREESTYLE LIBRE READER DEVI: 1.0000 | Freq: Three times a day (TID) | 1 refills | Status: DC

## 2016-11-19 MED ORDER — INSULIN ASPART 100 UNIT/ML FLEXPEN: 25.0000 [IU] | PEN_INJECTOR | Freq: Three times a day (TID) | SUBCUTANEOUS | 11 refills | Status: DC

## 2016-11-19 MED ORDER — INSULIN DEGLUDEC 200 UNIT/ML ~~LOC~~ SOPN: 40.0000 [IU] | PEN_INJECTOR | Freq: Every day | SUBCUTANEOUS | 11 refills | Status: DC

## 2016-11-19 NOTE — Addendum Note (Signed)
Addended by: Tressie Ellis E on: 11/19/2016 04:41 PM   Modules accepted: Orders

## 2016-11-19 NOTE — Patient Instructions (Signed)
Please continue: - Metformin 1000 mg 2x a day  Increase: - Novolog to 25-30 units 15 min before meals - Tresiba to 40 units at bedtime  Start: - Ozempic 0.25 mg weekly for 4 weeks, then increase to 0.5 mg weekly.  Please return in 1.5 months with your sugar log.

## 2016-11-19 NOTE — Progress Notes (Signed)
Patient ID: Amy Rosario, female   DOB: 07/24/1960, 56 y.o.   MRN: [id number]   HPI: Amy Rosario is a 56 y.o.-year-old female, initially referred by her PCP, Dr. Glade Rosario, for management of DM2, dx in 2015-2016, insulin-dependent since dx, uncontrolled, without long term complications. Last OV 1.5 mo ago.  She is on Prednisone 10 mg for sarcoidosis since 08/2016, prev. 20 mg daily (started 06/2016).   Her sugars were exemplary in 06/2016 and started to increase in 07/2016, after she was started on Prednisone.   Last hemoglobin A1c was: Lab Results  Component Value Date   HGBA1C 12.9 (H) 08/29/2016   HGBA1C 8.5 02/03/2016   HGBA1C 10.8 (H) 10/12/2015   Pt was on a regimen of: - Novolog 70/30 25 units 2x a day - increased 10/04/2016. She was on Metformin 1000 mg 2x a day, with meals >> cannot remember why she was taken off the med.  We changed to: - Metformin 1000 mg of metformin 2x a day with breakfast and dinner. - Tresiba U200 36 units at bedtime - started 1 mo ago - Novolog: >> 25 units - started 1 mo ago Before a smaller meal: 15 units  Before a larger meal: 18   Pt checks her sugars 3-4x a day: - am: 326-525 >> 174-276 - 2h after b'fast: n/c >> 265-379, 387 - before lunch: n/c - 2h after lunch: n/c - before dinner: n/c >> 318-471 - 2h after dinner: n/c >> 335-455 - bedtime: n/c >> 306-396 - nighttime: n/c Lowest sugar was 265 >> 174. Highest sugar was 525 >> 471.  Glucometer: Amy Rosario >> Amy Rosario  Pt's meals are: - Breakfast: sausage, egg biscuits, hash brown,  - Lunch: chicken sandwich, mac and cheese - Dinner: steak and Gravy, green beans, corn, roll - Snacks: chips, 4 cookies  - no CKD, last BUN/creatinine:  Lab Results  Component Value Date   BUN 17 08/29/2016   BUN 23 03/26/2016   CREATININE 0.88 08/29/2016   CREATININE 1.51 (H) 03/26/2016  On Valsartan 320. - last set of lipids: Lab Results  Component Value Date   CHOL 194 08/29/2016    HDL 73 08/29/2016   LDLCALC 99 08/29/2016   TRIG 109 08/29/2016   CHOLHDL 2.7 08/29/2016  On Crestor 40. - last eye exam was in 09/2016 >> No DR.  + Floaters. - denies numbness and tingling in her feet.  She also has a history of goiter and thyroid nodules - the report of the ultrasound from 06/19/2013 was reviewed today: enlarged, heterogeneous, thyroid, with a 1.9 cm dominant complex nodule in the left lobe. Of note, this was biopsied in 2015 and the biopsy results was benign.   ROS: Constitutional: no weight gain/no weight loss, no fatigue, no subjective hyperthermia, no subjective hypothermia Eyes: no blurry vision, no xerophthalmia ENT: no sore throat, no nodules palpated in throat, no dysphagia, no odynophagia, no hoarseness Cardiovascular: no CP/no SOB/no palpitations/no leg swelling Respiratory: no cough/no SOB/no wheezing Gastrointestinal: no N/no V/no D/no C/no acid reflux Musculoskeletal: no muscle aches/no joint aches Skin: + rash, no hair loss Neurological: no tremors/no numbness/no tingling/no dizziness  I reviewed pt's medications, allergies, PMH, social hx, family hx, and changes were documented in the history of present illness. Otherwise, unchanged from my initial visit note.   Past Medical History:  Diagnosis Date  . Back pain   . Diabetes mellitus without complication (Amy Rosario)   . Goiter    left side, saw dr  Rosario april 2015 for, had biopsy done was told it is cyst  . H/O echocardiogram    Amy Ros[facility], [facility]  . Hyperlipidemia   . Hypertension 2000  . Impaired fasting blood sugar 2014  . Obesity   . Other screening mammogram    planned for 05/2013  . Routine gynecological examination    last pap 2012  . Sleep apnea    on CPAP setting of 2   Past Surgical History:  Procedure Laterality Date  . COLONOSCOPY WITH PROPOFOL N/A 09/21/2013   Procedure: COLONOSCOPY WITH PROPOFOL;  Surgeon: Amy Craver, MD;  Location: [facility];  Service: [facility];  Laterality: N/A;  . CYST EXCISION     right low back  . TUBAL LIGATION    . VIDEO BRONCHOSCOPY Bilateral 07/05/2016   Procedure: VIDEO BRONCHOSCOPY WITH FLUORO;  Surgeon: Amy Rockers, MD;  Location: [facility];  Service: [facility];  Laterality: Bilateral;   Social History   Social History  . Marital status: Single    Spouse name: N/A  . Number of children: 3   Occupational History   . Smoking status: Never Smoker  . Smokeless tobacco: Never Used  . Alcohol use No  . 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 [facility]    Current Outpatient Prescriptions on File Prior to Visit  Medication Sig Dispense Refill  . allopurinol (ZYLOPRIM) 100 MG tablet Take 1 tablet (100 mg total) by mouth daily. 30 tablet 2  . allopurinol (ZYLOPRIM) 100 MG tablet TAKE 1 TABLET BY MOUTH EVERY DAY 30 tablet 2  . Amlodipine-Valsartan-HCTZ (EXFORGE HCT) 10-320-25 MG TABS Take 1 tablet by mouth daily. 30 tablet 1  . aspirin 81 MG tablet Take 1 tablet (81 mg total) by mouth daily. 90 tablet 3  . atenolol (TENORMIN) 100 MG tablet TAKE 1 TABLET EVERY DAY 90 tablet 2  . bisoprolol (ZEBETA) 10 MG tablet Take 1 tablet (10 mg total) by mouth daily. 30 tablet 11  . Blood Glucose Monitoring Suppl (Amy Rosario 2) w/Device KIT 1 Device by Does not apply route 2 (two) times daily. 1 each 0  . cloNIDine (CATAPRES) 0.2 MG tablet TAKE 1 TABLET TWICE A DAY (Patient taking differently: TAKE 1 TABLET three  times A DAY) 180 tablet 2  . gabapentin (NEURONTIN) 100 MG capsule TAKE 1 CAPSULE (100 MG TOTAL) BY MOUTH AT BEDTIME. 30 capsule 2  . glucose blood (CONTOUR NEXT TEST) test strip Test twice daily  E11.9 100 each 12  . insulin aspart (NOVOLOG FLEXPEN) 100 UNIT/ML FlexPen Inject 15-25 Units into the skin 3 (three) times daily with meals. 15 mL 11  .  Insulin Degludec (TRESIBA FLEXTOUCH) 200 UNIT/ML SOPN Inject 36 Units into the skin at bedtime. 3 pen 2  . Insulin Pen Needle 32G X 4 MM MISC Use 4x a day 200 each 6  . meclizine (ANTIVERT) 25 MG tablet TAKE 1 TABLET (25 MG TOTAL) BY MOUTH 2 (TWO) TIMES DAILY. 30 tablet 0  . metFORMIN (GLUCOPHAGE) 1000 MG tablet Take 1 tablet (1,000 mg total) by mouth 2 (two) times daily with a meal. 180 tablet 3  . Amy TOUCH LANCETS MISC 100 pens by Does not apply route 2 (two) times daily. 100 each 3  . potassium chloride (KLOR-CON 10) 10 MEQ tablet Take 1 tablet (10 mEq total) by  mouth daily. 90 tablet 3  . predniSONE (DELTASONE) 10 MG tablet Take 1 tablet (10 mg total) by mouth daily with breakfast. (Patient taking differently: Take by mouth daily with breakfast. ) 60 tablet 1  . rosuvastatin (CRESTOR) 20 MG tablet TAKE 1 TABLET BY MOUTH EVERY DAY 90 tablet 2  . Vitamin D, Ergocalciferol, (DRISDOL) 50000 units CAPS capsule Take 1 capsule (50,000 Units total) by mouth every 7 (seven) days. 12 capsule 3   No current facility-administered medications on file prior to visit.    Allergies  Allergen Reactions  . Penicillins Hives and Swelling    Has patient had a PCN reaction causing immediate rash, facial/tongue/throat swelling, SOB or lightheadedness with hypotension: Yes Has patient had a PCN reaction causing severe rash involving mucus membranes or skin necrosis: No Has patient had a PCN reaction that required hospitalization: No Has patient had a PCN reaction occurring within the last 10 years: No If all of the above answers are "NO", then may proceed with Cephalosporin use.   Family History  Problem Relation Age of Onset  . Heart disease Father   . Hypertension Father   . Heart disease Brother   . Cancer Neg Hx   . Stroke Neg Hx   . Diabetes Neg Hx    PE: BP (!) 146/80   Pulse 97   Temp 98.2 F (36.8 C) (Oral)   Wt 297 lb (134.7 kg)   LMP 11/09/2013   SpO2 98%   BMI 43.86 kg/m  Wt Readings  from Last 3 Encounters:  11/19/16 297 lb (134.7 kg)  10/05/16 286 lb 3.2 oz (129.8 kg)  10/04/16 287 lb 9.6 oz (130.5 kg)   Constitutional: obese, in NAD Eyes: PERRLA, EOMI, no exophthalmos ENT: moist mucous membranes, no thyromegaly, no cervical lymphadenopathy Cardiovascular: tachycardia, RR, No MRG Respiratory: CTA B Gastrointestinal: abdomen soft, NT, ND, BS+ Musculoskeletal: no deformities, strength intact in all 4 Skin: moist, warm, no rashes Neurological: no tremor with outstretched hands, DTR normal in all 4   ASSESSMENT: 1. DM2, insulin-dependent, uncontrolled, without  long-term complications, but with significant hyperglycemia   PLAN:  1. Patient with long-standing, uncontrolled DM2, after starting Prednisone for Sarcoidoses. At last visit, she was on premixed insulin >> we changed to basal-bolus insulin regimen and also added Metformin. She is tolerating this well. She could only obtain the new insulins ~1 mo ago. Sugars have improved especially in am, but they are still very high: from 300-500s >> 200-400s - we discussed about increasing her mealtime insulin and Tresiba a little, but I would also suggest to try Ozempic which has a powerful action on postprandial sugars >> she agrees to try  - discussed mechanism of action, benefits and possible SEs. Also discussed dose titration. Given coupon card. - I suggested to:  Patient Instructions  Please continue: - Metformin 1000 mg 2x a day  Increase: - Novolog to 25-30 units 15 min before meals - Tresiba to 40 units at bedtime  Start: - Ozempic 0.25 mg weekly for 4 weeks, then increase to 0.5 mg weekly.  Please return in 1.5 months with your sugar log.   - today, HbA1c is 12.6% (still very high) - continue checking sugars at different times of the day - check 3x a day, rotating checks - advised for yearly eye exams >> she is UTD - she is UTD with flu shot - Return to clinic in 1.5 mo with sugar log   - time spent with  the patient:  25 min, of which >50% was spent in reviewing her sugar log (she kepts great records of her sugars), discussing her hyper-glycemic episodes, reviewing  previous labs and insulin doses and developing a plan to improve her diabetes control while avoiding hypo- and hyper-glycemia.   Philemon Kingdom, MD PhD [facility]

## 2016-12-03 ENCOUNTER — Ambulatory Visit (INDEPENDENT_AMBULATORY_CARE_PROVIDER_SITE_OTHER): Payer: BC Managed Care – PPO | Admitting: Internal Medicine

## 2016-12-03 ENCOUNTER — Encounter: Payer: Self-pay | Admitting: Internal Medicine

## 2016-12-03 VITALS — BP 130/78 | HR 77 | Ht 69.0 in | Wt 302.2 lb

## 2016-12-03 DIAGNOSIS — D869 Sarcoidosis, unspecified: Secondary | ICD-10-CM

## 2016-12-03 DIAGNOSIS — L0103 Bullous impetigo: Secondary | ICD-10-CM

## 2016-12-03 MED ORDER — DOXYCYCLINE HYCLATE 100 MG PO TABS: 100.0000 mg | ORAL_TABLET | Freq: Two times a day (BID) | ORAL | 0 refills | Status: DC

## 2016-12-03 NOTE — Progress Notes (Signed)
Subjective:     Patient ID: Amy Rosario, female   DOB: 1960-09-20     MRN: [id number]    Brief patient profile:  31 yobf with MO never smoker  Presented sob/abn lfts Feb 2018 > ? dx Sarcoid > referred to pulmonary clinic 04/18/2016 by Elray Mcgregor PA  - FOB 07/05/16> severe diffuse cobblestoning :  Endobronchial bx:  NCG     History of Present Illness  04/18/2016 1st [facility] office visit/ Wert   Chief Complaint  Patient presents with  . Pulmonary Consult    Referred by Dr. Chana Bode for eval of Sarcoidosis. Pt states she was just dxed recently. She started having SOB in mid Feb 2018.  She gets winded walking short distances such as to her mailbox and back.  She also c/o cough- prod at times with minimal yellow to clear sputum. She states she has recently noticed bumps on her face.   onset of sob was indolent really since first of 2018 assoc with abd swelling and  mostly dry cough then noted "red bumps" on face and some aching of elbows but not ankles or wrists. No viz symptoms/ some anorexia but min wt loss  Doe = MMRC1 = can walk nl pace, flat grade, can't hurry or go uphills or steps s sob   rec Sarcoidosis is a benign inflammatory condition  Treatment is generally reserved for patients with major symptoms  schedule dermatology for skin biopsy> neg for sarcoid       06/04/2016  f/u ov/Wert re:  Sarcoidosis  Chief Complaint  Patient presents with  . Follow-up    4 wk f/u for sarcoidosis.   new problem = stiffness and swelling in hands / had skin bx but doesn't know her doctor's name that treats this "it's gout" No change doe = mmrc 1  rec Prednisone 10 mg take  4 each am x 2 days,   2 each am x 2 days,  1 each am x 2 days and stop  Please remember to go to the lab  department downstairs in the basement  for your tests - we will call you with the results when they are available. Stop tenormin and start bisoprolol  10 mg daily    07/03/2016  f/u ov/Wert re:  sarcoidosis  Chief Complaint  Patient presents with  . Follow-up    Pt c/o stiffness in her neck in the am's.  She also c/o swelling in both hands.   much better on pred, much worse off in terms of all her arthritis symptoms esp L hand / wrist  Doe continues at level = MMRC1 rec - FOB 07/05/16> severe diffuse cobblestoning :  Endobronchial bx:  NCG rec pred 20 mg daily until better then 10 mg daily and f/u in one month   08/17/2016  f/u ov/Wert re:  Sarcoid with prominent airway involvement on pred 10 mg x one week s flare  Chief Complaint  Patient presents with  . Follow-up    Arthritic pain improved on pred. Breathing is doing well and no new co's today.    main symptom arthritis elbows/ hands resolved/  Not limited by breathing from desired activities  But not very active rec Take zebeta (10 mg) each am and continue clonidine 0.2 three times day  Prednisone ceiling of 20 mg per day and floor of 5 mg with breakfast          09/28/2016  f/u ov/Wert re: sarcoid / still on  pred 10 mg daily/ did not try to taper ? Why  Chief Complaint  Patient presents with  . Follow-up    Breathing is doing well. No new co's today.   Not limited by breathing from desired activities  / no arthritis/ cough  Has not seen eye doctor  rec Please remember to go to the lab department downstairs in the basement  for your tests - we will call you with the results when they are available. If breathing or coughing worse, pred should be 20 mg per day otherwise taper to  5 mg daily  Patient coordinator   to schedule opthamology eval due to your sarcoidosis - we will be in touch     12/03/2016  f/u ov/Wert re: sarcoid / pred at 10 mg daily  Chief Complaint  Patient presents with  . Follow-up    Pt c/o breaking out in bumps all over her body. Denies any SOB, cough, or CP.  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 No unusual exp hx or h/o childhood pna/ asthma or knowledge of premature birth.  Sleeping ok flat without nocturnal  or early am exacerbation  of respiratory  c/o's or need for noct saba. Also denies any obvious fluctuation of symptoms with weather or environmental changes or other aggravating or alleviating factors except as outlined above   Current Allergies, Complete Past Medical History, Past Surgical History, Family History, and Social History were reviewed in [employer] record.  ROS  The following are not active complaints unless bolded Hoarseness, sore throat, dysphagia, dental problems, itching, sneezing,  nasal congestion or discharge of excess mucus or purulent secretions, ear ache,   fever, chills, sweats, unintended wt loss or wt gain, classically pleuritic or exertional cp,  orthopnea pnd or leg swelling, presyncope, palpitations, abdominal pain, anorexia, nausea, vomiting, diarrhea  or change in bowel habits or change in bladder habits, change in stools or change in urine, dysuria, hematuria,  rash, arthralgias, visual complaints, headache, numbness, weakness or ataxia or problems with walking or coordination,  change in mood/affect or memory.        Current Meds  Medication Sig  . allopurinol (ZYLOPRIM) 100 MG tablet Take 1 tablet (100 mg total) by mouth daily.  . Amlodipine-Valsartan-HCTZ (EXFORGE HCT) 10-320-25 MG TABS Take 1 tablet by mouth daily.  Marland Kitchen aspirin 81 MG tablet Take 1 tablet (81 mg total) by mouth daily.  Marland Kitchen atenolol (TENORMIN) 100 MG tablet TAKE 1 TABLET EVERY DAY  . bisoprolol (ZEBETA) 10 MG tablet Take 1 tablet (10 mg total) by mouth  daily.  . Blood Glucose Monitoring Suppl (ONE TOUCH ULTRA 2) w/Device KIT 1 Device by Does not apply route 2 (two) times daily.  . cloNIDine (CATAPRES) 0.2 MG tablet TAKE 1 TABLET TWICE A DAY (Patient taking differently: TAKE 1 TABLET three  times A DAY)  . Continuous Blood Gluc Receiver (FREESTYLE LIBRE READER) DEVI 1 Device by Does not apply route 3 (three) times daily.  . Continuous Blood Gluc Sensor (FREESTYLE LIBRE SENSOR SYSTEM) MISC  1 Device by Does not apply route every 30 (thirty) days.  Marland Kitchen gabapentin (NEURONTIN) 100 MG capsule TAKE 1 CAPSULE (100 MG TOTAL) BY MOUTH AT BEDTIME.  Marland Kitchen glucose blood test strip Use as instructed 4x a day  . insulin aspart (NOVOLOG FLEXPEN) 100 UNIT/ML FlexPen Inject 25-30 Units into the skin 3 (three) times daily before meals.  . Insulin Degludec (TRESIBA FLEXTOUCH) 200 UNIT/ML SOPN Inject 40 Units into the skin at bedtime.  . Insulin Pen Needle 32G X 4 MM MISC Use 4x a day  . meclizine (ANTIVERT) 25 MG tablet TAKE 1 TABLET (25 MG TOTAL) BY MOUTH 2 (TWO) TIMES DAILY.  . metFORMIN (GLUCOPHAGE) 1000 MG tablet Take 1 tablet (1,000 mg total) by mouth 2 (two) times daily with a meal.  . ONE TOUCH LANCETS MISC 100 pens by Does not apply route 2 (two) times daily.  . potassium chloride (KLOR-CON 10) 10 MEQ tablet Take 1 tablet (10 mEq total) by mouth daily.  . predniSONE (DELTASONE) 10 MG tablet Take 1 tablet (10 mg total) by mouth daily with breakfast. (Patient taking differently: Take by mouth daily with breakfast. )  . rosuvastatin (CRESTOR) 20 MG tablet TAKE 1 TABLET BY MOUTH EVERY DAY  . Semaglutide (OZEMPIC) 0.25 or 0.5 MG/DOSE SOPN Inject 0.5 mg into the skin once a week.  . Vitamin D, Ergocalciferol, (DRISDOL) 50000 units CAPS capsule Take 1 capsule (50,000 Units total) by mouth every 7 (seven) days.                       Objective:   Physical Exam    amb obese bf nad    12/03/2016      302  09/28/2016        290  08/17/2016        284   07/03/2016        276   06/04/2016       267   04/18/16 263 lb (119.3 kg)  03/26/16 264 lb 6.4 oz (119.9 kg)  03/20/16 263 lb (119.3 kg)   9/6/ 2017        295    Vital signs reviewed   - Note on arrival 02 sats  98% on RA     HEENT: nl  turbinates bilaterally, and oropharynx. Nl external ear canals without cough reflex - poor dentitions    NECK :  without JVD/Nodes/TM/ nl carotid upstrokes bilaterally   LUNGS: no acc muscle use,  Nl contour chest which is clear to A and P bilaterally without cough on insp or exp maneuvers   CV:  RRR  no s3 or murmur or increase in P2, and no edema   ABD:  massivley obese but soft and nontender with nl inspiratory excursion in the supine position. No bruits or organomegaly appreciated, bowel sounds nl  MS:  Nl gait/ ext warm without deformities, calf tenderness, cyanosis or clubbing No obvious joint restrictions    SKIN: warm and dry  With multiple quarter sized red indurated cystic lesions R forearm and trunk, som with ? Early pointing but no discharge  NEURO:  alert, approp, nl sensorium with  no motor or cerebellar deficits apparent.          Lab Results  Component Value Date   ESRSEDRATE 35 (H) 09/28/2016   ESRSEDRATE 119 (H) 06/04/2016   ESRSEDRATE 90 (H) 04/18/2016          Assessment:

## 2016-12-03 NOTE — Patient Instructions (Addendum)
Doxycycline 100mg  twice daily x 10 days and follow with your dermatologist = Amy Rosario at [address]    Try prednisone 5 mg daily and see what difference it makes with cough or short of breath - ceiling is 20 mg daily and floor is 5 mg daily    Please schedule a follow up office visit in 6 weeks, call sooner if needed

## 2016-12-04 ENCOUNTER — Encounter: Payer: Self-pay | Admitting: Internal Medicine

## 2016-12-04 DIAGNOSIS — L0103 Bullous impetigo: Secondary | ICD-10-CM | POA: Insufficient documentation

## 2016-12-04 NOTE — Assessment & Plan Note (Signed)
Body mass index is 44.63 kg/m.  -  trending up Lab Results  Component Value Date   TSH 3.60 03/08/2016     Contributing to gerd risk/ doe/reviewed the need and the process to achieve and maintain neg calorie balance > defer f/u primary care including intermittently monitoring thyroid status

## 2016-12-04 NOTE — Assessment & Plan Note (Signed)
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 - ESR 06/04/2016  119 >  Prednisone 10 mg take  4 each am x 2 days,   2 each am x 2 days,  1 each am x 2 days and stop  - PFT's  06/04/2016  FVC  2.46 (73%)  No obst and no improvement from saba p nothing  prior to study with DLCO  64/63 % corrects to 105  % for alv volume   - ACE level 06/04/2016 = 260  - FOB 07/05/16> severe diffuse cobblestoning :  Endobronchial bx:  NCG rec pred 20 mg daily until better then 10 mg daily   - 08/17/2016 improved so taper to 5 mg daily as floor  - ESR 09/28/2016 down to 35/angiotensin 94  on 10 mg daily s symptoms> rec ceiling 20/ floor 5 mg daily  - referred to ophthalmology 09/28/2016 > bullous impetigo rx with doxy (see sep a/p)   No evidence of active sarcoid and gaining wt on pred so rec try taper to 5 mg qod, using the lowest dose that controls the main symptoms = cough/ sob absent at 10 mg per day  I had an extended discussion with the patient reviewing all relevant studies completed to date and  lasting 15 to 20 minutes of a 25 minute visit    Each maintenance medication was reviewed in detail including most importantly the difference between maintenance and prns and under what circumstances the prns are to be triggered using an action plan format that is not reflected in the computer generated alphabetically organized AVS.    Please see AVS for specific instructions unique to this visit that I personally wrote and verbalized to the the pt in detail and then reviewed with pt  by my nurse highlighting any  changes in therapy recommended at today's visit to their plan of care.

## 2016-12-04 NOTE — Assessment & Plan Note (Signed)
rx doxy 09/28/16 by derm Flare mid Oct 2018 >  doxy x 10 days by Kayo Zion > referred back to Dermatology

## 2016-12-15 ENCOUNTER — Other Ambulatory Visit: Payer: Self-pay | Admitting: Internal Medicine

## 2016-12-15 ENCOUNTER — Other Ambulatory Visit: Payer: Self-pay | Admitting: Medical

## 2016-12-18 ENCOUNTER — Other Ambulatory Visit: Payer: Self-pay

## 2016-12-18 ENCOUNTER — Telehealth: Payer: Self-pay | Admitting: Family Medicine

## 2016-12-18 NOTE — Telephone Encounter (Signed)
pls call in, tests QID, #120 on strips/lancets, 11 refills

## 2016-12-18 NOTE — Telephone Encounter (Signed)
Pt called back and said that she didn't request any from [facility] she doesn't know anything about this. This is scam.

## 2016-12-18 NOTE — Telephone Encounter (Signed)
Called and l/m for pt call us back to make sure this is real , and not  Scam.

## 2016-12-18 NOTE — Telephone Encounter (Signed)
Kenney is requesting refill of Blood Glucose Monitor, Lancing Device, Alcohol Swabs, Test Strips, Control solution, Lancets, battery, Pen needles

## 2016-12-25 ENCOUNTER — Other Ambulatory Visit: Payer: Self-pay | Admitting: Medical

## 2016-12-25 ENCOUNTER — Telehealth: Payer: Self-pay | Admitting: Medical

## 2016-12-25 NOTE — Telephone Encounter (Signed)
If Dr. Melvyn Novas changed her to Bisoprolol, then she should have stopped Atenolol.   Thus, c/t Bisoprolol.  I'll refill Bisoprolol in computer and make sure she has refill

## 2016-12-25 NOTE — Telephone Encounter (Signed)
Pt states [facility] states she should not be taking the Atenolol and Bisoprolol both and said she kept Atenolol and she is now out of these pills.  She wants to know what she should do? Dr. Melvyn Novas prescribes the Bisoprolol.  Please call pt and let her know

## 2016-12-25 NOTE — Telephone Encounter (Signed)
Called pharmacy & informed, but pharmacist advised that Bisoprolol is on back order now and not sure when it will be back in stock.  Per Dr. Redmond School ok to switch back to Atenolol if needed until Bisoprolol comes in .  I called pt and informed, she has Bisoprolol left and will finish those and then see if pharmacy has gotten in the back order, if not she will get the Atenolol.  She understands she is not to take both the Atenolol and Bisoprolol.

## 2017-01-08 ENCOUNTER — Ambulatory Visit: Payer: BC Managed Care – PPO | Admitting: Medical

## 2017-01-08 ENCOUNTER — Encounter: Payer: Self-pay | Admitting: Medical

## 2017-01-08 VITALS — BP 122/84 | HR 84 | Wt 308.0 lb

## 2017-01-08 DIAGNOSIS — Z794 Long term (current) use of insulin: Secondary | ICD-10-CM

## 2017-01-08 DIAGNOSIS — L602 Onychogryphosis: Secondary | ICD-10-CM | POA: Diagnosis not present

## 2017-01-08 DIAGNOSIS — E1165 Type 2 diabetes mellitus with hyperglycemia: Secondary | ICD-10-CM

## 2017-01-08 DIAGNOSIS — E785 Hyperlipidemia, unspecified: Secondary | ICD-10-CM

## 2017-01-08 DIAGNOSIS — I10 Essential (primary) hypertension: Secondary | ICD-10-CM | POA: Diagnosis not present

## 2017-01-08 DIAGNOSIS — N289 Disorder of kidney and ureter, unspecified: Secondary | ICD-10-CM | POA: Diagnosis not present

## 2017-01-08 MED ORDER — GLUCOSE BLOOD VI STRP
ORAL_STRIP | 11 refills | Status: DC
Start: 2017-01-08 — End: 2017-10-25

## 2017-01-08 NOTE — Patient Instructions (Signed)
Review your medication list when you get home to compare with the pill bottles you have at home to make sure everything looks correct  I recommend you have a shingles vaccine to help prevent shingles or herpes zoster outbreak.   Please call your insurer to inquire about coverage for the Shingrix vaccine given in 2 doses.   Some insurers cover this vaccine after age 54, some cover this after age 29.  If your insurer covers this, then call to schedule appointment to have this vaccine here.  Ask Dr. Cruzita Lederer to check a BMET lab for kidney marker when you go for appointment next week.

## 2017-01-08 NOTE — Progress Notes (Signed)
Subjective: Chief Complaint  Patient presents with  . Follow-up    follow up  3 month follow up    Here for med check from 09/2016 visit.  At her last visit in August, we referred to podiatry for nail and foot care.  She has not heard back on this appt.  Since last visit she has seen Dr. Melvyn Novas for follow-up on sarcoidosis.  She had a call back as Dr. Melvyn Novas want her to change to bisoprolol, however that was on back order, and was on atenolol for the time being.  However, since last visit is back Bisoprolol.  Since last visit she saw Dr. Cruzita Lederer endocrinology for diabetes. We had referred her for uncontrolled diabetes.  At that visit she was continued on metformin 1000 mg twice daily, her insulin was changed to 25-30 units of NovoLog before meals, and tresiba 40 units at bedtime.  She was started on Ozempic injection.  She was told to return in a month and a half, so she is due now.  Sees her on 01/15/2017.  She notes glucose numbers looking better.   Last visit I counseled on shingles vaccine.  She forgot to check on this.  Last visit we discussed her lipid profile which is not at goal.   We discussed possibly adding Repatha since she is already on high-dose statin not at goal.   Compliant with CPAP for sleep apnea.  exercise - walking some.  Past Medical History:  Diagnosis Date  . Back pain   . Diabetes mellitus without complication ([place])   . Goiter    left side, saw dr tysinger april 2015 for, had biopsy done was told it is cyst  . H/O echocardiogram    Dr. Charolette Forward, [facility]  . Hyperlipidemia   . Hypertension 2000  . Impaired fasting blood sugar 2014  . Obesity   . Other screening mammogram    planned for 05/2013  . Routine gynecological examination    last pap 2012  . Sleep apnea    on CPAP setting of 2   Current Outpatient Medications on File Prior to Visit  Medication Sig Dispense Refill  . allopurinol (ZYLOPRIM) 100 MG tablet Take 1 tablet  (100 mg total) by mouth daily. 30 tablet 2  . Amlodipine-Valsartan-HCTZ (EXFORGE HCT) 10-320-25 MG TABS Take 1 tablet by mouth daily. 30 tablet 1  . aspirin 81 MG tablet Take 1 tablet (81 mg total) by mouth daily. 90 tablet 3  . bisoprolol (ZEBETA) 10 MG tablet Take 1 tablet (10 mg total) by mouth daily. 30 tablet 11  . Blood Glucose Monitoring Suppl (ONE TOUCH ULTRA 2) w/Device KIT 1 Device by Does not apply route 2 (two) times daily. 1 each 0  . cloNIDine (CATAPRES) 0.2 MG tablet TAKE 1 TABLET TWICE A DAY (Patient taking differently: TAKE 1 TABLET three  times A DAY) 180 tablet 2  . gabapentin (NEURONTIN) 100 MG capsule TAKE 1 CAPSULE (100 MG TOTAL) BY MOUTH AT BEDTIME. 30 capsule 2  . insulin aspart (NOVOLOG FLEXPEN) 100 UNIT/ML FlexPen Inject 25-30 Units into the skin 3 (three) times daily before meals. 30 mL 11  . Insulin Degludec (TRESIBA FLEXTOUCH) 200 UNIT/ML SOPN Inject 40 Units into the skin at bedtime. 6 pen 11  . Insulin Pen Needle 32G X 4 MM MISC Use 4x a day 200 each 6  . meclizine (ANTIVERT) 25 MG tablet TAKE 1 TABLET (25 MG TOTAL) BY MOUTH 2 (TWO) TIMES DAILY. 30 tablet  0  . metFORMIN (GLUCOPHAGE) 1000 MG tablet Take 1 tablet (1,000 mg total) by mouth 2 (two) times daily with a meal. 180 tablet 3  . ONE TOUCH LANCETS MISC 100 pens by Does not apply route 2 (two) times daily. 100 each 3  . potassium chloride (KLOR-CON 10) 10 MEQ tablet Take 1 tablet (10 mEq total) by mouth daily. 90 tablet 3  . predniSONE (DELTASONE) 10 MG tablet TAKE 2 TABLETS DAILY UNTIL BETTER,THEN START 1 DAILY 60 tablet 1  . rosuvastatin (CRESTOR) 20 MG tablet TAKE 1 TABLET BY MOUTH EVERY DAY 90 tablet 2  . Semaglutide (OZEMPIC) 0.25 or 0.5 MG/DOSE SOPN Inject 0.5 mg into the skin once a week. 2 pen 2  . Vitamin D, Ergocalciferol, (DRISDOL) 50000 units CAPS capsule Take 1 capsule (50,000 Units total) by mouth every 7 (seven) days. 12 capsule 3   No current facility-administered medications on file prior to  visit.    ROS as in subjective     Objective: BP 122/84   Pulse 84   Wt (!) 308 lb (139.7 kg)   LMP 11/09/2013   SpO2 98%   BMI 45.48 kg/m   Wt Readings from Last 3 Encounters:  01/08/17 (!) 308 lb (139.7 kg)  12/03/16 (!) 302 lb 3.2 oz (137.1 kg)  11/19/16 297 lb (134.7 kg)   Gen: wd, wn ,nad Otherwise not examined    Assessment: Encounter Diagnoses  Name Primary?  . Type 2 diabetes mellitus with hyperglycemia, with long-term current use of insulin ([place]) Yes  . Essential hypertension, benign   . Renal insufficiency   . Nail hypertrophy   . Hyperlipidemia, unspecified hyperlipidemia type       Plan: Our primary focus today was medicine reconciliation and to make sure she was on the right track with her regimen  We reviewed her med list, last visit notes, I reviewed her recent pulmonology and endocrinology notes.  She has made the changes recommended by endocrinology and her blood sugars are gradually coming down from the 300 and 400 range into the high 100-200 range.  She seems to be compliant with all of her medications today and we reviewed all medications to make sure we are all on the same page.  She has follow-up next week with endocrinology.  I will defer labs as she will likely have a hemoglobin A1c and BMET lab at that [facility]  I reviewed her labs from July.  I recommended again that she check insurance coverage for shingles vaccine.  I also recommend she consider doing monthly bubble wrap medicine pack from a local pharmacy such as [facility] to help reduce error and to help with the complexly of taking so many medications.  I will plan on seeing her back in the spring.

## 2017-01-15 ENCOUNTER — Ambulatory Visit: Payer: BC Managed Care – PPO | Admitting: Internal Medicine

## 2017-01-19 ENCOUNTER — Other Ambulatory Visit: Payer: Self-pay | Admitting: Internal Medicine

## 2017-01-21 ENCOUNTER — Other Ambulatory Visit: Payer: Self-pay | Admitting: Medical

## 2017-01-21 ENCOUNTER — Encounter: Payer: Self-pay | Admitting: Internal Medicine

## 2017-01-21 ENCOUNTER — Ambulatory Visit: Payer: BC Managed Care – PPO | Admitting: Internal Medicine

## 2017-01-21 VITALS — BP 132/84 | HR 104 | Ht 67.0 in | Wt 312.0 lb

## 2017-01-21 DIAGNOSIS — L0103 Bullous impetigo: Secondary | ICD-10-CM | POA: Diagnosis not present

## 2017-01-21 DIAGNOSIS — D869 Sarcoidosis, unspecified: Secondary | ICD-10-CM | POA: Diagnosis not present

## 2017-01-21 MED ORDER — PREDNISONE 5 MG (21) PO TBPK: ORAL_TABLET | ORAL | 0 refills | Status: DC

## 2017-01-21 MED ORDER — DOXYCYCLINE HYCLATE 100 MG PO TABS: 100.0000 mg | ORAL_TABLET | Freq: Two times a day (BID) | ORAL | 0 refills | Status: DC

## 2017-01-21 NOTE — Assessment & Plan Note (Signed)
Body mass index is 48.87 kg/m.  -  trending up  Lab Results  Component Value Date   TSH 3.60 03/08/2016     Contributing to gerd risk/ doe/reviewed the need and the process to achieve and maintain neg calorie balance > defer f/u primary care including intermittently monitoring thyroid status

## 2017-01-21 NOTE — Patient Instructions (Addendum)
Prednisone ceiling is 20 mg per day, floor is 5 mg daily and if you do great on the 5 mg daily x for a month, try taking on even days  Doxy 100 mg twice daily x 10 days and follow up with your dermatologist  Please schedule a follow up visit in 3 months but call sooner if needed with cxr and pfts

## 2017-01-21 NOTE — Assessment & Plan Note (Signed)
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 - ESR 06/04/2016  119 >  Prednisone 10 mg take  4 each am x 2 days,   2 each am x 2 days,  1 each am x 2 days and stop  - PFT's  06/04/2016  FVC  2.46 (73%)  No obst and no improvement from saba p nothing  prior to study with DLCO  64/63 % corrects to 105  % for alv volume   - ACE level 06/04/2016 = 260  - FOB 07/05/16> severe diffuse cobblestoning :  Endobronchial bx:  NCG rec pred 20 mg daily until better then 10 mg daily   - 08/17/2016 improved so taper to 5 mg daily as floor  - ESR 09/28/2016 down to 35/angiotensin 94  on 10 mg daily s symptoms> rec ceiling 20/ floor 5 mg daily  - referred to ophthalmology 09/28/2016 > neg     No evidence of active dz. The goal with a chronic steroid dependent illness is always arriving at the lowest effective dose that controls the disease/symptoms and not accepting a set "formula" which is based on statistics or guidelines that don't always take into account patient  variability or the natural hx of the dz in every individual patient, which may well vary over time.  For now therefore I recommend the patient maintain  Ceiling of 20 mg daily using sob/ cough/ arthralgias as clinical indicators and try floor of 5 mg qod  > f/u at 40m sooner prn   I had an extended discussion with the patient reviewing all relevant studies completed to date and  lasting 15 to 20 minutes of a 25 minute visit    Each maintenance medication was reviewed in detail including most importantly the difference between maintenance and prns and under what circumstances the prns are to be triggered using an action plan format that is not reflected in the computer generated alphabetically organized AVS.    Please see AVS for specific instructions unique to this visit that I personally wrote and verbalized to the the pt in detail and then reviewed with  pt  by my nurse highlighting any  changes in therapy recommended at today's visit to their plan of care.

## 2017-01-21 NOTE — Progress Notes (Signed)
Subjective:     Patient ID: Amy Rosario, female   DOB: 06/26/1960     MRN: [id number]    Brief patient profile:  56 yobf with MO never smoker  Presented sob/abn lfts Feb 2018 > ? dx Sarcoid > referred to pulmonary clinic 04/18/2016 by Sean Tysinger PA  - FOB 07/05/16> severe diffuse cobblestoning :  Endobronchial bx:  NCG    Last worked in Mar 25 2016   History of Present Illness  04/18/2016 1st [facility] office visit/ Amy Rosario   Chief Complaint  Patient presents with  . Pulmonary Consult    Referred by Dr. David Tysinger for eval of Sarcoidosis. Pt states she was just dxed recently. She started having SOB in mid Feb 2018.  She gets winded walking short distances such as to her mailbox and back.  She also c/o cough- prod at times with minimal yellow to clear sputum. She states she has recently noticed bumps on her face.   onset of sob was indolent really since first of 2018 assoc with abd swelling and  mostly dry cough then noted "red bumps" on face and some aching of elbows but not ankles or wrists. No viz symptoms/ some anorexia but min wt loss  Doe = MMRC1 = can walk nl pace, flat grade, can't hurry or go uphills or steps s sob   rec Sarcoidosis is a benign inflammatory condition  Treatment is generally reserved for patients with major symptoms  schedule dermatology for skin biopsy> neg for sarcoid       06/04/2016  f/u ov/Amy Rosario re:  Sarcoidosis  Chief Complaint  Patient presents with  . Follow-up    4 wk f/u for sarcoidosis.   new problem = stiffness and swelling in hands / had skin bx but doesn't know her doctor's name that treats this "it's gout" No change doe = mmrc 1  rec Prednisone 10 mg take  4 each am x 2 days,   2 each am x 2 days,  1 each am x 2 days and stop  Please remember to go to the lab  department downstairs in the basement  for your tests - we will call you with the results when they are available. Stop tenormin and start bisoprolol  10 mg daily     07/03/2016  f/u ov/Amy Rosario re: sarcoidosis  Chief Complaint  Patient presents with  . Follow-up    Pt c/o stiffness in her neck in the am's.  She also c/o swelling in both hands.   much better on pred, much worse off in terms of all her arthritis symptoms esp L hand / wrist  Doe continues at level = MMRC1 rec - FOB 07/05/16> severe diffuse cobblestoning :  Endobronchial bx:  NCG rec pred 20 mg daily until better then 10 mg daily and f/u in one month   08/17/2016  f/u ov/Amy Rosario re:  Sarcoid with prominent airway involvement on pred 10 mg x one week s flare  Chief Complaint  Patient presents with  . Follow-up    Arthritic pain improved on pred. Breathing is doing well and no new co's today.    main symptom arthritis elbows/ hands resolved/  Not limited by breathing from desired activities  But not very active rec Take zebeta (10 mg) each am and continue clonidine 0.2 three times day  Prednisone ceiling of 20 mg per day and floor of 5 mg with breakfast          09/28/2016    f/u ov/Amy Rosario re: sarcoid / still on pred 10 mg daily/ did not try to taper ? Why  Chief Complaint  Patient presents with  . Follow-up    Breathing is doing well. No new co's today.   Not limited by breathing from desired activities  / no arthritis/ cough  Has not seen eye doctor  rec Please remember to go to the lab department downstairs in the basement  for your tests - we will call you with the results when they are available. If breathing or coughing worse, pred should be 20 mg per day otherwise taper to  5 mg daily  Patient coordinator   to schedule opthamology eval due to your sarcoidosis - we will be in touch     12/03/2016  f/u ov/Amy Rosario re: sarcoid / pred at 10 mg daily  Chief Complaint  Patient presents with  . Follow-up    Pt c/o breaking out in bumps all over her body. Denies any SOB, cough, or CP.  bumps started back 3 weeks prior to OV  , not itchy, very similar to previous lesion dx as bullous  impetigoneg for sarcoid per dermatology/ has not been back to Dr Delman Cheadle or taken any of the meds she prev used for the other cyst(included doxy) rec Doxycycline '100mg'$  twice daily x 10 days and follow with your dermatologist = Jari Pigg at [address]  Try prednisone 5 mg daily and see what difference it makes with cough or short of breath - ceiling is 20 mg daily and floor is 5 mg daily    01/21/2017  f/u ov/Amy Rosario re: sarcoid  On pred at 10 mg daily - did not taper  Chief Complaint  Patient presents with  . Follow-up    Bumps resolved on doxy and then returned once she was completed course. She denies any new co's.   breathing is good, no cough/ joint stiffness  Has not seen derm, prior rx for doxy helped then recurred several weeks p completed course  No obvious day to day or daytime variability or assoc excess/ purulent sputum or mucus plugs or hemoptysis or cp or chest tightness, subjective wheeze or overt sinus or hb symptoms. No unusual exposure hx or h/o childhood pna/ asthma or knowledge of premature birth.  Sleeping ok flat without nocturnal  or early am exacerbation  of respiratory  c/o's or need for noct saba. Also denies any obvious fluctuation of symptoms with weather or environmental changes or other aggravating or alleviating factors except as outlined above   Current Allergies, Complete Past Medical History, Past Surgical History, Family History, and Social History were reviewed in [employer] record.  ROS  The following are not active complaints unless bolded Hoarseness, sore throat, dysphagia, dental problems, itching, sneezing,  nasal congestion or discharge of excess mucus or purulent secretions, ear ache,   fever, chills, sweats, unintended wt loss or wt gain, classically pleuritic or exertional cp,  orthopnea pnd or leg swelling, presyncope, palpitations, abdominal pain, anorexia, nausea, vomiting, diarrhea  or change in bowel habits or change in  bladder habits, change in stools or change in urine, dysuria, hematuria,  rash, arthralgias, visual complaints, headache, numbness, weakness or ataxia or problems with walking or coordination,  change in mood/affect or memory.        Current Meds  Medication Sig  . allopurinol (ZYLOPRIM) 100 MG tablet Take 1 tablet (100 mg total) by mouth daily.  . Amlodipine-Valsartan-HCTZ (EXFORGE HCT) 10-320-25 MG TABS  Take 1 tablet by mouth daily.  Marland Kitchen aspirin 81 MG tablet Take 1 tablet (81 mg total) by mouth daily.  . bisoprolol (ZEBETA) 10 MG tablet Take 1 tablet (10 mg total) by mouth daily.  . Blood Glucose Monitoring Suppl (ONE TOUCH ULTRA 2) w/Device KIT 1 Device by Does not apply route 2 (two) times daily.  . cloNIDine (CATAPRES) 0.2 MG tablet TAKE 1 TABLET TWICE A DAY (Patient taking differently: TAKE 1 TABLET three  times A DAY)  . gabapentin (NEURONTIN) 100 MG capsule TAKE 1 CAPSULE (100 MG TOTAL) BY MOUTH AT BEDTIME.  Marland Kitchen glucose blood test strip Checks sugars 4 times daily, on meal time insulin  . insulin aspart (NOVOLOG FLEXPEN) 100 UNIT/ML FlexPen Inject 25-30 Units into the skin 3 (three) times daily before meals.  . Insulin Degludec (TRESIBA FLEXTOUCH) 200 UNIT/ML SOPN Inject 40 Units into the skin at bedtime.  . Insulin Degludec (TRESIBA FLEXTOUCH) 200 UNIT/ML SOPN Inject 40 Units as directed at bedtime.  . Insulin Pen Needle 32G X 4 MM MISC Use 4x a day  . meclizine (ANTIVERT) 25 MG tablet TAKE 1 TABLET (25 MG TOTAL) BY MOUTH 2 (TWO) TIMES DAILY.  . metFORMIN (GLUCOPHAGE) 1000 MG tablet Take 1 tablet (1,000 mg total) by mouth 2 (two) times daily with a meal.  . ONETOUCH DELICA LANCETS 81L MISC 100 PENS BY DOES NOT APPLY ROUTE 2 (TWO) TIMES DAILY.  Marland Kitchen potassium chloride (KLOR-CON 10) 10 MEQ tablet Take 1 tablet (10 mEq total) by mouth daily.  . predniSONE (DELTASONE) 10 MG tablet TAKE 2 TABLETS DAILY UNTIL BETTER,THEN START 1 DAILY (Patient taking differently: 1 tablet daily)  . rosuvastatin  (CRESTOR) 20 MG tablet TAKE 1 TABLET BY MOUTH EVERY DAY  . Semaglutide (OZEMPIC) 0.25 or 0.5 MG/DOSE SOPN Inject 0.5 mg into the skin once a week.  . Vitamin D, Ergocalciferol, (DRISDOL) 50000 units CAPS capsule Take 1 capsule (50,000 Units total) by mouth every 7 (seven) days.              Objective:   Physical Exam  amb obese bf nad   01/21/2017      312  12/03/2016      302  09/28/2016        290  08/17/2016        284  07/03/2016        276   06/04/2016       267   04/18/16 263 lb (119.3 kg)  03/26/16 264 lb 6.4 oz (119.9 kg)  03/20/16 263 lb (119.3 kg)   9/6/ 2017        295    Vital signs reviewed - Note on arrival 02 sats  97% on RA     HEENT: nl dentition, turbinates bilaterally, and oropharynx. Nl external ear canals without cough reflex   NECK :  without JVD/Nodes/TM/ nl carotid upstrokes bilaterally   LUNGS: no acc muscle use,  Nl contour chest which is clear to A and P bilaterally without cough on insp or exp maneuvers   CV:  RRR  no s3 or murmur or increase in P2, and no edema   ABD:  soft and nontender with nl inspiratory excursion in the supine position. No bruits or organomegaly appreciated, bowel sounds nl  MS:  Nl gait/ ext warm without deformities, calf tenderness, cyanosis or clubbing No obvious joint restrictions   SKIN: warm and dry with several carbuncles quarter to half dollar sized over trunk / no ED  NEURO:  alert, approp, nl sensorium with  no motor or cerebellar deficits apparent.        Assessment:

## 2017-01-21 NOTE — Assessment & Plan Note (Signed)
rx doxy 09/28/16 by derm Flare mid Oct 2018 >  doxy x 10 days by Margaretta Chittum > transiently improved   - repeat 10 days doxy 01/21/2017 >  Referred back to derm (2nd req)

## 2017-02-22 ENCOUNTER — Ambulatory Visit (INDEPENDENT_AMBULATORY_CARE_PROVIDER_SITE_OTHER): Payer: BC Managed Care – PPO | Admitting: Internal Medicine

## 2017-02-22 ENCOUNTER — Encounter: Payer: Self-pay | Admitting: Internal Medicine

## 2017-02-22 VITALS — BP 120/74 | HR 110 | Ht 67.0 in | Wt 318.0 lb

## 2017-02-22 DIAGNOSIS — E1165 Type 2 diabetes mellitus with hyperglycemia: Secondary | ICD-10-CM

## 2017-02-22 DIAGNOSIS — E785 Hyperlipidemia, unspecified: Secondary | ICD-10-CM | POA: Diagnosis not present

## 2017-02-22 DIAGNOSIS — E041 Nontoxic single thyroid nodule: Secondary | ICD-10-CM

## 2017-02-22 DIAGNOSIS — Z794 Long term (current) use of insulin: Secondary | ICD-10-CM | POA: Diagnosis not present

## 2017-02-22 LAB — POCT GLYCOSYLATED HEMOGLOBIN (HGB A1C): Hemoglobin A1C: 9.8

## 2017-02-22 MED ORDER — SEMAGLUTIDE (1 MG/DOSE) 2 MG/1.5ML ~~LOC~~ SOPN: 1.0000 mg | PEN_INJECTOR | SUBCUTANEOUS | 5 refills | Status: DC

## 2017-02-22 NOTE — Addendum Note (Signed)
Addended by: Drucilla Schmidt on: 02/22/2017 11:54 AM   Modules accepted: Orders

## 2017-02-22 NOTE — Progress Notes (Signed)
Patient ID: Amy Rosario, female   DOB: 03-07-1960, 57 y.o.   MRN: [id number]   HPI: Amy Rosario is a 57 y.o.-year-old female, initially referred by her PCP, Dr. Glade Rosario, for management of DM2, dx in 2015-2016, insulin-dependent since dx, uncontrolled, without long term complications. Last OV 3 months ago.  She is on prednisone 5 mg (since 01/2017), previously 10 mg daily for sarcoidosis (since 08/2016), previously 20 mg daily started 06/2016).  Last hemoglobin A1c was: Lab Results  Component Value Date   HGBA1C 12.6 11/19/2016   HGBA1C 12.9 (H) 08/29/2016   HGBA1C 8.5 02/03/2016  Her sugars were great in 06/2016, but then started to increase one month later after she was started on prednisone.  Pt was initially on a regimen of: - Novolog 70/30 25 units 2x a day - increased 10/04/2016. She was on Metformin 1000 mg 2x a day, with meals >> cannot remember why she was taken off the med.  She is now on: - Metformin 1000 mg 2x a day - Ozempic 0.5 mg weekly - started 11/2016 - NovoLog 25-30 units before meals (misses the dose before dinner) - Tresiba 40 units at bedtime  Pt checks her sugars 3-4x a day: - am: 326-525 >> 174-276 >> 117-275 (200s when missed Novolog with dinner) - 2h after b'fast: n/c >> 265-379, 387 >> 190-299 - before lunch: n/c - 2h after lunch: n/c - before dinner: n/c >> 318-471 >> n/c - 2h after dinner: n/c >> 335-455 >> 200, 336, 364 - bedtime: n/c >> 306-396 >> 333 - nighttime: n/c Lowest sugar was 265 >> 174 >> 117 Highest sugar was 525 >> 471 >> 364  Glucometer: Bayer Contour >> One Touch Ultra  Pt's meals are: - Breakfast: sausage, egg biscuits, hash brown,  - Lunch: chicken sandwich, mac and cheese - Dinner: steak and Gravy, green beans, corn, roll - Snacks: chips, 4 cookies  -No CKD, last BUN/creatinine:  Lab Results  Component Value Date   BUN 17 08/29/2016   BUN 23 03/26/2016   CREATININE 0.88 08/29/2016   CREATININE 1.51 (H) 03/26/2016   On valsartan 320. -+ HL; last set of lipids: Lab Results  Component Value Date   CHOL 194 08/29/2016   HDL 73 08/29/2016   LDLCALC 99 08/29/2016   TRIG 109 08/29/2016   CHOLHDL 2.7 08/29/2016  On Crestor 20. - last eye exam was in 09/2016: No DR.  + Floaters. - Denies numbness and tingling in her feet.  She also has a history of goiter and thyroid nodules; per ultrasound from 06/2013: Enlarged, heterogeneous, thyroid, with a 1.9 cm dominant complex nodule in the left lobe.  This nodule was biopsied in 2015: benign.  ROS: Constitutional: no weight gain/no weight loss, no fatigue, no subjective hyperthermia, no subjective hypothermia Eyes: no blurry vision, no xerophthalmia ENT: no sore throat, no nodules palpated in throat, no dysphagia, no odynophagia, no hoarseness Cardiovascular: no CP/no SOB/no palpitations/no leg swelling Respiratory: no cough/no SOB/no wheezing Gastrointestinal: no N/no V/no D/no C/no acid reflux Musculoskeletal: no muscle aches/no joint aches Skin: no rashes, no hair loss Neurological: no tremors/no numbness/no tingling/no dizziness  I reviewed pt's medications, allergies, PMH, social hx, family hx, and changes were documented in the history of present illness. Otherwise, unchanged from my initial visit note.  Past Medical History:  Diagnosis Date  . Back pain   . Diabetes mellitus without complication (Amy Rosario)   . Goiter    left side, saw dr Amy Rosario april 2015  for, had biopsy done was told it is cyst  . H/O echocardiogram    Dr. Charolette Ros[facility], [facility]  . Hyperlipidemia   . Hypertension 2000  . Impaired fasting blood sugar 2014  . Obesity   . Other screening mammogram    planned for 05/2013  . Routine gynecological examination    last pap 2012  . Sleep apnea    on CPAP setting of 2   Past Surgical History:  Procedure Laterality Date  . COLONOSCOPY WITH PROPOFOL N/A 09/21/2013   Procedure: COLONOSCOPY WITH PROPOFOL;   Surgeon: Amy Craver, MD;  Location: [facility];  Service: Endoscopy;  Laterality: N/A;  . CYST EXCISION     right low back  . TUBAL LIGATION    . VIDEO BRONCHOSCOPY Bilateral 07/05/2016   Procedure: VIDEO BRONCHOSCOPY WITH FLUORO;  Surgeon: Tanda Rockers, MD;  Location: [facility];  Service: Endoscopy;  Laterality: Bilateral;   Social History   Social History  . Marital status: Single    Spouse name: N/A  . Number of children: 3   Occupational History   . Smoking status: Never Smoker  . Smokeless tobacco: Never Used  . Alcohol use No  . Drug use: No   Social History Narrative   Lives with sister Amy Rosario, single, 3 sons, Exercise -walks a lot on the job, Consulting civil engineer at [employer]. [place]    Current Outpatient Medications on File Prior to Visit  Medication Sig Dispense Refill  . allopurinol (ZYLOPRIM) 100 MG tablet Take 1 tablet (100 mg total) by mouth daily. 30 tablet 2  . Amlodipine-Valsartan-HCTZ (EXFORGE HCT) 10-320-25 MG TABS Take 1 tablet by mouth daily. 30 tablet 1  . aspirin 81 MG tablet Take 1 tablet (81 mg total) by mouth daily. 90 tablet 3  . bisoprolol (ZEBETA) 10 MG tablet Take 1 tablet (10 mg total) by mouth daily. 30 tablet 11  . Blood Glucose Monitoring Suppl (ONE TOUCH ULTRA 2) w/Device KIT 1 Device by Does not apply route 2 (two) times daily. 1 each 0  . cloNIDine (CATAPRES) 0.2 MG tablet TAKE 1 TABLET TWICE A DAY (Patient taking differently: TAKE 1 TABLET three  times A DAY) 180 tablet 2  . doxycycline (VIBRA-TABS) 100 MG tablet Take 1 tablet (100 mg total) by mouth 2 (two) times daily. 20 tablet 0  . gabapentin (NEURONTIN) 100 MG capsule TAKE 1 CAPSULE (100 MG TOTAL) BY MOUTH AT BEDTIME. 30 capsule 2  . glucose blood test strip Checks sugars 4 times daily, on meal time insulin 120 each 11  . insulin aspart (NOVOLOG FLEXPEN) 100 UNIT/ML FlexPen Inject 25-30 Units into the skin 3 (three) times daily before meals. 30 mL 11  .  Insulin Degludec (TRESIBA FLEXTOUCH) 200 UNIT/ML SOPN Inject 40 Units into the skin at bedtime. 6 pen 11  . Insulin Degludec (TRESIBA FLEXTOUCH) 200 UNIT/ML SOPN Inject 40 Units as directed at bedtime. 9 pen 1  . Insulin Pen Needle 32G X 4 MM MISC Use 4x a day 200 each 6  . meclizine (ANTIVERT) 25 MG tablet TAKE 1 TABLET (25 MG TOTAL) BY MOUTH 2 (TWO) TIMES DAILY. 30 tablet 0  . metFORMIN (GLUCOPHAGE) 1000 MG tablet Take 1 tablet (1,000 mg total) by mouth 2 (two) times daily with a meal. 180 tablet 3  . ONETOUCH DELICA LANCETS 95M MISC 100 PENS BY DOES NOT APPLY ROUTE 2 (TWO) TIMES DAILY. 100 each 3  . potassium chloride (KLOR-CON 10) 10 MEQ  tablet Take 1 tablet (10 mEq total) by mouth daily. 90 tablet 3  . predniSONE (DELTASONE) 10 MG tablet TAKE 2 TABLETS DAILY UNTIL BETTER,THEN START 1 DAILY (Patient taking differently: 1 tablet daily) 60 tablet 1  . predniSONE (STERAPRED UNI-PAK 21 TAB) 5 MG (21) TBPK tablet 1 daily 100 tablet 0  . rosuvastatin (CRESTOR) 20 MG tablet TAKE 1 TABLET BY MOUTH EVERY DAY 90 tablet 2  . Semaglutide (OZEMPIC) 0.25 or 0.5 MG/DOSE SOPN Inject 0.5 mg into the skin once a week. 2 pen 2  . Vitamin D, Ergocalciferol, (DRISDOL) 50000 units CAPS capsule Take 1 capsule (50,000 Units total) by mouth every 7 (seven) days. 12 capsule 3   No current facility-administered medications on file prior to visit.    Allergies  Allergen Reactions  . Penicillins Hives and Swelling    Has patient had a PCN reaction causing immediate rash, facial/tongue/throat swelling, SOB or lightheadedness with hypotension: Yes Has patient had a PCN reaction causing severe rash involving mucus membranes or skin necrosis: No Has patient had a PCN reaction that required hospitalization: No Has patient had a PCN reaction occurring within the last 10 years: No If all of the above answers are "NO", then may proceed with Cephalosporin use.   Family History  Problem Relation Age of Onset  . Heart disease  Father   . Hypertension Father   . Heart disease Brother   . Cancer Neg Hx   . Stroke Neg Hx   . Diabetes Neg Hx    PE: BP 120/74   Pulse (!) 110   Ht _0  (1.702 m)   Wt (!) 318 lb (144.2 kg)   LMP 11/09/2013   SpO2 97%   BMI 49.81 kg/m  Wt Readings from Last 3 Encounters:  02/22/17 (!) 318 lb (144.2 kg)  01/21/17 (!) 312 lb (141.5 kg)  01/08/17 (!) 308 lb (139.7 kg)   Constitutional: Obese, in NAD Eyes: PERRLA, EOMI, no exophthalmos ENT: moist mucous membranes, + B smooth thyromegaly, no cervical lymphadenopathy Cardiovascular: tachycardia, RR, No MRG Respiratory: CTA B Gastrointestinal: abdomen soft, NT, ND, BS+ Musculoskeletal: no deformities, strength intact in all 4 Skin: moist, warm, no rashes, + skin tags Neurological: no tremor with outstretched hands, DTR normal in all 4  ASSESSMENT: 1. DM2, insulin-dependent, uncontrolled, without  long-term complications, but with significant hyperglycemia   2. HL   3. Thyroid nodule  PLAN:  1. Patient with long-standing, uncontrolled, type 2 diabetes, worse after starting prednisone for sarcoidosis.  Her sugars at last visit were still very high after changing from premixed insulin to a basal-bolus insulin regimen with metformin.  Therefore, at last visit, we added a GLP-1 receptor agonist, Ozempic.   - Sugars are finally better, but still higher than target especially if she forgets dinnertime Novolog, which is very often >> strongly advised to take this 15 min before dinner. She may also forget Tresiba at bedtime >> advised her to take this any time of the day - will increase Ozempic to 1 mg weekly as she has no SEs from it - Reviewed previous HbA1c, which was very high, 12.6% - I suggested to:  Patient Instructions  Please continue: - Metformin 1000 mg 2x a day - NovoLog 25-30 units before meals - try not to forget the dinner dose - Tresiba 40 units daily - you can take this at any time of the day  Please  increase:  - Ozempic to 1 mg weekly  Please return in  3 months with your sugar log.   - today, HbA1c is 9.8% (much better!) - continue checking sugars at different times of the day - check 3x a day, rotating checks - advised for yearly eye exams >> she is UTD - Return to clinic in 3 mo with sugar log   2. HL  - reviewed recent lipid panel from 08/2016: LDL lower than 100, triglycerides at goal - Continues on Crestor 20, without side effects   3. Thyroid nodule - reviewed latest U/S report along with the pt >> 1.9 cm nodule  - benign - no neck compression sxs, despite large goiter - plan to repeat a thyroid U/S this year  Philemon Kingdom, MD PhD [facility] Endocrinology

## 2017-02-22 NOTE — Patient Instructions (Addendum)
Please continue: - Metformin 1000 mg 2x a day - NovoLog 25-30 units before meals - try not to forget the dinner dose - Tresiba 40 units daily - you can take this at any time of the day  Please increase:  - Ozempic to 1 mg weekly  Please return in 3 months with your sugar log.

## 2017-03-14 ENCOUNTER — Other Ambulatory Visit: Payer: Self-pay | Admitting: Medical

## 2017-03-14 DIAGNOSIS — M79672 Pain in left foot: Secondary | ICD-10-CM

## 2017-03-14 DIAGNOSIS — M79671 Pain in right foot: Secondary | ICD-10-CM

## 2017-03-14 DIAGNOSIS — M792 Neuralgia and neuritis, unspecified: Secondary | ICD-10-CM

## 2017-03-14 DIAGNOSIS — E119 Type 2 diabetes mellitus without complications: Secondary | ICD-10-CM

## 2017-03-14 NOTE — Telephone Encounter (Signed)
Is this okay to refill? 

## 2017-03-17 ENCOUNTER — Other Ambulatory Visit: Payer: Self-pay | Admitting: Internal Medicine

## 2017-03-21 ENCOUNTER — Other Ambulatory Visit (HOSPITAL_COMMUNITY)
Admission: RE | Admit: 2017-03-21 | Discharge: 2017-03-21 | Disposition: A | Discharge status: Home / Self Care | Payer: BC Managed Care – PPO | Source: Ambulatory Visit | Attending: Obstetrics and Gynecology | Admitting: Obstetrics and Gynecology

## 2017-03-21 ENCOUNTER — Other Ambulatory Visit: Payer: Self-pay

## 2017-03-21 ENCOUNTER — Encounter: Payer: Self-pay | Admitting: Obstetrics and Gynecology

## 2017-03-21 ENCOUNTER — Ambulatory Visit: Payer: BC Managed Care – PPO | Admitting: Obstetrics and Gynecology

## 2017-03-21 VITALS — BP 150/98 | HR 104 | Resp 24 | Ht 67.75 in | Wt 313.0 lb

## 2017-03-21 DIAGNOSIS — Z124 Encounter for screening for malignant neoplasm of cervix: Secondary | ICD-10-CM

## 2017-03-21 DIAGNOSIS — Z01419 Encounter for gynecological examination (general) (routine) without abnormal findings: Secondary | ICD-10-CM

## 2017-03-21 NOTE — Progress Notes (Signed)
57 y.o. [id number] SingleAfrican AmericanF here for annual exam.  No vaginal bleeding, not sexually active.     Patient's last menstrual period was 11/09/2013.          Sexually active: No.  The current method of family planning is post menopausal status.    Exercising: No.  The patient does not participate in regular exercise at present. Smoker:  no  Health Maintenance: Pap:  03-20-16 WNL NEG HR HPV 05-27-13 LGSIL NEG HR HPV  History of abnormal Pap:  Yes LGSIL 2015 MMG:  03-23-16 DX MMG  WNL Colonoscopy:  09-21-13 normal  BMD:   Never TDaP:  05-27-13 Gardasil: N/A   reports that  has never smoked. she has never used smokeless tobacco. She reports that she does not drink alcohol or use drugs. 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  3 grown kids (all boys), 9 grand kids  Past Medical History:  Diagnosis Date  . Back pain   . Diabetes mellitus without complication ([place])   . Goiter    left side, saw dr tysinger april 2015 for, had biopsy done was told it is cyst  . H/O echocardiogram    Dr. Charolette Forward, [facility]  . Hyperlipidemia   . Hypertension 2000  . Impaired fasting blood sugar 2014  . Obesity   . Other screening mammogram    planned for 05/2013  . Routine gynecological examination    last pap 2012  . Sarcoid   . Sleep apnea    on CPAP setting of 2    Past Surgical History:  Procedure Laterality Date  . COLONOSCOPY WITH PROPOFOL N/A 09/21/2013   Procedure: COLONOSCOPY WITH PROPOFOL;  Surgeon: Juanita Craver, MD;  Location: [facility];  Service: Endoscopy;  Laterality: N/A;  . CYST EXCISION     right low back  . TUBAL LIGATION    . VIDEO BRONCHOSCOPY Bilateral 07/05/2016   Procedure: VIDEO BRONCHOSCOPY WITH FLUORO;  Surgeon: Tanda Rockers, MD;  Location: [facility];  Service: Endoscopy;  Laterality: Bilateral;    Current Outpatient Medications  Medication Sig Dispense Refill  . allopurinol (ZYLOPRIM) 100 MG tablet Take 1 tablet (100 mg total)  by mouth daily. 30 tablet 2  . Amlodipine-Valsartan-HCTZ (EXFORGE HCT) 10-320-25 MG TABS Take 1 tablet by mouth daily. 30 tablet 1  . aspirin 81 MG tablet Take 1 tablet (81 mg total) by mouth daily. 90 tablet 3  . bisoprolol (ZEBETA) 10 MG tablet Take 1 tablet (10 mg total) by mouth daily. 30 tablet 11  . Blood Glucose Monitoring Suppl (ONE TOUCH ULTRA 2) w/Device KIT 1 Device by Does not apply route 2 (two) times daily. 1 each 0  . cloNIDine (CATAPRES) 0.2 MG tablet TAKE 1 TABLET TWICE A DAY (Patient taking differently: TAKE 1 TABLET three  times A DAY) 180 tablet 2  . gabapentin (NEURONTIN) 100 MG capsule TAKE 1 CAPSULE (100 MG TOTAL) BY MOUTH AT BEDTIME. 90 capsule 2  . glucose blood test strip Checks sugars 4 times daily, on meal time insulin 120 each 11  . insulin aspart (NOVOLOG FLEXPEN) 100 UNIT/ML FlexPen Inject 25-30 Units into the skin 3 (three) times daily before meals. 30 mL 11  . Insulin Degludec (TRESIBA FLEXTOUCH) 200 UNIT/ML SOPN Inject 40 Units into the skin at bedtime. 6 pen 11  . Insulin Pen Needle 32G X 4 MM MISC Use 4x a day 200 each 6  . metFORMIN (GLUCOPHAGE) 1000 MG tablet Take 1 tablet (1,000 mg  total) by mouth 2 (two) times daily with a meal. 180 tablet 3  . ONETOUCH DELICA LANCETS 84Z MISC 100 PENS BY DOES NOT APPLY ROUTE 2 (TWO) TIMES DAILY. 100 each 3  . potassium chloride (KLOR-CON 10) 10 MEQ tablet Take 1 tablet (10 mEq total) by mouth daily. 90 tablet 3  . predniSONE (STERAPRED UNI-PAK 21 TAB) 5 MG (21) TBPK tablet 1 daily 100 tablet 0  . rosuvastatin (CRESTOR) 20 MG tablet TAKE 1 TABLET BY MOUTH EVERY DAY 90 tablet 2  . Semaglutide (OZEMPIC) 1 MG/DOSE SOPN Inject 1 mg into the skin once a week. 2 pen 5  . TRESIBA FLEXTOUCH 200 UNIT/ML SOPN INJECT 40 UNITS AS DIRECTED AT BEDTIME. 9 pen 1  . Vitamin D, Ergocalciferol, (DRISDOL) 50000 units CAPS capsule Take 1 capsule (50,000 Units total) by mouth every 7 (seven) days. 12 capsule 3   No current facility-administered  medications for this visit.     Family History  Problem Relation Age of Onset  . Heart disease Father   . Hypertension Father   . Heart disease Brother   . Cancer Neg Hx   . Stroke Neg Hx   . Diabetes Neg Hx     Review of Systems  Constitutional: Negative.   HENT: Negative.   Eyes: Negative.   Respiratory: Negative.   Cardiovascular: Negative.   Gastrointestinal: Negative.   Endocrine: Negative.   Genitourinary: Negative.   Musculoskeletal: Negative.   Skin: Negative.   Allergic/Immunologic: Negative.   Neurological: Negative.   Psychiatric/Behavioral: Negative.     Exam:   BP (!) 150/98 (BP Location: Right Arm, Patient Position: Sitting, Cuff Size: Large)   Pulse (!) 104   Resp (!) 24   Ht 5' 7.75" (1.721 m)   Wt (!) 313 lb (142 kg)   LMP 11/09/2013   BMI 47.94 kg/m   Weight change: _0 @ Height:   Height: 5' 7.75" (172.1 cm)  Ht Readings from Last 3 Encounters:  03/21/17 5' 7.75" (1.721 m)  02/22/17 _1  (1.702 m)  01/21/17 _2  (1.702 m)    General appearance: alert, cooperative and appears stated age Head: Normocephalic, without obvious abnormality, atraumatic Neck: no adenopathy, supple, symmetrical, trachea midline and thyroid enlarged and goiter Lungs: clear to auscultation bilaterally Cardiovascular: regular rate and rhythm Breasts: normal appearance, no masses or tenderness Abdomen: soft, non-tender; non distended,  no masses,  no organomegaly Extremities: extremities normal, atraumatic, no cyanosis or edema Skin: Skin color, texture, turgor normal. No rashes or lesions Lymph nodes: Cervical, supraclavicular, and axillary nodes normal. No abnormal inguinal nodes palpated Neurologic: Grossly normal   Pelvic: External genitalia:  no lesions              Urethra:  normal appearing urethra with no masses, tenderness or lesions              Bartholins and Skenes: normal                 Vagina: normal appearing vagina with normal color and  discharge, no lesions              Cervix: no lesions               Bimanual Exam:  Uterus:  exam limitied by BMI, no masses or tenderness              Adnexa: no mass, fullness, tenderness               Rectovaginal:  Confirms               Anus:  normal sphincter tone, no lesions  Chaperone was present for exam.  A:  Well Woman with normal exam  Multiple medical issues, managed by her primary MD  P:   Pap with hpv  Discussed breast self exam  Discussed calcium and vit D intake  Mammogram due, # given to schedule  Colonoscopy UTD  Labs with primary

## 2017-03-21 NOTE — Patient Instructions (Signed)

## 2017-03-22 LAB — CYTOLOGY - PAP
DIAGNOSIS: NEGATIVE
HPV: NOT DETECTED

## 2017-04-18 ENCOUNTER — Other Ambulatory Visit: Payer: Self-pay | Admitting: Internal Medicine

## 2017-04-22 ENCOUNTER — Ambulatory Visit: Payer: BC Managed Care – PPO | Admitting: Internal Medicine

## 2017-04-26 ENCOUNTER — Other Ambulatory Visit: Payer: Self-pay | Admitting: Internal Medicine

## 2017-04-26 DIAGNOSIS — D869 Sarcoidosis, unspecified: Secondary | ICD-10-CM

## 2017-04-29 ENCOUNTER — Ambulatory Visit (INDEPENDENT_AMBULATORY_CARE_PROVIDER_SITE_OTHER): Payer: BC Managed Care – PPO | Admitting: Internal Medicine

## 2017-04-29 DIAGNOSIS — D869 Sarcoidosis, unspecified: Secondary | ICD-10-CM

## 2017-04-29 LAB — PULMONARY FUNCTION TEST
DL/VA % PRED: 116 %
DL/VA: 6.16 ml/min/mmHg/L
DLCO unc % pred: 67 %
DLCO unc: 20.74 ml/min/mmHg
FEF 25-75 POST: 2.94 L/s
FEF 25-75 Pre: 2.82 L/sec
FEF2575-%CHANGE-POST: 4 %
FEF2575-%PRED-POST: 111 %
FEF2575-%Pred-Pre: 107 %
FEV1-%Change-Post: 0 %
FEV1-%PRED-PRE: 76 %
FEV1-%Pred-Post: 76 %
FEV1-PRE: 2.02 L
FEV1-Post: 2.04 L
FEV1FVC-%Change-Post: 2 %
FEV1FVC-%Pred-Pre: 109 %
FEV6-%Change-Post: -1 %
FEV6-%PRED-PRE: 69 %
FEV6-%Pred-Post: 68 %
FEV6-Post: 2.24 L
FEV6-Pre: 2.27 L
FEV6FVC-%Pred-Post: 103 %
FEV6FVC-%Pred-Pre: 103 %
FVC-%Change-Post: -1 %
FVC-%PRED-POST: 66 %
FVC-%PRED-PRE: 67 %
FVC-POST: 2.24 L
FVC-Pre: 2.27 L
POST FEV1/FVC RATIO: 91 %
POST FEV6/FVC RATIO: 100 %
PRE FEV1/FVC RATIO: 89 %
Pre FEV6/FVC Ratio: 100 %
RV % pred: 65 %
RV: 1.38 L
TLC % PRED: 63 %
TLC: 3.66 L

## 2017-04-29 NOTE — Progress Notes (Signed)
PFT completed today 04/29/17

## 2017-05-03 ENCOUNTER — Ambulatory Visit: Payer: BC Managed Care – PPO | Admitting: Internal Medicine

## 2017-05-03 ENCOUNTER — Encounter: Payer: Self-pay | Admitting: Internal Medicine

## 2017-05-03 ENCOUNTER — Ambulatory Visit (INDEPENDENT_AMBULATORY_CARE_PROVIDER_SITE_OTHER)
Admission: RE | Admit: 2017-05-03 | Discharge: 2017-05-03 | Disposition: A | Discharge status: Home / Self Care | Payer: BC Managed Care – PPO | Source: Ambulatory Visit | Attending: Internal Medicine | Admitting: Internal Medicine

## 2017-05-03 VITALS — BP 132/86 | HR 100 | Ht 67.0 in | Wt 309.0 lb

## 2017-05-03 DIAGNOSIS — D869 Sarcoidosis, unspecified: Secondary | ICD-10-CM

## 2017-05-03 DIAGNOSIS — E049 Nontoxic goiter, unspecified: Secondary | ICD-10-CM

## 2017-05-03 DIAGNOSIS — E040 Nontoxic diffuse goiter: Secondary | ICD-10-CM

## 2017-05-03 DIAGNOSIS — R05 Cough: Secondary | ICD-10-CM

## 2017-05-03 DIAGNOSIS — R058 Other specified cough: Secondary | ICD-10-CM | POA: Insufficient documentation

## 2017-05-03 MED ORDER — PANTOPRAZOLE SODIUM 40 MG PO TBEC: 40.0000 mg | DELAYED_RELEASE_TABLET | Freq: Every day | ORAL | 2 refills | Status: DC

## 2017-05-03 MED ORDER — FAMOTIDINE 20 MG PO TABS: ORAL_TABLET | ORAL | 11 refills | Status: DC

## 2017-05-03 MED ORDER — BENZONATATE 200 MG PO CAPS: 200.0000 mg | ORAL_CAPSULE | Freq: Three times a day (TID) | ORAL | 5 refills | Status: DC | PRN

## 2017-05-03 NOTE — Patient Instructions (Addendum)
Prednisone ceiling is 20 mg per day until coughing / breathing better then taper to floor of 5 mg every other day   Pantoprazole (protonix) 40 mg   Take  30-60 min before first meal of the day and Pepcid (famotidine)  20 mg one @  bedtime until cough is gone for a week  Then try off  GERD (REFLUX)  is an extremely common cause of respiratory symptoms just like yours , many times with no obvious heartburn at all.    It can be treated with medication, but also with lifestyle changes including elevation of the head of your bed (ideally with 6 inch  bed blocks),  Smoking cessation, avoidance of late meals, excessive alcohol, and avoid fatty foods, chocolate, peppermint, colas, red wine, and acidic juices such as orange juice.  NO MINT OR MENTHOL PRODUCTS SO NO COUGH DROPS  USE SUGARLESS CANDY INSTEAD (Jolley ranchers or Stover's or Life Savers) or even ice chips will also do - the key is to swallow to prevent all throat clearing. NO OIL BASED VITAMINS - use powdered substitutes.   For cough > tessalon 200 mg every 8 hours as needed   Please remember to go to the  x-ray department downstairs in the basement  for your tests - we will call you with the results when they are available.       Please schedule a follow up visit in 3 months but call sooner if needed

## 2017-05-03 NOTE — Progress Notes (Signed)
Was able to talk to the patient regarding their results.  They verbalized an understanding of what was discussed. No further questions at this time. 

## 2017-05-03 NOTE — Progress Notes (Signed)
Subjective:     Patient ID: Amy Rosario, female   DOB: 04/06/1960     MRN: [id number]    Brief patient profile:  56 yobf with MO never smoker  Presented sob/abn lfts Feb 2018 > ? dx Sarcoid > referred to pulmonary clinic 04/18/2016 by Sean Tysinger PA  - FOB 07/05/16> severe diffuse cobblestoning :  Endobronchial bx:  NCG    Last worked in Mar 25 2016   History of Present Illness  04/18/2016 1st [facility] office visit/ Wert   Chief Complaint  Patient presents with  . Pulmonary Consult    Referred by Dr. David Tysinger for eval of Sarcoidosis. Pt states she was just dxed recently. She started having SOB in mid Feb 2018.  She gets winded walking short distances such as to her mailbox and back.  She also c/o cough- prod at times with minimal yellow to clear sputum. She states she has recently noticed bumps on her face.   onset of sob was indolent really since first of 2018 assoc with abd swelling and  mostly dry cough then noted "red bumps" on face and some aching of elbows but not ankles or wrists. No viz symptoms/ some anorexia but min wt loss  Doe = MMRC1 = can walk nl pace, flat grade, can't hurry or go uphills or steps s sob   rec Sarcoidosis is a benign inflammatory condition  Treatment is generally reserved for patients with major symptoms  schedule dermatology for skin biopsy> neg for sarcoid       06/04/2016  f/u ov/Wert re:  Sarcoidosis  Chief Complaint  Patient presents with  . Follow-up    4 wk f/u for sarcoidosis.   new problem = stiffness and swelling in hands / had skin bx but doesn't know her doctor's name that treats this "it's gout" No change doe = mmrc 1  rec Prednisone 10 mg take  4 each am x 2 days,   2 each am x 2 days,  1 each am x 2 days and stop  Please remember to go to the lab  department downstairs in the basement  for your tests - we will call you with the results when they are available. Stop tenormin and start bisoprolol  10 mg daily     07/03/2016  f/u ov/Wert re: sarcoidosis  Chief Complaint  Patient presents with  . Follow-up    Pt c/o stiffness in her neck in the am's.  She also c/o swelling in both hands.   much better on pred, much worse off in terms of all her arthritis symptoms esp L hand / wrist  Doe continues at level = MMRC1 rec - FOB 07/05/16> severe diffuse cobblestoning :  Endobronchial bx:  NCG rec pred 20 mg daily until better then 10 mg daily and f/u in one month   08/17/2016  f/u ov/Wert re:  Sarcoid with prominent airway involvement on pred 10 mg x one week s flare  Chief Complaint  Patient presents with  . Follow-up    Arthritic pain improved on pred. Breathing is doing well and no new co's today.    main symptom arthritis elbows/ hands resolved/  Not limited by breathing from desired activities  But not very active rec Take zebeta (10 mg) each am and continue clonidine 0.2 three times day  Prednisone ceiling of 20 mg per day and floor of 5 mg with breakfast          09/28/2016    f/u ov/Wert re: sarcoid / still on pred 10 mg daily/ did not try to taper ? Why  Chief Complaint  Patient presents with  . Follow-up    Breathing is doing well. No new co's today.   Not limited by breathing from desired activities  / no arthritis/ cough  Has not seen eye doctor  rec Please remember to go to the lab department downstairs in the basement  for your tests - we will call you with the results when they are available. If breathing or coughing worse, pred should be 20 mg per day otherwise taper to  5 mg daily  Patient coordinator   to schedule opthamology eval due to your sarcoidosis - we will be in touch     12/03/2016  f/u ov/Wert re: sarcoid / pred at 10 mg daily  Chief Complaint  Patient presents with  . Follow-up    Pt c/o breaking out in bumps all over her body. Denies any SOB, cough, or CP.  bumps started back 3 weeks prior to OV  , not itchy, very similar to previous lesion dx as bullous  impetigoneg for sarcoid per dermatology/ has not been back to Dr Gould or taken any of the meds she prev used for the other cyst(included doxy) rec Doxycycline 100mg twice daily x 10 days and follow with your dermatologist = Karen Gould at [address]  Try prednisone 5 mg daily and see what difference it makes with cough or short of breath - ceiling is 20 mg daily and floor is 5 mg daily    01/21/2017  f/u ov/Wert re: sarcoid  On pred at 10 mg daily - did not taper  Chief Complaint  Patient presents with  . Follow-up    Bumps resolved on doxy and then returned once she was completed course. She denies any new co's.   breathing is good, no cough/ joint stiffness  Has not seen derm, prior rx for doxy helped then recurred several weeks p completed course rec Prednisone ceiling is 20 mg per day, floor is 5 mg daily and if you do great on the 5 mg daily x for a month, try taking on even days Doxy 100 mg twice daily x 10 days and follow up with your dermatologist     05/03/2017  f/u ov/Wert re: sarcoid f/u 5 mg qod  Chief Complaint  Patient presents with  . Pulmonary follow up    productive Cough  clear in color since 04/29/17, Has felt like the right side of her neck is swollen and sore for since 04/26/17    Dyspnea: not limiting Cough: 04/29/17 acute onset p pfts-otherwise was doing fine  / cough is day > noct, min mucoid  Sleep: noct "wheeze"  No obvious day to day or daytime variability or assoc excess/ purulent sputum or mucus plugs or hemoptysis or cp or chest tightness, subjective wheeze or overt sinus or hb symptoms. No unusual exposure hx or h/o childhood pna/ asthma or knowledge of premature birth.   . Also denies any obvious fluctuation of symptoms with weather or environmental changes or other aggravating or alleviating factors except as outlined above   Current Allergies, Complete Past Medical History, Past Surgical History, Family History, and Social History were reviewed in  [facility] electronic medical record.  ROS  The following are not active complaints unless bolded Hoarseness, sore throat/ neck pain , dysphagia, dental problems, itching, sneezing,  nasal congestion or discharge of excess mucus or   purulent secretions, ear ache,   fever, chills, sweats, unintended wt loss or wt gain, classically pleuritic or exertional cp,  orthopnea pnd or leg swelling, presyncope, palpitations, abdominal pain, anorexia, nausea, vomiting, diarrhea  or change in bowel habits or change in bladder habits, change in stools or change in urine, dysuria, hematuria,  rash, arthralgias, visual complaints, headache, numbness, weakness or ataxia or problems with walking or coordination,  change in mood/affect or memory.        Current Meds  Medication Sig  . allopurinol (ZYLOPRIM) 100 MG tablet Take 1 tablet (100 mg total) by mouth daily.  . Amlodipine-Valsartan-HCTZ (EXFORGE HCT) 10-320-25 MG TABS Take 1 tablet by mouth daily.  . aspirin 81 MG tablet Take 1 tablet (81 mg total) by mouth daily.  . bisoprolol (ZEBETA) 10 MG tablet Take 1 tablet (10 mg total) by mouth daily.  . Blood Glucose Monitoring Suppl (ONE TOUCH ULTRA 2) w/Device KIT 1 Device by Does not apply route 2 (two) times daily.  . cloNIDine (CATAPRES) 0.2 MG tablet TAKE 1 TABLET TWICE A DAY (Patient taking differently: TAKE 1 TABLET three  times A DAY)  . gabapentin (NEURONTIN) 100 MG capsule TAKE 1 CAPSULE (100 MG TOTAL) BY MOUTH AT BEDTIME.  . glucose blood test strip Checks sugars 4 times daily, on meal time insulin  . insulin aspart (NOVOLOG FLEXPEN) 100 UNIT/ML FlexPen Inject 25-30 Units into the skin 3 (three) times daily before meals.  . Insulin Degludec (TRESIBA FLEXTOUCH) 200 UNIT/ML SOPN Inject 40 Units into the skin at bedtime.  . Insulin Pen Needle 32G X 4 MM MISC Use 4x a day  . metFORMIN (GLUCOPHAGE) 1000 MG tablet Take 1 tablet (1,000 mg total) by mouth 2 (two) times daily with a meal.  . ONETOUCH DELICA  LANCETS 33G MISC 100 PENS BY DOES NOT APPLY ROUTE 2 (TWO) TIMES DAILY.  . potassium chloride (KLOR-CON 10) 10 MEQ tablet Take 1 tablet (10 mEq total) by mouth daily.  . predniSONE (DELTASONE) 5 MG tablet TAKE 1 TABLET BY MOUTH EVERY DAY  . rosuvastatin (CRESTOR) 20 MG tablet TAKE 1 TABLET BY MOUTH EVERY DAY  . Semaglutide (OZEMPIC) 1 MG/DOSE SOPN Inject 1 mg into the skin once a week.  . TRESIBA FLEXTOUCH 200 UNIT/ML SOPN INJECT 40 UNITS AS DIRECTED AT BEDTIME.  . Vitamin D, Ergocalciferol, (DRISDOL) 50000 units CAPS capsule Take 1 capsule (50,000 Units total) by mouth every 7 (seven) days.                Objective:   Physical Exam  amb obese bf nad   05/03/2017        309  01/21/2017      312  12/03/2016      302  09/28/2016        290  08/17/2016        284  07/03/2016        276   06/04/2016       267   04/18/16 263 lb (119.3 kg)  03/26/16 264 lb 6.4 oz (119.9 kg)  03/20/16 263 lb (119.3 kg)   9/6/ 2017        295   Vital signs reviewed - Note on arrival 02 sats  97% on RA         HEENT: nl dentition, turbinates bilaterally, and oropharynx. Nl external ear canals without cough reflex   NECK :  without JVD/Nodes/  nl carotid upstrokes bilaterally - massive TM R > L lobes /   trachea midline    LUNGS: no acc muscle use,  Nl contour chest which is clear to A and P bilaterally without cough on insp or exp maneuvers   CV:  RRR  no s3 or murmur or increase in P2, and no edema   ABD:  soft and nontender with nl inspiratory excursion in the supine position. No bruits or organomegaly appreciated, bowel sounds nl  MS:  Nl gait/ ext warm without deformities, calf tenderness, cyanosis or clubbing No obvious joint restrictions   SKIN: warm and dry without lesions    NEURO:  alert, approp, nl sensorium with  no motor or cerebellar deficits apparent.      CXR PA and Lateral:   05/03/2017 :    I personally reviewed images and agree with radiology impression as follows:    Chronic bronchitic changes with RIGHT middle lobe scarring. No acute abnormalities.      Assessment:           

## 2017-05-04 ENCOUNTER — Encounter: Payer: Self-pay | Admitting: Internal Medicine

## 2017-05-04 DIAGNOSIS — E049 Nontoxic goiter, unspecified: Secondary | ICD-10-CM | POA: Insufficient documentation

## 2017-05-04 DIAGNOSIS — E040 Nontoxic diffuse goiter: Secondary | ICD-10-CM | POA: Insufficient documentation

## 2017-05-04 NOTE — Assessment & Plan Note (Signed)
Cough is typical of Upper airway cough syndrome (previously labeled PNDS),  is so named because it's frequently impossible to sort out how much is  CR/sinusitis with freq throat clearing (which can be related to primary GERD)   vs  causing  secondary (" extra esophageal")  GERD from wide swings in gastric pressure that occur with throat clearing, often  promoting self use of mint and menthol lozenges that reduce the lower esophageal sphincter tone and exacerbate the problem further in a cyclical fashion.   These are the same pts (now being labeled as having "irritable larynx syndrome" by some cough centers) who not infrequently have a history of having failed to tolerate ace inhibitors,  dry powder inhalers or biphosphonates or report having atypical/extraesophageal reflux symptoms that don't respond to standard doses of PPI  and are easily confused as having aecopd or asthma flares by even experienced allergists/ pulmonologists (myself included).  rec max rx for gerd/ control with tessalon 200 mg up to qid prn and f/u with ent prn

## 2017-05-04 NOTE — Assessment & Plan Note (Addendum)
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 - ESR 06/04/2016  119 >  Prednisone 10 mg take  4 each am x 2 days,   2 each am x 2 days,  1 each am x 2 days and stop  - PFT's  06/04/2016  FVC  2.46 (73%)  No obst and no improvement from saba p nothing  prior to study with DLCO  64/63 % corrects to 105  % for alv volume   - ACE level 06/04/2016 = 260  - FOB 07/05/16> severe diffuse cobblestoning :  Endobronchial bx:  NCG rec pred 20 mg daily until better then 10 mg daily   - 08/17/2016 improved so taper to 5 mg daily as floor  - ESR 09/28/2016 down to 35/angiotensin 94  on 10 mg daily s symptoms> rec ceiling 20/ floor 5 mg daily  - referred to ophthalmology 09/28/2016 > neg  - PFT's  04/29/2017  FVC 2.26(67%)   with DLCO  67 % corrects to 116  % for alv volume  And ERV 46%  Really not clear if any of her symtpoms at this poin are due to sarcoid  A good rule of thumb is that >95% of pts with active sarcoid in any organ will have some plain cxr changes - on the other hand  if there are active pulmonary symptoms the cxr will look much worse than the patient:  No evidence of either scenario here/ strongly doubt active dz .   She is better since rx with prednisone and will continue to encourage her to use a ceiling of 20 mg per day and a floor of 5 mg qod for now using hte lowest dose that works is the right dose approach, albeit with very non-specific indicators for adjusments   Each maintenance medication was reviewed in detail including most importantly the difference between maintenance and as needed and under what circumstances the prns are to be used.  Please see AVS for specific  Instructions which are unique to this visit and I personally typed out  which were reviewed in detail in writing with the patient and a copy provided.

## 2017-05-04 NOTE — Assessment & Plan Note (Addendum)
ERV 04/29/17 = 46% c/w body habitus   Body mass index is 48.4 kg/m.  -  trending down slightly but a long way to go  Lab Results  Component Value Date   TSH 3.60 03/08/2016     Contributing to gerd risk/ doe/reviewed the need and the process to achieve and maintain neg calorie balance > defer f/u primary care including intermittently monitoring thyroid status

## 2017-05-04 NOTE — Assessment & Plan Note (Signed)
bx 07/02/13 c/w benign goiter   Very impressive on exam today but not compromising airway so defer to Amy Rosario re further w/u / consultation with endocrine prn

## 2017-05-20 ENCOUNTER — Encounter: Payer: Self-pay | Admitting: Medical

## 2017-05-20 ENCOUNTER — Ambulatory Visit: Payer: BC Managed Care – PPO | Admitting: Medical

## 2017-05-20 VITALS — BP 142/92 | HR 83 | Ht 68.5 in | Wt 310.4 lb

## 2017-05-20 DIAGNOSIS — M109 Gout, unspecified: Secondary | ICD-10-CM

## 2017-05-20 DIAGNOSIS — I10 Essential (primary) hypertension: Secondary | ICD-10-CM

## 2017-05-20 DIAGNOSIS — E041 Nontoxic single thyroid nodule: Secondary | ICD-10-CM

## 2017-05-20 DIAGNOSIS — E049 Nontoxic goiter, unspecified: Secondary | ICD-10-CM

## 2017-05-20 DIAGNOSIS — E559 Vitamin D deficiency, unspecified: Secondary | ICD-10-CM | POA: Insufficient documentation

## 2017-05-20 DIAGNOSIS — E785 Hyperlipidemia, unspecified: Secondary | ICD-10-CM | POA: Diagnosis not present

## 2017-05-20 DIAGNOSIS — Z794 Long term (current) use of insulin: Secondary | ICD-10-CM | POA: Diagnosis not present

## 2017-05-20 DIAGNOSIS — E1165 Type 2 diabetes mellitus with hyperglycemia: Secondary | ICD-10-CM

## 2017-05-20 DIAGNOSIS — E040 Nontoxic diffuse goiter: Secondary | ICD-10-CM

## 2017-05-20 NOTE — Progress Notes (Signed)
Subjective: Chief Complaint  Patient presents with  . Diabetes    sugars are up and down due to prednisone, dr wert stated she woudl seen be off.   Here for recheck.  Since last visit she has had f/u with several specialists.  She saw Dr. Melvyn Novas recently who wanted [place] to look at her goiter, seemed swollen.   She notes Dr. Melvyn Novas recently had her continue Prednisone 4m daily.   HTN - seeing 115/78 and numbers such as 133/80, 140 SBP.  Highest was 146/90.   Cardiology - has seen Dr. HTerrence Dupontin the past, maybe 2 years ago.   Vit D deficiency - still taking the weekly Vit D.    Exercise - walks 1.5 hours broken up in the increments several days per week.   No weight bearing exercise.   Diet - trying to stay on top of diet.      Past Medical History:  Diagnosis Date  . Back pain   . Diabetes mellitus without complication [place]   . Goiter    left side, saw dr tysinger april 2015 for, had biopsy done was told it is cyst  . H/O echocardiogram    Dr. [facility]  . Hyperlipidemia   . Hypertension 2000  . Impaired fasting blood sugar 2014  . Obesity   . Other screening mammogram    planned for 05/2013  . Routine gynecological examination    last pap 2012  . Sarcoid   . Sleep apnea    on CPAP setting of 2     Current Outpatient Medications on File Prior to Visit  Medication Sig Dispense Refill  . allopurinol (ZYLOPRIM) 100 MG tablet Take 1 tablet (100 mg total) by mouth daily. 30 tablet 2  . Amlodipine-Valsartan-HCTZ (EXFORGE HCT) 10-320-25 MG TABS Take 1 tablet by mouth daily. 30 tablet 1  . aspirin 81 MG tablet Take 1 tablet (81 mg total) by mouth daily. 90 tablet 3  . benzonatate (TESSALON) 200 MG capsule Take 1 capsule (200 mg total) by mouth 3 (three) times daily as needed for cough. 45 capsule 5  . bisoprolol (ZEBETA) 10 MG tablet Take 1 tablet (10 mg total) by mouth daily. 30 tablet 11  . Blood Glucose Monitoring Suppl (ONE TOUCH ULTRA 2)  w/Device KIT 1 Device by Does not apply route 2 (two) times daily. 1 each 0  . cloNIDine (CATAPRES) 0.2 MG tablet TAKE 1 TABLET TWICE A DAY (Patient taking differently: TAKE 1 TABLET three  times A DAY) 180 tablet 2  . famotidine (PEPCID) 20 MG tablet One at bedtime 30 tablet 11  . gabapentin (NEURONTIN) 100 MG capsule TAKE 1 CAPSULE (100 MG TOTAL) BY MOUTH AT BEDTIME. 90 capsule 2  . glucose blood test strip Checks sugars 4 times daily, on meal time insulin 120 each 11  . insulin aspart (NOVOLOG FLEXPEN) 100 UNIT/ML FlexPen Inject 25-30 Units into the skin 3 (three) times daily before meals. 30 mL 11  . Insulin Degludec (TRESIBA FLEXTOUCH) 200 UNIT/ML SOPN Inject 40 Units into the skin at bedtime. 6 pen 11  . Insulin Pen Needle 32G X 4 MM MISC Use 4x a day 200 each 6  . metFORMIN (GLUCOPHAGE) 1000 MG tablet Take 1 tablet (1,000 mg total) by mouth 2 (two) times daily with a meal. 180 tablet 3  . ONETOUCH DELICA LANCETS 396PMISC 100 PENS BY DOES NOT APPLY ROUTE 2 (TWO) TIMES DAILY. 100 each 3  . pantoprazole (PROTONIX)  40 MG tablet Take 1 tablet (40 mg total) by mouth daily. Take 30-60 min before first meal of the day 30 tablet 2  . potassium chloride (KLOR-CON 10) 10 MEQ tablet Take 1 tablet (10 mEq total) by mouth daily. 90 tablet 3  . predniSONE (DELTASONE) 5 MG tablet TAKE 1 TABLET BY MOUTH EVERY DAY 100 tablet 0  . rosuvastatin (CRESTOR) 20 MG tablet TAKE 1 TABLET BY MOUTH EVERY DAY 90 tablet 2  . Semaglutide (OZEMPIC) 1 MG/DOSE SOPN Inject 1 mg into the skin once a week. 2 pen 5  . Vitamin D, Ergocalciferol, (DRISDOL) 50000 units CAPS capsule Take 1 capsule (50,000 Units total) by mouth every 7 (seven) days. 12 capsule 3   No current facility-administered medications on file prior to visit.    ROS as in subjective   Objective: BP (!) 142/92 (BP Location: Right Arm, Patient Position: Sitting, Cuff Size: Large)   Pulse 83   Ht 5' 8.5" (1.74 m)   Wt (!) 310 lb 6.4 oz (140.8 kg)   LMP  11/09/2013   SpO2 97%   BMI 46.51 kg/m   BP Readings from Last 3 Encounters:  05/20/17 (!) 142/92  05/03/17 132/86  03/21/17 (!) 150/98   Wt Readings from Last 3 Encounters:  05/20/17 (!) 310 lb 6.4 oz (140.8 kg)  05/03/17 (!) 309 lb (140.2 kg)  03/21/17 (!) 313 lb (142 kg)    General appearance: alert, no distress, WD/WN,  Oral cavity: MMM Neck: supple, no lymphadenopathy, enlarged thyroid/goiter, asymmetrically enlarged more on the right, no masses, no JVD, no bruits Heart: RRR, normal S1, S2, no murmurs Lungs: CTA bilaterally, no wheezes, rhonchi, or rales Ext: no edema Pulses: 2+ symmetric, upper and lower extremities, normal cap refill     Plan: Reviewed recent specialist notes.  She saw gynecology and had updated pap with Dr. Talbert Nan 03/21/2017.   Reviewed recent pap 03/2017.    She recent saw pulmonology Dr. Melvyn Novas in f/u on cough and Sarcoidosis, note reviewed from 05/03/2017.    She last saw endocrinology Dr. Cruzita Lederer 02/22/17.  Reviewed HgbA1C 9.8% through endocrinology from 02/2017.   Dr. Cruzita Lederer also commented on thyroid nodule.   Recommended repeat thyroid ultrasound this year.   She is due for f/u with endocrinology at this time.   She continues on Metformin 1070m BID, novolog 25-30 u QAC, c/t Tresiba 40u daily, and in January Ozempic was increased to 176mweekly.  Lipids: Last lipid 08/2016 with LDL 99, HDL 73, ratio 4.1 Continue Aspirin 8162mHS, Crestor 43m85mS.   Vit D deficiency : Low Vit D on lab 08/2016 Lab today.   Thyroid goiter, hx/o thyroid nodule - labs today, plan repeat [place]. [place] - continue Exforege 10/320/25mg73mly, Bisoprolol 10mg 87my, Clonidine 1 tablet TID.  Home readings are reportedly stable and improved compared to readings today.  Prior eva with Dr. HarwanTerrence Dupontf BPs not staying at goal, will need cardiology input.  Consider updated cardiology consult  Gout - compliant with Allopurinol 100mg d48m.   Lab today for uric acid.    Cherrelle  Vonnieeen today for diabetes.  Diagnoses and all orders for this visit:  Type 2 diabetes mellitus with hyperglycemia, with long-term current use of insulin (HCC) -     Uric acid -     Comprehensive metabolic panel  Thyroid nodule -     TSH -     T4, free -     CBC  Essential hypertension, benign -     Uric acid -     Comprehensive metabolic panel  Hyperlipidemia, unspecified hyperlipidemia type -     Comprehensive metabolic panel -     Lipid panel  Vitamin D deficiency -     VITAMIN D 25 Hydroxy (Vit-D Deficiency, Fractures)  Gout, unspecified cause, unspecified chronicity, unspecified site  Diffuse goiter

## 2017-05-21 ENCOUNTER — Other Ambulatory Visit: Payer: Self-pay | Admitting: Medical

## 2017-05-21 DIAGNOSIS — E041 Nontoxic single thyroid nodule: Secondary | ICD-10-CM

## 2017-05-21 LAB — COMPREHENSIVE METABOLIC PANEL
A/G RATIO: 1.5 (ref 1.2–2.2)
ALT: 25 IU/L (ref 0–32)
AST: 24 IU/L (ref 0–40)
Albumin: 4.3 g/dL (ref 3.5–5.5)
Alkaline Phosphatase: 59 IU/L (ref 39–117)
BUN/Creatinine Ratio: 14 (ref 9–23)
BUN: 14 mg/dL (ref 6–24)
Bilirubin Total: 0.4 mg/dL (ref 0.0–1.2)
CALCIUM: 9.7 mg/dL (ref 8.7–10.2)
CO2: 29 mmol/L (ref 20–29)
Chloride: 99 mmol/L (ref 96–106)
Creatinine, Ser: 0.98 mg/dL (ref 0.57–1.00)
GFR calc Af Amer: 76 mL/min/{1.73_m2} (ref >59)
GFR, EST NON AFRICAN AMERICAN: 66 mL/min/{1.73_m2} (ref >59)
GLOBULIN, TOTAL: 2.8 g/dL (ref 1.5–4.5)
Glucose: 162 mg/dL — ABNORMAL HIGH (ref 65–99)
POTASSIUM: 3.2 mmol/L — AB (ref 3.5–5.2)
Sodium: 143 mmol/L (ref 134–144)
TOTAL PROTEIN: 7.1 g/dL (ref 6.0–8.5)

## 2017-05-21 LAB — CBC
HEMOGLOBIN: 13.5 g/dL (ref 11.1–15.9)
Hematocrit: 41.1 % (ref 34.0–46.6)
MCH: 28.7 pg (ref 26.6–33.0)
MCHC: 32.8 g/dL (ref 31.5–35.7)
MCV: 87 fL (ref 79–97)
Platelets: 258 10*3/uL (ref 150–379)
RBC: 4.7 x10E6/uL (ref 3.77–5.28)
RDW: 13.7 % (ref 12.3–15.4)
WBC: 7.2 10*3/uL (ref 3.4–10.8)

## 2017-05-21 LAB — LIPID PANEL
CHOL/HDL RATIO: 3.4 ratio (ref 0.0–4.4)
CHOLESTEROL TOTAL: 156 mg/dL (ref 100–199)
HDL: 46 mg/dL (ref >39)
LDL CALC: 79 mg/dL (ref 0–99)
Triglycerides: 155 mg/dL — ABNORMAL HIGH (ref 0–149)
VLDL CHOLESTEROL CAL: 31 mg/dL (ref 5–40)

## 2017-05-21 LAB — TSH: TSH: 5.19 u[IU]/mL — ABNORMAL HIGH (ref 0.450–4.500)

## 2017-05-21 LAB — VITAMIN D 25 HYDROXY (VIT D DEFICIENCY, FRACTURES): VIT D 25 HYDROXY: 37.1 ng/mL (ref 30.0–100.0)

## 2017-05-21 LAB — T4, FREE: Free T4: 1.15 ng/dL (ref 0.82–1.77)

## 2017-05-21 LAB — URIC ACID: URIC ACID: 9.5 mg/dL — AB (ref 2.5–7.1)

## 2017-05-21 MED ORDER — BISOPROLOL FUMARATE 10 MG PO TABS: 10.0000 mg | ORAL_TABLET | Freq: Every day | ORAL | 3 refills | Status: DC

## 2017-05-21 MED ORDER — VITAMIN D 1000 UNITS PO TABS: 1000.0000 [IU] | ORAL_TABLET | Freq: Every day | ORAL | 3 refills | Status: DC

## 2017-05-21 MED ORDER — AMLODIPINE-VALSARTAN-HCTZ 10-320-25 MG PO TABS: 1.0000 | ORAL_TABLET | Freq: Every day | ORAL | 3 refills | Status: DC

## 2017-05-21 MED ORDER — ASPIRIN 81 MG PO TABS: 81.0000 mg | ORAL_TABLET | Freq: Every day | ORAL | 3 refills | Status: DC

## 2017-05-21 MED ORDER — POTASSIUM CHLORIDE ER 10 MEQ PO TBCR: 10.0000 meq | EXTENDED_RELEASE_TABLET | Freq: Two times a day (BID) | ORAL | 3 refills | Status: DC

## 2017-05-21 MED ORDER — ALLOPURINOL 100 MG PO TABS: 100.0000 mg | ORAL_TABLET | Freq: Every day | ORAL | 3 refills | Status: DC

## 2017-05-22 ENCOUNTER — Other Ambulatory Visit: Payer: Self-pay

## 2017-05-22 ENCOUNTER — Telehealth: Payer: Self-pay

## 2017-05-22 DIAGNOSIS — E041 Nontoxic single thyroid nodule: Secondary | ICD-10-CM

## 2017-05-22 NOTE — Telephone Encounter (Signed)
Called patient and advised of [place] appointment with [place]. April 22nd at 2:30, [address]. Patient understood and had no questions.

## 2017-05-24 ENCOUNTER — Ambulatory Visit: Payer: BC Managed Care – PPO | Admitting: Endocrinology

## 2017-05-27 ENCOUNTER — Ambulatory Visit
Admission: RE | Admit: 2017-05-27 | Discharge: 2017-05-27 | Disposition: A | Discharge status: Home / Self Care | Payer: BC Managed Care – PPO | Source: Ambulatory Visit | Attending: Medical | Admitting: Medical

## 2017-05-27 DIAGNOSIS — E041 Nontoxic single thyroid nodule: Secondary | ICD-10-CM

## 2017-06-03 NOTE — Progress Notes (Signed)
Pt has been informed the order for imaging has been placed. She was provided the number for imaging to schedule her appointment.

## 2017-06-04 ENCOUNTER — Encounter: Payer: Self-pay | Admitting: Endocrinology

## 2017-06-04 ENCOUNTER — Ambulatory Visit: Payer: BC Managed Care – PPO | Admitting: Endocrinology

## 2017-06-04 VITALS — BP 154/98 | HR 97 | Wt 308.6 lb

## 2017-06-04 DIAGNOSIS — Z794 Long term (current) use of insulin: Secondary | ICD-10-CM

## 2017-06-04 DIAGNOSIS — E1165 Type 2 diabetes mellitus with hyperglycemia: Secondary | ICD-10-CM

## 2017-06-04 LAB — POCT GLYCOSYLATED HEMOGLOBIN (HGB A1C): Hemoglobin A1C: 8.3

## 2017-06-04 MED ORDER — INSULIN ASPART 100 UNIT/ML FLEXPEN: 30.0000 [IU] | PEN_INJECTOR | Freq: Three times a day (TID) | SUBCUTANEOUS | 11 refills | Status: DC

## 2017-06-04 NOTE — Patient Instructions (Addendum)
Please increase the novolog to 30-35 units 3 times a day (just before each meal).  Please continue the same other diabetes medications.  Please come back for a follow-up appointment in 2 months.  check your blood sugar twice a day.  vary the time of day when you check, between before the 3 meals, and at bedtime.  also check if you have symptoms of your blood sugar being too high or too low.  please keep a record of the readings and bring it to your next appointment here (or you can bring the meter itself).  You can write it on any piece of paper.  please call us sooner if your blood sugar goes below 70, or if you have a lot of readings over 200. please do the thyroid biopsy as planned.  I would be happy to follow up on the result.

## 2017-06-04 NOTE — Progress Notes (Signed)
Subjective:    Patient ID: Amy Rosario, female    DOB: [birth year], 57 y.o.   MRN: [id number]  HPI Pt returns for f/u of diabetes mellitus: DM type: Insulin-requiring type 2.   Dx'ed: 2015.  Complications: none Therapy: insulin since 2017, ozempic, and metformin GDM: never DKA: never Severe hypoglycemia: never Pancreatitis: never.  Pancreatic imaging: normal on 2018 [place].  Other: she takes multiple daily injections.  Interval history: she takes intermitt prednisone for sarcoidosis--now 5 mg qd.  she brings a record of her cbg's which I have reviewed today.  It varies from 150-200.  She checks only fasting and after breakfast.  Pt takes meds as rx'ed.  Past Medical History:  Diagnosis Date  . Back pain   . Diabetes mellitus without complication ([place])   . Goiter    left side, saw dr tysinger april 2015 for, had biopsy done was told it is cyst  . H/O echocardiogram    Dr. Charolette Forward, [facility]  . Hyperlipidemia   . Hypertension 2000  . Impaired fasting blood sugar 2014  . Obesity   . Other screening mammogram    planned for 05/2013  . Routine gynecological examination    last pap 2012  . Sarcoid   . Sleep apnea    on CPAP setting of 2    Past Surgical History:  Procedure Laterality Date  . COLONOSCOPY WITH PROPOFOL N/A 09/21/2013   Procedure: COLONOSCOPY WITH PROPOFOL;  Surgeon: Juanita Craver, MD;  Location: [facility];  Service: Endoscopy;  Laterality: N/A;  . CYST EXCISION     right low back  . TUBAL LIGATION    . VIDEO BRONCHOSCOPY Bilateral 07/05/2016   Procedure: VIDEO BRONCHOSCOPY WITH FLUORO;  Surgeon: Tanda Rockers, MD;  Location: [facility];  Service: Endoscopy;  Laterality: Bilateral;    Social History   Socioeconomic History  . Marital status: Single    Spouse name: Not on file  . Number of children: Not on file  . Years of education: Not on file  . Highest education level: Not on file  Occupational History  . Not on  file  Social Needs  . Financial resource strain: Not on file  . Food insecurity:    Worry: Not on file    Inability: Not on file  . Transportation needs:    Medical: Not on file    Non-medical: Not on file  Tobacco Use  . Smoking status: Never Smoker  . Smokeless tobacco: Never Used  Substance and Sexual Activity  . Alcohol use: No  . Drug use: No  . Sexual activity: Not Currently    Partners: Male    Birth control/protection: Post-menopausal, None  Lifestyle  . Physical activity:    Days per week: Not on file    Minutes per session: Not on file  . Stress: Not on file  Relationships  . Social connections:    Talks on phone: Not on file    Gets together: Not on file    Attends religious service: Not on file    Active member of club or organization: Not on file    Attends meetings of clubs or organizations: Not on file    Relationship status: Not on file  . Intimate partner violence:    Fear of current or ex partner: Not on file    Emotionally abused: Not on file    Physically abused: Not on file    Forced sexual activity: Not on file  Other Topics Concern  . 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 allopurinol (ZYLOPRIM) 100 MG tablet Take 1 tablet (100 mg total) by mouth daily. 90 tablet 3  . amLODIPine-Valsartan-HCTZ (EXFORGE HCT) 10-320-25 MG TABS Take 1 tablet by mouth daily. 90 tablet 3  . aspirin 81 MG tablet Take 1 tablet (81 mg total) by mouth daily. 90 tablet 3  . benzonatate (TESSALON) 200 MG capsule Take 1 capsule (200 mg total) by mouth 3 (three) times daily as needed for cough. 45 capsule 5  . bisoprolol (ZEBETA) 10 MG tablet Take 1 tablet (10 mg total) by mouth daily. 90 tablet 3  . Blood Glucose Monitoring Suppl (ONE TOUCH  ULTRA 2) w/Device KIT 1 Device by Does not apply route 2 (two) times daily. 1 each 0  . cholecalciferol (VITAMIN D) 1000 units tablet Take 1 tablet (1,000 Units total) by mouth daily. 90 tablet 3  . cloNIDine (CATAPRES) 0.2 MG tablet TAKE 1 TABLET TWICE A DAY (Patient taking differently: TAKE 1 TABLET three  times A DAY) 180 tablet 2  . famotidine (PEPCID) 20 MG tablet One at bedtime 30 tablet 11  . gabapentin (NEURONTIN) 100 MG capsule TAKE 1 CAPSULE (100 MG TOTAL) BY MOUTH AT BEDTIME. 90 capsule 2  . glucose blood test strip Checks sugars 4 times daily, on meal time insulin 120 each 11  . Insulin Degludec (TRESIBA FLEXTOUCH) 200 UNIT/ML SOPN Inject 40 Units into the skin at bedtime. 6 pen 11  . Insulin Pen Needle 32G X 4 MM MISC Use 4x a day 200 each 6  . metFORMIN (GLUCOPHAGE) 1000 MG tablet Take 1 tablet (1,000 mg total) by mouth 2 (two) times daily with a meal. 180 tablet 3  . ONETOUCH DELICA LANCETS 63F MISC 100 PENS BY DOES NOT APPLY ROUTE 2 (TWO) TIMES DAILY. 100 each 3  . pantoprazole (PROTONIX) 40 MG tablet Take 1 tablet (40 mg total) by mouth daily. Take 30-60 min before first meal of the day 30 tablet 2  . potassium chloride (KLOR-CON 10) 10 MEQ tablet Take 1 tablet (10 mEq total) by mouth 2 (two) times daily. 180 tablet 3  . predniSONE (DELTASONE) 5 MG tablet TAKE 1 TABLET BY MOUTH EVERY DAY 100 tablet 0  . rosuvastatin (CRESTOR) 20 MG tablet TAKE 1 TABLET BY MOUTH EVERY DAY 90 tablet 2  . Semaglutide (OZEMPIC) 1 MG/DOSE SOPN Inject 1 mg into the skin once a week. 2 pen 5   No current facility-administered medications on file prior to visit.     Allergies  Allergen Reactions  . Penicillins Hives and Swelling    Has patient had a PCN reaction causing immediate rash, facial/tongue/throat swelling, SOB or lightheadedness with hypotension: Yes Has patient had a PCN reaction causing severe rash involving mucus membranes or skin necrosis: No Has patient had a PCN reaction that required  hospitalization: No Has patient had a PCN reaction occurring within the last 10 years: No If all of the above answers are "NO", then may proceed with Cephalosporin use.    Family History  Problem Relation Age of Onset  . Heart disease Father   . Hypertension Father   . Heart disease Brother   . Cancer Neg Hx   .  Stroke Neg Hx   . Diabetes Neg Hx     BP (!) 154/98   Pulse 97   Wt (!) 308 lb 9.6 oz (140 kg)   LMP 11/09/2013   SpO2 98%   BMI 46.24 kg/m    Review of Systems She denies hypoglycemia.      Objective:   Physical Exam  VITAL SIGNS:  See vs page GENERAL: no distress NECK: thyroid is enlarged, but I can't tell details Pulses: dorsalis pedis intact bilat.   MSK: no deformity of the feet CV: no leg edema Skin:  no ulcer on the feet.  normal color and temp on the feet. Neuro: sensation is intact to touch on the feet   Lab Results  Component Value Date   HGBA1C 8.3 06/04/2017   Lab Results  Component Value Date   TSH 5.190 (H) 05/20/2017   Lab Results  Component Value Date   CREATININE 0.98 05/20/2017   BUN 14 05/20/2017   NA 143 05/20/2017   K 3.2 (L) 05/20/2017   CL 99 05/20/2017   CO2 29 05/20/2017       Assessment & Plan:  Insulin-requiring type 2 DM: she needs increased rx Nodular thyroid, new to me.  She should have bx.     Patient Instructions  Please increase the novolog to 30-35 units 3 times a day (just before each meal).  Please continue the same other diabetes medications.  Please come back for a follow-up appointment in 2 months.  check your blood sugar twice a day.  vary the time of day when you check, between before the 3 meals, and at bedtime.  also check if you have symptoms of your blood sugar being too high or too low.  please keep a record of the readings and bring it to your next appointment here (or you can bring the meter itself).  You can write it on any piece of paper.  please call us sooner if your blood sugar goes below  70, or if you have a lot of readings over 200. please do the thyroid biopsy as planned.  I would be happy to follow up on the result.

## 2017-07-08 LAB — HM DIABETES EYE EXAM

## 2017-07-15 ENCOUNTER — Other Ambulatory Visit: Payer: Self-pay | Admitting: Internal Medicine

## 2017-08-05 ENCOUNTER — Ambulatory Visit: Payer: BC Managed Care – PPO | Admitting: Internal Medicine

## 2017-08-05 ENCOUNTER — Encounter: Payer: Self-pay | Admitting: Internal Medicine

## 2017-08-05 VITALS — BP 142/88 | HR 91 | Ht 67.0 in | Wt 305.0 lb

## 2017-08-05 DIAGNOSIS — D869 Sarcoidosis, unspecified: Secondary | ICD-10-CM | POA: Diagnosis not present

## 2017-08-05 DIAGNOSIS — R05 Cough: Secondary | ICD-10-CM

## 2017-08-05 DIAGNOSIS — R058 Other specified cough: Secondary | ICD-10-CM

## 2017-08-05 DIAGNOSIS — R0602 Shortness of breath: Secondary | ICD-10-CM | POA: Diagnosis not present

## 2017-08-05 NOTE — Progress Notes (Signed)
Subjective:     Patient ID: Amy Rosario, female   DOB: 05/21/1960     MRN: [id number]    Brief patient profile:  56 yobf with MO never smoker  Presented sob/abn lfts Feb 2018 > ? dx Sarcoid > referred to pulmonary clinic 04/18/2016 by Sean Tysinger PA  - FOB 07/05/16> severe diffuse cobblestoning :  Endobronchial bx:  NCG    Last worked in Mar 25 2016   History of Present Illness  04/18/2016 1st [facility] office visit/ Kwame Ryland   Chief Complaint  Patient presents with  . Pulmonary Consult    Referred by Dr. David Tysinger for eval of Sarcoidosis. Pt states she was just dxed recently. She started having SOB in mid Feb 2018.  She gets winded walking short distances such as to her mailbox and back.  She also c/o cough- prod at times with minimal yellow to clear sputum. She states she has recently noticed bumps on her face.   onset of sob was indolent really since first of 2018 assoc with abd swelling and  mostly dry cough then noted "red bumps" on face and some aching of elbows but not ankles or wrists. No viz symptoms/ some anorexia but min wt loss  Doe = MMRC1 = can walk nl pace, flat grade, can't hurry or go uphills or steps s sob   rec Sarcoidosis is a benign inflammatory condition  Treatment is generally reserved for patients with major symptoms  schedule dermatology for skin biopsy> neg for sarcoid       06/04/2016  f/u ov/Lyndell Gillyard re:  Sarcoidosis  Chief Complaint  Patient presents with  . Follow-up    4 wk f/u for sarcoidosis.   new problem = stiffness and swelling in hands / had skin bx but doesn't know her doctor's name that treats this "it's gout" No change doe = mmrc 1  rec Prednisone 10 mg take  4 each am x 2 days,   2 each am x 2 days,  1 each am x 2 days and stop  Please remember to go to the lab  department downstairs in the basement  for your tests - we will call you with the results when they are available. Stop tenormin and start bisoprolol  10 mg daily     07/03/2016  f/u ov/Ares Tegtmeyer re: sarcoidosis  Chief Complaint  Patient presents with  . Follow-up    Pt c/o stiffness in her neck in the am's.  She also c/o swelling in both hands.   much better on pred, much worse off in terms of all her arthritis symptoms esp L hand / wrist  Doe continues at level = MMRC1 rec - FOB 07/05/16> severe diffuse cobblestoning :  Endobronchial bx:  NCG rec pred 20 mg daily until better then 10 mg daily and f/u in one month   08/17/2016  f/u ov/Besnik Febus re:  Sarcoid with prominent airway involvement on pred 10 mg x one week s flare  Chief Complaint  Patient presents with  . Follow-up    Arthritic pain improved on pred. Breathing is doing well and no new co's today.    main symptom arthritis elbows/ hands resolved/  Not limited by breathing from desired activities  But not very active rec Take zebeta (10 mg) each am and continue clonidine 0.2 three times day  Prednisone ceiling of 20 mg per day and floor of 5 mg with breakfast          09/28/2016    f/u ov/Guilford Shannahan re: sarcoid / still on pred 10 mg daily/ did not try to taper ? Why  Chief Complaint  Patient presents with  . Follow-up    Breathing is doing well. No new co's today.   Not limited by breathing from desired activities  / no arthritis/ cough  Has not seen eye doctor  rec Please remember to go to the lab department downstairs in the basement  for your tests - we will call you with the results when they are available. If breathing or coughing worse, pred should be 20 mg per day otherwise taper to  5 mg daily  Patient coordinator   to schedule opthamology eval due to your sarcoidosis - we will be in touch     12/03/2016  f/u ov/Anuja Manka re: sarcoid / pred at 10 mg daily  Chief Complaint  Patient presents with  . Follow-up    Pt c/o breaking out in bumps all over her body. Denies any SOB, cough, or CP.  bumps started back 3 weeks prior to OV  , not itchy, very similar to previous lesion dx as bullous  impetigoneg for sarcoid per dermatology/ has not been back to Dr Delman Cheadle or taken any of the meds she prev used for the other cyst(included doxy) rec Doxycycline '100mg'$  twice daily x 10 days and follow with your dermatologist = Jari Pigg at [address]  Try prednisone 5 mg daily and see what difference it makes with cough or short of breath - ceiling is 20 mg daily and floor is 5 mg daily    01/21/2017  f/u ov/Darlina Mccaughey re: sarcoid  On pred at 10 mg daily - did not taper  Chief Complaint  Patient presents with  . Follow-up    Bumps resolved on doxy and then returned once she was completed course. She denies any new co's.   breathing is good, no cough/ joint stiffness  Has not seen derm, prior rx for doxy helped then recurred several weeks p completed course rec Prednisone ceiling is 20 mg per day, floor is 5 mg daily and if you do great on the 5 mg daily x for a month, try taking on even days Doxy 100 mg twice daily x 10 days and follow up with your dermatologist     05/03/2017  f/u ov/Leary Mcnulty re: sarcoid f/u 5 mg qod  Chief Complaint  Patient presents with  . Pulmonary follow up    productive Cough  clear in color since 04/29/17, Has felt like the right side of her neck is swollen and sore for since 04/26/17    Dyspnea: not limiting Cough: 04/29/17 acute onset p pfts-otherwise was doing fine  / cough is day > noct, min mucoid  Sleep: noct "wheeze" rec Prednisone ceiling is 20 mg per day until coughing / breathing better then taper to floor of 5 mg every other day  Pantoprazole (protonix) 40 mg   Take  30-60 min before first meal of the day and Pepcid (famotidine)  20 mg one @  bedtime until cough is gone for a week  Then try off GERD diet  For cough > tessalon 200 mg every 8 hours as needed     08/05/2017  f/u ov/Artez Regis re: sarcoidosis  On pred 5 mg daily (did not yet taper to 5 mg qod as rec)  Chief Complaint  Patient presents with  . Follow-up    Breathing has improved some since the last  visit.   dyspnea :  MMRC1 = can walk nl pace, flat grade, can't hurry or go uphills or steps s sob   Cough better    No obvious day to day or daytime variability or assoc excess/ purulent sputum or mucus plugs or hemoptysis or cp or chest tightness, subjective wheeze or overt sinus or hb symptoms.   Sleeps   Ok  1 pillow s wheeze without nocturnal  or early am exacerbation  of respiratory  c/o's or need for noct saba. Also denies any obvious fluctuation of symptoms with weather or environmental changes or other aggravating or alleviating factors except as outlined above   No unusual exposure hx or h/o childhood pna/ asthma or knowledge of premature birth.  Current Allergies, Complete Past Medical History, Past Surgical History, Family History, and Social History were reviewed in [employer] record.  ROS  The following are not active complaints unless bolded Hoarseness, sore throat, dysphagia, dental problems, itching, sneezing,  nasal congestion or discharge of excess mucus or purulent secretions, ear ache,   fever, chills, sweats, unintended wt loss or wt gain, classically pleuritic or exertional cp,  orthopnea pnd or arm/hand swelling  or leg swelling, presyncope, palpitations, abdominal pain, anorexia, nausea, vomiting, diarrhea  or change in bowel habits or change in bladder habits, change in stools or change in urine, dysuria, hematuria,  rash, arthralgias, visual complaints, headache, numbness, weakness or ataxia or problems with walking or coordination,  change in mood or  memory.        Current Meds  Medication Sig  . allopurinol (ZYLOPRIM) 100 MG tablet Take 1 tablet (100 mg total) by mouth daily.  Marland Kitchen amLODIPine-Valsartan-HCTZ (EXFORGE HCT) 10-320-25 MG TABS Take 1 tablet by mouth daily.  Marland Kitchen aspirin 81 MG tablet Take 1 tablet (81 mg total) by mouth daily.  . benzonatate (TESSALON) 200 MG capsule Take 1 capsule (200 mg total) by mouth 3 (three) times daily as  needed for cough.  . bisoprolol (ZEBETA) 10 MG tablet Take 1 tablet (10 mg total) by mouth daily.  . Blood Glucose Monitoring Suppl (ONE TOUCH ULTRA 2) w/Device KIT 1 Device by Does not apply route 2 (two) times daily.  . cholecalciferol (VITAMIN D) 1000 units tablet Take 1 tablet (1,000 Units total) by mouth daily.  . cloNIDine (CATAPRES) 0.2 MG tablet TAKE 1 TABLET TWICE A DAY (Patient taking differently: TAKE 1 TABLET three  times A DAY)  . famotidine (PEPCID) 20 MG tablet One at bedtime  . gabapentin (NEURONTIN) 100 MG capsule TAKE 1 CAPSULE (100 MG TOTAL) BY MOUTH AT BEDTIME.  Marland Kitchen glucose blood test strip Checks sugars 4 times daily, on meal time insulin  . insulin aspart (NOVOLOG FLEXPEN) 100 UNIT/ML FlexPen Inject 30-35 Units into the skin 3 (three) times daily before meals.  . Insulin Degludec (TRESIBA FLEXTOUCH) 200 UNIT/ML SOPN Inject 40 Units into the skin at bedtime.  . Insulin Pen Needle 32G X 4 MM MISC Use 4x a day  . metFORMIN (GLUCOPHAGE) 1000 MG tablet Take 1 tablet (1,000 mg total) by mouth 2 (two) times daily with a meal.  . ONETOUCH DELICA LANCETS 16L MISC 100 PENS BY DOES NOT APPLY ROUTE 2 (TWO) TIMES DAILY.  . pantoprazole (PROTONIX) 40 MG tablet Take 1 tablet (40 mg total) by mouth daily. Take 30-60 min before first meal of the day  . potassium chloride (KLOR-CON 10) 10 MEQ tablet Take 1 tablet (10 mEq total) by mouth 2 (two) times daily.  . predniSONE (DELTASONE) 5 MG  tablet Take 5 mg by mouth daily with breakfast.  . rosuvastatin (CRESTOR) 20 MG tablet TAKE 1 TABLET BY MOUTH EVERY DAY  . Semaglutide (OZEMPIC) 1 MG/DOSE SOPN Inject 1 mg into the skin once a week.                    Objective:   Physical Exam  amb obese bf nad  08/05/2017          305 05/03/2017        309  01/21/2017      312  12/03/2016      302  09/28/2016        290  08/17/2016        284  07/03/2016        276   06/04/2016       267   04/18/16 263 lb (119.3 kg)  03/26/16 264 lb 6.4 oz (119.9  kg)  03/20/16 263 lb (119.3 kg)   9/6/ 2017        295   Vital signs reviewed - Note on arrival 02 sats  95% on RA        HEENT: nl dentition, turbinates bilaterally, and oropharynx. Nl external ear canals without cough reflex   NECK :  without JVD/Nodes/  Massive TM R>> L trachea still midline   LUNGS: no acc muscle use,  Nl contour chest which is clear to A and P bilaterally without cough on insp or exp maneuvers   CV:  RRR  no s3 or murmur or increase in P2, and no edema   ABD:  Quite obese/ soft and nontender with nl inspiratory excursion in the supine position. No bruits or organomegaly appreciated, bowel sounds nl  MS:  Nl gait/ ext warm without deformities, calf tenderness, cyanosis or clubbing No obvious joint restrictions   SKIN: warm and dry without lesions    NEURO:  alert, approp, nl sensorium with  no motor or cerebellar deficits apparent.                    Assessment:

## 2017-08-05 NOTE — Patient Instructions (Signed)
Try prednisone 5 mg every other day if your breathing and cough are under good control and if they get worse ok to increase to a maxmum of 20 mg per day and then work back to the lowest dose where you are doing well   Please schedule a follow up office visit in 4 weeks, sooner if needed  with all medications /inhalers/ solutions in hand so we can verify exactly what you are taking. This includes all medications from all doctors and over the counters  PFTs on return

## 2017-08-06 ENCOUNTER — Encounter: Payer: Self-pay | Admitting: Internal Medicine

## 2017-08-06 NOTE — Assessment & Plan Note (Signed)
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 - ESR 06/04/2016  119 >  Prednisone 10 mg take  4 each am x 2 days,   2 each am x 2 days,  1 each am x 2 days and stop  - PFT's  06/04/2016  FVC  2.46 (73%)  No obst and no improvement from saba p nothing  prior to study with DLCO  64/63 % corrects to 105  % for alv volume   - ACE level 06/04/2016 = 260  - FOB 07/05/16> severe diffuse cobblestoning :  Endobronchial bx:  NCG rec pred 20 mg daily until better then 10 mg daily   - 08/17/2016 improved so taper to 5 mg daily as floor  - ESR 09/28/2016 down to 35/angiotensin 94  on 10 mg daily s symptoms> rec ceiling 20/ floor 5 mg daily  - referred to ophthalmology 09/28/2016 > neg  - PFT's  04/29/2017  FVC 2.26(67%)   with DLCO  67 % corrects to 116  % for alv volume  And ERV 46%   No evidence of active sarcoid at present.  The goal with a chronic steroid dependent illness is always arriving at the lowest effective dose that controls the disease/symptoms and not accepting a set "formula" which is based on statistics or guidelines that don't always take into account patient  variability or the natural hx of the dz in every individual patient, which may well vary over time.  For now therefore I recommend the patient maintain  20 mg ceiling and 5 mg qod as floor and return for f/u pfts on this dose (looking for upper airway issues also from TM but no stridor on exam and last pfts 04/2017 reviewed and no Upper airway obstruction suggested.

## 2017-08-06 NOTE — Assessment & Plan Note (Signed)
Better with GERD rx/ still concerned about TM contribution > f/u by Endocrinology/ pcp - will do f/v loop on return to be sure no airway obstruction related to TM

## 2017-08-06 NOTE — Assessment & Plan Note (Signed)
ERV 04/29/17 = 46% c/w body habitus   Body mass index is 47.77 kg/m.  -  trending down slightly/ encouraged  Lab Results  Component Value Date   TSH 5.190 (H) 05/20/2017     Contributing to gerd risk/ doe/reviewed the need and the process to achieve and maintain neg calorie balance > defer f/u primary care including intermittently monitoring thyroid status

## 2017-08-13 ENCOUNTER — Telehealth: Payer: Self-pay | Admitting: Internal Medicine

## 2017-08-13 NOTE — Telephone Encounter (Signed)
Forms received by MW and last OV note printed, and left up front for pickup.  Pt aware.  Nothing further needed.

## 2017-08-13 NOTE — Telephone Encounter (Signed)
Done- also needs a copy of my last ov

## 2017-08-13 NOTE — Telephone Encounter (Signed)
Spoke with pt, following up on her disability paperwork that was left with MW at last OV.  Pt states she has to turn this in tomorrow, would like to pick this up today if possible. Paperwork is on top of MW's stack above his dictation desk.  MW please advise when paperwork has been completed- pt requesting asap.  Thanks!

## 2017-08-28 ENCOUNTER — Telehealth: Payer: Self-pay | Admitting: Medical

## 2017-08-28 NOTE — Telephone Encounter (Signed)
Left message on voicemail for patient to call back. 

## 2017-08-28 NOTE — Telephone Encounter (Signed)
I received order request [facility]  Please call and see if she requested this?  She is seeing a diabetes specialist now and they should be refilling her testing supplies  I wonder if this [facility] request is a scam?

## 2017-08-29 NOTE — Telephone Encounter (Signed)
Left message on voicemail for patient to call back. 

## 2017-09-06 ENCOUNTER — Telehealth: Payer: Self-pay

## 2017-09-06 NOTE — Telephone Encounter (Signed)
Left message for patient to call back to verify if she requested diabetic testing supplies from [place].  She goes to Endocrinology so they are the ones who refill her supplies.

## 2017-09-24 ENCOUNTER — Telehealth: Payer: Self-pay | Admitting: Internal Medicine

## 2017-09-24 NOTE — Telephone Encounter (Signed)
Forms have been placed in MW's look at.

## 2017-09-25 NOTE — Telephone Encounter (Signed)
Ask her what days she missed work (or is still missing work) due to sarcoidosis dx

## 2017-09-25 NOTE — Telephone Encounter (Signed)
ATC pt, no answer. Left message for pt to call back.  

## 2017-09-26 NOTE — Telephone Encounter (Signed)
Forms have been retrieved from [employer]. Forms along with a copy of her last OV note have been placed up front for pick up. Pt is aware and was very appreciative. Nothing further was needed.

## 2017-09-26 NOTE — Telephone Encounter (Signed)
Patient called stating she have been out of work from 03/23/2016 to Present; pt contact # [phone]

## 2017-09-26 NOTE — Telephone Encounter (Signed)
Attempted to call pt. I did not receive an answer. I have left a message for pt to return our call.  

## 2017-09-26 NOTE — Telephone Encounter (Signed)
Completed 09/26/2017

## 2017-09-26 NOTE — Telephone Encounter (Signed)
Pt has been out of work since 03/23/2016 and is still currently out of work.  Forwarding to MW as Juluis Rainier

## 2017-10-03 ENCOUNTER — Telehealth: Payer: Self-pay | Admitting: Internal Medicine

## 2017-10-03 NOTE — Telephone Encounter (Signed)
Attempted to contact [employer]. The phone number left is for [employer]. I called [employer] at [phone]. Due to heavy call volumes, I was instructed to call back later by an automated system.

## 2017-10-04 NOTE — Telephone Encounter (Signed)
LMOM TCB x1 for Amy Rosario - voicemail did not have a business' name on it, only [name].  Did not leave any patient information on voicemail.  Did call and speak with patient to see if she ended up seeing [employer] on her forms and informed her that [name]'s VM did not state what company she is with.  Patient stated that disability forms provided to her by [facility] do not have a company name on them.  Advised pt will keep her updated.

## 2017-10-04 NOTE — Telephone Encounter (Signed)
Patient returned phone call. °

## 2017-10-04 NOTE — Telephone Encounter (Signed)
Spoke with patient. She stated that she is currently not at home to look at her paperwork. She will call back once she gets home. At this moment, [employer] does not seem familiar to her.

## 2017-10-04 NOTE — Telephone Encounter (Signed)
Patient returned phone call; unable to find case number; can contact [employer] [phone] about case number.

## 2017-10-04 NOTE — Telephone Encounter (Signed)
Spoke with a rep from [employer] and they cannot locate any claim on file for this patient. Left voicemail asking that patient give [place] a call.

## 2017-10-08 ENCOUNTER — Other Ambulatory Visit: Payer: Self-pay | Admitting: Medical

## 2017-10-08 NOTE — Telephone Encounter (Signed)
LMTCB for [employer]. Did not leave detailed message on phone due to no business name given.

## 2017-10-08 NOTE — Telephone Encounter (Signed)
Left message on voicemail for patient to call back to schedule appointment for CPE 

## 2017-10-09 ENCOUNTER — Other Ambulatory Visit: Payer: Self-pay | Admitting: Internal Medicine

## 2017-10-09 NOTE — Telephone Encounter (Signed)
LMTCB for [employer].

## 2017-10-10 NOTE — Telephone Encounter (Signed)
Please refill x 1 Ov is due  

## 2017-10-10 NOTE — Telephone Encounter (Signed)
Is she your pt or Loanne Drilling? Please advise

## 2017-10-10 NOTE — Telephone Encounter (Signed)
Called and spoke to Marlowe Sax who works with disability/LOA for [employer]. She stated that everything that she can see is done but she manages the long term. For short term claims that is through [employer] and she gave me the number [phone] to call.  I called and spoke to the representative that had questions about the paperwork. I was unable to locate the paperwork so I answered to the best of my ability with information from last OV note.  A copy of last OV note was requested and faxed to [phone].  Nothing further is needed at this time.

## 2017-10-10 NOTE — Telephone Encounter (Signed)
It looks like she transferred to [facility].

## 2017-10-10 NOTE — Telephone Encounter (Signed)
Ok to fill 

## 2017-10-18 ENCOUNTER — Other Ambulatory Visit: Payer: Self-pay | Admitting: Medical

## 2017-10-25 ENCOUNTER — Other Ambulatory Visit: Payer: Self-pay

## 2017-10-25 ENCOUNTER — Telehealth: Payer: Self-pay | Admitting: Medical

## 2017-10-25 MED ORDER — GLUCOSE BLOOD VI STRP: ORAL_STRIP | 11 refills | Status: DC

## 2017-10-25 NOTE — Telephone Encounter (Signed)
done

## 2017-10-25 NOTE — Telephone Encounter (Signed)
Recv'd fax from [facility] pt needs refill One Touch Verio test strips #100 with 12 refills

## 2017-11-04 ENCOUNTER — Other Ambulatory Visit: Payer: Self-pay | Admitting: Medical

## 2017-11-05 ENCOUNTER — Ambulatory Visit: Payer: BC Managed Care – PPO | Admitting: Internal Medicine

## 2017-11-12 ENCOUNTER — Other Ambulatory Visit: Payer: Self-pay | Admitting: Endocrinology

## 2017-11-26 ENCOUNTER — Other Ambulatory Visit: Payer: Self-pay | Admitting: Medical

## 2017-12-05 ENCOUNTER — Other Ambulatory Visit: Payer: Self-pay | Admitting: Endocrinology

## 2018-01-03 ENCOUNTER — Ambulatory Visit: Payer: BC Managed Care – PPO | Admitting: Internal Medicine

## 2018-01-15 ENCOUNTER — Ambulatory Visit (INDEPENDENT_AMBULATORY_CARE_PROVIDER_SITE_OTHER): Payer: BC Managed Care – PPO | Admitting: Internal Medicine

## 2018-01-15 ENCOUNTER — Encounter: Payer: Self-pay | Admitting: Internal Medicine

## 2018-01-15 VITALS — BP 122/80 | HR 89 | Ht 68.75 in | Wt 304.0 lb

## 2018-01-15 DIAGNOSIS — E040 Nontoxic diffuse goiter: Secondary | ICD-10-CM

## 2018-01-15 DIAGNOSIS — Z23 Encounter for immunization: Secondary | ICD-10-CM

## 2018-01-15 DIAGNOSIS — D869 Sarcoidosis, unspecified: Secondary | ICD-10-CM | POA: Diagnosis not present

## 2018-01-15 DIAGNOSIS — R0602 Shortness of breath: Secondary | ICD-10-CM | POA: Diagnosis not present

## 2018-01-15 DIAGNOSIS — E049 Nontoxic goiter, unspecified: Secondary | ICD-10-CM

## 2018-01-15 LAB — PULMONARY FUNCTION TEST
DL/VA % pred: 117 %
DL/VA: 6.26 ml/min/mmHg/L
DLCO unc % pred: 68 %
DLCO unc: 21.01 ml/min/mmHg
FEF 25-75 Post: 2.62 L/sec
FEF 25-75 Pre: 2.59 L/sec
FEF2575-%CHANGE-POST: 1 %
FEF2575-%Pred-Post: 100 %
FEF2575-%Pred-Pre: 99 %
FEV1-%CHANGE-POST: 0 %
FEV1-%PRED-PRE: 76 %
FEV1-%Pred-Post: 76 %
FEV1-Post: 2.04 L
FEV1-Pre: 2.02 L
FEV1FVC-%Change-Post: 4 %
FEV1FVC-%PRED-PRE: 108 %
FEV6-%CHANGE-POST: -2 %
FEV6-%Pred-Post: 68 %
FEV6-%Pred-Pre: 70 %
FEV6-POST: 2.24 L
FEV6-PRE: 2.3 L
FEV6FVC-%CHANGE-POST: 0 %
FEV6FVC-%PRED-POST: 102 %
FEV6FVC-%PRED-PRE: 102 %
FVC-%Change-Post: -3 %
FVC-%Pred-Post: 66 %
FVC-%Pred-Pre: 69 %
FVC-PRE: 2.31 L
FVC-Post: 2.24 L
PRE FEV1/FVC RATIO: 87 %
Post FEV1/FVC ratio: 91 %
Post FEV6/FVC ratio: 100 %
Pre FEV6/FVC Ratio: 100 %
RV % pred: 61 %
RV: 1.3 L
TLC % pred: 63 %
TLC: 3.67 L

## 2018-01-15 MED ORDER — PREDNISONE 2.5 MG PO TABS: 2.5000 mg | ORAL_TABLET | Freq: Every day | ORAL | 0 refills | Status: DC

## 2018-01-15 NOTE — [facility] Notes (Signed)
Subjective:     Patient ID: Amy Rosario, female   DOB: 02/26/1960     MRN: [id number]    Brief patient profile:  30 yobf with MO never smoker  Presented sob/abn lfts Feb 2018 > ? dx Sarcoid > referred to pulmonary clinic 04/18/2016 by Elray Mcgregor PA  - FOB 07/05/16> severe diffuse cobblestoning :  Endobronchial bx:  NCG    Last worked in Mar 25 2016   History of Present Illness  04/18/2016 1st [facility] office visit/ Oma Marzan   Chief Complaint  Patient presents with  . Pulmonary Consult    Referred by Dr. Chana Bode for eval of Sarcoidosis. Pt states she was just dxed recently. She started having SOB in mid Feb 2018.  She gets winded walking short distances such as to her mailbox and back.  She also c/o cough- prod at times with minimal yellow to clear sputum. She states she has recently noticed bumps on her face.   onset of sob was indolent really since first of 2018 assoc with abd swelling and  mostly dry cough then noted "red bumps" on face and some aching of elbows but not ankles or wrists. No viz symptoms/ some anorexia but min wt loss  Doe = MMRC1 = can walk nl pace, flat grade, can't hurry or go uphills or steps s sob   rec Sarcoidosis is a benign inflammatory condition  Treatment is generally reserved for patients with major symptoms  schedule dermatology for skin biopsy> neg for sarcoid     07/03/2016  f/u ov/Kilan Banfill re: sarcoidosis  Chief Complaint  Patient presents with  . Follow-up    Pt c/o stiffness in her neck in the am's.  She also c/o swelling in both hands.   much better on pred, much worse off in terms of all her arthritis symptoms esp L hand / wrist  Doe continues at level = MMRC1 rec - FOB 07/05/16> severe diffuse cobblestoning :  Endobronchial bx:  NCG rec pred 20 mg daily until better then 10 mg daily and f/u in one month   08/17/2016  f/u ov/Avnoor Koury re:  Sarcoid with prominent airway involvement on pred 10 mg x one week s flare  Chief Complaint  Patient  presents with  . Follow-up    Arthritic pain improved on pred. Breathing is doing well and no new co's today.    main symptom arthritis elbows/ hands resolved/  Not limited by breathing from desired activities  But not very active rec Take zebeta (10 mg) each am and continue clonidine 0.2 three times day  Prednisone ceiling of 20 mg per day and floor of 5 mg with breakfast      05/03/2017  f/u ov/Lynna Zamorano re: sarcoid f/u 5 mg qod  Chief Complaint  Patient presents with  . Pulmonary follow up    productive Cough  clear in color since 04/29/17, Has felt like the right side of her neck is swollen and sore for since 04/26/17    Dyspnea: not limiting Cough: 04/29/17 acute onset p pfts-otherwise was doing fine  / cough is day > noct, min mucoid  Sleep: noct "wheeze" rec Prednisone ceiling is 20 mg per day until coughing / breathing better then taper to floor of 5 mg every other day  Pantoprazole (protonix) 40 mg   Take  30-60 min before first meal of the day and Pepcid (famotidine)  20 mg one @  bedtime until cough is gone for a week  Then  try off GERD diet  For cough > tessalon 200 mg every 8 hours as needed     08/05/2017  f/u ov/Humzah Harty re: sarcoidosis  On pred 5 mg daily (did not yet taper to 5 mg qod as rec)  Chief Complaint  Patient presents with  . Follow-up    Breathing has improved some since the last visit.   dyspnea :   MMRC1 = can walk nl pace, flat grade, can't hurry or go uphills or steps s sob   Cough better  rec Try prednisone 5 mg every other day if your breathing and cough are under good control and if they get worse ok to increase to a maxmum of 20 mg per day and then work back to the lowest dose where you are doing well  Please schedule a follow up office visit in 4 weeks, sooner if needed  with all medications /inhalers/ solutions in hand so we can verify exactly what you are taking. This includes all medications from all doctors and over the counters  PFTs on return      01/15/2018  f/u ov/Aline Wesche re: sarcoidosis on 5 mg qod/no meds Chief Complaint  Patient presents with  . Follow-up    PFT's done today. Breathing is about the same. She has good days and bad days.    Dyspnea:  Walking 4mn  3 x weekly stops half way due to sob   Cough: min / dry / / tessalon works  Sleeping: bed flat/ 2 pillows  SABA use: no  02:  Has concentrator 2lpm  X years did not disclose this previously  No obvious day to day or daytime variability or assoc excess/ purulent sputum or mucus plugs or hemoptysis or cp or chest tightness, subjective wheeze or overt sinus or hb symptoms.   Sleeps as above  without nocturnal  or early am exacerbation  of respiratory  c/o's or need for noct saba. Also denies any obvious fluctuation of symptoms with weather or environmental changes or other aggravating or alleviating factors except as outlined above   No unusual exposure hx or h/o childhood pna/ asthma or knowledge of premature birth.  Current Allergies, Complete Past Medical History, Past Surgical History, Family History, and Social History were reviewed in [employer].  ROS  The following are not active complaints unless bolded Hoarseness, sore throat, dysphagia, dental problems, itching, sneezing,  nasal congestion or discharge of excess mucus or purulent secretions, ear ache,   fever, chills, sweats, unintended wt loss or wt gain, classically pleuritic or exertional cp,  orthopnea pnd or arm/hand swelling  or leg swelling, presyncope, palpitations, abdominal pain, anorexia, nausea, vomiting, diarrhea  or change in bowel habits or change in bladder habits, change in stools or change in urine, dysuria, hematuria,  rash, arthralgias, visual complaints, headache, numbness, weakness or ataxia or problems with walking or coordination,  change in mood or  memory.        Current Meds  Medication Sig  . allopurinol (ZYLOPRIM) 100 MG tablet Take 1 tablet (100 mg  total) by mouth daily.  .Marland KitchenamLODIPine-Valsartan-HCTZ (EXFORGE HCT) 10-320-25 MG TABS Take 1 tablet by mouth daily.  .Marland Kitchenaspirin 81 MG tablet Take 1 tablet (81 mg total) by mouth daily.  . benzonatate (TESSALON) 200 MG capsule Take 1 capsule (200 mg total) by mouth 3 (three) times daily as needed for cough.  . bisoprolol (ZEBETA) 10 MG tablet Take 1 tablet (10 mg total) by mouth daily.  .Marland Kitchen  Blood Glucose Monitoring Suppl (ONE TOUCH ULTRA 2) w/Device KIT 1 Device by Does not apply route 2 (two) times daily.  . cholecalciferol (VITAMIN D) 1000 units tablet Take 1 tablet (1,000 Units total) by mouth daily.  . cloNIDine (CATAPRES) 0.2 MG tablet TAKE 1 TABLET TWICE A DAY (Patient taking differently: TAKE 1 TABLET three  times A DAY)  . famotidine (PEPCID) 20 MG tablet One at bedtime  . gabapentin (NEURONTIN) 100 MG capsule TAKE 1 CAPSULE (100 MG TOTAL) BY MOUTH AT BEDTIME.  Marland Kitchen glucose blood test strip Checks sugars 4 times daily, on meal time insulin  . insulin aspart (NOVOLOG FLEXPEN) 100 UNIT/ML FlexPen Inject 30-35 Units into the skin 3 (three) times daily before meals.  . Insulin Degludec (TRESIBA FLEXTOUCH) 200 UNIT/ML SOPN Inject 40 Units into the skin at bedtime.  . Insulin Pen Needle 32G X 4 MM MISC Use 4x a day  . metFORMIN (GLUCOPHAGE) 1000 MG tablet Take 1 tablet (1,000 mg total) by mouth 2 (two) times daily with a meal.  . ONETOUCH DELICA LANCETS 26V MISC 100 PENS BY DOES NOT APPLY ROUTE 2 (TWO) TIMES DAILY.  Marland Kitchen OZEMPIC, 1 MG/DOSE, 2 MG/1.5ML SOPN INJECT 1 MG INTO THE SKIN ONCE A WEEK.  . pantoprazole (PROTONIX) 40 MG tablet Take 1 tablet (40 mg total) by mouth daily. Take 30-60 min before first meal of the day  . potassium chloride (KLOR-CON 10) 10 MEQ tablet Take 1 tablet (10 mEq total) by mouth 2 (two) times daily.  . rosuvastatin (CRESTOR) 20 MG tablet TAKE 1 TABLET BY MOUTH EVERY DAY  . [DISCONTINUED] predniSONE (DELTASONE) 5 MG tablet Take 5 mg by mouth every other day.             Objective:   Physical Exam  amb obese bf nad   01/15/2018      304  08/05/2017          305 05/03/2017        309  01/21/2017      312  12/03/2016      302  09/28/2016        290  08/17/2016        284  07/03/2016        276   06/04/2016       267   04/18/16 263 lb (119.3 kg)  03/26/16 264 lb 6.4 oz (119.9 kg)  03/20/16 263 lb (119.3 kg)   9/6/ 2017        295      Vital signs reviewed - Note on arrival 02 sats  98% on RA          HEENT: nl dentition, turbinates bilaterally, and oropharynx. 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 No bruits or organomegaly appreciated, bowel sounds nl  MS:  Nl gait/ ext warm without deformities, calf tenderness, cyanosis or clubbing No obvious joint restrictions   SKIN: warm and dry without lesions    NEURO:  alert, approp, nl sensorium with  no  motor or cerebellar deficits apparent.         Assessment:

## 2018-01-15 NOTE — Progress Notes (Signed)
PFT done today. 

## 2018-01-15 NOTE — Patient Instructions (Signed)
Prednsione 5 mg alternating 2.5 mg every other day  thru the holidays then 2.5 mg every other day for a month then stop it - the lowest dose that works is right dose   Please schedule a follow up office visit in 8 weeks, call sooner if needed with cxr on return

## 2018-01-17 ENCOUNTER — Telehealth: Payer: Self-pay | Admitting: Endocrinology

## 2018-01-17 ENCOUNTER — Encounter: Payer: Self-pay | Admitting: Internal Medicine

## 2018-01-17 ENCOUNTER — Telehealth: Payer: Self-pay | Admitting: Medical

## 2018-01-17 NOTE — Telephone Encounter (Signed)
Called pt and scheduled appt for 02/20/18 @ 9:15am

## 2018-01-17 NOTE — Assessment & Plan Note (Signed)
bx 07/02/13 c/w benign goiter  - f/v loop 01/15/2018 s UAO pattern   Asymptomatic > f/u pcp/ endocrine prn

## 2018-01-17 NOTE — Telephone Encounter (Signed)
please call patient: Needs f/u appt next available.  

## 2018-01-17 NOTE — Assessment & Plan Note (Signed)
ERV 04/29/17 = 46% c/w body habitus    Body mass index is 45.22 kg/m.  -  Trending no change  Lab Results  Component Value Date   TSH 5.190 (H) 05/20/2017     Contributing to gerd risk/ doe/reviewed the need and the process to achieve and maintain neg calorie balance > defer f/u primary care including intermittently monitoring thyroid status      I had an extended discussion with the patient reviewing all relevant studies completed to date and  lasting 15 to 20 minutes of a 25 minute visit    Each maintenance medication was reviewed in detail including most importantly the difference between maintenance and prns and under what circumstances the prns are to be triggered using an action plan format that is not reflected in the computer generated alphabetically organized AVS.     Please see AVS for specific instructions unique to this visit that I personally wrote and verbalized to the the pt in detail and then reviewed with pt  by my nurse highlighting any  changes in therapy recommended at today's visit to their plan of care.

## 2018-01-17 NOTE — Assessment & Plan Note (Signed)
Labs 03/08/16 :  Ca 12.4 with TP 8.8 and alb 3.9  - Stopped working 03/23/16 (notified pulmonary office 09/26/2017  And requested paperwork completion) ESR 04/18/2016 =  90  - Derm eval rec  04/19/2016   > done 05/31/16  Dx bulous impetigo / neg for NCG - ESR 06/04/2016  119 >  Prednisone 10 mg take  4 each am x 2 days,   2 each am x 2 days,  1 each am x 2 days and stop  - PFT's  06/04/2016  FVC  2.46 (73%)  No obst and no improvement from saba p nothing  prior to study with DLCO  64/63 % corrects to 105  % for alv volume   - ACE level 06/04/2016 = 260  - FOB 07/05/16> severe diffuse cobblestoning :  Endobronchial bx:  NCG rec pred 20 mg daily until better then 10 mg daily   - 08/17/2016 improved so taper to 5 mg daily as floor  - ESR 09/28/2016 down to 35/angiotensin 94  on 10 mg daily s symptoms> rec ceiling 20/ floor 5 mg daily  - referred to ophthalmology 09/28/2016 > neg  - PFT's  04/29/2017  FVC 2.26(67%)   with DLCO  67 % corrects to 116  % for alv volume  And ERV 46%  - PFT's  01/15/2018  FVC 2.31 (69%)  no obst  With nothing prior to study with DLCO  68 % corrects to 117  % for alv volume on 5 mg qod so rec taper to 2.5 qod    No evidence of active sarcoid or adrenal insuff on 5 mg qod so need to gradually taper off over next 6 week then return at 8 week for recheck .

## 2018-01-17 NOTE — Telephone Encounter (Signed)
Please get her on the schedule for follow-up on blood pressure and chronic issues, and she has appointment with endocrinology upcoming as well in January.

## 2018-01-20 NOTE — Telephone Encounter (Signed)
Pt scheduled appt

## 2018-01-20 NOTE — Telephone Encounter (Signed)
Left voicemail with pt that shane needed to see her

## 2018-02-12 ENCOUNTER — Other Ambulatory Visit: Payer: Self-pay | Admitting: Internal Medicine

## 2018-02-12 MED ORDER — PREDNISONE 2.5 MG PO TABS: 2.5000 mg | ORAL_TABLET | Freq: Every day | ORAL | 0 refills | Status: DC

## 2018-02-20 ENCOUNTER — Encounter: Payer: Self-pay | Admitting: Endocrinology

## 2018-02-20 ENCOUNTER — Ambulatory Visit: Payer: BC Managed Care – PPO | Admitting: Endocrinology

## 2018-02-20 VITALS — BP 140/88 | HR 92 | Ht 68.75 in | Wt 308.2 lb

## 2018-02-20 DIAGNOSIS — E1165 Type 2 diabetes mellitus with hyperglycemia: Secondary | ICD-10-CM

## 2018-02-20 DIAGNOSIS — Z794 Long term (current) use of insulin: Secondary | ICD-10-CM | POA: Diagnosis not present

## 2018-02-20 MED ORDER — METFORMIN HCL 1000 MG PO TABS: 1000.0000 mg | ORAL_TABLET | Freq: Two times a day (BID) | ORAL | 3 refills | Status: DC

## 2018-02-20 MED ORDER — INSULIN DEGLUDEC 200 UNIT/ML ~~LOC~~ SOPN: 130.0000 [IU] | PEN_INJECTOR | Freq: Every day | SUBCUTANEOUS | 11 refills | Status: DC

## 2018-02-20 NOTE — [facility] Notes (Signed)
Subjective:    Patient ID: Amy Rosario, female    DOB: March 10, 1960, 58 y.o.   MRN: [id number]  HPI Pt returns for f/u of diabetes mellitus: DM type: Insulin-requiring type 2.   Dx'ed: 2015.  Complications: none Therapy: insulin since 2017, ozempic, and metformin GDM: never DKA: never Severe hypoglycemia: never Pancreatitis: never.  Pancreatic imaging: normal on 2018 [place].  Other: she takes QD insulin, after poor results with multiple daily injections; she takes intermitt prednisone for sarcoidosis Interval history: Pt says she sometimes misses the insulin.  pt states she feels well in general.   Past Medical History:  Diagnosis Date  . Back pain   . Diabetes mellitus without complication ([place])   . Goiter    left side, saw dr tysinger april 2015 for, had biopsy done was told it is cyst  . H/O echocardiogram    Dr. Charolette Forward, [facility]  . Hyperlipidemia   . Hypertension 2000  . Impaired fasting blood sugar 2014  . Obesity   . Other screening mammogram    planned for 05/2013  . Routine gynecological examination    last pap 2012  . Sarcoid   . Sleep apnea    on CPAP setting of 2    Past Surgical History:  Procedure Laterality Date  . COLONOSCOPY WITH PROPOFOL N/A 09/21/2013   Procedure: COLONOSCOPY WITH PROPOFOL;  Surgeon: Juanita Craver, MD;  Location: [facility];  Service: Endoscopy;  Laterality: N/A;  . CYST EXCISION     right low back  . TUBAL LIGATION    . VIDEO BRONCHOSCOPY Bilateral 07/05/2016   Procedure: VIDEO BRONCHOSCOPY WITH FLUORO;  Surgeon: Tanda Rockers, MD;  Location: [facility];  Service: Endoscopy;  Laterality: Bilateral;    Social History   Socioeconomic History  . Marital status: Single    Spouse name: Not on file  . Number of children: Not on file  . Years of education: Not on file  . Highest education level: Not on file  Occupational History  . Not on file  Social Needs  . Financial resource strain: Not on file   . Food insecurity:    Worry: Not on file    Inability: Not on file  . Transportation needs:    Medical: Not on file    Non-medical: Not on file  Tobacco Use  . Smoking status: Never Smoker  . Smokeless tobacco: Never Used  Substance and Sexual Activity  . Alcohol use: No  . Drug use: No  . Sexual activity: Not Currently    Partners: Male    Birth control/protection: Post-menopausal, None  Lifestyle  . Physical activity:    Days per week: Not on file    Minutes per session: Not on file  . Stress: Not on file  Relationships  . Social connections:    Talks on phone: Not on file    Gets together: Not on file    Attends religious service: Not on file    Active member of club or organization: Not on file    Attends meetings of clubs or organizations: Not on file    Relationship status: Not on file  . Intimate partner violence:    Fear of current or ex partner: Not on file    Emotionally abused: Not on file    Physically abused: Not on file    Forced sexual activity: Not on file  Other Topics Concern  . Not on file  Social History Narrative  Lives with sister [name], single, 3 sons, Exercise -walks a lot on the job, Consulting civil engineer at [employer], visit different churches    Current Outpatient Medications on File Prior to Visit  Medication Sig Dispense Refill  . allopurinol (ZYLOPRIM) 100 MG tablet Take 1 tablet (100 mg total) by mouth daily. 90 tablet 3  . amLODIPine-Valsartan-HCTZ (EXFORGE HCT) 10-320-25 MG TABS Take 1 tablet by mouth daily. 90 tablet 3  . aspirin 81 MG tablet Take 1 tablet (81 mg total) by mouth daily. 90 tablet 3  . benzonatate (TESSALON) 200 MG capsule Take 1 capsule (200 mg total) by mouth 3 (three) times daily as needed for cough. 45 capsule 5  . bisoprolol (ZEBETA) 10 MG tablet Take 1 tablet (10 mg total) by mouth daily. 90 tablet 3  . Blood Glucose Monitoring Suppl (ONE TOUCH ULTRA 2) w/Device KIT 1 Device by Does not apply route 2 (two)  times daily. 1 each 0  . cholecalciferol (VITAMIN D) 1000 units tablet Take 1 tablet (1,000 Units total) by mouth daily. 90 tablet 3  . cloNIDine (CATAPRES) 0.2 MG tablet TAKE 1 TABLET TWICE A DAY (Patient taking differently: TAKE 1 TABLET three  times A DAY) 180 tablet 2  . famotidine (PEPCID) 20 MG tablet One at bedtime 30 tablet 11  . gabapentin (NEURONTIN) 100 MG capsule TAKE 1 CAPSULE (100 MG TOTAL) BY MOUTH AT BEDTIME. 90 capsule 2  . glucose blood test strip Checks sugars 4 times daily, on meal time insulin 100 each 11  . Insulin Pen Needle 32G X 4 MM MISC Use 4x a day 200 each 6  . ONETOUCH DELICA LANCETS 76H MISC 100 PENS BY DOES NOT APPLY ROUTE 2 (TWO) TIMES DAILY. 100 each 3  . OZEMPIC, 1 MG/DOSE, 2 MG/1.5ML SOPN INJECT 1 MG INTO THE SKIN ONCE A WEEK. 3 pen 0  . pantoprazole (PROTONIX) 40 MG tablet Take 1 tablet (40 mg total) by mouth daily. Take 30-60 min before first meal of the day 30 tablet 2  . potassium chloride (KLOR-CON 10) 10 MEQ tablet Take 1 tablet (10 mEq total) by mouth 2 (two) times daily. 180 tablet 3  . predniSONE (DELTASONE) 2.5 MG tablet Take 1 tablet (2.5 mg total) by mouth daily with breakfast. 30 tablet 0  . rosuvastatin (CRESTOR) 20 MG tablet TAKE 1 TABLET BY MOUTH EVERY DAY 30 tablet 0   No current facility-administered medications on file prior to visit.     Allergies  Allergen Reactions  . Penicillins Hives and Swelling    Has patient had a PCN reaction causing immediate rash, facial/tongue/throat swelling, SOB or lightheadedness with hypotension: Yes Has patient had a PCN reaction causing severe rash involving mucus membranes or skin necrosis: No Has patient had a PCN reaction that required hospitalization: No Has patient had a PCN reaction occurring within the last 10 years: No If all of the above answers are "NO", then may proceed with Cephalosporin use.    Family History  Problem Relation Age of Onset  . Heart disease Father   . Hypertension  Father   . Heart disease Brother   . Cancer Neg Hx   . Stroke Neg Hx   . Diabetes Neg Hx     BP 140/88 (BP Location: Right Arm, Patient Position: Sitting, Cuff Size: Large) Comment: has not taken antihypertensive  Pulse 92   Ht 5' 8.75" (1.746 m)   Wt (!) 308 lb 3.2 oz (139.8 kg)   LMP 11/09/2013  SpO2 96%   BMI 45.84 kg/m   Review of Systems She denies hypoglycemia.      Objective:   Physical Exam VITAL SIGNS:  See vs page GENERAL: no distress Pulses: dorsalis pedis intact bilat.   MSK: no deformity of the feet.  CV: trace bilat leg edema.  Skin:  no ulcer on the feet.  normal color and temp on the feet. Neuro: sensation is intact to touch on the feet.     A1c=9.8%    Assessment & Plan:  Insulin-requiring type 2 DM: worse Edema: this limits rx options.   Patient Instructions  Please change the tresiba and novolog to just tresiba, 130 units daily.   Please continue the same Ozempic and metformin.   check your blood sugar twice a day.  vary the time of day when you check, between before the 3 meals, and at bedtime.  also check if you have symptoms of your blood sugar being too high or too low.  please keep a record of the readings and bring it to your next appointment here (or you can bring the meter itself).  You can write it on any piece of paper.  please call us sooner if your blood sugar goes below 70, or if you have a lot of readings over 200. Please come back for a follow-up appointment in 2 months.

## 2018-02-20 NOTE — Patient Instructions (Addendum)
Please change the tresiba and novolog to just tresiba, 130 units daily.   Please continue the same Ozempic and metformin.   check your blood sugar twice a day.  vary the time of day when you check, between before the 3 meals, and at bedtime.  also check if you have symptoms of your blood sugar being too high or too low.  please keep a record of the readings and bring it to your next appointment here (or you can bring the meter itself).  You can write it on any piece of paper.  please call us sooner if your blood sugar goes below 70, or if you have a lot of readings over 200. Please come back for a follow-up appointment in 2 months.

## 2018-02-24 ENCOUNTER — Encounter: Payer: Self-pay | Admitting: Medical

## 2018-02-24 ENCOUNTER — Ambulatory Visit: Payer: BC Managed Care – PPO | Admitting: Medical

## 2018-02-24 VITALS — BP 140/86 | HR 94 | Temp 97.9°F | Resp 16 | Ht 69.0 in | Wt 306.4 lb

## 2018-02-24 DIAGNOSIS — D869 Sarcoidosis, unspecified: Secondary | ICD-10-CM

## 2018-02-24 DIAGNOSIS — G4733 Obstructive sleep apnea (adult) (pediatric): Secondary | ICD-10-CM

## 2018-02-24 DIAGNOSIS — Z7185 Encounter for immunization safety counseling: Secondary | ICD-10-CM

## 2018-02-24 DIAGNOSIS — Z7189 Other specified counseling: Secondary | ICD-10-CM

## 2018-02-24 DIAGNOSIS — E049 Nontoxic goiter, unspecified: Secondary | ICD-10-CM | POA: Diagnosis not present

## 2018-02-24 DIAGNOSIS — E785 Hyperlipidemia, unspecified: Secondary | ICD-10-CM

## 2018-02-24 DIAGNOSIS — K760 Fatty (change of) liver, not elsewhere classified: Secondary | ICD-10-CM

## 2018-02-24 DIAGNOSIS — R0989 Other specified symptoms and signs involving the circulatory and respiratory systems: Secondary | ICD-10-CM | POA: Insufficient documentation

## 2018-02-24 DIAGNOSIS — I10 Essential (primary) hypertension: Secondary | ICD-10-CM

## 2018-02-24 DIAGNOSIS — E040 Nontoxic diffuse goiter: Secondary | ICD-10-CM

## 2018-02-24 DIAGNOSIS — M109 Gout, unspecified: Secondary | ICD-10-CM

## 2018-02-24 DIAGNOSIS — E559 Vitamin D deficiency, unspecified: Secondary | ICD-10-CM

## 2018-02-24 DIAGNOSIS — Z794 Long term (current) use of insulin: Secondary | ICD-10-CM

## 2018-02-24 DIAGNOSIS — N289 Disorder of kidney and ureter, unspecified: Secondary | ICD-10-CM

## 2018-02-24 DIAGNOSIS — L602 Onychogryphosis: Secondary | ICD-10-CM

## 2018-02-24 DIAGNOSIS — Z9989 Dependence on other enabling machines and devices: Secondary | ICD-10-CM

## 2018-02-24 DIAGNOSIS — E1165 Type 2 diabetes mellitus with hyperglycemia: Secondary | ICD-10-CM

## 2018-02-24 NOTE — Progress Notes (Signed)
Subjective: Chief Complaint  Patient presents with  . Follow up    follow up DM sugar running 200-300   Here for recheck.    Currently on long term disability  Hypertension-compliant with bisoprolol 10 mg daily, amlodipine valsartan HCT 10/320/25 mg daily, clonidine 0.2 mg twice daily, taking Klor Con 56mq BID.  Checking BPs and getting 140-150s SBP since begin on prednisone for sarcoidosis.  No current dyspnea, no chest pain, no SOB.  Prior eval with Dr. HTerrence Dupont cariology.  Taking gabapentin 100 mg daily at bedtime  Diabetes-taking metformin 1000 mg twice daily, Tresiba injection 130 units nightly, Ozempic 163mweekly  hyperlipidemia - taking Crestor 2025maily, ASA 29m4mily  Gout- taking allopurinol 100 mg daily  GERD - taking Protonix and Pepcid  sarcoidosis - sees Dr. WertMelvyn Novasmonology, recently on taper down dose of prednisone  Exercising a little with walking  Compliant with CPAP   Past Medical History:  Diagnosis Date  . Back pain   . Diabetes mellitus without complication (HCC)[place]. Goiter    left side, saw dr Imojean Yoshino april 2015 for, had biopsy done was told it is cyst  . H/O echocardiogram    Dr. [facility]  . Hyperlipidemia   . Hypertension 2000  . Impaired fasting blood sugar 2014  . Obesity   . Other screening mammogram    planned for 05/2013  . Routine gynecological examination    last pap 2012  . Sarcoid   . Sleep apnea    on CPAP setting of 2     Current Outpatient Medications on File Prior to Visit  Medication Sig Dispense Refill  . allopurinol (ZYLOPRIM) 100 MG tablet Take 1 tablet (100 mg total) by mouth daily. 90 tablet 3  . amLODIPine-Valsartan-HCTZ (EXFORGE HCT) 10-320-25 MG TABS Take 1 tablet by mouth daily. 90 tablet 3  . aspirin 81 MG tablet Take 1 tablet (81 mg total) by mouth daily. 90 tablet 3  . bisoprolol (ZEBETA) 10 MG tablet Take 1 tablet (10 mg total) by mouth daily. 90 tablet 3  . Blood  Glucose Monitoring Suppl (ONE TOUCH ULTRA 2) w/Device KIT 1 Device by Does not apply route 2 (two) times daily. 1 each 0  . cholecalciferol (VITAMIN D) 1000 units tablet Take 1 tablet (1,000 Units total) by mouth daily. 90 tablet 3  . cloNIDine (CATAPRES) 0.2 MG tablet TAKE 1 TABLET TWICE A DAY (Patient taking differently: TAKE 1 TABLET three  times A DAY) 180 tablet 2  . famotidine (PEPCID) 20 MG tablet One at bedtime 30 tablet 11  . gabapentin (NEURONTIN) 100 MG capsule TAKE 1 CAPSULE (100 MG TOTAL) BY MOUTH AT BEDTIME. 90 capsule 2  . glucose blood test strip Checks sugars 4 times daily, on meal time insulin 100 each 11  . Insulin Degludec (TRESIBA FLEXTOUCH) 200 UNIT/ML SOPN Inject 130 Units into the skin daily. 9 pen 11  . Insulin Pen Needle 32G X 4 MM MISC Use 4x a day 200 each 6  . metFORMIN (GLUCOPHAGE) 1000 MG tablet Take 1 tablet (1,000 mg total) by mouth 2 (two) times daily with a meal. 180 tablet 3  . ONETOUCH DELICA LANCETS 33G 66QC 100 PENS BY DOES NOT APPLY ROUTE 2 (TWO) TIMES DAILY. 100 each 3  . OZEMPIC, 1 MG/DOSE, 2 MG/1.5ML SOPN INJECT 1 MG INTO THE SKIN ONCE A WEEK. 3 pen 0  . pantoprazole (PROTONIX) 40 MG tablet Take 1 tablet (40 mg total) by  mouth daily. Take 30-60 min before first meal of the day 30 tablet 2  . potassium chloride (KLOR-CON 10) 10 MEQ tablet Take 1 tablet (10 mEq total) by mouth 2 (two) times daily. 180 tablet 3  . predniSONE (DELTASONE) 2.5 MG tablet Take 1 tablet (2.5 mg total) by mouth daily with breakfast. 30 tablet 0  . rosuvastatin (CRESTOR) 20 MG tablet TAKE 1 TABLET BY MOUTH EVERY DAY 30 tablet 0   No current facility-administered medications on file prior to visit.    ROS as in subjective   Objective: BP 140/86   Pulse 94   Temp 97.9 F (36.6 C) (Oral)   Resp 16   Ht 5' 9"  (1.753 m)   Wt (!) 306 lb 6.4 oz (139 kg)   LMP 11/09/2013   SpO2 99%   BMI 45.25 kg/m   BP Readings from Last 3 Encounters:  02/24/18 140/86  02/20/18 140/88   01/15/18 122/80   Wt Readings from Last 3 Encounters:  02/24/18 (!) 306 lb 6.4 oz (139 kg)  02/20/18 (!) 308 lb 3.2 oz (139.8 kg)  01/15/18 (!) 304 lb (137.9 kg)    General appearance: alert, no distress, WD/WN,  Oral cavity: MMM Neck: supple, no lymphadenopathy, enlarged thyroid/goiter, asymmetrically enlarged more on the right, no masses, no JVD, no bruits Heart: RRR, normal S1, S2, no murmurs Lungs: CTA bilaterally, no wheezes, rhonchi, or rales Ext: no edema Pulses: 2+ symmetric, upper extremities, normal cap refill  Diabetic Foot Exam - Simple   Simple Foot Form Diabetic Foot exam was performed with the following findings:  Yes 02/24/2018 10:53 AM  Visual Inspection See comments:  Yes Sensation Testing Intact to touch and monofilament testing bilaterally:  Yes Pulse Check See comments:  Yes Comments 1+ pedal pulses, thickened toenails in general       Assessment: Encounter Diagnoses  Name Primary?  . Essential hypertension, benign Yes  . OSA on CPAP   . Fatty liver   . Diffuse goiter   . Type 2 diabetes mellitus with hyperglycemia, with long-term current use of insulin ([place])   . Nail hypertrophy   . Renal insufficiency   . Gout, unspecified cause, unspecified chronicity, unspecified site   . Hyperlipidemia, unspecified hyperlipidemia type   . Vitamin D deficiency   . Vaccine counseling   . Sarcoidosis   . Morbid obesity due to excess calories ([place])   . Decreased pedal pulses     Plan: Reviewed cancer screening including colonoscopy 2015, mammogram 2018, Pap smear February 2019   Diabetes- I reviewed her endocrinology note with Dr. Loanne Drilling from January 16 last week showing that she was continued on Ozempic and metformin, was taken off NovoLog but continued on Tresiba long-acting insulin 130 units daily  Sarcoidosis-she saw Dr. Shyrl Numbers December 11, and she continues on prednisone 2.5 mg every other day with plan to stop prednisone around this time, but she  said she just got a refill.  I advised she call Dr .Gustavus Bryant office today to verify as I think she is suppose to be off prednisone at this time.  She current reports no pulmonary symptoms.  Thyroid goiter, hx/o thyroid nodule - labs today, reviewed [place] from 05/2017.  At that time it was recommended she have a biopsy of the thyroid nodule which she declined.  Pending labs today we will likely revisit this recommendation.  HTN - continue Exforge 10/320/74m daily, Bisoprolol 138mdaily, Clonidine 1 tablet BID.  Request prior echo and cardiology notes, consider  modification of regimen as not to goal  Gout - compliant with Allopurinol 165m daily.   Lab today for uric acid.  hyperlipidemia - compliant, labs today  OSA - compliant with OSA  Fatty liver - counseled on need for weight loss, low fat diet.  I reviewed her January 2018 abdominal ultrasound showing fatty liver disease.  Decreased pedal pulses - consider ABIs.  KKemiawas seen today for follow up.  Diagnoses and all orders for this visit:  Essential hypertension, benign -     Comprehensive metabolic panel -     Lipid panel  OSA on CPAP  Fatty liver  Diffuse goiter -     TSH -     T4, Free  Type 2 diabetes mellitus with hyperglycemia, with long-term current use of insulin (HCC) -     Comprehensive metabolic panel -     CBC -     Lipid panel -     Microalbumin / creatinine urine ratio -     HM DIABETES EYE EXAM -     HM DIABETES FOOT EXAM -     Hemoglobin A1c  Nail hypertrophy  Renal insufficiency  Gout, unspecified cause, unspecified chronicity, unspecified site -     Uric acid  Hyperlipidemia, unspecified hyperlipidemia type -     Lipid panel  Vitamin D deficiency  Vaccine counseling  Sarcoidosis  Morbid obesity due to excess calories (HCC)  Decreased pedal pulses

## 2018-02-25 ENCOUNTER — Other Ambulatory Visit: Payer: Self-pay | Admitting: Medical

## 2018-02-25 DIAGNOSIS — M79671 Pain in right foot: Secondary | ICD-10-CM

## 2018-02-25 DIAGNOSIS — E119 Type 2 diabetes mellitus without complications: Secondary | ICD-10-CM

## 2018-02-25 DIAGNOSIS — M79672 Pain in left foot: Secondary | ICD-10-CM

## 2018-02-25 DIAGNOSIS — M792 Neuralgia and neuritis, unspecified: Secondary | ICD-10-CM

## 2018-02-25 LAB — COMPREHENSIVE METABOLIC PANEL
ALK PHOS: 87 IU/L (ref 39–117)
ALT: 28 IU/L (ref 0–32)
AST: 29 IU/L (ref 0–40)
Albumin/Globulin Ratio: 1.4 (ref 1.2–2.2)
Albumin: 4.4 g/dL (ref 3.8–4.9)
BUN/Creatinine Ratio: 15 (ref 9–23)
BUN: 14 mg/dL (ref 6–24)
Bilirubin Total: 0.4 mg/dL (ref 0.0–1.2)
CO2: 25 mmol/L (ref 20–29)
Calcium: 10.4 mg/dL — ABNORMAL HIGH (ref 8.7–10.2)
Chloride: 97 mmol/L (ref 96–106)
Creatinine, Ser: 0.96 mg/dL (ref 0.57–1.00)
GFR calc Af Amer: 77 mL/min/{1.73_m2} (ref >59)
GFR calc non Af Amer: 67 mL/min/{1.73_m2} (ref >59)
GLOBULIN, TOTAL: 3.1 g/dL (ref 1.5–4.5)
Glucose: 205 mg/dL — ABNORMAL HIGH (ref 65–99)
POTASSIUM: 3.7 mmol/L (ref 3.5–5.2)
SODIUM: 141 mmol/L (ref 134–144)
Total Protein: 7.5 g/dL (ref 6.0–8.5)

## 2018-02-25 LAB — CBC
Hematocrit: 42.5 % (ref 34.0–46.6)
Hemoglobin: 14.8 g/dL (ref 11.1–15.9)
MCH: 29.6 pg (ref 26.6–33.0)
MCHC: 34.8 g/dL (ref 31.5–35.7)
MCV: 85 fL (ref 79–97)
Platelets: 243 10*3/uL (ref 150–450)
RBC: 5 x10E6/uL (ref 3.77–5.28)
RDW: 12.2 % (ref 11.7–15.4)
WBC: 7.8 10*3/uL (ref 3.4–10.8)

## 2018-02-25 LAB — LIPID PANEL
CHOLESTEROL TOTAL: 292 mg/dL — AB (ref 100–199)
Chol/HDL Ratio: 6.2 ratio — ABNORMAL HIGH (ref 0.0–4.4)
HDL: 47 mg/dL (ref >39)
LDL Calculated: 184 mg/dL — ABNORMAL HIGH (ref 0–99)
TRIGLYCERIDES: 306 mg/dL — AB (ref 0–149)
VLDL Cholesterol Cal: 61 mg/dL — ABNORMAL HIGH (ref 5–40)

## 2018-02-25 LAB — MICROALBUMIN / CREATININE URINE RATIO
Creatinine, Urine: 131.2 mg/dL
Microalb/Creat Ratio: 10 mg/g creat (ref 0–29)
Microalbumin, Urine: 12.6 ug/mL

## 2018-02-25 LAB — TSH: TSH: 3.57 u[IU]/mL (ref 0.450–4.500)

## 2018-02-25 LAB — URIC ACID: URIC ACID: 6 mg/dL (ref 2.5–7.1)

## 2018-02-25 LAB — T4, FREE: Free T4: 1.1 ng/dL (ref 0.82–1.77)

## 2018-02-25 LAB — HEMOGLOBIN A1C
Est. average glucose Bld gHb Est-mCnc: 249 mg/dL
Hgb A1c MFr Bld: 10.3 % — ABNORMAL HIGH (ref 4.8–5.6)

## 2018-02-25 MED ORDER — GABAPENTIN 100 MG PO CAPS: 100.0000 mg | ORAL_CAPSULE | Freq: Every day | ORAL | 3 refills | Status: DC

## 2018-02-25 MED ORDER — CLONIDINE HCL 0.2 MG PO TABS: 0.2000 mg | ORAL_TABLET | Freq: Three times a day (TID) | ORAL | 11 refills | Status: DC

## 2018-02-28 ENCOUNTER — Other Ambulatory Visit: Payer: Self-pay | Admitting: Medical

## 2018-02-28 DIAGNOSIS — E041 Nontoxic single thyroid nodule: Secondary | ICD-10-CM

## 2018-03-11 ENCOUNTER — Other Ambulatory Visit: Payer: Self-pay | Admitting: Endocrinology

## 2018-03-12 ENCOUNTER — Other Ambulatory Visit: Payer: Self-pay | Admitting: Internal Medicine

## 2018-03-13 ENCOUNTER — Telehealth: Payer: Self-pay | Admitting: Medical

## 2018-03-13 ENCOUNTER — Ambulatory Visit: Payer: BC Managed Care – PPO | Admitting: Internal Medicine

## 2018-03-13 NOTE — Telephone Encounter (Signed)
Received requested records from [place]. Sending back for review.

## 2018-03-18 ENCOUNTER — Ambulatory Visit
Admission: RE | Admit: 2018-03-18 | Discharge: 2018-03-18 | Disposition: A | Discharge status: Home / Self Care | Payer: BC Managed Care – PPO | Source: Ambulatory Visit | Attending: Medical | Admitting: Medical

## 2018-03-18 ENCOUNTER — Other Ambulatory Visit (HOSPITAL_COMMUNITY)
Admission: RE | Admit: 2018-03-18 | Discharge: 2018-03-18 | Disposition: A | Discharge status: Home / Self Care | Payer: BC Managed Care – PPO | Source: Ambulatory Visit | Attending: Student | Admitting: Student

## 2018-03-18 DIAGNOSIS — E041 Nontoxic single thyroid nodule: Secondary | ICD-10-CM

## 2018-03-18 DIAGNOSIS — D440 Neoplasm of uncertain behavior of thyroid gland: Secondary | ICD-10-CM | POA: Diagnosis not present

## 2018-04-02 ENCOUNTER — Encounter: Payer: Self-pay | Admitting: Internal Medicine

## 2018-04-02 ENCOUNTER — Ambulatory Visit: Payer: BC Managed Care – PPO | Admitting: Internal Medicine

## 2018-04-02 ENCOUNTER — Telehealth: Payer: Self-pay | Admitting: Medical

## 2018-04-02 ENCOUNTER — Ambulatory Visit (INDEPENDENT_AMBULATORY_CARE_PROVIDER_SITE_OTHER)
Admission: RE | Admit: 2018-04-02 | Discharge: 2018-04-02 | Disposition: A | Discharge status: Home / Self Care | Payer: BC Managed Care – PPO | Source: Ambulatory Visit | Attending: Internal Medicine | Admitting: Internal Medicine

## 2018-04-02 DIAGNOSIS — D869 Sarcoidosis, unspecified: Secondary | ICD-10-CM

## 2018-04-02 DIAGNOSIS — R0602 Shortness of breath: Secondary | ICD-10-CM | POA: Diagnosis not present

## 2018-04-02 LAB — BASIC METABOLIC PANEL
BUN: 14 mg/dL (ref 6–23)
CO2: 31 mEq/L (ref 19–32)
Calcium: 9.9 mg/dL (ref 8.4–10.5)
Chloride: 98 mEq/L (ref 96–112)
Creatinine, Ser: 0.97 mg/dL (ref 0.40–1.20)
GFR: 72.03 mL/min (ref >60.00)
Glucose, Bld: 143 mg/dL — ABNORMAL HIGH (ref 70–99)
Potassium: 3.1 mEq/L — ABNORMAL LOW (ref 3.5–5.1)
Sodium: 140 mEq/L (ref 135–145)

## 2018-04-02 LAB — SEDIMENTATION RATE: Sed Rate: 16 mm/hr (ref 0–30)

## 2018-04-02 NOTE — Telephone Encounter (Signed)
See pathology report Afirma test I got back.  Please send to Dr. Loanne Drilling for review, and make sure his nurse sees this as I think needs to see her soon in reference to her recent thyroid biopsy and results     Let her know we are consulting with Dr. Loanne Drilling on the result.

## 2018-04-02 NOTE — Progress Notes (Signed)
Subjective:    Patient ID: Amy Rosario, female   DOB: [birth year]     MRN: [id number]    Brief patient profile:  54 yobf with MO never smoker  Presented sob/abn lfts Feb 2018 > ? dx Sarcoid > referred to pulmonary clinic 04/18/2016 by Amy Mcgregor PA  - FOB 07/05/16> severe diffuse cobblestoning :  Endobronchial bx:  NCG    Last worked in Mar 25 2016   History of Present Illness  04/18/2016 1st [facility] office visit/ Amy Rosario   Chief Complaint  Patient presents with  . Pulmonary Consult    Referred by Dr. Chana Rosario for eval of Sarcoidosis. Pt states she was just dxed recently. She started having SOB in mid Feb 2018.  She gets winded walking short distances such as to her mailbox and back.  She also c/o cough- prod at times with minimal yellow to clear sputum. She states she has recently noticed bumps on her face.   onset of sob was indolent really since first of 2018 assoc with abd swelling and  mostly dry cough then noted "red bumps" on face and some aching of elbows but not ankles or wrists. No viz symptoms/ some anorexia but min wt loss  Doe = MMRC1 = can walk nl pace, flat grade, can't hurry or go uphills or steps s sob   rec Sarcoidosis is a benign inflammatory condition  Treatment is generally reserved for patients with major symptoms  schedule dermatology for skin biopsy> neg for sarcoid     07/03/2016  f/u ov/Amy Rosario re: sarcoidosis  Chief Complaint  Patient presents with  . Follow-up    Pt c/o stiffness in her neck in the am's.  She also c/o swelling in both hands.   much better on pred, much worse off in terms of all her arthritis symptoms esp L hand / wrist  Doe continues at level = MMRC1 rec - FOB 07/05/16> severe diffuse cobblestoning :  Endobronchial bx:  NCG rec pred 20 mg daily until better then 10 mg daily and f/u in one month   08/17/2016  f/u ov/Amy Rosario re:  Sarcoid with prominent airway involvement on pred 10 mg x one week s flare  Chief Complaint  Patient  presents with  . Follow-up    Arthritic pain improved on pred. Breathing is doing well and no new co's today.    main symptom arthritis elbows/ hands resolved/  Not limited by breathing from desired activities  But not very active rec Take zebeta (10 mg) each am and continue clonidine 0.2 three times day  Prednisone ceiling of 20 mg per day and floor of 5 mg with breakfast      05/03/2017  f/u ov/Amy Rosario re: sarcoid f/u 5 mg qod  Chief Complaint  Patient presents with  . Pulmonary follow up    productive Cough  clear in color since 04/29/17, Has felt like the right side of her neck is swollen and sore for since 04/26/17    Dyspnea: not limiting Cough: 04/29/17 acute onset p pfts-otherwise was doing fine  / cough is day > noct, min mucoid  Sleep: noct "wheeze" rec Prednisone ceiling is 20 mg per day until coughing / breathing better then taper to floor of 5 mg every other day  Pantoprazole (protonix) 40 mg   Take  30-60 min before first meal of the day and Pepcid (famotidine)  20 mg one @  bedtime until cough is gone for a week  Then try  off GERD diet  For cough > tessalon 200 mg every 8 hours as needed     08/05/2017  f/u ov/Amy Rosario re: sarcoidosis  On pred 5 mg daily (did not yet taper to 5 mg qod as rec)  Chief Complaint  Patient presents with  . Follow-up    Breathing has improved some since the last visit.   dyspnea :   MMRC1 = can walk nl pace, flat grade, can't hurry or go uphills or steps s sob   Cough better  rec Try prednisone 5 mg every other day if your breathing and cough are under good control and if they get worse ok to increase to a maxmum of 20 mg per day and then work back to the lowest dose where you are doing well  Please schedule a follow up office visit in 4 weeks, sooner if needed  with all medications /inhalers/ solutions in hand so we can verify exactly what you are taking. This includes all medications from all doctors and over the counters  PFTs on return      01/15/2018  f/u ov/Amy Rosario re: sarcoidosis on 5 mg qod/no meds Chief Complaint  Patient presents with  . Follow-up    PFT's done today. Breathing is about the same. She has good days and bad days.    Dyspnea:  Walking 30 min  3 x weekly stops half way due to sob   Cough: min / dry / / tessalon works  Sleeping: bed flat/ 2 pillows  SABA use: no  02:  Has concentrator 2lpm  X years did not disclose this previously (Amy Rosario)  rec Prednsione 5 mg alternating 2.5 mg every other day  thru the holidays then 2.5 mg every other day for a month then stop it - the lowest dose that works is right dose  Please schedule a follow up office visit in 8 weeks, call sooner if needed with cxr on return   . 04/02/2018  f/u ov/Amy Rosario re: sarcoid, no worse  On pred 2.5 mg qod (did not try off as rec Chief Complaint  Patient presents with  . Follow-up    CXR done today. Breathing is unchanged. She has occ cough- non prod.   Dyspnea:  Stopped doing regular walking  Cough: same Amy Rosario works Sleeping: bed is flat/ 1-2 pillows  SABA use: none 02: 2lpm hs x 10 y per Amy Rosario      No obvious day to day or daytime variability or assoc excess/ purulent sputum or mucus plugs or hemoptysis or cp or chest tightness, subjective wheeze or overt sinus or hb symptoms.   Sleeping  without nocturnal  or early am exacerbation  of respiratory  c/o's or need for noct saba. Also denies any obvious fluctuation of symptoms with weather or environmental changes or other aggravating or alleviating factors except as outlined above   No unusual exposure hx or h/o childhood pna/ asthma or knowledge of premature birth.  Current Allergies, Complete Past Medical History, Past Surgical History, Family History, and Social History were reviewed in [employer] record.  ROS  The following are not active complaints unless bolded Hoarseness, sore throat, dysphagia, dental problems, itching, sneezing,  nasal  congestion or discharge of excess mucus or purulent secretions, ear ache,   fever, chills, sweats, unintended wt loss or wt gain, classically pleuritic or exertional cp,  orthopnea pnd or arm/hand swelling  or leg swelling, presyncope, palpitations, abdominal pain, anorexia, nausea, vomiting, diarrhea  or change in  bowel habits or change in bladder habits, change in stools or change in urine, dysuria, hematuria,  rash, arthralgias, visual complaints, headache, numbness, weakness or ataxia or problems with walking or coordination,  change in mood or  memory.        Current Meds  Medication Sig  . allopurinol (ZYLOPRIM) 100 MG tablet Take 1 tablet (100 mg total) by mouth daily.  Marland Kitchen amLODIPine-Valsartan-HCTZ (EXFORGE HCT) 10-320-25 MG TABS Take 1 tablet by mouth daily.  Marland Kitchen aspirin 81 MG tablet Take 1 tablet (81 mg total) by mouth daily.  . bisoprolol (ZEBETA) 10 MG tablet Take 1 tablet (10 mg total) by mouth daily.  . Blood Glucose Monitoring Suppl (ONE TOUCH ULTRA 2) w/Device KIT 1 Device by Does not apply route 2 (two) times daily.  . cholecalciferol (VITAMIN D) 1000 units tablet Take 1 tablet (1,000 Units total) by mouth daily.  . cloNIDine (CATAPRES) 0.2 MG tablet Take 1 tablet (0.2 mg total) by mouth 3 (three) times daily.  . famotidine (PEPCID) 20 MG tablet One at bedtime  . gabapentin (NEURONTIN) 100 MG capsule Take 1 capsule (100 mg total) by mouth at bedtime.  Marland Kitchen glucose blood test strip Checks sugars 4 times daily, on meal time insulin  . Insulin Degludec (TRESIBA FLEXTOUCH) 200 UNIT/ML SOPN Inject 130 Units into the skin daily.  . Insulin Pen Needle 32G X 4 MM MISC Use 4x a day  . Lancets (ONETOUCH DELICA PLUS [id number]) MISC 100 PENS BY DOES NOT APPLY ROUTE 2 (TWO) TIMES DAILY.  . metFORMIN (GLUCOPHAGE) 1000 MG tablet Take 1 tablet (1,000 mg total) by mouth 2 (two) times daily with a meal.  . OZEMPIC, 1 MG/DOSE, 2 MG/1.5ML SOPN INJECT 1 MG INTO THE SKIN ONCE A WEEK.  . pantoprazole  (PROTONIX) 40 MG tablet Take 1 tablet (40 mg total) by mouth daily. Take 30-60 min before first meal of the day  . potassium chloride (KLOR-CON 10) 10 MEQ tablet Take 1 tablet (10 mEq total) by mouth 2 (two) times daily.  . predniSONE (DELTASONE) 2.5 MG tablet TAKE 1 TABLET (2.5 MG TOTAL) BY MOUTH DAILY WITH BREAKFAST.  . rosuvastatin (CRESTOR) 20 MG tablet TAKE 1 TABLET BY MOUTH EVERY DAY             Objective:   Physical Exam  Obese amb bf nad   04/02/2018        307  01/15/2018      304  08/05/2017          305 05/03/2017        309  01/21/2017      312  12/03/2016      302  09/28/2016        290  08/17/2016        284  07/03/2016        276   06/04/2016       267   04/18/16 263 lb (119.3 kg)  03/26/16 264 lb 6.4 oz (119.9 kg)  03/20/16 263 lb (119.3 kg)   9/6/ 2017        295      Vital signs reviewed - Note on arrival 02 sats  97% on RA      HEENT: nl dentition, turbinates bilaterally, and oropharynx. Nl external ear canals without cough reflex   NECK :  without JVD/Nodes/  nl carotid upstrokes bilaterally - golf ball sided goiter R s deviation   LUNGS: no acc muscle use,  Nl contour chest which  is clear to A and P bilaterally without cough on insp or exp maneuvers   CV:  RRR  no s3 or murmur or increase in P2, and no edema   ABD:  Quite obese soft and nontender with nl inspiratory excursion in the supine position. No bruits or organomegaly appreciated, bowel sounds nl  MS:  Nl gait/ ext warm without deformities, calf tenderness, cyanosis or clubbing No obvious joint restrictions   SKIN: warm and dry without lesions    NEURO:  alert, approp, nl sensorium with  no motor or cerebellar deficits apparent.      CXR PA and Lateral:   04/02/2018 :    I personally reviewed images and agree with radiology impression as follows:   No active cardiopulmonary disease.     Chemistry      Component Value Date/Time   NA 140 04/02/2018 1044   NA 141 02/24/2018 1100   K 3.1  (L) 04/02/2018 1044   CL 98 04/02/2018 1044   CO2 31 04/02/2018 1044   BUN 14 04/02/2018 1044   BUN 14 02/24/2018 1100   CREATININE 0.97 04/02/2018 1044   CREATININE 0.88 08/29/2016 0925      Component Value Date/Time   CALCIUM 9.9 04/02/2018 1044   ALKPHOS 87 02/24/2018 1100   AST 29 02/24/2018 1100   ALT 28 02/24/2018 1100   BILITOT 0.4 02/24/2018 1100          Assessment:

## 2018-04-02 NOTE — Patient Instructions (Signed)
No more prednisone for now  Let Amy Rosario know if your sugar is falling off the prednisone   Please remember to go to the lab department   for your tests - we will call you with the results when they are available.      Please schedule a follow up office visit in 6 weeks, call sooner if needed

## 2018-04-03 ENCOUNTER — Encounter (HOSPITAL_COMMUNITY): Payer: Self-pay

## 2018-04-03 NOTE — Telephone Encounter (Signed)
Faxed to Dr Loanne Drilling

## 2018-04-06 ENCOUNTER — Encounter: Payer: Self-pay | Admitting: Internal Medicine

## 2018-04-06 NOTE — Assessment & Plan Note (Signed)
Onset late 2017 with sob/  abn labs  Labs 03/08/16 :  Ca 12.4 with TP 8.8 and alb 3.9  - Stopped working 03/23/16 (notified pulmonary office 09/26/2017  And requested paperwork completion) ESR 04/18/2016 =  90  - Derm eval rec  04/19/2016   > done 05/31/16  Dx bulous impetigo / neg for NCG - ESR 06/04/2016  119 >  Prednisone 10 mg take  4 each am x 2 days,   2 each am x 2 days,  1 each am x 2 days and stop  - PFT's  06/04/2016  FVC  2.46 (73%)  No obst and no improvement from saba p nothing  prior to study with DLCO  64/63 % corrects to 105  % for alv volume   - ACE level 06/04/2016 = 260  - FOB 07/05/16> severe diffuse cobblestoning :  Endobronchial bx:  NCG rec pred 20 mg daily until better then 10 mg daily   - 08/17/2016 improved so taper to 5 mg daily as floor  - ESR 09/28/2016 down to 35/angiotensin 94  on 10 mg daily s symptoms> rec ceiling 20/ floor 5 mg daily  - referred to ophthalmology 09/28/2016 > neg  - PFT's  04/29/2017  FVC 2.26 (67%)   with DLCO  67 % corrects to 116  % for alv volume  And ERV 46% - PFT's  01/15/2018  FVC 2.31 (69%)  no obst  With nothing prior to study with DLCO  68 % corrects to 117  % for alv volume on 5 mg qod so rec taper to 2.5 qod   - try off prednisone 04/02/2018 with nl cxr, nl calcium and prot/alb  7.5/4.4 on 2.5 mg qod   A good rule of thumb is that >95% of pts with active sarcoid in any organ will have some plain cxr changes - on the other hand  if there are active pulmonary symptoms the cxr will look much worse than the patient:  No evidence of either scenario here/ strongly doubt active dz at this point on less than physiologic doses of pred s evidence adrenal insufficiency so ok to try off prednisone at this point  Discussed in detail all the  indications, usual  risks and alternatives  relative to the benefits with patient who agrees to proceed with conservative rx as outlined   Advised may see further drop in blood glucose off prednisone and needs to have her  meds adjusted rather than eat more to offset this problem.

## 2018-04-06 NOTE — Assessment & Plan Note (Signed)
ERV 04/29/17 = 46% c/w body habitus   Body mass index is 46.8 kg/m.  -  trending up Lab Results  Component Value Date   TSH 3.570 02/24/2018     Contributing to gerd risk/ doe/reviewed the need and the process to achieve and maintain neg calorie balance > defer f/u primary care including intermittently monitoring thyroid status    I had an extended discussion with the patient reviewing all relevant studies completed to date and  lasting 15 to 20 minutes of a 25 minute visit    Each maintenance medication was reviewed in detail including most importantly the difference between maintenance and prns and under what circumstances the prns are to be triggered using an action plan format that is not reflected in the computer generated alphabetically organized AVS.     Please see AVS for specific instructions unique to this visit that I personally wrote and verbalized to the the pt in detail and then reviewed with pt  by my nurse highlighting any  changes in therapy recommended at today's visit to their plan of care.    F/u will be in 6 weeks - call sooner if needed

## 2018-04-07 ENCOUNTER — Ambulatory Visit: Payer: BC Managed Care – PPO | Admitting: Endocrinology

## 2018-04-07 ENCOUNTER — Encounter: Payer: Self-pay | Admitting: Endocrinology

## 2018-04-07 VITALS — BP 144/84 | HR 90 | Ht 68.0 in | Wt 309.6 lb

## 2018-04-07 DIAGNOSIS — E041 Nontoxic single thyroid nodule: Secondary | ICD-10-CM

## 2018-04-07 NOTE — Patient Instructions (Addendum)
Let's recheck the ultrasound.  you will receive a phone call, about a day and time for an appointment. Your blood pressure is high today.  Please see your primary care provider soon, to have it rechecked Please continue the same medications for diabetes.   As you are off the prednisone, you blood sugar might go low.  Please call if this hapens check your blood sugar twice a day.  vary the time of day when you check, between before the 3 meals, and at bedtime.  also check if you have symptoms of your blood sugar being too high or too low.  please keep a record of the readings and bring it to your next appointment here (or you can bring the meter itself).  You can write it on any piece of paper.  please call us sooner if your blood sugar goes below 70, or if you have a lot of readings over 200. Please come back for a follow-up appointment in 2 months.      Bariatric Surgery You have so much to gain by losing weight.  You may have already tried every diet and exercise plan imaginable.  And, you may have sought advice from your family physician, too.   Sometimes, in spite of such diligent efforts, you may not be able to achieve long-term results by yourself.  In cases of severe obesity, bariatric or weight loss surgery is a proven method of achieving long-term weight control.  Our Services Our bariatric surgery programs offer our patients new hope and long-term weight-loss solution.  Since introducing our services in 2003, we have conducted more than 2,400 successful procedures.  Our program is designated as a Programmer, multimedia by the Metabolic and Bariatric Surgery Accreditation and Quality Improvement Program (MBSAQIP), a IT trainer that sets rigorous patient safety and outcome standards.  Our program is also designated as a Ecologist by [employer].   Our exceptional weight-loss surgery team specializes in diagnosis, treatment, follow-up care, and ongoing  support for our patients with severe weight loss challenges.  We currently offer laparoscopic sleeve gastrectomy, gastric bypass, and adjustable gastric band (LAP-BAND).    Attend our Eugenio Saenz Choosing to undergo a bariatric procedure is a big decision, and one that should not be taken lightly.  You now have two options in how you learn about weight-loss surgery - in person or online.  Our objective is to ensure you have all of the information that you need to evaluate the advantages and obligations of this life changing procedure.  Please note that you are not alone in this process, and our experienced team is ready to assist and answer all of your questions.  There are several ways to register for a seminar (either on-line or in person): 1)  Call [phone] 2) Go on-line to [facility] and register for either type of seminar.  [email or web address]

## 2018-04-07 NOTE — Progress Notes (Signed)
Subjective:    Patient ID: Amy Rosario, female    DOB: Jan 09, 1961, 58 y.o.   MRN: [id number]  HPI Pt returns for f/u of diabetes mellitus: DM type: Insulin-requiring type 2.   Dx'ed: 2015.  Complications: none Therapy: insulin since 2017, ozempic, and metformin GDM: never DKA: never Severe hypoglycemia: never Pancreatitis: never.  Pancreatic imaging: normal on 2018 [place].  Other: she takes QD insulin, after poor results with multiple daily injections; she takes intermitt prednisone for sarcoidosis;    Interval history: She finished prednisone 5 days ago.  she brings a record of her cbg's which I have reviewed today.  cbg varies from 86-201.  It is in general higher as the day goes on, but not necessarily so.  pt states she feels well in general.    Pt also returns for f/u of MNG (dx'ed 2015; bx in 2020 showed Beth Cat 3, and affirma reported insuff material) Past Medical History:  Diagnosis Date  . Back pain   . Diabetes mellitus without complication ([place])   . Goiter    left side, saw dr tysinger april 2015 for, had biopsy done was told it is cyst  . H/O echocardiogram    Dr. Charolette [facility], [facility]  . Hyperlipidemia   . Hypertension 2000  . Impaired fasting blood sugar 2014  . Obesity   . Other screening mammogram    planned for 05/2013  . Routine gynecological examination    last pap 2012  . Sarcoid   . Sleep apnea    on CPAP setting of 2    Past Surgical History:  Procedure Laterality Date  . COLONOSCOPY WITH PROPOFOL N/A 09/21/2013   Procedure: COLONOSCOPY WITH PROPOFOL;  Surgeon: Juanita Craver, MD;  Location: [facility];  Service: Endoscopy;  Laterality: N/A;  . CYST EXCISION     right low back  . TUBAL LIGATION    . VIDEO BRONCHOSCOPY Bilateral 07/05/2016   Procedure: VIDEO BRONCHOSCOPY WITH FLUORO;  Surgeon: Tanda Rockers, MD;  Location: [facility];  Service: Endoscopy;  Laterality: Bilateral;    Social History   Socioeconomic  History  . Marital status: Single    Spouse name: Not on file  . Number of children: Not on file  . Years of education: Not on file  . Highest education level: Not on file  Occupational History  . Not on file  Social Needs  . Financial resource strain: Not on file  . Food insecurity:    Worry: Not on file    Inability: Not on file  . Transportation needs:    Medical: Not on file    Non-medical: Not on file  Tobacco Use  . Smoking status: Never Smoker  . Smokeless tobacco: Never Used  Substance and Sexual Activity  . Alcohol use: No  . Drug use: No  . Sexual activity: Not Currently    Partners: Male    Birth control/protection: Post-menopausal, None  Lifestyle  . Physical activity:    Days per week: Not on file    Minutes per session: Not on file  . Stress: Not on file  Relationships  . Social connections:    Talks on phone: Not on file    Gets together: Not on file    Attends religious service: Not on file    Active member of club or organization: Not on file    Attends meetings of clubs or organizations: Not on file    Relationship status: Not on file  .  Intimate partner violence:    Fear of current or ex partner: Not on file    Emotionally abused: Not on file    Physically abused: Not on file    Forced sexual activity: Not on file  Other Topics Concern  . 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 allopurinol (ZYLOPRIM) 100 MG tablet Take 1 tablet (100 mg total) by mouth daily. 90 tablet 3  . amLODIPine-Valsartan-HCTZ (EXFORGE HCT) 10-320-25 MG TABS Take 1 tablet by mouth daily. 90 tablet 3  . aspirin 81 MG tablet Take 1 tablet (81 mg total) by mouth daily. 90 tablet 3  . bisoprolol (ZEBETA) 10 MG tablet Take 1 tablet (10 mg total)  by mouth daily. 90 tablet 3  . Blood Glucose Monitoring Suppl (ONE TOUCH ULTRA 2) w/Device KIT 1 Device by Does not apply route 2 (two) times daily. 1 each 0  . cholecalciferol (VITAMIN D) 1000 units tablet Take 1 tablet (1,000 Units total) by mouth daily. 90 tablet 3  . cloNIDine (CATAPRES) 0.2 MG tablet Take 1 tablet (0.2 mg total) by mouth 3 (three) times daily. 90 tablet 11  . famotidine (PEPCID) 20 MG tablet One at bedtime 30 tablet 11  . gabapentin (NEURONTIN) 100 MG capsule Take 1 capsule (100 mg total) by mouth at bedtime. 90 capsule 3  . glucose blood test strip Checks sugars 4 times daily, on meal time insulin 100 each 11  . Insulin Degludec (TRESIBA FLEXTOUCH) 200 UNIT/ML SOPN Inject 130 Units into the skin daily. 9 pen 11  . Insulin Pen Needle 32G X 4 MM MISC Use 4x a day 200 each 6  . Lancets (ONETOUCH DELICA PLUS [id number]) MISC 100 PENS BY DOES NOT APPLY ROUTE 2 (TWO) TIMES DAILY. 100 each 3  . metFORMIN (GLUCOPHAGE) 1000 MG tablet Take 1 tablet (1,000 mg total) by mouth 2 (two) times daily with a meal. 180 tablet 3  . OZEMPIC, 1 MG/DOSE, 2 MG/1.5ML SOPN INJECT 1 MG INTO THE SKIN ONCE A WEEK. 9 pen 0  . pantoprazole (PROTONIX) 40 MG tablet Take 1 tablet (40 mg total) by mouth daily. Take 30-60 min before first meal of the day 30 tablet 2  . potassium chloride (KLOR-CON 10) 10 MEQ tablet Take 1 tablet (10 mEq total) by mouth 2 (two) times daily. 180 tablet 3  . rosuvastatin (CRESTOR) 20 MG tablet TAKE 1 TABLET BY MOUTH EVERY DAY 30 tablet 0   No current facility-administered medications on file prior to visit.       Family History  Problem Relation Age of Onset  . Heart disease Father   . Hypertension Father   . Heart disease Brother   . Cancer Neg Hx   . Stroke Neg Hx   . Diabetes Neg Hx     BP (!) 144/84 (BP Location: Left Arm, Patient Position: Sitting, Cuff Size: Large) Comment: did not take antihypertensive  Pulse 90   Ht 5' 8"  (1.727 m)   Wt (!) 309 lb 9.6 oz  (140.4 kg)   LMP 11/09/2013   SpO2 98%   BMI 47.07 kg/m    Review of Systems She denies hypoglycemia    Objective:   Physical Exam VITAL SIGNS:  See vs page GENERAL: no distress  NECK: thyroid is 5x normal size, (R>L), but I cannot appreciate a discrete nodule.  Pulses: dorsalis pedis intact bilat.   MSK: no deformity of the feet CV: 1+ bilat leg edema Skin:  no ulcer on the feet.  normal color and temp on the feet. Neuro: sensation is intact to touch on the feet    Lab Results  Component Value Date   TSH 3.570 02/24/2018       Assessment & Plan:  MNG: due for recheck Insulin-requiring type 2 DM: well-controlled. Obesity: persistent.  Surgery will help.  Edema: this limits rx options.  Patient Instructions  Let's recheck the ultrasound.  you will receive a phone call, about a day and time for an appointment. Your blood pressure is high today.  Please see your primary care provider soon, to have it rechecked Please continue the same medications for diabetes.   As you are off the prednisone, you blood sugar might go low.  Please call if this hapens check your blood sugar twice a day.  vary the time of day when you check, between before the 3 meals, and at bedtime.  also check if you have symptoms of your blood sugar being too high or too low.  please keep a record of the readings and bring it to your next appointment here (or you can bring the meter itself).  You can write it on any piece of paper.  please call us sooner if your blood sugar goes below 70, or if you have a lot of readings over 200. Please come back for a follow-up appointment in 2 months.      Bariatric Surgery You have so much to gain by losing weight.  You may have already tried every diet and exercise plan imaginable.  And, you may have sought advice from your family physician, too.   Sometimes, in spite of such diligent efforts, you may not be able to achieve long-term results by yourself.  In cases of  severe obesity, bariatric or weight loss surgery is a proven method of achieving long-term weight control.  Our Services Our bariatric surgery programs offer our patients new hope and long-term weight-loss solution.  Since introducing our services in 2003, we have conducted more than 2,400 successful procedures.  Our program is designated as a Programmer, multimedia by the Metabolic and Bariatric Surgery Accreditation and Quality Improvement Program (MBSAQIP), a IT trainer that sets rigorous patient safety and outcome standards.  Our program is also designated as a Ecologist by [employer].   Our exceptional weight-loss surgery team specializes in diagnosis, treatment, follow-up care, and ongoing support for our patients with severe weight loss challenges.  We currently offer laparoscopic sleeve gastrectomy, gastric bypass, and adjustable gastric band (LAP-BAND).    Attend our Conde Choosing to undergo a bariatric procedure is a big decision, and one that should not be taken lightly.  You now have two options in how you learn about weight-loss surgery - in person or online.  Our objective is to ensure you have all of the information that you need to evaluate the advantages and obligations of this life changing procedure.  Please note that you are not alone in this process, and our experienced team is ready to assist and answer all of your questions.  There are several ways to register for a seminar (either on-line or in person): 1)  Call [phone] 2) Go on-line to [facility] and register for either type of seminar.  [email or web address]

## 2018-04-15 ENCOUNTER — Other Ambulatory Visit: Payer: Self-pay | Admitting: Internal Medicine

## 2018-04-22 NOTE — Progress Notes (Addendum)
58 y.o. [id number] Single Black or African American Not Hispanic or Latino female here for annual exam.  No vaginal bleeding.     Patient's last menstrual period was 11/09/2013.          Sexually active: No.  The current method of family planning is post menopausal status.    Exercising: Yes.    walking Smoker:  no  Health Maintenance: Pap:  03/21/2017 WNL NEG HR HPV, 03-20-16 WNL NEG HR HPV, 05-27-13 LGSIL NEG HR HPV  History of abnormal Pap:  Yes LGSIL 2015 MMG:  03-23-16 DX MMG  WNL Colonoscopy:  09-21-13 normal  BMD:   Never TDaP:  05-27-13 Gardasil: N/A   reports that she has never smoked. She has never used smokeless tobacco. She reports that she does not drink alcohol or use drugs.  She is on disability. 3 grown kids (all boys), 12 grand kids and one on the way.   Past Medical History:  Diagnosis Date  . Back pain   . Diabetes mellitus without complication ([place])   . Goiter    left side, saw dr tysinger april 2015 for, had biopsy done was told it is cyst  . H/O echocardiogram    Dr. Charolette [facility], [facility]  . Hyperlipidemia   . Hypertension 2000  . Impaired fasting blood sugar 2014  . Obesity   . Other screening mammogram    planned for 05/2013  . Routine gynecological examination    last pap 2012  . Sarcoid   . Sleep apnea    on CPAP setting of 2    Past Surgical History:  Procedure Laterality Date  . COLONOSCOPY WITH PROPOFOL N/A 09/21/2013   Procedure: COLONOSCOPY WITH PROPOFOL;  Surgeon: Juanita Craver, MD;  Location: [facility];  Service: Endoscopy;  Laterality: N/A;  . CYST EXCISION     right low back  . TUBAL LIGATION    . VIDEO BRONCHOSCOPY Bilateral 07/05/2016   Procedure: VIDEO BRONCHOSCOPY WITH FLUORO;  Surgeon: Tanda Rockers, MD;  Location: [facility];  Service: Endoscopy;  Laterality: Bilateral;    Current Outpatient Medications  Medication Sig Dispense Refill  . allopurinol (ZYLOPRIM) 100 MG tablet Take 1 tablet (100 mg total)  by mouth daily. 90 tablet 3  . amLODIPine-Valsartan-HCTZ (EXFORGE HCT) 10-320-25 MG TABS Take 1 tablet by mouth daily. 90 tablet 3  . aspirin 81 MG tablet Take 1 tablet (81 mg total) by mouth daily. 90 tablet 3  . bisoprolol (ZEBETA) 10 MG tablet Take 1 tablet (10 mg total) by mouth daily. 90 tablet 3  . Blood Glucose Monitoring Suppl (ONE TOUCH ULTRA 2) w/Device KIT 1 Device by Does not apply route 2 (two) times daily. 1 each 0  . cholecalciferol (VITAMIN D) 1000 units tablet Take 1 tablet (1,000 Units total) by mouth daily. 90 tablet 3  . cloNIDine (CATAPRES) 0.2 MG tablet Take 1 tablet (0.2 mg total) by mouth 3 (three) times daily. 90 tablet 11  . famotidine (PEPCID) 20 MG tablet One at bedtime 30 tablet 11  . gabapentin (NEURONTIN) 100 MG capsule Take 1 capsule (100 mg total) by mouth at bedtime. 90 capsule 3  . glucose blood test strip Checks sugars 4 times daily, on meal time insulin 100 each 11  . Insulin Degludec (TRESIBA FLEXTOUCH) 200 UNIT/ML SOPN Inject 130 Units into the skin daily. 9 pen 11  . Insulin Pen Needle 32G X 4 MM MISC Use 4x a day 200 each 6  . Lancets [facility]  DELICA PLUS [id number]) MISC 100 PENS BY DOES NOT APPLY ROUTE 2 (TWO) TIMES DAILY. 100 each 3  . metFORMIN (GLUCOPHAGE) 1000 MG tablet Take 1 tablet (1,000 mg total) by mouth 2 (two) times daily with a meal. 180 tablet 3  . OZEMPIC, 1 MG/DOSE, 2 MG/1.5ML SOPN INJECT 1 MG INTO THE SKIN ONCE A WEEK. 9 pen 0  . pantoprazole (PROTONIX) 40 MG tablet Take 1 tablet (40 mg total) by mouth daily. Take 30-60 min before first meal of the day 30 tablet 2  . potassium chloride (KLOR-CON 10) 10 MEQ tablet Take 1 tablet (10 mEq total) by mouth 2 (two) times daily. 180 tablet 3  . rosuvastatin (CRESTOR) 20 MG tablet TAKE 1 TABLET BY MOUTH EVERY DAY 30 tablet 0   No current facility-administered medications for this visit.     Family History  Problem Relation Age of Onset  . Heart disease Father   . Hypertension Father   .  Heart disease Brother   . Cancer Neg Hx   . Stroke Neg Hx   . Diabetes Neg Hx     Review of Systems  Constitutional: Negative.   HENT: Positive for nosebleeds.   Eyes: Negative.   Respiratory: Negative.   Gastrointestinal: Negative.   Endocrine: Negative.   Genitourinary: Negative.   Musculoskeletal: Positive for neck pain.  Skin: Negative.   Allergic/Immunologic: Negative.   Neurological: Negative.   Hematological: Negative.   Psychiatric/Behavioral: Negative.     Exam:   BP (!) 150/90 (BP Location: Right Arm, Patient Position: Sitting, Cuff Size: Large)   Pulse 88   Ht 5' 8.5" (1.74 m)   Wt (!) 310 lb (140.6 kg)   LMP 11/09/2013   BMI 46.44 kg/m   Weight change: @WEIGHTCHANGE @ Height:   Height: 5' 8.5" (174 cm)  Ht Readings from Last 3 Encounters:  04/23/18 5' 8.5" (1.74 m)  04/07/18 5' 8"  (1.727 m)  04/02/18 5' 8"  (1.727 m)    General appearance: alert, cooperative and appears stated age Head: Normocephalic, without obvious abnormality, atraumatic Neck: no adenopathy, supple, symmetrical, trachea midline and thyroid normal to inspection and palpation Lungs: clear to auscultation bilaterally Cardiovascular: regular rate and rhythm Breasts: normal appearance, no masses or tenderness Abdomen: soft, non-tender; non distended,  no masses,  no organomegaly Extremities: extremities normal, atraumatic, no cyanosis or edema Skin: Skin color, texture, turgor normal. No rashes or lesions Lymph nodes: Cervical, supraclavicular, and axillary nodes normal. No abnormal inguinal nodes palpated Neurologic: Grossly normal   Pelvic: External genitalia:  no lesions              Urethra:  normal appearing urethra with no masses, tenderness or lesions              Bartholins and Skenes: normal                 Vagina: normal appearing vagina with normal color and discharge, no lesions              Cervix: no lesions               Bimanual Exam:  Uterus:  no masses or tenderness,  exam limited by BMI              Adnexa: no mass, fullness, tenderness               Rectovaginal: Confirms               Anus:  normal  sphincter tone, no lesions  Chaperone was present for exam.  A:  Well Woman with normal exam  Multiple medical issues, followed by primary and cardiology  P:   Labs with primary  Pap UTD  Colonoscopy UTD  Discussed breast self exam  Discussed calcium and vit D intake  Mammogram due  Addendum: Thyroid exam should be: goiter, symmetric.  Patient is aware is being evaluated.

## 2018-04-23 ENCOUNTER — Encounter: Payer: Self-pay | Admitting: Obstetrics and Gynecology

## 2018-04-23 ENCOUNTER — Other Ambulatory Visit: Payer: Self-pay

## 2018-04-23 ENCOUNTER — Ambulatory Visit: Payer: BC Managed Care – PPO | Admitting: Obstetrics and Gynecology

## 2018-04-23 VITALS — BP 150/90 | HR 88 | Ht 68.5 in | Wt 310.0 lb

## 2018-04-23 DIAGNOSIS — Z01419 Encounter for gynecological examination (general) (routine) without abnormal findings: Secondary | ICD-10-CM | POA: Diagnosis not present

## 2018-04-23 NOTE — Patient Instructions (Signed)
EXERCISE AND DIET:  We recommended that you start or continue a regular exercise program for good health. Regular exercise means any activity that makes your heart beat faster and makes you sweat.  We recommend exercising at least 30 minutes per day at least 3 days a week, preferably 4 or 5.  We also recommend a diet low in fat and sugar.  Inactivity, poor dietary choices and obesity can cause diabetes, heart attack, stroke, and kidney damage, among others.    ALCOHOL AND SMOKING:  Women should limit their alcohol intake to no more than 7 drinks/beers/glasses of wine (combined, not each!) per week. Moderation of alcohol intake to this level decreases your risk of breast cancer and liver damage. And of course, no recreational drugs are part of a healthy lifestyle.  And absolutely no smoking or even second hand smoke. Most people know smoking can cause heart and lung diseases, but did you know it also contributes to weakening of your bones? Aging of your skin?  Yellowing of your teeth and nails?  CALCIUM AND VITAMIN D:  Adequate intake of calcium and Vitamin D are recommended.  The recommendations for exact amounts of these supplements seem to change often, but generally speaking 1,200 mg of calcium (between diet and supplement) and 800 units of Vitamin D per day seems prudent. Certain women may benefit from higher intake of Vitamin D.  If you are among these women, your doctor will have told you during your visit.    PAP SMEARS:  Pap smears, to check for cervical cancer or precancers,  have traditionally been done yearly, although recent scientific advances have shown that most women can have pap smears less often.  However, every woman still should have a physical exam from her gynecologist every year. It will include a breast check, inspection of the vulva and vagina to check for abnormal growths or skin changes, a visual exam of the cervix, and then an exam to evaluate the size and shape of the uterus and  ovaries.  And after 56 years of age, a rectal exam is indicated to check for rectal cancers. We will also provide age appropriate advice regarding health maintenance, like when you should have certain vaccines, screening for sexually transmitted diseases, bone density testing, colonoscopy, mammograms, etc.   MAMMOGRAMS:  All women over 40 years old should have a yearly mammogram. Many facilities now offer a "3D" mammogram, which may cost around $50 extra out of pocket. If possible,  we recommend you accept the option to have the 3D mammogram performed.  It both reduces the number of women who will be called back for extra views which then turn out to be normal, and it is better than the routine mammogram at detecting truly abnormal areas.    COLON CANCER SCREENING: Now recommend starting at age 45. At this time colonoscopy is not covered for routine screening until 50. There are take home tests that can be done between 45-49.   COLONOSCOPY:  Colonoscopy to screen for colon cancer is recommended for all women at age 50.  We know, you hate the idea of the prep.  We agree, BUT, having colon cancer and not knowing it is worse!!  Colon cancer so often starts as a polyp that can be seen and removed at colonscopy, which can quite literally save your life!  And if your first colonoscopy is normal and you have no family history of colon cancer, most women don't have to have it again for   10 years.  Once every ten years, you can do something that may end up saving your life, right?  We will be happy to help you get it scheduled when you are ready.  Be sure to check your insurance coverage so you understand how much it will cost.  It may be covered as a preventative service at no cost, but you should check your particular policy.      Breast Self-Awareness Breast self-awareness means being familiar with how your breasts look and feel. It involves checking your breasts regularly and reporting any changes to your  health care provider. Practicing breast self-awareness is important. A change in your breasts can be a sign of a serious medical problem. Being familiar with how your breasts look and feel allows you to find any problems early, when treatment is more likely to be successful. All women should practice breast self-awareness, including women who have had breast implants. How to do a breast self-exam One way to learn what is normal for your breasts and whether your breasts are changing is to do a breast self-exam. To do a breast self-exam: Look for Changes  1. Remove all the clothing above your waist. 2. Stand in front of a mirror in a room with good lighting. 3. Put your hands on your hips. 4. Push your hands firmly downward. 5. Compare your breasts in the mirror. Look for differences between them (asymmetry), such as: ? Differences in shape. ? Differences in size. ? Puckers, dips, and bumps in one breast and not the other. 6. Look at each breast for changes in your skin, such as: ? Redness. ? Scaly areas. 7. Look for changes in your nipples, such as: ? Discharge. ? Bleeding. ? Dimpling. ? Redness. ? A change in position. Feel for Changes Carefully feel your breasts for lumps and changes. It is best to do this while lying on your back on the floor and again while sitting or standing in the shower or tub with soapy water on your skin. Feel each breast in the following way:  Place the arm on the side of the breast you are examining above your head.  Feel your breast with the other hand.  Start in the nipple area and make  inch (2 cm) overlapping circles to feel your breast. Use the pads of your three middle fingers to do this. Apply light pressure, then medium pressure, then firm pressure. The light pressure will allow you to feel the tissue closest to the skin. The medium pressure will allow you to feel the tissue that is a little deeper. The firm pressure will allow you to feel the tissue  close to the ribs.  Continue the overlapping circles, moving downward over the breast until you feel your ribs below your breast.  Move one finger-width toward the center of the body. Continue to use the  inch (2 cm) overlapping circles to feel your breast as you move slowly up toward your collarbone.  Continue the up and down exam using all three pressures until you reach your armpit.  Write Down What You Find  Write down what is normal for each breast and any changes that you find. Keep a written record with breast changes or normal findings for each breast. By writing this information down, you do not need to depend only on memory for size, tenderness, or location. Write down where you are in your menstrual cycle, if you are still menstruating. If you are having trouble noticing differences   in your breasts, do not get discouraged. With time you will become more familiar with the variations in your breasts and more comfortable with the exam. How often should I examine my breasts? Examine your breasts every month. If you are breastfeeding, the best time to examine your breasts is after a feeding or after using a breast pump. If you menstruate, the best time to examine your breasts is 5-7 days after your period is over. During your period, your breasts are lumpier, and it may be more difficult to notice changes. When should I see my health care provider? See your health care provider if you notice:  A change in shape or size of your breasts or nipples.  A change in the skin of your breast or nipples, such as a reddened or scaly area.  Unusual discharge from your nipples.  A lump or thick area that was not there before.  Pain in your breasts.  Anything that concerns you.  

## 2018-04-28 ENCOUNTER — Ambulatory Visit: Payer: BC Managed Care – PPO | Admitting: Endocrinology

## 2018-04-28 ENCOUNTER — Ambulatory Visit
Admission: RE | Admit: 2018-04-28 | Discharge: 2018-04-28 | Disposition: A | Discharge status: Home / Self Care | Payer: BC Managed Care – PPO | Source: Ambulatory Visit | Attending: Endocrinology | Admitting: Endocrinology

## 2018-04-28 ENCOUNTER — Other Ambulatory Visit: Payer: Self-pay

## 2018-04-28 DIAGNOSIS — E041 Nontoxic single thyroid nodule: Secondary | ICD-10-CM

## 2018-05-07 ENCOUNTER — Other Ambulatory Visit: Payer: Self-pay

## 2018-05-07 ENCOUNTER — Encounter: Payer: Self-pay | Admitting: Medical

## 2018-05-07 ENCOUNTER — Ambulatory Visit: Payer: BC Managed Care – PPO | Admitting: Medical

## 2018-05-07 VITALS — BP 136/80 | HR 88 | Temp 98.1°F | Resp 16 | Ht 68.0 in | Wt 307.2 lb

## 2018-05-07 DIAGNOSIS — M109 Gout, unspecified: Secondary | ICD-10-CM

## 2018-05-07 DIAGNOSIS — E1165 Type 2 diabetes mellitus with hyperglycemia: Secondary | ICD-10-CM | POA: Diagnosis not present

## 2018-05-07 DIAGNOSIS — D869 Sarcoidosis, unspecified: Secondary | ICD-10-CM

## 2018-05-07 DIAGNOSIS — E049 Nontoxic goiter, unspecified: Secondary | ICD-10-CM | POA: Diagnosis not present

## 2018-05-07 DIAGNOSIS — G4733 Obstructive sleep apnea (adult) (pediatric): Secondary | ICD-10-CM

## 2018-05-07 DIAGNOSIS — Z9989 Dependence on other enabling machines and devices: Secondary | ICD-10-CM

## 2018-05-07 DIAGNOSIS — R05 Cough: Secondary | ICD-10-CM

## 2018-05-07 DIAGNOSIS — I10 Essential (primary) hypertension: Secondary | ICD-10-CM

## 2018-05-07 DIAGNOSIS — K760 Fatty (change of) liver, not elsewhere classified: Secondary | ICD-10-CM

## 2018-05-07 DIAGNOSIS — E559 Vitamin D deficiency, unspecified: Secondary | ICD-10-CM

## 2018-05-07 DIAGNOSIS — E785 Hyperlipidemia, unspecified: Secondary | ICD-10-CM

## 2018-05-07 DIAGNOSIS — Z794 Long term (current) use of insulin: Secondary | ICD-10-CM | POA: Diagnosis not present

## 2018-05-07 DIAGNOSIS — E040 Nontoxic diffuse goiter: Secondary | ICD-10-CM

## 2018-05-07 DIAGNOSIS — R058 Other specified cough: Secondary | ICD-10-CM

## 2018-05-07 LAB — POCT GLYCOSYLATED HEMOGLOBIN (HGB A1C): Hemoglobin A1C: 6.1 % — AB (ref 4.0–5.6)

## 2018-05-07 MED ORDER — ASPIRIN 81 MG PO TABS: 81.0000 mg | ORAL_TABLET | Freq: Every day | ORAL | 3 refills | Status: DC

## 2018-05-07 MED ORDER — ALLOPURINOL 100 MG PO TABS: 100.0000 mg | ORAL_TABLET | Freq: Every day | ORAL | 3 refills | Status: DC

## 2018-05-07 MED ORDER — INSULIN DEGLUDEC 200 UNIT/ML ~~LOC~~ SOPN: 130.0000 [IU] | PEN_INJECTOR | Freq: Every day | SUBCUTANEOUS | 11 refills | Status: DC

## 2018-05-07 MED ORDER — VITAMIN D 25 MCG (1000 UNIT) PO TABS: 1000.0000 [IU] | ORAL_TABLET | Freq: Every day | ORAL | 3 refills | Status: DC

## 2018-05-07 MED ORDER — POTASSIUM CHLORIDE ER 10 MEQ PO TBCR: 10.0000 meq | EXTENDED_RELEASE_TABLET | Freq: Two times a day (BID) | ORAL | 3 refills | Status: DC

## 2018-05-07 MED ORDER — BISOPROLOL FUMARATE 10 MG PO TABS: 10.0000 mg | ORAL_TABLET | Freq: Every day | ORAL | 3 refills | Status: DC

## 2018-05-07 MED ORDER — ROSUVASTATIN CALCIUM 20 MG PO TABS: 20.0000 mg | ORAL_TABLET | Freq: Every day | ORAL | 3 refills | Status: DC

## 2018-05-07 MED ORDER — AMLODIPINE-VALSARTAN-HCTZ 10-320-25 MG PO TABS: 1.0000 | ORAL_TABLET | Freq: Every day | ORAL | 3 refills | Status: DC

## 2018-05-07 NOTE — Progress Notes (Addendum)
This visit type was conducted due to national recommendations for restrictions regarding the COVID-19 Pandemic (e.g. social distancing) in an effort to limit this patient's exposure and mitigate transmission in our community.  Due to their co-morbid illnesses, this patient is at least at moderate risk for complications without adequate follow up.  This format is felt to be most appropriate for this patient at this time.    Documentation for virtual audio and video telecommunications through Zoom encounter:  The patient was located at home. The provider was located in the office. The patient did consent to this visit and is aware of possible charges through their insurance for this visit.  The other persons participating in this telemedicine service were none. Time spent on call was 35 minutes and in review of previous records >45 minutes total.  This virtual service is not related to other E/M service within previous 7 days.  Subjective: Chief Complaint  Patient presents with  . med check   Medical team Dr. Terrence Dupont, cardiology Dr. Renato Shin and Dr. Philemon Kingdom, endocrinology Dr. Christinia Gully, pulmonology  Dr. Sumner Boast, gynecology Louanna Vanliew, Camelia Eng, PA-C here for primary care  Hypertension- compliant with amlodipine valsartan HCTZ 10/3 20/25 mg daily, bisoprolol 10 mg daily, clonidine 0.2 mg twice daily.  No current dyspnea, no chest pain, no SOB.  Prior eval with Dr. Terrence Dupont, cariology.  Hypokalemia-taking Klor-Con 10 mEq twice daily, related to diuretic  Taking gabapentin 100 mg daily at bedtime  Diabetes-taking metformin 1000 mg twice daily, Tresiba injection 130 units nightly, Ozempic 66m weekly.  Managed by endocrinology.   This morning glucose 115. Glucose numbers in January were still in the high 100s- low 200s, but gradually numbers have come down to 100s.  The last month her numbers have been in good control.    hyperlipidemia -been out of Crestor 254mdaily,but  taking ASA 8138maily  Gout- taking allopurinol 100 mg daily  GERD - taking Protonix and Pepcid  Vit D deficiency - taking Vit D 1000 u daily  sarcoidosis - sees Dr. WerMelvyn Novaslmonology, recent visit notes reviewed  Exercising a little with walking  Compliant with CPAP  Currently on long term disability  Past Medical History:  Diagnosis Date  . Back pain   . Diabetes mellitus without complication ([place] . Goiter    left side, saw dr Jewel Mcafee april 2015 for, had biopsy done was told it is cyst  . H/O echocardiogram    [facility]  . Hyperlipidemia   . Hypertension 2000  . Impaired fasting blood sugar 2014  . Obesity   . Other screening mammogram    planned for 05/2013  . Routine gynecological examination    last pap 2012  . Sarcoid   . Sleep apnea    on CPAP setting of 2     Current Outpatient Medications on File Prior to Visit  Medication Sig Dispense Refill  . Blood Glucose Monitoring Suppl (ONE TOUCH ULTRA 2) w/Device KIT 1 Device by Does not apply route 2 (two) times daily. 1 each 0  . cloNIDine (CATAPRES) 0.2 MG tablet Take 1 tablet (0.2 mg total) by mouth 3 (three) times daily. 90 tablet 11  . famotidine (PEPCID) 20 MG tablet One at bedtime 30 tablet 11  . gabapentin (NEURONTIN) 100 MG capsule Take 1 capsule (100 mg total) by mouth at bedtime. 90 capsule 3  . glucose blood test strip Checks sugars 4 times daily, on meal time  insulin 100 each 11  . Insulin Pen Needle 32G X 4 MM MISC Use 4x a day 200 each 6  . Lancets (ONETOUCH DELICA PLUS [id number]) MISC 100 PENS BY DOES NOT APPLY ROUTE 2 (TWO) TIMES DAILY. 100 each 3  . metFORMIN (GLUCOPHAGE) 1000 MG tablet Take 1 tablet (1,000 mg total) by mouth 2 (two) times daily with a meal. 180 tablet 3  . OZEMPIC, 1 MG/DOSE, 2 MG/1.5ML SOPN INJECT 1 MG INTO THE SKIN ONCE A WEEK. 9 pen 0  . pantoprazole (PROTONIX) 40 MG tablet Take 1 tablet (40 mg total) by mouth daily. Take 30-60 min  before first meal of the day 30 tablet 2   No current facility-administered medications on file prior to visit.    ROS as in subjective   Objective: BP 136/80   Pulse 88   Temp 98.1 F (36.7 C) (Oral)   Resp 16   Ht 5' 8"  (1.727 m)   Wt (!) 307 lb 3.2 oz (139.3 kg)   LMP 11/09/2013   SpO2 96%   BMI 46.71 kg/m   BP Readings from Last 3 Encounters:  05/07/18 136/80  04/23/18 (!) 150/90  04/07/18 (!) 144/84   Wt Readings from Last 3 Encounters:  05/07/18 (!) 307 lb 3.2 oz (139.3 kg)  04/23/18 (!) 310 lb (140.6 kg)  04/07/18 (!) 309 lb 9.6 oz (140.4 kg)   General appearance: alert, no distress, WD/WN,  otherwise not examined to limit contact given the pandemic   Assessment: Encounter Diagnoses  Name Primary?  . Type 2 diabetes mellitus with hyperglycemia, with long-term current use of insulin ([place]) Yes  . Essential hypertension, benign   . OSA on CPAP   . Diffuse goiter   . Fatty liver   . Hyperlipidemia, unspecified hyperlipidemia type   . Gout, unspecified cause, unspecified chronicity, unspecified site   . Vitamin D deficiency   . Sarcoidosis   . Morbid obesity due to excess calories ([place])   . Upper airway cough syndrome      Plan: Reviewed cancer screening including colonoscopy 2015, mammogram 2018, Pap smear February 2019   Recommended updated mammogram this year likely no sooner than late May or into June once the coronavirus pandemic hopefully is resolving  Obesity - congratulated on losing some pounds.   Strongly advised Weight Watchers vs [employer] book, and she is still considering bariatric clinic consult after the pandemic seems to be over.  Thyroid goiter, hx/o thyroid nodule -thyroid ultrasound done in 04/28/2018 was stable, showing thyromegaly with heterogeneous parenchyma, isthmic nodule, previously biopsied, no indication for biopsy at that time.   This was reviewed by Dr. Loanne Drilling.  Plan to repeat ultrasound in a year or as determined by  endocrinology.  I reviewed thyroid labs from January.  Diabetes- I reviewed her endocrinology note with Dr. Loanne Drilling from January and March.  Continue Ozempic 75m weekly, metformin 10030mBID, continue Tresiba long-acting insulin 130 units daily  Kidneys- creatinine and micro albumin labs in January and February 2020 normal, is on an ARB.  Prior elevation of alkaline phosphatase-normal on labs from January 2020 in April 2019  Sarcoidosis- saw Dr. WeMelvyn Novasn April 02, 2018, taken off prednisone, doubt any active disease of sarcoid at this time  HTN - continue Exforge 10/320/254maily, Bisoprolol 77m31mily, Clonidine 0.2 tablet BID.    Gout - compliant with Allopurinol 100mg47mly.   Lab today for uric acid.  hyperlipidemia - apologized for miscommunication, restart Crestor, c/t aspiring  OSA - compliant with OSA  Fatty liver - counseled on need for weight loss, low fat diet.  I reviewed her January 2018 abdominal ultrasound showing fatty liver disease.  Next steps: Needs Vit D and lipid lab in 3 months Will get mammogram in late May, early June C/t routine f/u with endocrinology Decreased pedal pulses - consider ABIs.   Amy Rosario was seen today for med check.  Diagnoses and all orders for this visit:  Type 2 diabetes mellitus with hyperglycemia, with long-term current use of insulin ([facility]) -     HgB A1c  Essential hypertension, benign  OSA on CPAP  Diffuse goiter  Fatty liver  Hyperlipidemia, unspecified hyperlipidemia type  Gout, unspecified cause, unspecified chronicity, unspecified site  Vitamin D deficiency  Sarcoidosis  Morbid obesity due to excess calories ([place])  Upper airway cough syndrome  Other orders -     rosuvastatin (CRESTOR) 20 MG tablet; Take 1 tablet (20 mg total) by mouth daily. -     potassium chloride (KLOR-CON 10) 10 MEQ tablet; Take 1 tablet (10 mEq total) by mouth 2 (two) times daily. -     cholecalciferol (VITAMIN D3) 25 MCG (1000 UT) tablet;  Take 1 tablet (1,000 Units total) by mouth daily. -     bisoprolol (ZEBETA) 10 MG tablet; Take 1 tablet (10 mg total) by mouth daily. -     aspirin 81 MG tablet; Take 1 tablet (81 mg total) by mouth daily. -     amLODIPine-Valsartan-HCTZ (EXFORGE HCT) 10-320-25 MG TABS; Take 1 tablet by mouth daily. -     allopurinol (ZYLOPRIM) 100 MG tablet; Take 1 tablet (100 mg total) by mouth daily. -     Insulin Degludec (TRESIBA FLEXTOUCH) 200 UNIT/ML SOPN; Inject 130 Units into the skin daily.

## 2018-06-02 ENCOUNTER — Encounter: Payer: Self-pay | Admitting: Internal Medicine

## 2018-06-02 ENCOUNTER — Other Ambulatory Visit: Payer: Self-pay

## 2018-06-02 ENCOUNTER — Ambulatory Visit: Payer: BC Managed Care – PPO | Admitting: Internal Medicine

## 2018-06-02 DIAGNOSIS — R05 Cough: Secondary | ICD-10-CM

## 2018-06-02 DIAGNOSIS — D869 Sarcoidosis, unspecified: Secondary | ICD-10-CM | POA: Diagnosis not present

## 2018-06-02 DIAGNOSIS — R058 Other specified cough: Secondary | ICD-10-CM

## 2018-06-02 NOTE — Patient Instructions (Signed)
Weight control is simply a matter of calorie balance which needs to be tilted in your favor by eating less and exercising more.  To get the most out of exercise, you need to be continuously aware that you are short of breath, but never out of breath, for 30 minutes daily. As you improve, it will actually be easier for you to do the same amount of exercise  in  30 minutes so always push to the level where you are short of breath.     Think of your calorie balance like you do your bank account where in this case you want the balance to go down so you must take in less calories than you burn up.  It's just that simple:  Hard to do, but easy to understand.  Good luck!     If you are satisfied with your treatment plan,  let your doctor know and he/she can either refill your medications or you can return here when your prescription runs out.     If in any way you are not 100% satisfied,  please tell us.  If 100% better, tell your friends!  Pulmonary follow up is as needed

## 2018-06-02 NOTE — Progress Notes (Signed)
Subjective:    Patient ID: Amy Rosario, female   DOB: [birth year]     MRN: [id number]    Brief patient profile:  54 yobf with MO never smoker  Presented sob/abn lfts Feb 2018 > ? dx Sarcoid > referred to pulmonary clinic 04/18/2016 by Elray Mcgregor PA  - FOB 07/05/16> severe diffuse cobblestoning :  Endobronchial bx:  NCG    Last worked in Mar 25 2016   History of Present Illness  04/18/2016 1st [facility] office visit/ Fenix Rorke   Chief Complaint  Patient presents with  . Pulmonary Consult    Referred by Dr. Chana Bode for eval of Sarcoidosis. Pt states she was just dxed recently. She started having SOB in mid Feb 2018.  She gets winded walking short distances such as to her mailbox and back.  She also c/o cough- prod at times with minimal yellow to clear sputum. She states she has recently noticed bumps on her face.   onset of sob was indolent really since first of 2018 assoc with abd swelling and  mostly dry cough then noted "red bumps" on face and some aching of elbows but not ankles or wrists. No viz symptoms/ some anorexia but min wt loss  Doe = MMRC1 = can walk nl pace, flat grade, can't hurry or go uphills or steps s sob   rec Sarcoidosis is a benign inflammatory condition  Treatment is generally reserved for patients with major symptoms  schedule dermatology for skin biopsy> neg for sarcoid     07/03/2016  f/u ov/Dontavious Emily re: sarcoidosis  Chief Complaint  Patient presents with  . Follow-up    Pt c/o stiffness in her neck in the am's.  She also c/o swelling in both hands.   much better on pred, much worse off in terms of all her arthritis symptoms esp L hand / wrist  Doe continues at level = MMRC1 rec - FOB 07/05/16> severe diffuse cobblestoning :  Endobronchial bx:  NCG rec pred 20 mg daily until better then 10 mg daily and f/u in one month   08/17/2016  f/u ov/Cypress Fanfan re:  Sarcoid with prominent airway involvement on pred 10 mg x one week s flare  Chief Complaint  Patient  presents with  . Follow-up    Arthritic pain improved on pred. Breathing is doing well and no new co's today.    main symptom arthritis elbows/ hands resolved/  Not limited by breathing from desired activities  But not very active rec Take zebeta (10 mg) each am and continue clonidine 0.2 three times day  Prednisone ceiling of 20 mg per day and floor of 5 mg with breakfast      05/03/2017  f/u ov/Matvey Llanas re: sarcoid f/u 5 mg qod  Chief Complaint  Patient presents with  . Pulmonary follow up    productive Cough  clear in color since 04/29/17, Has felt like the right side of her neck is swollen and sore for since 04/26/17    Dyspnea: not limiting Cough: 04/29/17 acute onset p pfts-otherwise was doing fine  / cough is day > noct, min mucoid  Sleep: noct "wheeze" rec Prednisone ceiling is 20 mg per day until coughing / breathing better then taper to floor of 5 mg every other day  Pantoprazole (protonix) 40 mg   Take  30-60 min before first meal of the day and Pepcid (famotidine)  20 mg one @  bedtime until cough is gone for a week  Then try  off GERD diet  For cough > tessalon 200 mg every 8 hours as needed     08/05/2017  f/u ov/Africa Masaki re: sarcoidosis  On pred 5 mg daily (did not yet taper to 5 mg qod as rec)  Chief Complaint  Patient presents with  . Follow-up    Breathing has improved some since the last visit.   dyspnea :   MMRC1 = can walk nl pace, flat grade, can't hurry or go uphills or steps s sob   Cough better  rec Try prednisone 5 mg every other day if your breathing and cough are under good control and if they get worse ok to increase to a maxmum of 20 mg per day and then work back to the lowest dose where you are doing well  Please schedule a follow up office visit in 4 weeks, sooner if needed  with all medications /inhalers/ solutions in hand so we can verify exactly what you are taking. This includes all medications from all doctors and over the counters  PFTs on return      01/15/2018  f/u ov/Candas Deemer re: sarcoidosis on 5 mg qod/no meds Chief Complaint  Patient presents with  . Follow-up    PFT's done today. Breathing is about the same. She has good days and bad days.    Dyspnea:  Walking 30 min  3 x weekly stops half way due to sob   Cough: min / dry / / tessalon works  Sleeping: bed flat/ 2 pillows  SABA use: no  02:  Has concentrator 2lpm  X years did not disclose this previously ([facility])  rec Prednsione 5 mg alternating 2.5 mg every other day  thru the holidays then 2.5 mg every other day for a month then stop it - the lowest dose that works is right dose  Please schedule a follow up office visit in 8 weeks, call sooner if needed with cxr on return   . 04/02/2018  f/u ov/Iram Lundberg re: sarcoid, no worse  On pred 2.5 mg qod (did not try off as rec) Chief Complaint  Patient presents with  . Follow-up    CXR done today. Breathing is unchanged. She has occ cough- non prod.   Dyspnea:  Stopped doing regular walking  Cough: same Lavella Lemons works Sleeping: bed is flat/ 1-2 pillows  SABA use: none 02: 2lpm hs x 10 y per [place]  rec No more prednisone for now Let Elray Mcgregor know if your sugar is falling off the prednisone Please remember to go to the lab department   for your tests - we will call you with the results when they are available.   06/02/2018  f/u ov/Nayali Talerico re: sarcoid f/u off pred since 04/02/18  Chief Complaint  Patient presents with  . Follow-up    Breathing is about the same. She has occ dry cough.   Dyspnea:  Legs get tired first/around the block x 10 min some hills s stopping if anything better not worse Cough: minimal  Hs  Takes "pill" 3 x daily avg ? Tessalon (not sure)  Sleeping: bed is flat/ 2 pillows / no resp symptoms SABA use: none 02:  2 lpm concentrator hs only   No obvious day to day or daytime variability or assoc excess/ purulent sputum or mucus plugs or hemoptysis or cp or chest tightness, subjective wheeze or overt  sinus or hb symptoms.   Sleeping  without nocturnal  or early am exacerbation  of respiratory  c/o's or  need for noct saba. Also denies any obvious fluctuation of symptoms with weather or environmental changes or other aggravating or alleviating factors except as outlined above   No unusual exposure hx or h/o childhood pna/ asthma or knowledge of premature birth.  Current Allergies, Complete Past Medical History, Past Surgical History, Family History, and Social History were reviewed in [employer] record.  ROS  The following are not active complaints unless bolded Hoarseness, sore throat, dysphagia, dental problems, itching, sneezing,  nasal congestion or discharge of excess mucus or purulent secretions, ear ache,   fever, chills, sweats, unintended wt loss or wt gain, classically pleuritic or exertional cp,  orthopnea pnd or arm/hand swelling  or leg swelling, presyncope, palpitations, abdominal pain, anorexia, nausea, vomiting, diarrhea  or change in bowel habits or change in bladder habits, change in stools or change in urine, dysuria, hematuria,  rash, arthralgias, visual complaints, headache, numbness, weakness or ataxia or problems with walking or coordination,  change in mood or  memory.        Current Meds - - NOTE:   Unable to verify as accurately reflecting what pt takes   as she is not sure of names and did not bring them as requested   Medication Sig  . allopurinol (ZYLOPRIM) 100 MG tablet Take 1 tablet (100 mg total) by mouth daily.  Marland Kitchen amLODIPine-Valsartan-HCTZ (EXFORGE HCT) 10-320-25 MG TABS Take 1 tablet by mouth daily.  Marland Kitchen aspirin 81 MG tablet Take 1 tablet (81 mg total) by mouth daily.  . bisoprolol (ZEBETA) 10 MG tablet Take 1 tablet (10 mg total) by mouth daily.  . Blood Glucose Monitoring Suppl (ONE TOUCH ULTRA 2) w/Device KIT 1 Device by Does not apply route 2 (two) times daily.  . cholecalciferol (VITAMIN D3) 25 MCG (1000 UT) tablet Take 1 tablet  (1,000 Units total) by mouth daily.  . cloNIDine (CATAPRES) 0.2 MG tablet Take 1 tablet (0.2 mg total) by mouth 3 (three) times daily.  . famotidine (PEPCID) 20 MG tablet One at bedtime  . gabapentin (NEURONTIN) 100 MG capsule Take 1 capsule (100 mg total) by mouth at bedtime.  Marland Kitchen glucose blood test strip Checks sugars 4 times daily, on meal time insulin  . Insulin Degludec (TRESIBA FLEXTOUCH) 200 UNIT/ML SOPN Inject 130 Units into the skin daily.  . Insulin Pen Needle 32G X 4 MM MISC Use 4x a day  . Lancets (ONETOUCH DELICA PLUS [id number]) MISC 100 PENS BY DOES NOT APPLY ROUTE 2 (TWO) TIMES DAILY.  . metFORMIN (GLUCOPHAGE) 1000 MG tablet Take 1 tablet (1,000 mg total) by mouth 2 (two) times daily with a meal.  . OZEMPIC, 1 MG/DOSE, 2 MG/1.5ML SOPN INJECT 1 MG INTO THE SKIN ONCE A WEEK.  . pantoprazole (PROTONIX) 40 MG tablet Take 1 tablet (40 mg total) by mouth daily. Take 30-60 min before first meal of the day  . potassium chloride (KLOR-CON 10) 10 MEQ tablet Take 1 tablet (10 mEq total) by mouth 2 (two) times daily.  . rosuvastatin (CRESTOR) 20 MG tablet Take 1 tablet (20 mg total) by mouth daily.                      Objective:   Physical Exam  Obese pleasant amb bf nad  06/02/2018        310  04/02/2018        307  01/15/2018      304  08/05/2017  305 05/03/2017        309  01/21/2017      312  12/03/2016      302  09/28/2016        290  08/17/2016        284  07/03/2016        276   06/04/2016       267   04/18/16 263 lb (119.3 kg)  03/26/16 264 lb 6.4 oz (119.9 kg)  03/20/16 263 lb (119.3 kg)   9/6/ 2017        295    Vital signs reviewed - Note on arrival 02 sats  99% on RA       golf ball sided goiter R s deviation  HEENT: nl dentition, turbinates bilaterally, and oropharynx. Nl external ear canals without cough reflex   NECK :  without JVD/Nodes  nl carotid upstrokes bilaterally - gold ball sized goiter R neck s trachal deviation   LUNGS: no acc muscle  use,  Nl contour chest which is clear to A and P bilaterally without cough on insp or exp maneuvers   CV:  RRR  no s3 or murmur or increase in P2, and no edema   ABD:  Obese/ soft and nontender with nl inspiratory excursion in the supine position. No bruits or organomegaly appreciated, bowel sounds nl  MS:  Nl gait/ ext warm without deformities, calf tenderness, cyanosis or clubbing No obvious joint restrictions   SKIN: warm and dry without lesions    NEURO:  alert, approp, nl sensorium with  no motor or cerebellar deficits apparent.         Assessment:

## 2018-06-05 ENCOUNTER — Encounter: Payer: Self-pay | Admitting: Internal Medicine

## 2018-06-05 NOTE — Assessment & Plan Note (Signed)
ERV 04/29/17 = 46% c/w body habitus   Body mass index is 48.55 kg/m.  -  trending up Lab Results  Component Value Date   TSH 3.570 02/24/2018     Contributing to gerd risk/ doe/reviewed the need and the process to achieve and maintain neg calorie balance > defer f/u primary care including intermittently monitoring thyroid status     I had an extended summary /finaldiscussion with the patient reviewing all relevant studies completed to date and  lasting 15 to 20 minutes of a 25 minute visit    Each maintenance medication was reviewed in detail including most importantly the difference between maintenance and prns and under what circumstances the prns are to be triggered using an action plan format that is not reflected in the computer generated alphabetically organized AVS.     Please see AVS for specific instructions unique to this visit that I personally wrote and verbalized to the the pt in detail and then reviewed with pt  by my nurse highlighting any  changes in therapy recommended at today's visit to their plan of care.

## 2018-06-05 NOTE — Assessment & Plan Note (Signed)
Onset late 2017 with sob/  abn labs  Labs 03/08/16 :  Ca 12.4 with TP 8.8 and alb 3.9  - Stopped working 03/23/16 (notified pulmonary office 09/26/2017  And requested paperwork completion) ESR 04/18/2016 =  90  - Derm eval rec  04/19/2016   > done 05/31/16  Dx bulous impetigo / neg for NCG - ESR 06/04/2016  119 >  Prednisone 10 mg take  4 each am x 2 days,   2 each am x 2 days,  1 each am x 2 days and stop  - PFT's  06/04/2016  FVC  2.46 (73%)  No obst and no improvement from saba p nothing  prior to study with DLCO  64/63 % corrects to 105  % for alv volume   - ACE level 06/04/2016 = 260  - FOB 07/05/16> severe diffuse cobblestoning :  Endobronchial bx:  NCG rec pred 20 mg daily until better then 10 mg daily   - 08/17/2016 improved so taper to 5 mg daily as floor  - ESR 09/28/2016 down to 35/angiotensin 94  on 10 mg daily s symptoms> rec ceiling 20/ floor 5 mg daily  - referred to ophthalmology 09/28/2016 > neg  - PFT's  04/29/2017  FVC 2.26 (67%)   with DLCO  67 % corrects to 116  % for alv volume  And ERV 46% - PFT's  01/15/2018  FVC 2.31 (69%)  no obst  With nothing prior to study with DLCO  68 % corrects to 117  % for alv volume on 5 mg qod so rec taper to 2.5 qod   -  off prednisone 04/02/2018 with nl cxr, nl calcium and prot/alb  7.5/4.4 on 2.5 mg qod - 06/02/2018   Walked RA  2 laps @  approx 282f each @ avg pace  stopped due to end of study, min sob @ wt 310lb    No evidence active dz at present, f/u can be prn but must return with all meds in hand using a trust but verify approach to confirm accurate Medication  Reconciliation The principal here is that until we are certain that the  patients are doing what we've asked, it makes no sense to ask them to do more.

## 2018-06-05 NOTE — Assessment & Plan Note (Signed)
Has improved as says she can control it "with a pill" but not clear what she's talking about as not listed - if not satisfied return with all meds

## 2018-06-06 ENCOUNTER — Other Ambulatory Visit: Payer: Self-pay | Admitting: Internal Medicine

## 2018-06-14 ENCOUNTER — Other Ambulatory Visit: Payer: Self-pay | Admitting: Endocrinology

## 2018-06-14 NOTE — Telephone Encounter (Signed)
Please refill x 1 F/u is due  

## 2018-06-15 ENCOUNTER — Other Ambulatory Visit: Payer: Self-pay | Admitting: Internal Medicine

## 2018-07-10 ENCOUNTER — Other Ambulatory Visit: Payer: Self-pay | Admitting: Endocrinology

## 2018-07-10 DIAGNOSIS — Z0289 Encounter for other administrative examinations: Secondary | ICD-10-CM

## 2018-07-11 ENCOUNTER — Ambulatory Visit (INDEPENDENT_AMBULATORY_CARE_PROVIDER_SITE_OTHER): Payer: BC Managed Care – PPO | Admitting: Endocrinology

## 2018-07-11 ENCOUNTER — Other Ambulatory Visit: Payer: Self-pay

## 2018-07-11 ENCOUNTER — Encounter: Payer: Self-pay | Admitting: Endocrinology

## 2018-07-11 VITALS — BP 134/84 | HR 83 | Temp 98.3°F | Wt 311.4 lb

## 2018-07-11 DIAGNOSIS — E1165 Type 2 diabetes mellitus with hyperglycemia: Secondary | ICD-10-CM | POA: Diagnosis not present

## 2018-07-11 DIAGNOSIS — Z794 Long term (current) use of insulin: Secondary | ICD-10-CM | POA: Diagnosis not present

## 2018-07-11 LAB — POCT GLYCOSYLATED HEMOGLOBIN (HGB A1C): Hemoglobin A1C: 7.5 % — AB (ref 4.0–5.6)

## 2018-07-11 MED ORDER — INSULIN DEGLUDEC 200 UNIT/ML ~~LOC~~ SOPN: 130.0000 [IU] | PEN_INJECTOR | Freq: Every day | SUBCUTANEOUS | 2 refills | Status: DC

## 2018-07-11 NOTE — Patient Instructions (Addendum)
We should recheck the thyroid ultrasound next year.   Please continue the same medications for diabetes.   Please continue to consider the weight loss surgery.  It is the best treatment for your diabetes.   check your blood sugar twice a day.  vary the time of day when you check, between before the 3 meals, and at bedtime.  also check if you have symptoms of your blood sugar being too high or too low.  please keep a record of the readings and bring it to your next appointment here (or you can bring the meter itself).  You can write it on any piece of paper.  please call us sooner if your blood sugar goes below 70, or if you have a lot of readings over 200. Please come back for a follow-up appointment in 2 months.

## 2018-07-11 NOTE — Progress Notes (Signed)
Subjective:    Patient ID: Amy Rosario, female    DOB: 04/02/1960, 58 y.o.   MRN: [id number]  HPI Pt returns for f/u of diabetes mellitus: DM type: Insulin-requiring type 2.   Dx'ed: 2015.  Complications: none Therapy: insulin since 2017, ozempic, and metformin.  GDM: never DKA: never Severe hypoglycemia: never Pancreatitis: never.  Pancreatic imaging: normal on 2018 [place].  Other: she takes QD insulin, after poor results with multiple daily injections; she takes intermitt prednisone for sarcoidosis.   Interval history: no recent steroids.  no cbg record, but states cbg's are in the 100's.  It is in general higher as the day goes on. pt states she feels well in general.   Pt also has MNG (dx'ed 2015; bx in 2020 showed Beth Cat 3, and affirma reported insuff material; f/u US in 2020 was unchanged).    Past Medical History:  Diagnosis Date  . Back pain   . Diabetes mellitus without complication ([place])   . Goiter    left side, saw dr tysinger april 2015 for, had biopsy done was told it is cyst  . H/O echocardiogram    Dr. Charolette Forward, [facility]  . Hyperlipidemia   . Hypertension 2000  . Impaired fasting blood sugar 2014  . Obesity   . Other screening mammogram    planned for 05/2013  . Routine gynecological examination    last pap 2012  . Sarcoid   . Sleep apnea    on CPAP setting of 2    Past Surgical History:  Procedure Laterality Date  . COLONOSCOPY WITH PROPOFOL N/A 09/21/2013   Procedure: COLONOSCOPY WITH PROPOFOL;  Surgeon: Juanita Craver, MD;  Location: [facility];  Service: Endoscopy;  Laterality: N/A;  . CYST EXCISION     right low back  . TUBAL LIGATION    . VIDEO BRONCHOSCOPY Bilateral 07/05/2016   Procedure: VIDEO BRONCHOSCOPY WITH FLUORO;  Surgeon: Tanda Rockers, MD;  Location: [facility];  Service: Endoscopy;  Laterality: Bilateral;    Social History   Socioeconomic History  . Marital status: Single    Spouse name: Not on  file  . Number of children: Not on file  . Years of education: Not on file  . Highest education level: Not on file  Occupational History  . Not on file  Social Needs  . Financial resource strain: Not on file  . Food insecurity:    Worry: Not on file    Inability: Not on file  . Transportation needs:    Medical: Not on file    Non-medical: Not on file  Tobacco Use  . Smoking status: Never Smoker  . Smokeless tobacco: Never Used  Substance and Sexual Activity  . Alcohol use: No  . Drug use: No  . Sexual activity: Not Currently    Partners: Male    Birth control/protection: Post-menopausal, None  Lifestyle  . Physical activity:    Days per week: Not on file    Minutes per session: Not on file  . Stress: Not on file  Relationships  . Social connections:    Talks on phone: Not on file    Gets together: Not on file    Attends religious service: Not on file    Active member of club or organization: Not on file    Attends meetings of clubs or organizations: Not on file    Relationship status: Not on file  . Intimate partner violence:    Fear of  current or ex partner: Not on file    Emotionally abused: Not on file    Physically abused: Not on file    Forced sexual activity: Not on file  Other Topics Concern  . Not on file  Social History Narrative   Lives with sister Amy Rosario, single, 3 sons, Exercise -walks a lot on the job, Consulting civil engineer at [employer], visit different churches    Current Outpatient Medications on File Prior to Visit  Medication Sig Dispense Refill  . allopurinol (ZYLOPRIM) 100 MG tablet Take 1 tablet (100 mg total) by mouth daily. 90 tablet 3  . amLODIPine-Valsartan-HCTZ (EXFORGE HCT) 10-320-25 MG TABS Take 1 tablet by mouth daily. 90 tablet 3  . aspirin 81 MG tablet Take 1 tablet (81 mg total) by mouth daily. 90 tablet 3  . bisoprolol (ZEBETA) 10 MG tablet Take 1 tablet (10 mg total) by mouth daily. 90 tablet 3  . Blood Glucose Monitoring  Suppl (ONE TOUCH ULTRA 2) w/Device KIT 1 Device by Does not apply route 2 (two) times daily. 1 each 0  . cholecalciferol (VITAMIN D3) 25 MCG (1000 UT) tablet Take 1 tablet (1,000 Units total) by mouth daily. 90 tablet 3  . cloNIDine (CATAPRES) 0.2 MG tablet Take 1 tablet (0.2 mg total) by mouth 3 (three) times daily. 90 tablet 11  . famotidine (PEPCID) 20 MG tablet One at bedtime 30 tablet 11  . gabapentin (NEURONTIN) 100 MG capsule Take 1 capsule (100 mg total) by mouth at bedtime. 90 capsule 3  . glucose blood test strip Checks sugars 4 times daily, on meal time insulin 100 each 11  . Insulin Pen Needle (BD PEN NEEDLE NANO U/F) 32G X 4 MM MISC USE 4X A DAY 200 each 6  . Lancets (ONETOUCH DELICA PLUS [id number]) MISC 100 PENS BY DOES NOT APPLY ROUTE 2 (TWO) TIMES DAILY. 100 each 3  . metFORMIN (GLUCOPHAGE) 1000 MG tablet Take 1 tablet (1,000 mg total) by mouth 2 (two) times daily with a meal. 180 tablet 3  . OZEMPIC, 1 MG/DOSE, 2 MG/1.5ML SOPN INJECT 1 MG INTO THE SKIN ONCE A WEEK. 3 pen 0  . pantoprazole (PROTONIX) 40 MG tablet Take 1 tablet (40 mg total) by mouth daily. Take 30-60 min before first meal of the day 30 tablet 2  . potassium chloride (KLOR-CON 10) 10 MEQ tablet Take 1 tablet (10 mEq total) by mouth 2 (two) times daily. 180 tablet 3  . rosuvastatin (CRESTOR) 20 MG tablet Take 1 tablet (20 mg total) by mouth daily. 90 tablet 3   No current facility-administered medications on file prior to visit.     Allergies  Allergen Reactions  . Penicillins Hives and Swelling    Has patient had a PCN reaction causing immediate rash, facial/tongue/throat swelling, SOB or lightheadedness with hypotension: Yes Has patient had a PCN reaction causing severe rash involving mucus membranes or skin necrosis: No Has patient had a PCN reaction that required hospitalization: No Has patient had a PCN reaction occurring within the last 10 years: No If all of the above answers are "NO", then may proceed  with Cephalosporin use.    Family History  Problem Relation Age of Onset  . Heart disease Father   . Hypertension Father   . Heart disease Brother   . Cancer Neg Hx   . Stroke Neg Hx   . Diabetes Neg Hx     BP 134/84 (BP Location: Right Arm, Patient Position: Sitting, Cuff Size:  Large)   Pulse 83   Temp 98.3 F (36.8 C) (Oral)   Wt (!) 311 lb 6.4 oz (141.3 kg)   LMP 11/09/2013   SpO2 95%   BMI 48.77 kg/m    Review of Systems She denies hypoglycemia.      Objective:   Physical Exam VITAL SIGNS:  See vs page GENERAL: no distress Pulses: dorsalis pedis intact bilat.   MSK: no deformity of the feet CV: trace bilat leg edema Skin:  no ulcer on the feet.  normal color and temp on the feet. Neuro: sensation is intact to touch on the feet.    A1c=7.5%.  Lab Results  Component Value Date   CREATININE 0.97 04/02/2018   BUN 14 04/02/2018   NA 140 04/02/2018   K 3.1 (L) 04/02/2018   CL 98 04/02/2018   CO2 31 04/02/2018       Assessment & Plan:  Insulin-requiring type 2 DM: this is the best control this pt should aim for, given this regimen, which does match insulin to her changing needs throughout the day Edema: This limits rx options MNG: we discussed.  She can wait until next year for f/u US   Patient Instructions  We should recheck the thyroid ultrasound next year.   Please continue the same medications for diabetes.   Please continue to consider the weight loss surgery.  It is the best treatment for your diabetes.   check your blood sugar twice a day.  vary the time of day when you check, between before the 3 meals, and at bedtime.  also check if you have symptoms of your blood sugar being too high or too low.  please keep a record of the readings and bring it to your next appointment here (or you can bring the meter itself).  You can write it on any piece of paper.  please call us sooner if your blood sugar goes below 70, or if you have a lot of readings over 200.  Please come back for a follow-up appointment in 2 months.

## 2018-07-25 ENCOUNTER — Other Ambulatory Visit: Payer: Self-pay | Admitting: Internal Medicine

## 2018-07-25 DIAGNOSIS — R05 Cough: Secondary | ICD-10-CM

## 2018-07-25 DIAGNOSIS — R058 Other specified cough: Secondary | ICD-10-CM

## 2018-08-04 ENCOUNTER — Other Ambulatory Visit: Payer: Self-pay | Admitting: Endocrinology

## 2018-08-28 ENCOUNTER — Other Ambulatory Visit: Payer: Self-pay | Admitting: Endocrinology

## 2018-08-30 IMAGING — US US ABDOMEN COMPLETE
1 series · 13 of 25 positions shown · non-contrast
Comparison: None.

CLINICAL DATA: Fatty liver

EXAM:
ABDOMEN ULTRASOUND COMPLETE

[Series 1: us abdomen complete · 0.32mm/px · 13 of 85 slices shown]
[im 1/85]
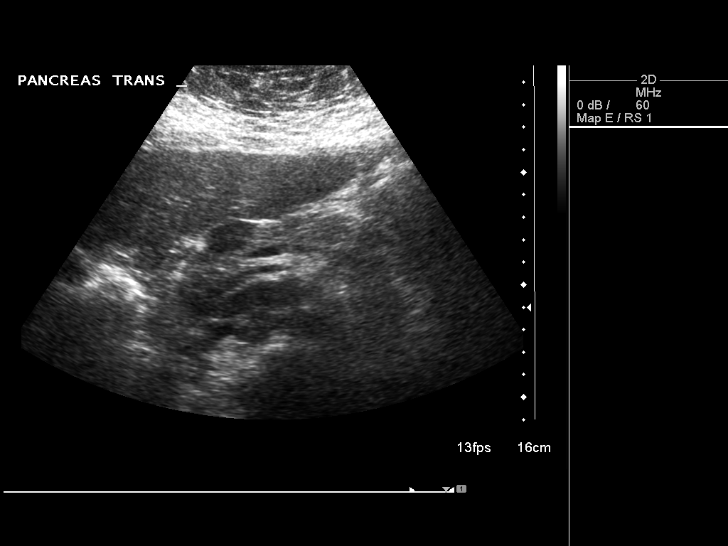
[im 8/85]
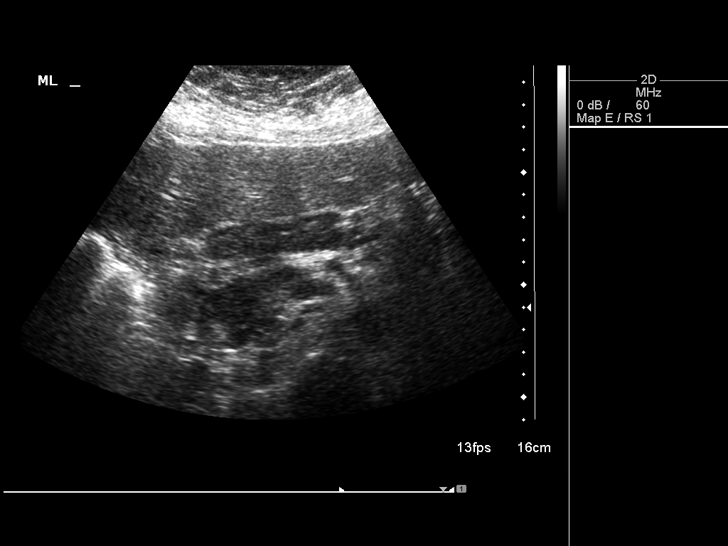
[im 15/85]
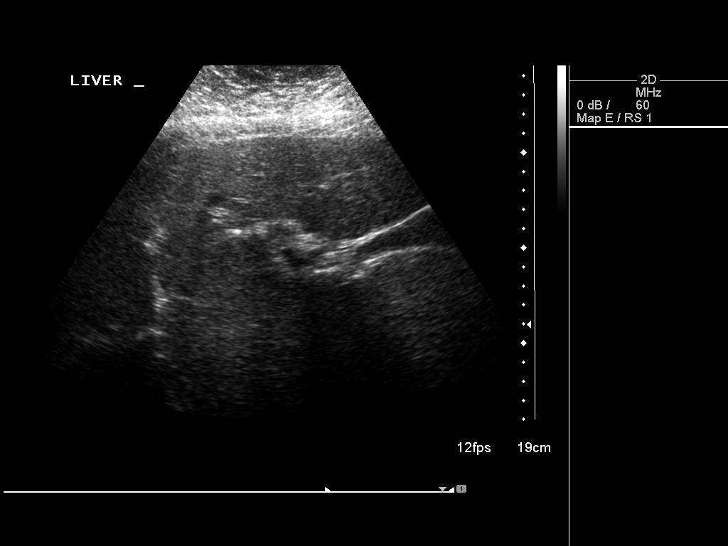
[im 22/85]
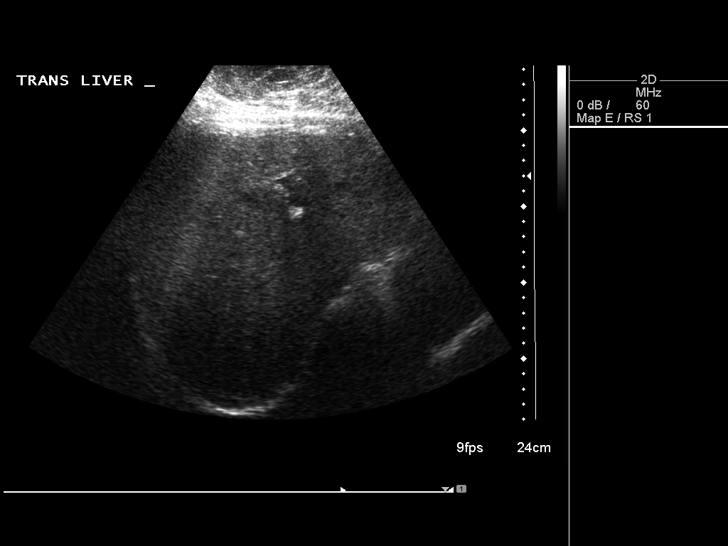
[im 29/85]
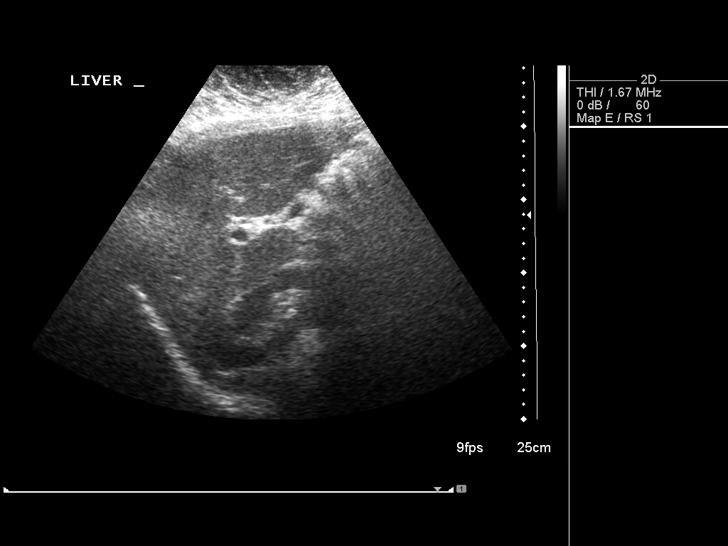
[im 36/85]
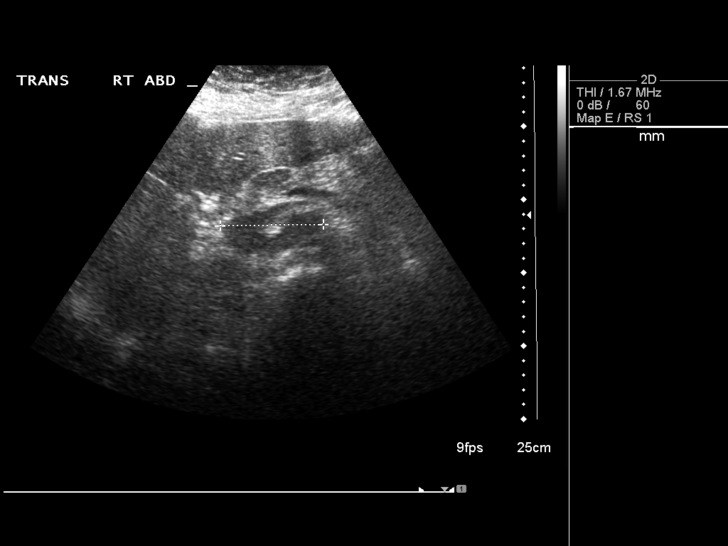
[im 43/85]
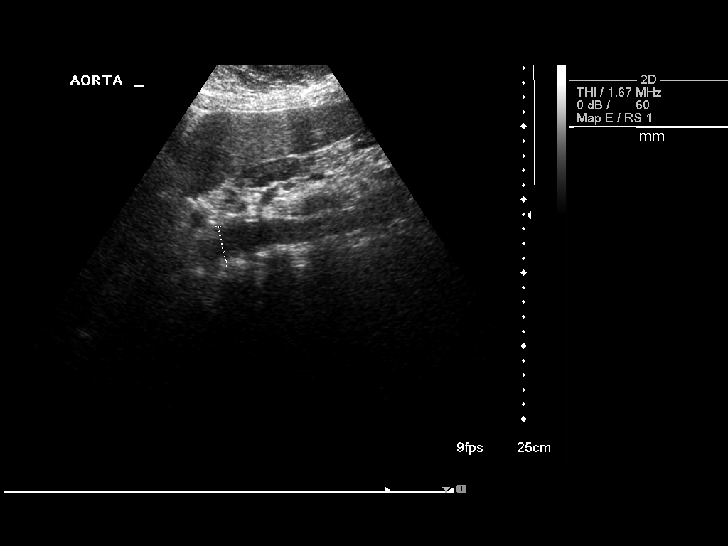
[im 50/85]
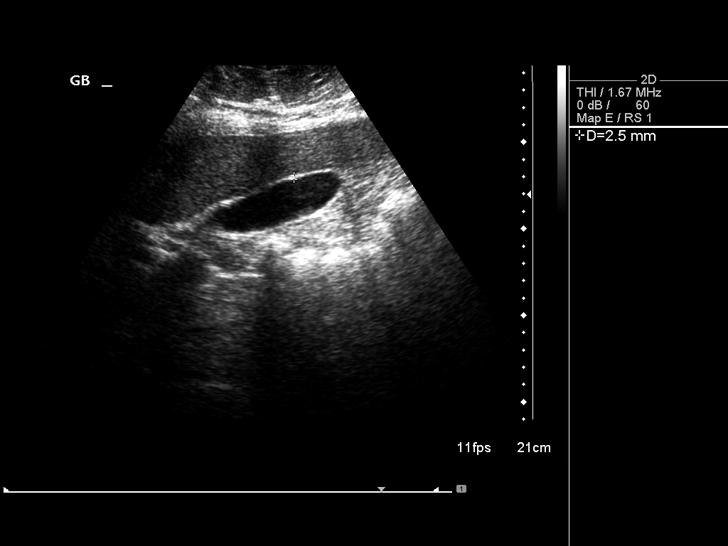
[im 57/85]
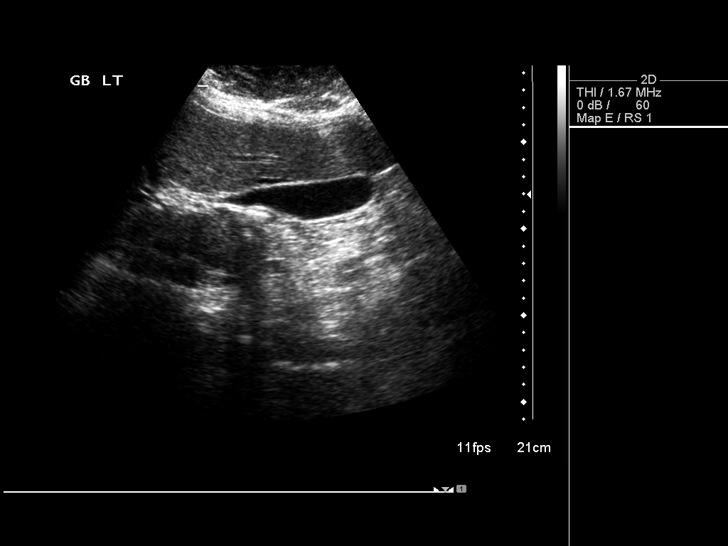
[im 64/85]
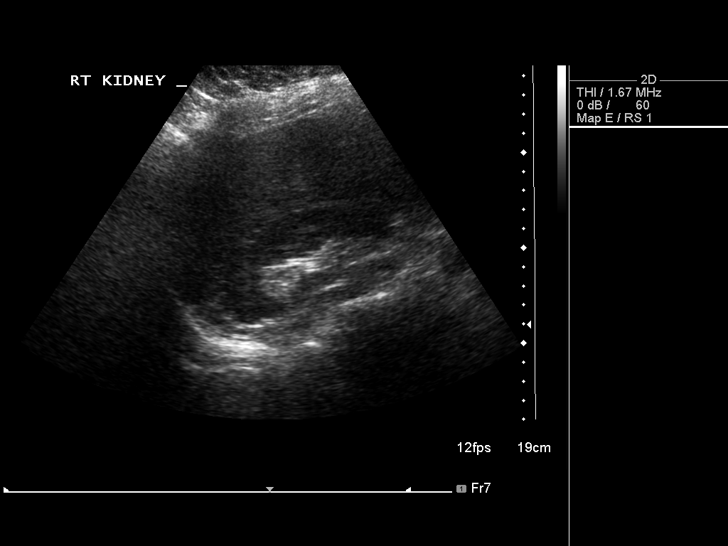
[im 71/85]
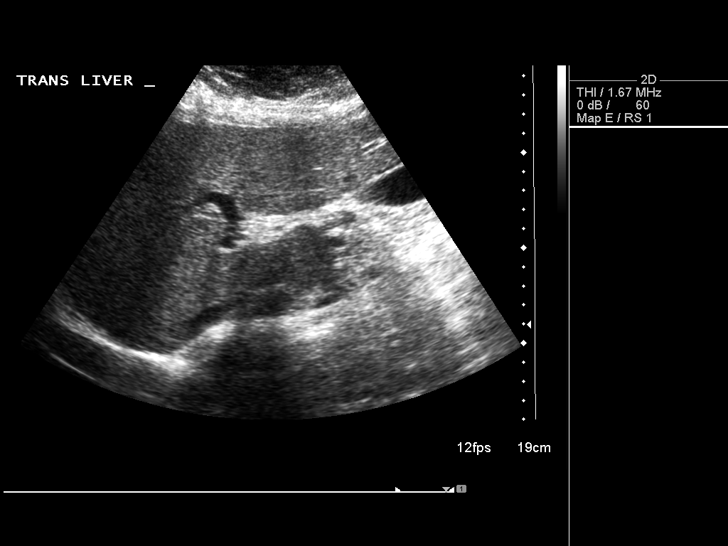
[im 78/85]
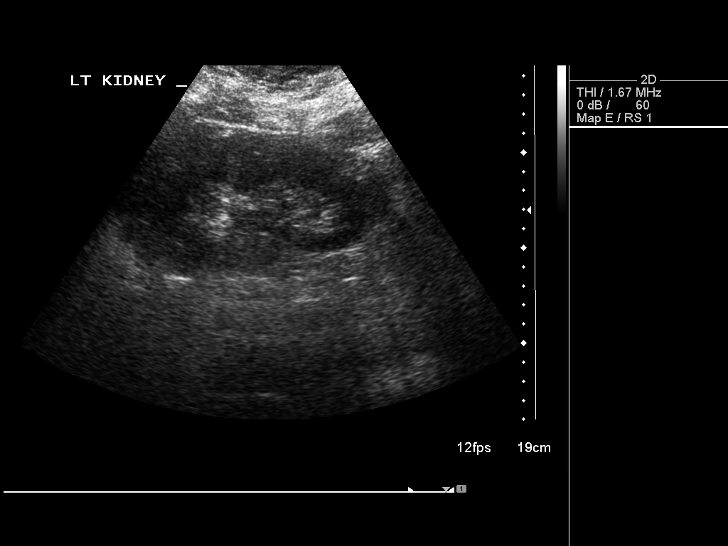
[im 85/85]
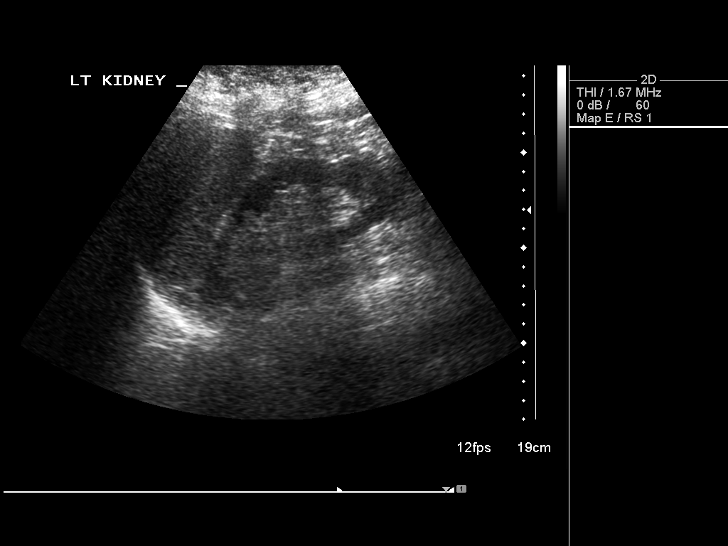

[13 of 25 positions shown; findings below may reference images not displayed]

FINDINGS: Gallbladder: No gallstones or wall thickening visualized. No
sonographic Murphy sign noted by sonographer.

Common bile duct: Diameter: 5 mm

Liver: No focal lesion identified. There is mild increased
echogenicity of the liver suspicious for fatty infiltration. No
intrahepatic biliary ductal dilatation.

IVC: No abnormality visualized.

Pancreas: Visualized portion unremarkable.

Spleen: Size and appearance within normal limits. Measures 9.7 cm in
length

Right Kidney: Length: 10.8 cm. Echogenicity within normal limits. No
mass or hydronephrosis visualized.

Left Kidney: Length: 11.7 cm. Echogenicity within normal limits. No
mass or hydronephrosis visualized.

Abdominal aorta: No aneurysm visualized. Measures up to 2.6 cm in
diameter.

Other findings: Suboptimal study due to patient's large body
habitus. There are probable enlarged lymph nodes in right abdomen
adjacent to midline measures 6.8 x 1.6 x 4.2 cm. A second measures
8.2 x 3.2 x 7.1 cm. Pathologic adenopathy cannot be excluded.
Further correlation with CT scan could be performed as clinically
warranted.
IMPRESSION: 1. No gallstones are noted within gallbladder.  Normal CBD.
2. Mild increased echogenicity of the liver suspicious for fatty
infiltration.
3. Suboptimal study due to patient's large body habitus. Question
enlarged lymph nodes in right abdomen adjacent to midline.
Pathologic adenopathy cannot be excluded. Further correlation with
CT scan could be performed as clinically warranted.
4. No hydronephrosis or hydroureter.

## 2018-09-20 ENCOUNTER — Other Ambulatory Visit: Payer: Self-pay | Admitting: Medical

## 2018-09-21 ENCOUNTER — Other Ambulatory Visit: Payer: Self-pay | Admitting: Endocrinology

## 2018-09-23 ENCOUNTER — Encounter: Payer: Self-pay | Admitting: Endocrinology

## 2018-09-23 ENCOUNTER — Other Ambulatory Visit: Payer: Self-pay

## 2018-09-23 ENCOUNTER — Ambulatory Visit (INDEPENDENT_AMBULATORY_CARE_PROVIDER_SITE_OTHER): Payer: BC Managed Care – PPO | Admitting: Endocrinology

## 2018-09-23 VITALS — BP 134/86 | HR 82 | Ht 67.0 in | Wt 310.6 lb

## 2018-09-23 DIAGNOSIS — Z794 Long term (current) use of insulin: Secondary | ICD-10-CM | POA: Diagnosis not present

## 2018-09-23 DIAGNOSIS — E1165 Type 2 diabetes mellitus with hyperglycemia: Secondary | ICD-10-CM

## 2018-09-23 LAB — POCT GLYCOSYLATED HEMOGLOBIN (HGB A1C): Hemoglobin A1C: 7.5 % — AB (ref 4.0–5.6)

## 2018-09-23 NOTE — Patient Instructions (Addendum)
We should recheck the thyroid ultrasound next year.   Please continue the same medications for diabetes.   Please continue to consider the weight loss surgery.  It is the best treatment for your diabetes.   check your blood sugar twice a day.  vary the time of day when you check, between before the 3 meals, and at bedtime.  also check if you have symptoms of your blood sugar being too high or too low.  please keep a record of the readings and bring it to your next appointment here (or you can bring the meter itself).  You can write it on any piece of paper.  please call us sooner if your blood sugar goes below 70, or if you have a lot of readings over 200. Please come back for a follow-up appointment in 3 months.

## 2018-09-23 NOTE — Progress Notes (Signed)
Subjective:    Patient ID: Amy Rosario, female    DOB: 1960/09/30, 58 y.o.   MRN: [id number]  HPI Pt returns for f/u of diabetes mellitus: DM type: Insulin-requiring type 2.   Dx'ed: 2015.  Complications: none Therapy: insulin since 2017, ozempic, and metformin.  GDM: never DKA: never Severe hypoglycemia: never Pancreatitis: never.  Pancreatic imaging: normal on 2018 [place].  Other: she takes QD insulin, after poor results with multiple daily injections; she takes intermitt prednisone for sarcoidosis.   Interval history: no recent steroids.  no cbg record, but states cbg's vary from 102-200's.  It is in general higher as the day goes on.  pt states she feels well in general.   Pt also has MNG (dx'ed 2015; bx in 2020 showed Beth Cat 3, and affirma reported insuff material; f/u US in 2020 was unchanged).  Past Medical History:  Diagnosis Date  . Back pain   . Diabetes mellitus without complication ([place])   . Goiter    left side, saw dr tysinger april 2015 for, had biopsy done was told it is cyst  . H/O echocardiogram    Dr. Charolette [facility], [facility]  . Hyperlipidemia   . Hypertension 2000  . Impaired fasting blood sugar 2014  . Obesity   . Other screening mammogram    planned for 05/2013  . Routine gynecological examination    last pap 2012  . Sarcoid   . Sleep apnea    on CPAP setting of 2    Past Surgical History:  Procedure Laterality Date  . COLONOSCOPY WITH PROPOFOL N/A 09/21/2013   Procedure: COLONOSCOPY WITH PROPOFOL;  Surgeon: Juanita Craver, MD;  Location: [facility];  Service: Endoscopy;  Laterality: N/A;  . CYST EXCISION     right low back  . TUBAL LIGATION    . VIDEO BRONCHOSCOPY Bilateral 07/05/2016   Procedure: VIDEO BRONCHOSCOPY WITH FLUORO;  Surgeon: Tanda Rockers, MD;  Location: [facility];  Service: Endoscopy;  Laterality: Bilateral;    Social History   Socioeconomic History  . Marital status: Single    Spouse name: Not on  file  . Number of children: Not on file  . Years of education: Not on file  . Highest education level: Not on file  Occupational History  . Not on file  Social Needs  . Financial resource strain: Not on file  . Food insecurity    Worry: Not on file    Inability: Not on file  . Transportation needs    Medical: Not on file    Non-medical: Not on file  Tobacco Use  . Smoking status: Never Smoker  . Smokeless tobacco: Never Used  Substance and Sexual Activity  . Alcohol use: No  . Drug use: No  . Sexual activity: Not Currently    Partners: Male    Birth control/protection: Post-menopausal, None  Lifestyle  . Physical activity    Days per week: Not on file    Minutes per session: Not on file  . Stress: Not on file  Relationships  . Social Herbalist on phone: Not on file    Gets together: Not on file    Attends religious service: Not on file    Active member of club or organization: Not on file    Attends meetings of clubs or organizations: Not on file    Relationship status: Not on file  . Intimate partner violence    Fear of current or  ex partner: Not on file    Emotionally abused: Not on file    Physically abused: Not on file    Forced sexual activity: Not on file  Other Topics Concern  . 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 allopurinol (ZYLOPRIM) 100 MG tablet Take 1 tablet (100 mg total) by mouth daily. 90 tablet 3  . amLODIPine-Valsartan-HCTZ (EXFORGE HCT) 10-320-25 MG TABS Take 1 tablet by mouth daily. 90 tablet 3  . aspirin 81 MG tablet Take 1 tablet (81 mg total) by mouth daily. 90 tablet 3  . bisoprolol (ZEBETA) 10 MG tablet Take 1 tablet (10 mg total) by mouth daily. 90 tablet 3  . Blood Glucose Monitoring Suppl  (ONE TOUCH ULTRA 2) w/Device KIT 1 Device by Does not apply route 2 (two) times daily. 1 each 0  . cholecalciferol (VITAMIN D3) 25 MCG (1000 UT) tablet Take 1 tablet (1,000 Units total) by mouth daily. 90 tablet 3  . cloNIDine (CATAPRES) 0.2 MG tablet Take 1 tablet (0.2 mg total) by mouth 3 (three) times daily. 90 tablet 11  . famotidine (PEPCID) 20 MG tablet TAKE 1 TABLET BY MOUTH EVERYDAY AT BEDTIME 90 tablet 3  . gabapentin (NEURONTIN) 100 MG capsule Take 1 capsule (100 mg total) by mouth at bedtime. 90 capsule 3  . Insulin Degludec (TRESIBA FLEXTOUCH) 200 UNIT/ML SOPN Inject 130 Units into the skin daily. 18 pen 2  . Insulin Pen Needle (BD PEN NEEDLE NANO U/F) 32G X 4 MM MISC USE 4X A DAY 200 each 6  . Lancets (ONETOUCH DELICA PLUS [id number]) MISC 100 PENS BY DOES NOT APPLY ROUTE 2 (TWO) TIMES DAILY. 100 each 3  . metFORMIN (GLUCOPHAGE) 1000 MG tablet Take 1 tablet (1,000 mg total) by mouth 2 (two) times daily with a meal. 180 tablet 3  . ONETOUCH VERIO test strip CHECKS SUGARS 4 TIMES DAILY, ON MEAL TIME INSULIN 100 strip 6  . OZEMPIC, 1 MG/DOSE, 2 MG/1.5ML SOPN INJECT 1 MG INTO THE SKIN ONCE A WEEK. 1 pen 11  . pantoprazole (PROTONIX) 40 MG tablet Take 1 tablet (40 mg total) by mouth daily. Take 30-60 min before first meal of the day 30 tablet 2  . potassium chloride (KLOR-CON 10) 10 MEQ tablet Take 1 tablet (10 mEq total) by mouth 2 (two) times daily. 180 tablet 3  . rosuvastatin (CRESTOR) 20 MG tablet Take 1 tablet (20 mg total) by mouth daily. 90 tablet 3   No current facility-administered medications on file prior to visit.     Allergies  Allergen Reactions  . Penicillins Hives and Swelling    Has patient had a PCN reaction causing immediate rash, facial/tongue/throat swelling, SOB or lightheadedness with hypotension: Yes Has patient had a PCN reaction causing severe rash involving mucus membranes or skin necrosis: No Has patient had a PCN reaction that required hospitalization: No  Has patient had a PCN reaction occurring within the last 10 years: No If all of the above answers are "NO", then may proceed with Cephalosporin use.    Family History  Problem Relation Age of Onset  . Heart disease Father   . Hypertension Father   . Heart disease Brother   . Cancer Neg Hx   . Stroke Neg  Hx   . Diabetes Neg Hx     BP 134/86 (BP Location: Left Wrist, Patient Position: Sitting, Cuff Size: Large)   Pulse 82   Ht 5' 7"  (1.702 m)   Wt (!) 310 lb 9.6 oz (140.9 kg)   LMP 11/09/2013   SpO2 96%   BMI 48.65 kg/m    Review of Systems She denies hypoglycemia    Objective:   Physical Exam VITAL SIGNS:  See vs page GENERAL: no distress Pulses: dorsalis pedis intact bilat.   MSK: no deformity of the feet CV: trace bilat leg edema Skin:  no ulcer on the feet.  normal color and temp on the feet. Neuro: sensation is intact to touch on the feet.   Lab Results  Component Value Date   HGBA1C 7.5 (A) 09/23/2018   Lab Results  Component Value Date   CREATININE 0.97 04/02/2018   BUN 14 04/02/2018   NA 140 04/02/2018   K 3.1 (L) 04/02/2018   CL 98 04/02/2018   CO2 31 04/02/2018       Assessment & Plan:  Insulin-requiring type 2 DM: this is the best control this pt should aim for, given this regimen, which does match insulin to her changing needs throughout the day. Obesity: persistent MNG: she is due for recheck next year. Edema: This limits rx options  Patient Instructions  We should recheck the thyroid ultrasound next year.   Please continue the same medications for diabetes.   Please continue to consider the weight loss surgery.  It is the best treatment for your diabetes.   check your blood sugar twice a day.  vary the time of day when you check, between before the 3 meals, and at bedtime.  also check if you have symptoms of your blood sugar being too high or too low.  please keep a record of the readings and bring it to your next appointment here (or you can  bring the meter itself).  You can write it on any piece of paper.  please call us sooner if your blood sugar goes below 70, or if you have a lot of readings over 200. Please come back for a follow-up appointment in 3 months.

## 2018-11-24 ENCOUNTER — Ambulatory Visit: Payer: BC Managed Care – PPO | Admitting: Endocrinology

## 2018-12-25 IMAGING — DX DG CHEST 2V
2 series · 2 of 2 positions shown · non-contrast
Comparison: Radiographs March 26, 2016.

CLINICAL DATA: Sarcoidosis.

EXAM:
CHEST  2 VIEW

[chest pa]
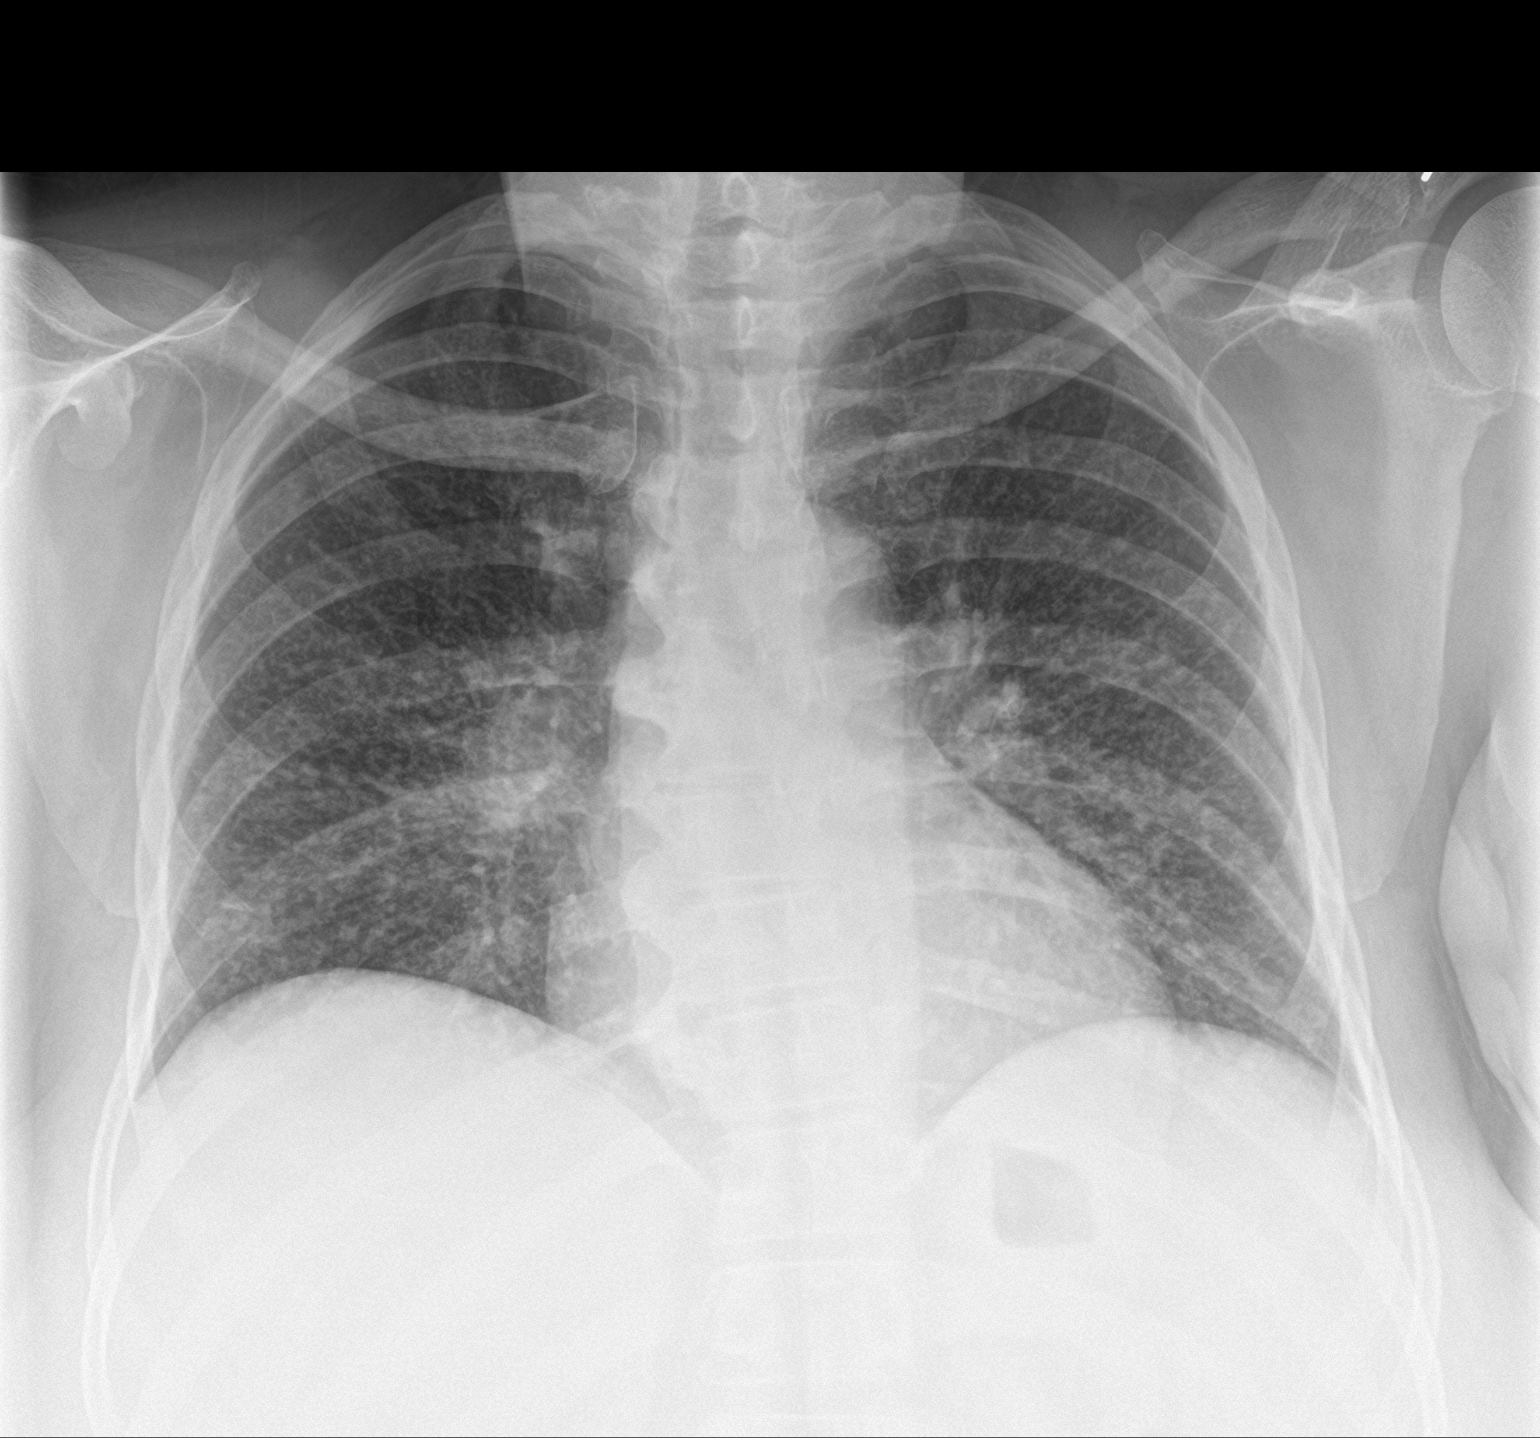

[chest lat]
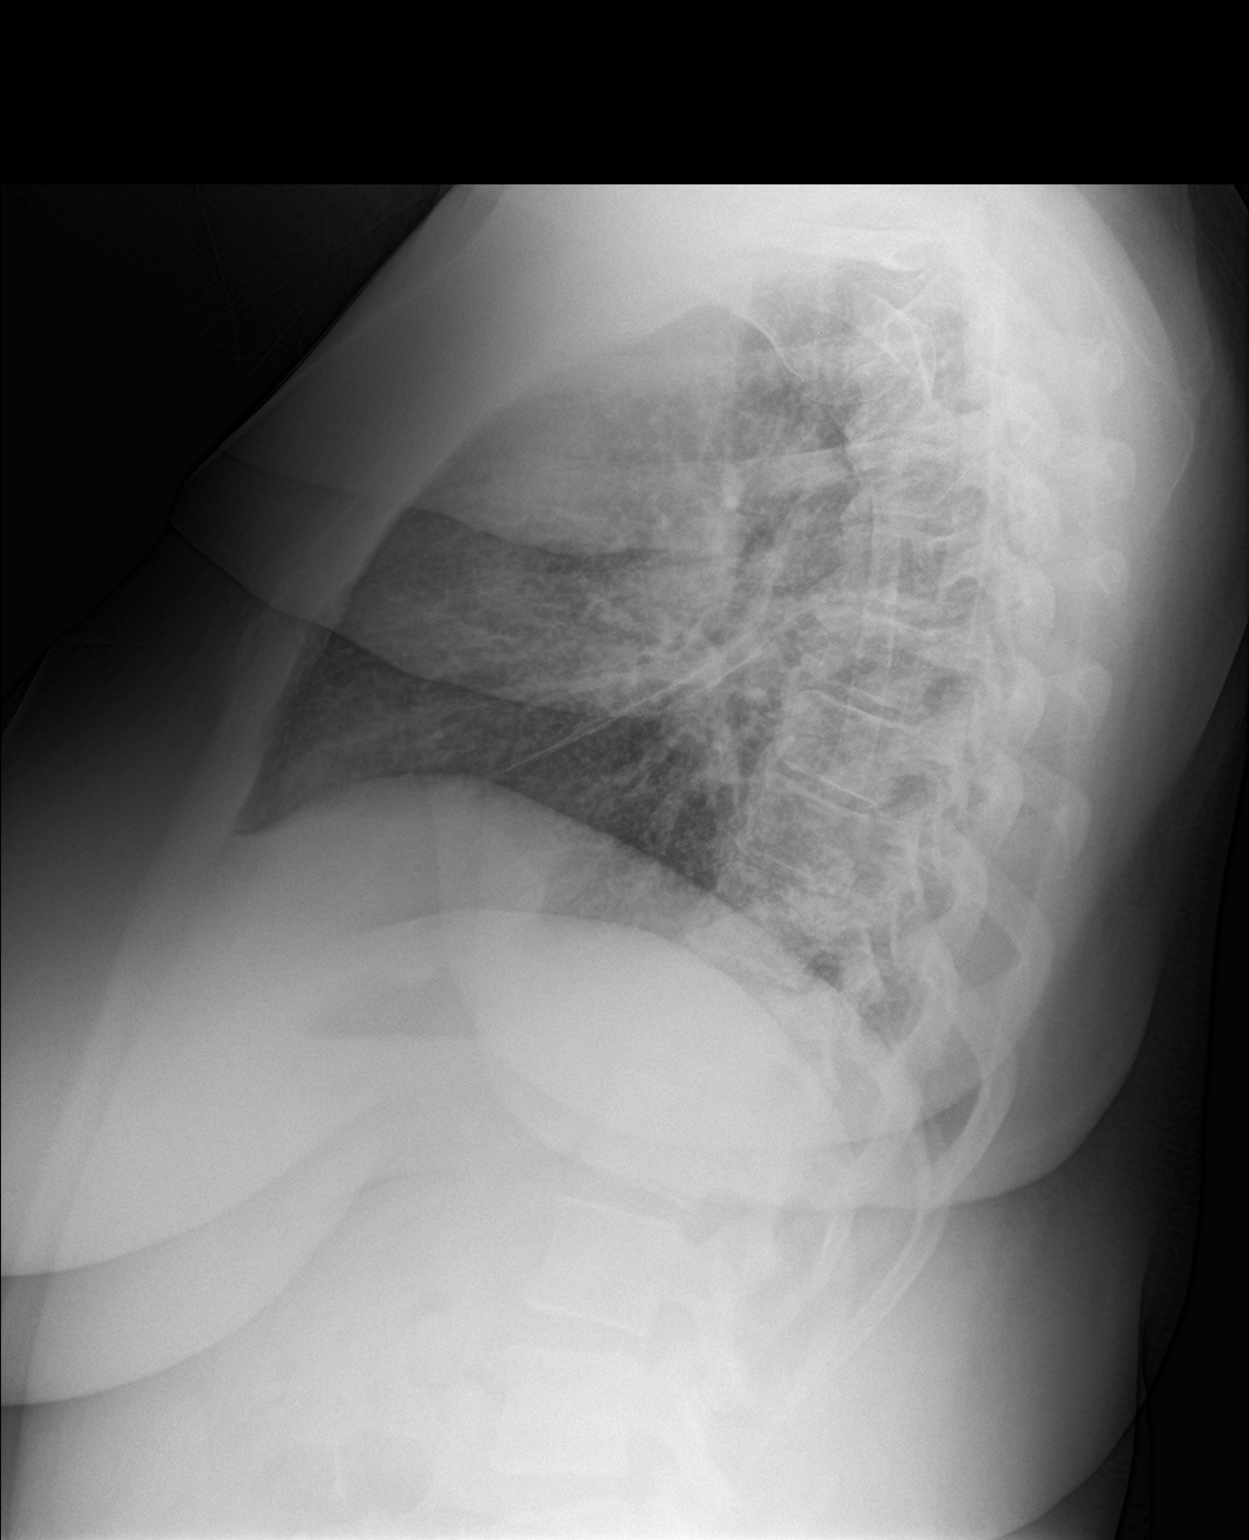

[2 of 2 positions shown; findings below may reference images not displayed]

FINDINGS: The heart size and mediastinal contours are within normal limits. No
pneumothorax or pleural effusion is noted. Stable interstitial
densities are noted throughout both lungs which may be related to
history of sarcoidosis. The visualized skeletal structures are
unremarkable.
IMPRESSION: Stable bilateral diffuse interstitial densities are noted most
likely related to sarcoidosis. No significant change compared to
prior exam.

## 2019-01-12 ENCOUNTER — Other Ambulatory Visit: Payer: Self-pay

## 2019-01-12 ENCOUNTER — Other Ambulatory Visit (INDEPENDENT_AMBULATORY_CARE_PROVIDER_SITE_OTHER): Payer: BC Managed Care – PPO

## 2019-01-12 DIAGNOSIS — Z23 Encounter for immunization: Secondary | ICD-10-CM | POA: Diagnosis not present

## 2019-02-06 HISTORY — PX: ENDOMETRIAL BIOPSY: SHX622

## 2019-02-09 ENCOUNTER — Ambulatory Visit (INDEPENDENT_AMBULATORY_CARE_PROVIDER_SITE_OTHER): Payer: BC Managed Care – PPO | Admitting: Endocrinology

## 2019-02-09 ENCOUNTER — Encounter: Payer: Self-pay | Admitting: Endocrinology

## 2019-02-09 ENCOUNTER — Other Ambulatory Visit: Payer: Self-pay

## 2019-02-09 VITALS — BP 126/80 | HR 99 | Ht 67.0 in | Wt 323.6 lb

## 2019-02-09 DIAGNOSIS — E1165 Type 2 diabetes mellitus with hyperglycemia: Secondary | ICD-10-CM

## 2019-02-09 DIAGNOSIS — Z794 Long term (current) use of insulin: Secondary | ICD-10-CM

## 2019-02-09 LAB — POCT GLYCOSYLATED HEMOGLOBIN (HGB A1C): Hemoglobin A1C: 7.7 % — AB (ref 4.0–5.6)

## 2019-02-09 MED ORDER — TRESIBA FLEXTOUCH 200 UNIT/ML ~~LOC~~ SOPN: 120.0000 [IU] | PEN_INJECTOR | Freq: Every day | SUBCUTANEOUS | 2 refills | Status: DC

## 2019-02-09 MED ORDER — FARXIGA 5 MG PO TABS: 5.0000 mg | ORAL_TABLET | Freq: Every day | ORAL | 11 refills | Status: DC

## 2019-02-09 NOTE — Progress Notes (Signed)
Subjective:    Patient ID: Amy Rosario, female    DOB: [birth year], 59 y.o.   MRN: [id number]  HPI Pt returns for f/u of diabetes mellitus: DM type: Insulin-requiring type 2.   Dx'ed: 2015.  Complications: none Therapy: insulin since 2017, ozempic, and metformin.  GDM: never DKA: never Severe hypoglycemia: never Pancreatitis: never.  Pancreatic imaging: normal on 2018 [place].  Other: she takes QD insulin, after poor results with multiple daily injections; she takes intermitt prednisone for sarcoidosis.   Interval history: no recent steroids.  no cbg record, but states cbg's vary from 70-200.  It is in general higher as the day goes on.  pt states she feels well in general.   Pt also has MNG (dx'ed 2015; bx in 2020 showed Beth Cat 3, and affirma reported insuff material; f/u US in 2020 was unchanged).   Past Medical History:  Diagnosis Date  . Back pain   . Diabetes mellitus without complication ([place])   . Goiter    left side, saw dr tysinger april 2015 for, had biopsy done was told it is cyst  . H/O echocardiogram    Dr. Charolette [facility], [facility]  . Hyperlipidemia   . Hypertension 2000  . Impaired fasting blood sugar 2014  . Obesity   . Other screening mammogram    planned for 05/2013  . Routine gynecological examination    last pap 2012  . Sarcoid   . Sleep apnea    on CPAP setting of 2    Past Surgical History:  Procedure Laterality Date  . COLONOSCOPY WITH PROPOFOL N/A 09/21/2013   Procedure: COLONOSCOPY WITH PROPOFOL;  Surgeon: Juanita Craver, MD;  Location: [facility];  Service: Endoscopy;  Laterality: N/A;  . CYST EXCISION     right low back  . TUBAL LIGATION    . VIDEO BRONCHOSCOPY Bilateral 07/05/2016   Procedure: VIDEO BRONCHOSCOPY WITH FLUORO;  Surgeon: Tanda Rockers, MD;  Location: [facility];  Service: Endoscopy;  Laterality: Bilateral;    Social History   Socioeconomic History  . Marital status: Single    Spouse name: Not on file   . Number of children: Not on file  . Years of education: Not on file  . Highest education level: Not on file  Occupational History  . Not on file  Tobacco Use  . Smoking status: Never Smoker  . Smokeless tobacco: Never Used  Substance and Sexual Activity  . Alcohol use: No  . Drug use: No  . Sexual activity: Not Currently    Partners: Male    Birth control/protection: Post-menopausal, None  Other Topics Concern  . 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 Difficulty of Paying Living Expenses: Not on file  Food Insecurity:   . Worried About Charity fundraiser in the Last Year: Not on file  . Ran Out of Food in the Last Year: Not on file  Transportation Needs:   . Lack of Transportation (Medical): Not on file  . Lack of Transportation (Non-Medical): Not on file  Physical Activity:   . Days of Exercise per Week: Not on file  . Minutes of Exercise per Session: Not on file  Stress:   . Feeling of Stress : Not on file  Social  Connections:   . Frequency of Communication with Friends and Family: Not on file  . Frequency of Social Gatherings with Friends and Family: Not on file  . Attends Religious Services: Not on file  . Active Member of Clubs or Organizations: Not on file  . Attends Archivist Meetings: Not on file  . Marital Status: Not on file  Intimate Partner Violence:   . Fear of Current or Ex-Partner: Not on file  . Emotionally Abused: Not on file  . Physically Abused: Not on file  . Sexually Abused: Not on file    Current Outpatient Medications on File Prior to Visit  Medication Sig Dispense Refill  . allopurinol (ZYLOPRIM) 100 MG tablet Take 1 tablet (100 mg total) by mouth daily. 90 tablet 3  . amLODIPine-Valsartan-HCTZ (EXFORGE HCT) 10-320-25 MG  TABS Take 1 tablet by mouth daily. 90 tablet 3  . aspirin 81 MG tablet Take 1 tablet (81 mg total) by mouth daily. 90 tablet 3  . bisoprolol (ZEBETA) 10 MG tablet Take 1 tablet (10 mg total) by mouth daily. 90 tablet 3  . Blood Glucose Monitoring Suppl (ONE TOUCH ULTRA 2) w/Device KIT 1 Device by Does not apply route 2 (two) times daily. 1 each 0  . cholecalciferol (VITAMIN D3) 25 MCG (1000 UT) tablet Take 1 tablet (1,000 Units total) by mouth daily. 90 tablet 3  . cloNIDine (CATAPRES) 0.2 MG tablet Take 1 tablet (0.2 mg total) by mouth 3 (three) times daily. 90 tablet 11  . famotidine (PEPCID) 20 MG tablet TAKE 1 TABLET BY MOUTH EVERYDAY AT BEDTIME 90 tablet 3  . gabapentin (NEURONTIN) 100 MG capsule Take 1 capsule (100 mg total) by mouth at bedtime. 90 capsule 3  . Insulin Pen Needle (BD PEN NEEDLE NANO U/F) 32G X 4 MM MISC USE 4X A DAY 200 each 6  . Lancets (ONETOUCH DELICA PLUS [id number]) MISC 100 PENS BY DOES NOT APPLY ROUTE 2 (TWO) TIMES DAILY. 100 each 3  . metFORMIN (GLUCOPHAGE) 1000 MG tablet Take 1 tablet (1,000 mg total) by mouth 2 (two) times daily with a meal. 180 tablet 3  . ONETOUCH VERIO test strip CHECKS SUGARS 4 TIMES DAILY, ON MEAL TIME INSULIN 100 strip 6  . OZEMPIC, 1 MG/DOSE, 2 MG/1.5ML SOPN INJECT 1 MG INTO THE SKIN ONCE A WEEK. 1 pen 11  . pantoprazole (PROTONIX) 40 MG tablet Take 1 tablet (40 mg total) by mouth daily. Take 30-60 min before first meal of the day 30 tablet 2  . potassium chloride (KLOR-CON 10) 10 MEQ tablet Take 1 tablet (10 mEq total) by mouth 2 (two) times daily. 180 tablet 3  . rosuvastatin (CRESTOR) 20 MG tablet Take 1 tablet (20 mg total) by mouth daily. 90 tablet 3   No current facility-administered medications on file prior to visit.    Allergies  Allergen Reactions  . Penicillins Hives and Swelling    Has patient had a PCN reaction causing immediate rash, facial/tongue/throat swelling, SOB or lightheadedness with hypotension: Yes Has patient had  a PCN reaction causing severe rash involving mucus membranes or skin necrosis: No Has patient had a PCN reaction that required hospitalization: No Has patient had a PCN reaction occurring within the last 10 years: No If all of the above answers are "NO", then may proceed with Cephalosporin use.    Family History  Problem Relation Age of Onset  . Heart disease Father   . Hypertension Father   . Heart disease Brother   .  Cancer Neg Hx   . Stroke Neg Hx   . Diabetes Neg Hx     BP 126/80 (BP Location: Right Wrist, Patient Position: Sitting, Cuff Size: Large)   Pulse 99   Ht _0  (1.702 m)   Wt (!) 323 lb 9.6 oz (146.8 kg)   LMP 11/09/2013   SpO2 98%   BMI 50.68 kg/m    Review of Systems Denies LOC    Objective:   Physical Exam VITAL SIGNS:  See vs page GENERAL: no distress Pulses: dorsalis pedis intact bilat.   MSK: no deformity of the feet CV: trace bilat leg edema Skin:  no ulcer on the feet.  normal color and temp on the feet. Neuro: sensation is intact to touch on the feet  Lab Results  Component Value Date   CREATININE 0.97 04/02/2018   BUN 14 04/02/2018   NA 140 04/02/2018   K 3.1 (L) 04/02/2018   CL 98 04/02/2018   CO2 31 04/02/2018     Lab Results  Component Value Date   HGBA1C 7.7 (A) 02/09/2019       Assessment & Plan:  Insulin-requiring type 2 DM: this is the best control this pt should aim for, given this regimen, which does match insulin to her changing needs throughout the day. Edema: persistent Hypoglycemia: this also limits aggressiveness of glycemic control.  Replacing some insulin with farxiga might help  Patient Instructions  I have sent a prescription to your pharmacy, to add "[place]ade," and: Please redcee the Tresiba to 120 units daily, and: Please continue the same other diabetes medications. check your blood sugar twice a day.  vary the time of day when you check, between before the 3 meals, and at bedtime.  also check if you have  symptoms of your blood sugar being too high or too low.  please keep a record of the readings and bring it to your next appointment here (or you can bring the meter itself).  You can write it on any piece of paper.  please call us sooner if your blood sugar goes below 70, or if you have a lot of readings over 200. Please come back for a follow-up appointment in 2-3 months.

## 2019-02-09 NOTE — Patient Instructions (Addendum)
I have sent a prescription to your pharmacy, to add "Wilder Glade," and: Please redcee the Tresiba to 120 units daily, and: Please continue the same other diabetes medications. check your blood sugar twice a day.  vary the time of day when you check, between before the 3 meals, and at bedtime.  also check if you have symptoms of your blood sugar being too high or too low.  please keep a record of the readings and bring it to your next appointment here (or you can bring the meter itself).  You can write it on any piece of paper.  please call us sooner if your blood sugar goes below 70, or if you have a lot of readings over 200. Please come back for a follow-up appointment in 2-3 months.

## 2019-02-11 ENCOUNTER — Other Ambulatory Visit: Payer: Self-pay | Admitting: Medical

## 2019-02-11 ENCOUNTER — Other Ambulatory Visit: Payer: Self-pay | Admitting: Endocrinology

## 2019-02-11 DIAGNOSIS — M79672 Pain in left foot: Secondary | ICD-10-CM

## 2019-02-11 DIAGNOSIS — M79671 Pain in right foot: Secondary | ICD-10-CM

## 2019-02-11 DIAGNOSIS — M792 Neuralgia and neuritis, unspecified: Secondary | ICD-10-CM

## 2019-02-11 DIAGNOSIS — E119 Type 2 diabetes mellitus without complications: Secondary | ICD-10-CM

## 2019-04-19 ENCOUNTER — Other Ambulatory Visit: Payer: Self-pay | Admitting: Medical

## 2019-04-20 IMAGING — DX DG CHEST 2V
2 series · 2 of 2 positions shown · non-contrast
Comparison: 07/05/2016

CLINICAL DATA: Sarcoidosis.

EXAM:
CHEST  2 VIEW

[chest pa]
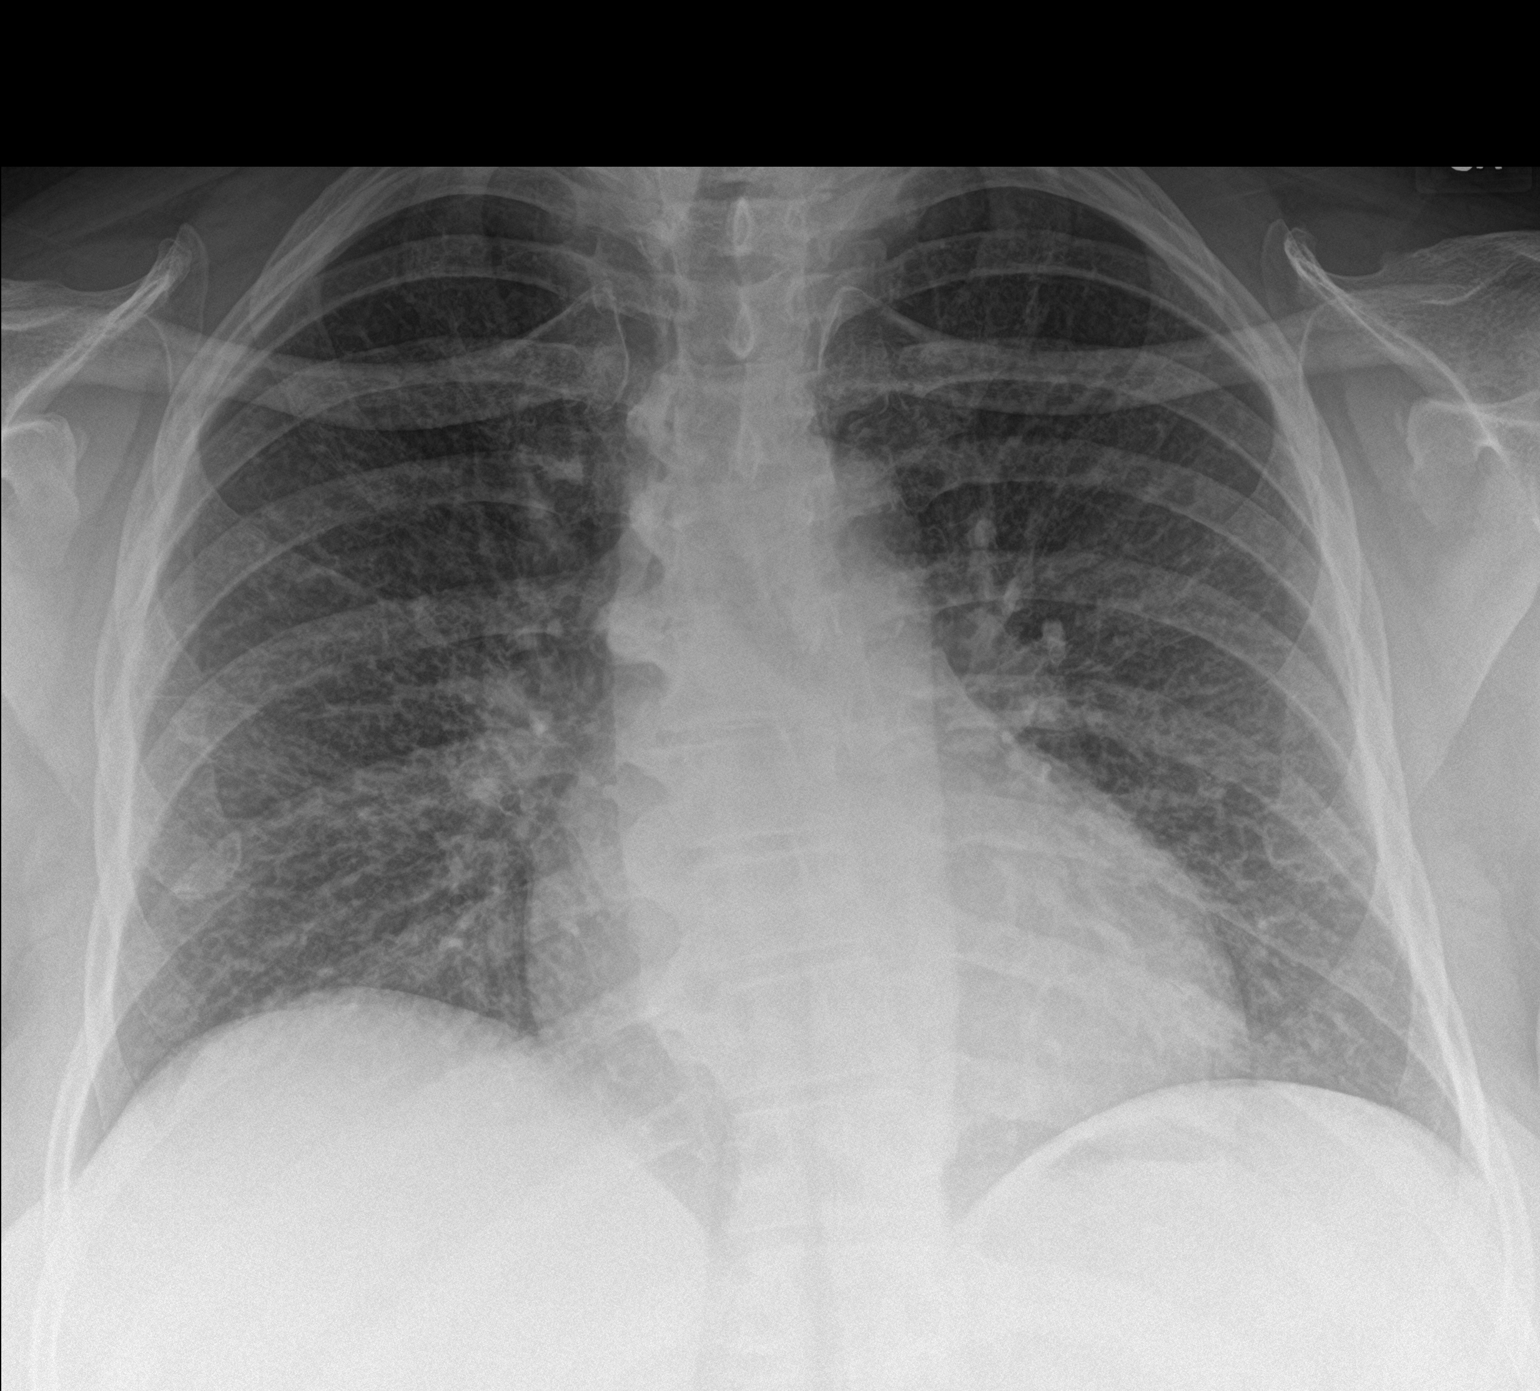

[chest lat]
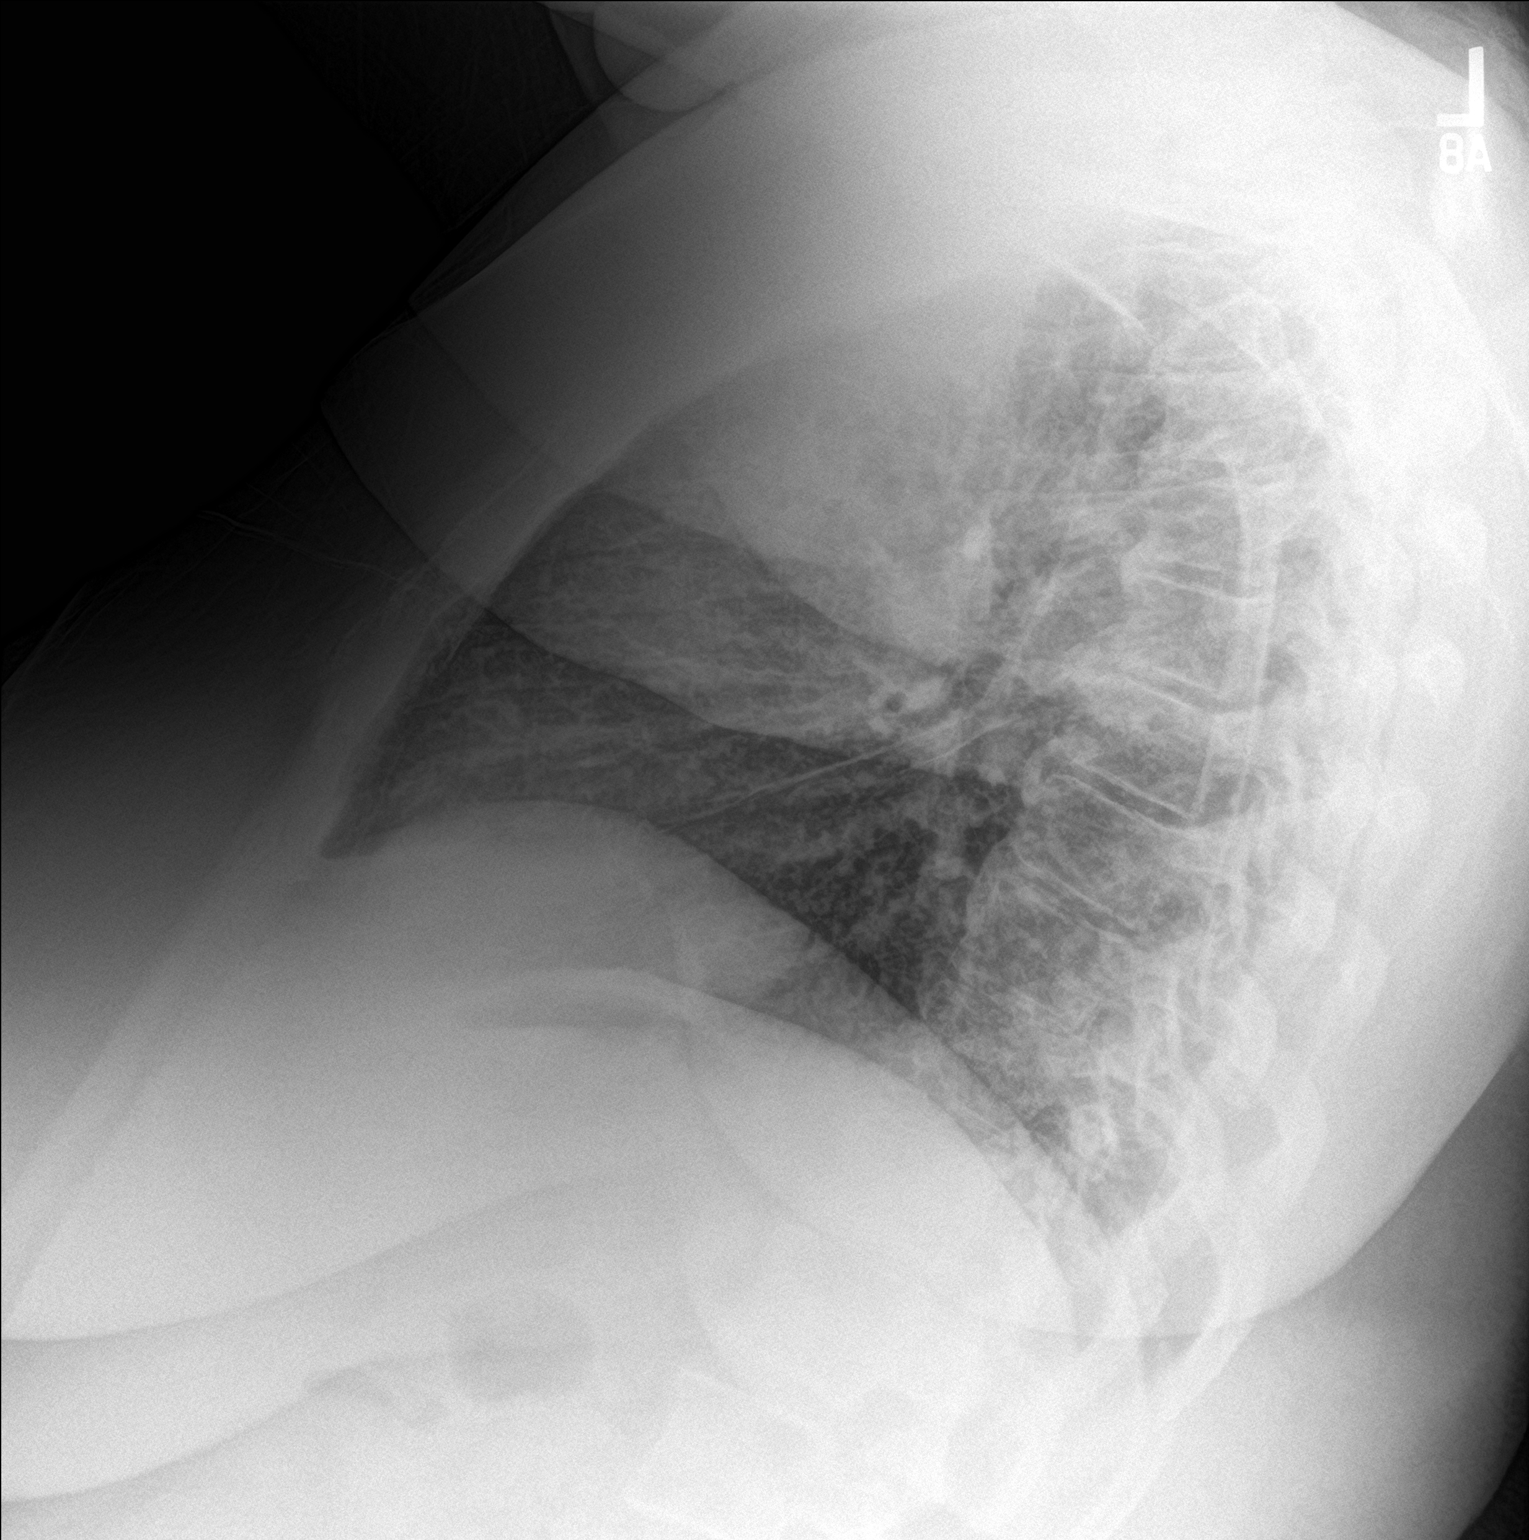

[2 of 2 positions shown; findings below may reference images not displayed]

FINDINGS: No focal airspace consolidation or pleural effusion. Diffuse
interstitial prominence persists with micro nodularity in the lungs
bilaterally again noted Cardiopericardial silhouette is at upper
limits of normal for size. The visualized bony structures of the
thorax are intact.
IMPRESSION: Improved aeration on the current study with interstitial prominence
and diffuse micro nodularity bilaterally. No substantial change in
exam.

## 2019-04-20 NOTE — Telephone Encounter (Signed)
Is this appropriate as patient sees Endo?

## 2019-04-20 NOTE — Telephone Encounter (Signed)
No, this should be through endocrinology  Let her or pharmacy know to send request to endo

## 2019-04-24 ENCOUNTER — Other Ambulatory Visit: Payer: Self-pay | Admitting: Medical

## 2019-04-29 ENCOUNTER — Other Ambulatory Visit (HOSPITAL_COMMUNITY)
Admission: RE | Admit: 2019-04-29 | Discharge: 2019-04-29 | Disposition: A | Discharge status: Home / Self Care | Payer: BC Managed Care – PPO | Source: Ambulatory Visit | Attending: Obstetrics and Gynecology | Admitting: Obstetrics and Gynecology

## 2019-04-29 ENCOUNTER — Ambulatory Visit: Payer: BC Managed Care – PPO | Admitting: Obstetrics and Gynecology

## 2019-04-29 ENCOUNTER — Encounter: Payer: Self-pay | Admitting: Obstetrics and Gynecology

## 2019-04-29 ENCOUNTER — Other Ambulatory Visit: Payer: Self-pay

## 2019-04-29 VITALS — BP 130/80 | HR 88 | Temp 97.5°F | Ht 67.5 in | Wt 318.0 lb

## 2019-04-29 DIAGNOSIS — Z6841 Body Mass Index (BMI) 40.0 and over, adult: Secondary | ICD-10-CM

## 2019-04-29 DIAGNOSIS — N950 Postmenopausal bleeding: Secondary | ICD-10-CM

## 2019-04-29 DIAGNOSIS — Z01419 Encounter for gynecological examination (general) (routine) without abnormal findings: Secondary | ICD-10-CM | POA: Diagnosis not present

## 2019-04-29 DIAGNOSIS — Z124 Encounter for screening for malignant neoplasm of cervix: Secondary | ICD-10-CM

## 2019-04-29 NOTE — Patient Instructions (Signed)
Mammogram Facilities  Yearly screening mammograms are recommended for women beginning at age 57. For a routine screening mammogram, you may schedule the appointment and have it done at the location of your choice.  Please ask the facility to send the results to our office. (fax [phone]) Location options include:  [facility], [place], [place] [address] [phone]  EXERCISE AND DIET:  We recommended that you start or continue a regular exercise program for good health. Regular exercise means any activity that makes your heart beat faster and makes you sweat.  We recommend exercising at least 30 minutes per day at least 3 days a week, preferably 4 or 5.  We also recommend a diet low in fat and sugar.  Inactivity, poor dietary choices and obesity can cause diabetes, heart attack, stroke, and kidney damage, among others.    ALCOHOL AND SMOKING:  Women should limit their alcohol intake to no more than 7 drinks/beers/glasses of wine (combined, not each!) per week. Moderation of alcohol intake to this level decreases your risk of breast cancer and liver damage. And of course, no recreational drugs are part of a healthy lifestyle.  And absolutely no smoking or even second hand smoke. Most people know smoking can cause heart and lung diseases, but did you know it also contributes to weakening of your bones? Aging of your skin?  Yellowing of your teeth and nails?  CALCIUM AND VITAMIN D:  Adequate intake of calcium and Vitamin D are recommended.  The recommendations for exact amounts of these supplements seem to change often, but generally speaking 1,200 mg of calcium (between diet and supplement) and 800 units of Vitamin D per day seems prudent. Certain women may benefit from higher intake of Vitamin D.  If you are among these women, your doctor will have told you during your visit.    PAP SMEARS:  Pap smears, to check for cervical cancer or  precancers,  have traditionally been done yearly, although recent scientific advances have shown that most women can have pap smears less often.  However, every woman still should have a physical exam from her gynecologist every year. It will include a breast check, inspection of the vulva and vagina to check for abnormal growths or skin changes, a visual exam of the cervix, and then an exam to evaluate the size and shape of the uterus and ovaries.  And after 57 years of age, a rectal exam is indicated to check for rectal cancers. We will also provide age appropriate advice regarding health maintenance, like when you should have certain vaccines, screening for sexually transmitted diseases, bone density testing, colonoscopy, mammograms, etc.   MAMMOGRAMS:  All women over 1 years old should have a yearly mammogram. Many facilities now offer a "3D" mammogram, which may cost around $50 extra out of pocket. If possible,  we recommend you accept the option to have the 3D mammogram performed.  It both reduces the number of women who will be called back for extra views which then turn out to be normal, and it is better than the routine mammogram at detecting truly abnormal areas.    COLON CANCER SCREENING: Now recommend starting at age 66. At this time colonoscopy is not covered for routine screening until 50. There are take home tests that can be done between 45-49.   COLONOSCOPY:  Colonoscopy to screen for colon cancer is recommended for all women at age 61.  We know, you  hate the idea of the prep.  We agree, BUT, having colon cancer and not knowing it is worse!!  Colon cancer so often starts as a polyp that can be seen and removed at colonscopy, which can quite literally save your life!  And if your first colonoscopy is normal and you have no family history of colon cancer, most women don't have to have it again for 10 years.  Once every ten years, you can do something that may end up saving your life, right?  We  will be happy to help you get it scheduled when you are ready.  Be sure to check your insurance coverage so you understand how much it will cost.  It may be covered as a preventative service at no cost, but you should check your particular policy.      Breast Self-Awareness Breast self-awareness means being familiar with how your breasts look and feel. It involves checking your breasts regularly and reporting any changes to your health care provider. Practicing breast self-awareness is important. A change in your breasts can be a sign of a serious medical problem. Being familiar with how your breasts look and feel allows you to find any problems early, when treatment is more likely to be successful. All women should practice breast self-awareness, including women who have had breast implants. How to do a breast self-exam One way to learn what is normal for your breasts and whether your breasts are changing is to do a breast self-exam. To do a breast self-exam: Look for Changes  1. Remove all the clothing above your waist. 2. Stand in front of a mirror in a room with good lighting. 3. Put your hands on your hips. 4. Push your hands firmly downward. 5. Compare your breasts in the mirror. Look for differences between them (asymmetry), such as: ? Differences in shape. ? Differences in size. ? Puckers, dips, and bumps in one breast and not the other. 6. Look at each breast for changes in your skin, such as: ? Redness. ? Scaly areas. 7. Look for changes in your nipples, such as: ? Discharge. ? Bleeding. ? Dimpling. ? Redness. ? A change in position. Feel for Changes Carefully feel your breasts for lumps and changes. It is best to do this while lying on your back on the floor and again while sitting or standing in the shower or tub with soapy water on your skin. Feel each breast in the following way:  Place the arm on the side of the breast you are examining above your head.  Feel your  breast with the other hand.  Start in the nipple area and make  inch (2 cm) overlapping circles to feel your breast. Use the pads of your three middle fingers to do this. Apply light pressure, then medium pressure, then firm pressure. The light pressure will allow you to feel the tissue closest to the skin. The medium pressure will allow you to feel the tissue that is a little deeper. The firm pressure will allow you to feel the tissue close to the ribs.  Continue the overlapping circles, moving downward over the breast until you feel your ribs below your breast.  Move one finger-width toward the center of the body. Continue to use the  inch (2 cm) overlapping circles to feel your breast as you move slowly up toward your collarbone.  Continue the up and down exam using all three pressures until you reach your armpit.  Write Down What You  Find  Write down what is normal for each breast and any changes that you find. Keep a written record with breast changes or normal findings for each breast. By writing this information down, you do not need to depend only on memory for size, tenderness, or location. Write down where you are in your menstrual cycle, if you are still menstruating. If you are having trouble noticing differences in your breasts, do not get discouraged. With time you will become more familiar with the variations in your breasts and more comfortable with the exam. How often should I examine my breasts? Examine your breasts every month. If you are breastfeeding, the best time to examine your breasts is after a feeding or after using a breast pump. If you menstruate, the best time to examine your breasts is 5-7 days after your period is over. During your period, your breasts are lumpier, and it may be more difficult to notice changes. When should I see my health care provider? See your health care provider if you notice:  A change in shape or size of your breasts or nipples.  A change  in the skin of your breast or nipples, such as a reddened or scaly area.  Unusual discharge from your nipples.  A lump or thick area that was not there before.  Pain in your breasts.  Anything that concerns you.  Endometrial Biopsy Post-procedure Instructions . Cramping is common.  You may take Ibuprofen, Aleve, or Tylenol for the cramping.  This should resolve within 24 hours.   . You may have a small amount of spotting.  You should wear a mini pad for the next few days. . You may have intercourse in 24 hours. . You need to call the office if you have any pelvic pain, fever, heavy bleeding, or foul smelling vaginal discharge. . Shower or bathe as normal . You will be notified within one week of your biopsy results or we will discuss your results at your follow-up appointment if needed.

## 2019-04-29 NOTE — Progress Notes (Addendum)
59 y.o. G3P3003 Single Black or African American Not Hispanic or Latino female here for annual exam.  Patient states that on Feb 23 she wiped and saw a little blood she says that she could only see it when wiped and it stopped on March 1. No pain.  She has a long term partner, not sexually active prior to the bleeding. No STD concerns. No dyspareunia.     H/O HTN, elevated lipids, goiter, elevated BMI and DM. Last HgbA1C was 7.7.   Patient's last menstrual period was 11/09/2013.          Sexually active: Yes.    The current method of family planning is post menopausal status.    Exercising: Yes.    walking Smoker:  no  Health Maintenance: Pap:  03/21/2017 WNL NEG HR HPV, 03-20-16 WNL NEG HR HPV, 05-27-13 LGSIL NEG HR HPV History of abnormal Pap:  Yes LGSIL 2015 MMG:  03/23/16 Density B Bi-rads 2 benign BMD:   never Colonoscopy: 09-21-13 normal TDaP:  05/27/13 Gardasil: na   reports that she has never smoked. She has never used smokeless tobacco. She reports that she does not drink alcohol or use drugs. She is on disability. 3 grown kids (all boys),13grand kids   Past Medical History:  Diagnosis Date  . Back pain   . Diabetes mellitus without complication ([place])   . Goiter    left side, saw dr tysinger april 2015 for, had biopsy done was told it is cyst  . H/O echocardiogram    Dr. Charolette Forward, [facility]  . Hyperlipidemia   . Hypertension 2000  . Impaired fasting blood sugar 2014  . Obesity   . Other screening mammogram    planned for 05/2013  . Routine gynecological examination    last pap 2012  . Sarcoid   . Sleep apnea    on CPAP setting of 2    Past Surgical History:  Procedure Laterality Date  . COLONOSCOPY WITH PROPOFOL N/A 09/21/2013   Procedure: COLONOSCOPY WITH PROPOFOL;  Surgeon: Juanita Craver, MD;  Location: [facility];  Service: Endoscopy;  Laterality: N/A;  . CYST EXCISION     right low back  . TUBAL LIGATION    . VIDEO BRONCHOSCOPY  Bilateral 07/05/2016   Procedure: VIDEO BRONCHOSCOPY WITH FLUORO;  Surgeon: Tanda Rockers, MD;  Location: [facility];  Service: Endoscopy;  Laterality: Bilateral;    Current Outpatient Medications  Medication Sig Dispense Refill  . allopurinol (ZYLOPRIM) 100 MG tablet Take 1 tablet (100 mg total) by mouth daily. 90 tablet 3  . amLODIPine-Valsartan-HCTZ (EXFORGE HCT) 10-320-25 MG TABS Take 1 tablet by mouth daily. 90 tablet 3  . ASPIRIN LOW DOSE 81 MG EC tablet TAKE 1 TABLET BY MOUTH EVERY DAY 90 tablet 0  . bisoprolol (ZEBETA) 10 MG tablet TAKE 1 TABLET BY MOUTH EVERY DAY 90 tablet 0  . Blood Glucose Monitoring Suppl (ONE TOUCH ULTRA 2) w/Device KIT 1 Device by Does not apply route 2 (two) times daily. 1 each 0  . cholecalciferol (VITAMIN D3) 25 MCG (1000 UT) tablet Take 1 tablet (1,000 Units total) by mouth daily. 90 tablet 3  . cloNIDine (CATAPRES) 0.2 MG tablet TAKE 1 TABLET (0.2 MG TOTAL) BY MOUTH 3 (THREE) TIMES DAILY. 270 tablet 1  . dapagliflozin propanediol (FARXIGA) 5 MG TABS tablet Take 5 mg by mouth daily before breakfast. 30 tablet 11  . famotidine (PEPCID) 20 MG tablet TAKE 1 TABLET BY MOUTH EVERYDAY AT BEDTIME 90  tablet 3  . gabapentin (NEURONTIN) 100 MG capsule TAKE 1 CAPSULE (100 MG TOTAL) BY MOUTH AT BEDTIME. 90 capsule 3  . Insulin Degludec (TRESIBA FLEXTOUCH) 200 UNIT/ML SOPN Inject 120 Units into the skin daily. 18 pen 2  . Lancets (ONETOUCH DELICA PLUS [id number]) MISC 100 PENS BY DOES NOT APPLY ROUTE 2 (TWO) TIMES DAILY. 100 each 2  . metFORMIN (GLUCOPHAGE) 1000 MG tablet TAKE 1 TABLET (1,000 MG TOTAL) BY MOUTH 2 (TWO) TIMES DAILY WITH A MEAL. 180 tablet 3  . ONETOUCH VERIO test strip CHECKS SUGARS 4 TIMES DAILY, ON MEAL TIME INSULIN 100 strip 6  . OZEMPIC, 1 MG/DOSE, 2 MG/1.5ML SOPN INJECT 1 MG INTO THE SKIN ONCE A WEEK. 1 pen 11  . pantoprazole (PROTONIX) 40 MG tablet Take 1 tablet (40 mg total) by mouth daily. Take 30-60 min before first meal of the day 30 tablet 2  .  potassium chloride (KLOR-CON 10) 10 MEQ tablet Take 1 tablet (10 mEq total) by mouth 2 (two) times daily. 180 tablet 3  . rosuvastatin (CRESTOR) 20 MG tablet TAKE 1 TABLET BY MOUTH EVERY DAY 90 tablet 0  . Insulin Pen Needle (BD PEN NEEDLE NANO U/F) 32G X 4 MM MISC USE 4X A DAY 200 each 6   No current facility-administered medications for this visit.    Family History  Problem Relation Age of Onset  . Heart disease Father   . Hypertension Father   . Heart disease Brother   . Cancer Neg Hx   . Stroke Neg Hx   . Diabetes Neg Hx     Review of Systems  Constitutional: Negative.   HENT: Negative.   Eyes: Negative.   Respiratory: Negative.   Cardiovascular: Negative.   Gastrointestinal: Negative.   Endocrine: Negative.   Genitourinary: Positive for vaginal bleeding.  Musculoskeletal: Negative.   Skin: Negative.   Allergic/Immunologic: Negative.   Neurological: Negative.   Hematological: Negative.   Psychiatric/Behavioral: Negative.     Exam:   BP 130/80   Pulse 88   Temp (!) 97.5 F (36.4 C)   Ht 5' 7.5" (1.715 m)   Wt (!) 318 lb (144.2 kg)   LMP 11/09/2013   SpO2 93%   BMI 49.07 kg/m   Weight change: _0 @ Height:   Height: 5' 7.5" (171.5 cm)  Ht Readings from Last 3 Encounters:  04/29/19 5' 7.5" (1.715 m)  02/09/19 _1  (1.702 m)  09/23/18 _2  (1.702 m)    General appearance: alert, cooperative and appears stated age Head: Normocephalic, without obvious abnormality, atraumatic Neck: no adenopathy, supple, symmetrical, trachea midline and thyroid normal to inspection and palpation Lungs: clear to auscultation bilaterally Cardiovascular: regular rate and rhythm Breasts: normal appearance, no masses or tenderness Abdomen: soft, non-tender; non distended,  no masses,  no organomegaly Extremities: extremities normal, atraumatic, no cyanosis or edema Skin: Skin color, texture, turgor normal. No rashes or lesions Lymph nodes: Cervical, supraclavicular, and  axillary nodes normal. No abnormal inguinal nodes palpated Neurologic: Grossly normal   Pelvic: External genitalia:  no lesions              Urethra:  normal appearing urethra with no masses, tenderness or lesions              Bartholins and Skenes: normal                 Vagina: normal appearing vagina with normal color and discharge, no lesions  Cervix: no lesions               Bimanual Exam:  Uterus:  no masses or tenderness. Exam limited by BMI              Adnexa: no mass, fullness, tenderness               Rectovaginal: Confirms               Anus:  normal sphincter tone, no lesions  The risks of endometrial biopsy were reviewed and a consent was obtained.  A speculum was placed in the vagina and the cervix was cleansed with betadine. A pipelle was placed into the uterus, minimal tissue was obtained with the biopsy. A tenaculum was placed on the cervix and the pipelle was placed into the endometrial cavity. The uterus sounded to 9 cm. The endometrial biopsy was performed, taking care to get a representative sample, sampling 360 degrees of the uterine cavity. Minimal tissue was obtained. The tenaculum and speculum were removed. There were no complications.    Gae Dry chaperoned for the exam.  A:  Well Woman with normal exam  BMI 66  PMP bleeding  P:   Pap with reflex hpv  Endometrial biopsy done  Return for sonohysterogram  Labs with primary  Colonoscopy UTD  Mammogram over due  Declines referral to weight loss clinic  Discussed breast self exam  Discussed calcium and vit D intake  Addendum: goiter palpated

## 2019-04-30 ENCOUNTER — Other Ambulatory Visit: Payer: Self-pay | Admitting: Medical

## 2019-04-30 LAB — CYTOLOGY - PAP
Adequacy: ABSENT
Diagnosis: NEGATIVE

## 2019-05-04 LAB — SURGICAL PATHOLOGY

## 2019-05-06 ENCOUNTER — Other Ambulatory Visit: Payer: Self-pay

## 2019-05-09 ENCOUNTER — Other Ambulatory Visit: Payer: Self-pay | Admitting: Medical

## 2019-05-11 ENCOUNTER — Ambulatory Visit: Payer: BC Managed Care – PPO | Admitting: Endocrinology

## 2019-05-11 ENCOUNTER — Encounter: Payer: Self-pay | Admitting: Endocrinology

## 2019-05-11 ENCOUNTER — Other Ambulatory Visit: Payer: Self-pay

## 2019-05-11 VITALS — BP 154/88 | HR 112 | Ht 67.5 in | Wt 320.0 lb

## 2019-05-11 DIAGNOSIS — Z794 Long term (current) use of insulin: Secondary | ICD-10-CM | POA: Diagnosis not present

## 2019-05-11 DIAGNOSIS — E1165 Type 2 diabetes mellitus with hyperglycemia: Secondary | ICD-10-CM | POA: Diagnosis not present

## 2019-05-11 LAB — BASIC METABOLIC PANEL
BUN: 14 mg/dL (ref 6–23)
CO2: 33 mEq/L — ABNORMAL HIGH (ref 19–32)
Calcium: 9.6 mg/dL (ref 8.4–10.5)
Chloride: 98 mEq/L (ref 96–112)
Creatinine, Ser: 1.03 mg/dL (ref 0.40–1.20)
GFR: 66.94 mL/min (ref >60.00)
Glucose, Bld: 277 mg/dL — ABNORMAL HIGH (ref 70–99)
Potassium: 3.3 mEq/L — ABNORMAL LOW (ref 3.5–5.1)
Sodium: 140 mEq/L (ref 135–145)

## 2019-05-11 LAB — POCT GLYCOSYLATED HEMOGLOBIN (HGB A1C): Hemoglobin A1C: 8.5 % — AB (ref 4.0–5.6)

## 2019-05-11 LAB — TSH: TSH: 4.53 u[IU]/mL — ABNORMAL HIGH (ref 0.35–4.50)

## 2019-05-11 MED ORDER — LEVOTHYROXINE SODIUM 25 MCG PO TABS: 25.0000 ug | ORAL_TABLET | Freq: Every day | ORAL | 3 refills | Status: DC

## 2019-05-11 MED ORDER — TRESIBA FLEXTOUCH 200 UNIT/ML ~~LOC~~ SOPN: 130.0000 [IU] | PEN_INJECTOR | Freq: Every day | SUBCUTANEOUS | 2 refills | Status: DC

## 2019-05-11 NOTE — Patient Instructions (Addendum)
Please increase the Tresiba to 130 units daily, and: continue the same other diabetes medications. Blood tests are requested for you today.  We'll let you know about the results.  Your blood pressure is high today.  Please see your primary care provider soon, to have it rechecked.   check your blood sugar twice a day.  vary the time of day when you check, between before the 3 meals, and at bedtime.  also check if you have symptoms of your blood sugar being too high or too low.  please keep a record of the readings and bring it to your next appointment here (or you can bring the meter itself).  You can write it on any piece of paper.  please call us sooner if your blood sugar goes below 70, or if you have a lot of readings over 200. Please come back for a follow-up appointment in 2-3 months.

## 2019-05-11 NOTE — Progress Notes (Signed)
Subjective:    Patient ID: Amy Rosario, female    DOB: November 17, 1960, 60 y.o.   MRN: [id number]  HPI Pt returns for f/u of diabetes mellitus: DM type: Insulin-requiring type 2.   Dx'ed: 2015.  Complications: none Therapy: insulin since 2017, Ozempic, and metformin.  GDM: never DKA: never Severe hypoglycemia: never Pancreatitis: never.  Pancreatic imaging: normal on 2018 [place].  Other: she takes QD insulin, after poor results with multiple daily injections; she takes intermitt prednisone for sarcoidosis.   Interval history: no recent steroids.  no cbg record, but states cbg's vary from 76-200.  It is in general higher as the day goes on.  Pt says she occasionally misses the [place].  She stopped farxiga, due to vaginal bleeding.   Pt also has MNG (dx'ed 2015; bx in 2020 showed Amy Rosario 3, and affirma reported insuff material; f/u US in 2020 was unchanged).   Past Medical History:  Diagnosis Date  . Back pain   . Diabetes mellitus without complication (Amy Rosario)   . Goiter    left side, saw dr tysinger april 2015 for, had biopsy done was told it is cyst  . H/O echocardiogram    Dr. Charolette [facility], [facility]  . Hyperlipidemia   . Hypertension 2000  . Impaired fasting blood sugar 2014  . Obesity   . Other screening mammogram    planned for 05/2013  . Routine gynecological examination    last pap 2012  . Sarcoid   . Sleep apnea    on CPAP setting of 2    Past Surgical History:  Procedure Laterality Date  . COLONOSCOPY WITH PROPOFOL N/A 09/21/2013   Procedure: COLONOSCOPY WITH PROPOFOL;  Surgeon: Juanita Craver, MD;  Location: [facility];  Service: Endoscopy;  Laterality: N/A;  . CYST EXCISION     right low back  . TUBAL LIGATION    . VIDEO BRONCHOSCOPY Bilateral 07/05/2016   Procedure: VIDEO BRONCHOSCOPY WITH FLUORO;  Surgeon: Tanda Rockers, MD;  Location: [facility];  Service: Endoscopy;  Laterality: Bilateral;    Social History   Socioeconomic History   . Marital status: Single    Spouse name: Not on file  . Number of children: Not on file  . Years of education: Not on file  . Highest education level: Not on file  Occupational History  . Not on file  Tobacco Use  . Smoking status: Never Smoker  . Smokeless tobacco: Never Used  Substance and Sexual Activity  . Alcohol use: No  . Drug use: No  . Sexual activity: Not Currently    Partners: Male    Birth control/protection: Post-menopausal, None  Other Topics Concern  . Not on file  Social History Narrative   Lives with sister Amy Rosario, single, 3 sons, Exercise -walks a lot on the job, Consulting civil engineer at [employer], visit different churches   Social Determinants of Radio broadcast assistant Strain:   . Difficulty of Paying Living Expenses:   Food Insecurity:   . Worried About Charity fundraiser in the Last Year:   . Arboriculturist in the Last Year:   Transportation Needs:   . Film/video editor (Medical):   Marland Kitchen Lack of Transportation (Non-Medical):   Physical Activity:   . Days of Exercise per Week:   . Minutes of Exercise per Session:   Stress:   . Feeling of Stress :   Social Connections:   . Frequency of Communication with  Friends and Family:   . Frequency of Social Gatherings with Friends and Family:   . Attends Religious Services:   . Active Member of Clubs or Organizations:   . Attends Archivist Meetings:   Marland Kitchen Marital Status:   Intimate Partner Violence:   . Fear of Current or Ex-Partner:   . Emotionally Abused:   Marland Kitchen Physically Abused:   . Sexually Abused:     Current Outpatient Medications on File Prior to Visit  Medication Sig Dispense Refill  . ASPIRIN LOW DOSE 81 MG EC tablet TAKE 1 TABLET BY MOUTH EVERY DAY 90 tablet 0  . bisoprolol (ZEBETA) 10 MG tablet TAKE 1 TABLET BY MOUTH EVERY DAY 90 tablet 0  . Blood Glucose Monitoring Suppl (ONE TOUCH ULTRA 2) w/Device KIT 1 Device by Does not apply route 2 (two) times daily. 1 each 0   . cholecalciferol (VITAMIN D3) 25 MCG (1000 UNIT) tablet TAKE 1 TABLET BY MOUTH EVERY DAY 90 tablet 3  . cloNIDine (CATAPRES) 0.2 MG tablet TAKE 1 TABLET (0.2 MG TOTAL) BY MOUTH 3 (THREE) TIMES DAILY. 270 tablet 1  . famotidine (PEPCID) 20 MG tablet TAKE 1 TABLET BY MOUTH EVERYDAY AT BEDTIME 90 tablet 3  . gabapentin (NEURONTIN) 100 MG capsule TAKE 1 CAPSULE (100 MG TOTAL) BY MOUTH AT BEDTIME. 90 capsule 3  . Insulin Pen Needle (BD PEN NEEDLE NANO U/F) 32G X 4 MM MISC USE 4X A DAY 200 each 6  . Lancets (ONETOUCH DELICA PLUS [id number]) MISC 100 PENS BY DOES NOT APPLY ROUTE 2 (TWO) TIMES DAILY. 100 each 2  . metFORMIN (GLUCOPHAGE) 1000 MG tablet TAKE 1 TABLET (1,000 MG TOTAL) BY MOUTH 2 (TWO) TIMES DAILY WITH A MEAL. 180 tablet 3  . ONETOUCH VERIO test strip CHECKS SUGARS 4 TIMES DAILY, ON MEAL TIME INSULIN 100 strip 6  . OZEMPIC, 1 MG/DOSE, 2 MG/1.5ML SOPN INJECT 1 MG INTO THE SKIN ONCE A WEEK. 1 pen 11  . pantoprazole (PROTONIX) 40 MG tablet Take 1 tablet (40 mg total) by mouth daily. Take 30-60 min before first meal of the day 30 tablet 2  . potassium chloride (KLOR-CON 10) 10 MEQ tablet Take 1 tablet (10 mEq total) by mouth 2 (two) times daily. 180 tablet 3  . rosuvastatin (CRESTOR) 20 MG tablet TAKE 1 TABLET BY MOUTH EVERY DAY 90 tablet 0  . allopurinol (ZYLOPRIM) 100 MG tablet TAKE 1 TABLET BY MOUTH EVERY DAY 90 tablet 3  . amLODIPine-Valsartan-HCTZ 10-320-25 MG TABS TAKE 1 TABLET BY MOUTH EVERY DAY 90 tablet 3   No current facility-administered medications on file prior to visit.    Allergies  Allergen Reactions  . Penicillins Hives and Swelling    Has patient had a PCN reaction causing immediate rash, facial/tongue/throat swelling, SOB or lightheadedness with hypotension: Yes Has patient had a PCN reaction causing severe rash involving mucus membranes or skin necrosis: No Has patient had a PCN reaction that required hospitalization: No Has patient had a PCN reaction occurring within  the last 10 years: No If all of the above answers are "NO", then may proceed with Cephalosporin use.    Family History  Problem Relation Age of Onset  . Heart disease Father   . Hypertension Father   . Heart disease Brother   . Cancer Neg Hx   . Stroke Neg Hx   . Diabetes Neg Hx     BP (!) 154/88   Pulse (!) 112   Ht 5' 7.5" (1.715  m)   Wt (!) 320 lb (145.2 kg)   LMP 11/09/2013   SpO2 92%   BMI 49.38 kg/m    Review of Systems She denies hypoglycemia    Objective:   Physical Exam VITAL SIGNS:  See vs page GENERAL: no distress Pulses: dorsalis pedis intact bilat.   MSK: no deformity of the feet CV: trace bilat leg edema Skin:  no ulcer on the feet.  normal color and temp on the feet. Neuro: sensation is intact to touch on the feet.    Lab Results  Component Value Date   CREATININE 0.97 04/02/2018   BUN 14 04/02/2018   NA 140 04/02/2018   K 3.1 (L) 04/02/2018   CL 98 04/02/2018   CO2 31 04/02/2018     Lab Results  Component Value Date   HGBA1C 8.5 (A) 05/11/2019       Assessment & Plan:  Insulin-requiring type 2 DM: worse HTN: is noted today MNG: check TSH today.  Vag bleeding, new.  This limits rx options.  Patient Instructions  Please increase the Tresiba to 130 units daily, and: continue the same other diabetes medications. Blood tests are requested for you today.  We'll let you know about the results.  Your blood pressure is high today.  Please see your primary care provider soon, to have it rechecked.   check your blood sugar twice a day.  vary the time of day when you check, between before the 3 meals, and at bedtime.  also check if you have symptoms of your blood sugar being too high or too low.  please keep a record of the readings and bring it to your next appointment here (or you can bring the meter itself).  You can write it on any piece of paper.  please call us sooner if your blood sugar goes below 70, or if you have a lot of readings over  200. Please come back for a follow-up appointment in 2-3 months.

## 2019-05-15 ENCOUNTER — Other Ambulatory Visit: Payer: Self-pay | Admitting: Medical

## 2019-05-15 ENCOUNTER — Telehealth: Payer: Self-pay | Admitting: Medical

## 2019-05-15 MED ORDER — AMLODIPINE BESYLATE 10 MG PO TABS: 10.0000 mg | ORAL_TABLET | Freq: Every day | ORAL | 1 refills | Status: DC

## 2019-05-15 MED ORDER — VALSARTAN-HYDROCHLOROTHIAZIDE 320-25 MG PO TABS: 1.0000 | ORAL_TABLET | Freq: Every day | ORAL | 1 refills | Status: DC

## 2019-05-15 NOTE — Telephone Encounter (Signed)
Pharmacy sent request stating that the RX amlod-valsa-hctz 10-320-25 mg is on back order, they are requesting that are wanting to know if you can separate the drugs please resend rx to the [id number] - [place], [place]

## 2019-05-15 NOTE — Telephone Encounter (Signed)
done

## 2019-05-19 ENCOUNTER — Ambulatory Visit: Payer: BC Managed Care – PPO | Admitting: Obstetrics and Gynecology

## 2019-05-19 ENCOUNTER — Other Ambulatory Visit: Payer: Self-pay | Admitting: Obstetrics and Gynecology

## 2019-05-19 ENCOUNTER — Ambulatory Visit (INDEPENDENT_AMBULATORY_CARE_PROVIDER_SITE_OTHER): Payer: BC Managed Care – PPO

## 2019-05-19 ENCOUNTER — Other Ambulatory Visit: Payer: Self-pay

## 2019-05-19 ENCOUNTER — Encounter: Payer: Self-pay | Admitting: Obstetrics and Gynecology

## 2019-05-19 ENCOUNTER — Telehealth: Payer: Self-pay

## 2019-05-19 VITALS — BP 118/68 | HR 92 | Temp 97.2°F | Ht 67.0 in | Wt 311.0 lb

## 2019-05-19 DIAGNOSIS — I10 Essential (primary) hypertension: Secondary | ICD-10-CM

## 2019-05-19 DIAGNOSIS — E785 Hyperlipidemia, unspecified: Secondary | ICD-10-CM

## 2019-05-19 DIAGNOSIS — N950 Postmenopausal bleeding: Secondary | ICD-10-CM

## 2019-05-19 DIAGNOSIS — D869 Sarcoidosis, unspecified: Secondary | ICD-10-CM

## 2019-05-19 DIAGNOSIS — E1165 Type 2 diabetes mellitus with hyperglycemia: Secondary | ICD-10-CM | POA: Diagnosis not present

## 2019-05-19 DIAGNOSIS — N840 Polyp of corpus uteri: Secondary | ICD-10-CM

## 2019-05-19 DIAGNOSIS — D219 Benign neoplasm of connective and other soft tissue, unspecified: Secondary | ICD-10-CM | POA: Diagnosis not present

## 2019-05-19 DIAGNOSIS — Z794 Long term (current) use of insulin: Secondary | ICD-10-CM

## 2019-05-19 DIAGNOSIS — Z9989 Dependence on other enabling machines and devices: Secondary | ICD-10-CM

## 2019-05-19 DIAGNOSIS — G4733 Obstructive sleep apnea (adult) (pediatric): Secondary | ICD-10-CM

## 2019-05-19 NOTE — Telephone Encounter (Signed)
-----   Message from Renato Shin, MD sent at 05/19/2019  4:34 PM EDT ----- please contact patient: You need to get cleared for the surgery.  Please come back for a follow-up appointment in 2 weeks ----- Message ----- From: Salvadore Dom, MD Sent: 05/19/2019   4:28 PM EDT To: Renato Shin, MD

## 2019-05-19 NOTE — Progress Notes (Signed)
GYNECOLOGY  VISIT   HPI: 59 y.o.   Single Black or African American Not Hispanic or Latino  female   [id number] with Patient's last menstrual period was 11/09/2013.   here for ultrasound consult for postmenopausal bleeding. Endometrial biopsy with weakly proliferative endometrium. Pap was negative.  She has multiple medical problems, including; diabetes, HTN, elevated lipids, elevated BMI, and sleep apnea.  Recent HgbA1C was 8.5, adjustment was made in her medication, but she had increased bleeding so stopped the medication (I told her that the polyps and hormones are the cause of her bleeding, not the medication).   GYNECOLOGIC HISTORY: Patient's last menstrual period was 11/09/2013. Contraception:PMP Menopausal hormone therapy: none        OB History    Gravida  3   Para  3   Term  3   Preterm      AB      Living  3     SAB      TAB      Ectopic      Multiple      Live Births                 Patient Active Problem List   Diagnosis Date Noted  . Decreased pedal pulses 02/24/2018  . Vitamin D deficiency 05/20/2017  . Diffuse goiter 05/04/2017  . Upper airway cough syndrome 05/03/2017  . Bullous impetigo 12/04/2016  . Nail hypertrophy 10/04/2016  . Vaccine counseling 10/04/2016  . Need for influenza vaccination 10/04/2016  . Gout 08/29/2016  . Sarcoidosis 04/18/2016  . Dizziness 03/26/2016  . Chest pain 03/26/2016  . SOB (shortness of breath) 03/26/2016  . Elevated LFTs 03/08/2016  . Elevated alkaline phosphatase level 03/08/2016  . Renal insufficiency 03/08/2016  . Abnormal ultrasound of abdomen 03/08/2016  . Fatty liver 03/08/2016  . Weight loss 03/08/2016  . Screening mammogram, encounter for 03/08/2016  . Abnormal cervical Papanicolaou smear 03/08/2016  . Acute stress reaction 11/04/2015  . Work-related stress 11/04/2015  . Type 2 diabetes mellitus with hyperglycemia, with long-term current use of insulin ([place]) 06/27/2015  . OSA on CPAP 06/27/2015   . Morbid obesity due to excess calories ([place]) 05/27/2013  . Essential hypertension, benign 05/27/2013  . Hyperlipidemia 05/27/2013    Past Medical History:  Diagnosis Date  . Back pain   . Diabetes mellitus without complication ([place])   . Goiter    left side, saw dr tysinger april 2015 for, had biopsy done was told it is cyst  . H/O echocardiogram    Dr. Charolette Forward, [facility]  . Hyperlipidemia   . Hypertension 2000  . Impaired fasting blood sugar 2014  . Obesity   . Other screening mammogram    planned for 05/2013  . Routine gynecological examination    last pap 2012  . Sarcoid   . Sleep apnea    on CPAP setting of 2    Past Surgical History:  Procedure Laterality Date  . COLONOSCOPY WITH PROPOFOL N/A 09/21/2013   Procedure: COLONOSCOPY WITH PROPOFOL;  Surgeon: Juanita Craver, MD;  Location: [facility];  Service: Endoscopy;  Laterality: N/A;  . CYST EXCISION     right low back  . TUBAL LIGATION    . VIDEO BRONCHOSCOPY Bilateral 07/05/2016   Procedure: VIDEO BRONCHOSCOPY WITH FLUORO;  Surgeon: Tanda Rockers, MD;  Location: [facility];  Service: Endoscopy;  Laterality: Bilateral;    Current Outpatient Medications  Medication Sig Dispense Refill  . allopurinol (ZYLOPRIM) 100  MG tablet TAKE 1 TABLET BY MOUTH EVERY DAY 90 tablet 3  . amLODipine (NORVASC) 10 MG tablet Take 1 tablet (10 mg total) by mouth daily. 30 tablet 1  . amLODIPine-Valsartan-HCTZ 10-320-25 MG TABS TAKE 1 TABLET BY MOUTH EVERY DAY 90 tablet 3  . ASPIRIN LOW DOSE 81 MG EC tablet TAKE 1 TABLET BY MOUTH EVERY DAY 90 tablet 0  . bisoprolol (ZEBETA) 10 MG tablet TAKE 1 TABLET BY MOUTH EVERY DAY 90 tablet 0  . Blood Glucose Monitoring Suppl (ONE TOUCH ULTRA 2) w/Device KIT 1 Device by Does not apply route 2 (two) times daily. 1 each 0  . cholecalciferol (VITAMIN D3) 25 MCG (1000 UNIT) tablet TAKE 1 TABLET BY MOUTH EVERY DAY 90 tablet 3  . cloNIDine (CATAPRES) 0.2 MG tablet TAKE 1  TABLET (0.2 MG TOTAL) BY MOUTH 3 (THREE) TIMES DAILY. 270 tablet 1  . famotidine (PEPCID) 20 MG tablet TAKE 1 TABLET BY MOUTH EVERYDAY AT BEDTIME 90 tablet 3  . gabapentin (NEURONTIN) 100 MG capsule TAKE 1 CAPSULE (100 MG TOTAL) BY MOUTH AT BEDTIME. 90 capsule 3  . insulin degludec (TRESIBA FLEXTOUCH) 200 UNIT/ML FlexTouch Pen Inject 130 Units into the skin daily. 18 pen 2  . Insulin Pen Needle (BD PEN NEEDLE NANO U/F) 32G X 4 MM MISC USE 4X A DAY 200 each 6  . Lancets (ONETOUCH DELICA PLUS [id number]) MISC 100 PENS BY DOES NOT APPLY ROUTE 2 (TWO) TIMES DAILY. 100 each 2  . levothyroxine (SYNTHROID) 25 MCG tablet Take 1 tablet (25 mcg total) by mouth daily before breakfast. 90 tablet 3  . metFORMIN (GLUCOPHAGE) 1000 MG tablet TAKE 1 TABLET (1,000 MG TOTAL) BY MOUTH 2 (TWO) TIMES DAILY WITH A MEAL. 180 tablet 3  . ONETOUCH VERIO test strip CHECKS SUGARS 4 TIMES DAILY, ON MEAL TIME INSULIN 100 strip 6  . OZEMPIC, 1 MG/DOSE, 2 MG/1.5ML SOPN INJECT 1 MG INTO THE SKIN ONCE A WEEK. 1 pen 11  . pantoprazole (PROTONIX) 40 MG tablet Take 1 tablet (40 mg total) by mouth daily. Take 30-60 min before first meal of the day 30 tablet 2  . potassium chloride (KLOR-CON 10) 10 MEQ tablet Take 1 tablet (10 mEq total) by mouth 2 (two) times daily. 180 tablet 3  . rosuvastatin (CRESTOR) 20 MG tablet TAKE 1 TABLET BY MOUTH EVERY DAY 90 tablet 0  . valsartan-hydrochlorothiazide (DIOVAN-HCT) 320-25 MG tablet Take 1 tablet by mouth daily. 30 tablet 1   No current facility-administered medications for this visit.     ALLERGIES: Penicillins  Family History  Problem Relation Age of Onset  . Heart disease Father   . Hypertension Father   . Heart disease Brother   . Cancer Neg Hx   . Stroke Neg Hx   . Diabetes Neg Hx     Social History   Socioeconomic History  . Marital status: Single    Spouse name: Not on file  . Number of children: Not on file  . Years of education: Not on file  . Highest education level:  Not on file  Occupational History  . Not on file  Tobacco Use  . Smoking status: Never Smoker  . Smokeless tobacco: Never Used  Substance and Sexual Activity  . Alcohol use: No  . Drug use: No  . Sexual activity: Not Currently    Partners: Male    Birth control/protection: Post-menopausal, None  Other Topics Concern  . 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 Difficulty of Paying Living Expenses:   Food Insecurity:   . Worried About Charity fundraiser in the Last Year:   . Arboriculturist in the Last Year:   Transportation Needs:   . Film/video editor (Medical):   Marland Kitchen Lack of Transportation (Non-Medical):   Physical Activity:   . Days of Exercise per Week:   . Minutes of Exercise per Session:   Stress:   . Feeling of Stress :   Social Connections:   . Frequency of Communication with Friends and Family:   . Frequency of Social Gatherings with Friends and Family:   . Attends Religious Services:   . Active Member of Clubs or Organizations:   . Attends Archivist Meetings:   Marland Kitchen Marital Status:   Intimate Partner Violence:   . Fear of Current or Ex-Partner:   . Emotionally Abused:   Marland Kitchen Physically Abused:   . Sexually Abused:     Review of Systems  All other systems reviewed and are negative.   PHYSICAL EXAMINATION:    BP 118/68   Pulse 92   Temp (!) 97.2 F (36.2 C)   Ht 5' 7"  (1.702 m)   Wt (!) 311 lb (141.1 kg)   LMP 11/09/2013   SpO2 98%   BMI 48.71 kg/m     General appearance: alert, cooperative and appears stated age Neck: supple, large goiter Heart: regular rate and rhythm Lungs: CTAB Abdomen: obese, soft, non-tender; bowel sounds normal; no masses,  no organomegaly Extremities: normal, atraumatic, no cyanosis Skin: normal color,  texture and turgor, no rashes or lesions Lymph: normal cervical supraclavicular and inguinal nodes Neurologic: grossly normal   Pelvic: External genitalia:  no lesions              Urethra:  normal appearing urethra with no masses, tenderness or lesions              Bartholins and Skenes: normal                 Vagina: normal appearing vagina with normal color and discharge, no lesions              Cervix: no lesions                Sonohysterogram The procedure and risks of the procedure were reviewed with the patient, consent form was signed. A speculum was placed in the vagina and the cervix was cleansed with betadine. The sonohysterogram catheter was inserted into the uterine cavity without difficulty. Saline was infused under direct observation with the ultrasound. 2 endometrial polyps were noted.The catheter was removed.   Chaperone was present for exam.  Ultrasound images were reviewed with the patient. She has 2 myomas, one deviates her cavity posteriorly. 2 endometrial polyps on sonohysterogram. See enclosed report and images.   Recent labs reviewed 05/11/19: glucose 277, creatinine 1.03, GFR 66.94, K 3.3, TSH 4.53, HgbA1C 8.5  ASSESSMENT Postmenopausal bleeding, weakly proliferative endometrium on biopsy, 2 polyps on sonohysterogram Multiple medical issues, including; diabetes, HTN, obesity, OSA, elevated lipids  BMI 48    PLAN Plan: hysteroscopy, polypectomy, dilation and curettage. Reviewed risks, including: bleeding, infection, and uterine perforation She will need pre-operative clearance Discussed importance of good control of her diabetes  ACOG handouts on hysteroscopy, dilation and curettage given  CC: Dorothea Ogle, PA-C         Dr Loanne Drilling  In addition to performing the sonohysterogram and reviewing the ultrasound results with the patient, ~15 minutes was spent in total patient care reviewing management/surgery and medical problems in relation to surgery.

## 2019-05-19 NOTE — H&P (View-Only) (Signed)
GYNECOLOGY  VISIT   HPI: 59 y.o.   Single Black or African American Not Hispanic or Latino  female   [phone] with Patient's last menstrual period was 11/09/2013.   here for ultrasound consult for postmenopausal bleeding. Endometrial biopsy with weakly proliferative endometrium. Pap was negative.  She has multiple medical problems, including; diabetes, HTN, elevated lipids, elevated BMI, and sleep apnea.  Recent HgbA1C was 8.5, adjustment was made in her medication, but she had increased bleeding so stopped the medication (I told her that the polyps and hormones are the cause of her bleeding, not the medication).   GYNECOLOGIC HISTORY: Patient's last menstrual period was 11/09/2013. Contraception:PMP Menopausal hormone therapy: none        OB History    Gravida  3   Para  3   Term  3   Preterm      AB      Living  3     SAB      TAB      Ectopic      Multiple      Live Births                 Patient Active Problem List   Diagnosis Date Noted  . Decreased pedal pulses 02/24/2018  . Vitamin D deficiency 05/20/2017  . Diffuse goiter 05/04/2017  . Upper airway cough syndrome 05/03/2017  . Bullous impetigo 12/04/2016  . Nail hypertrophy 10/04/2016  . Vaccine counseling 10/04/2016  . Need for influenza vaccination 10/04/2016  . Gout 08/29/2016  . Sarcoidosis 04/18/2016  . Dizziness 03/26/2016  . Chest pain 03/26/2016  . SOB (shortness of breath) 03/26/2016  . Elevated LFTs 03/08/2016  . Elevated alkaline phosphatase level 03/08/2016  . Renal insufficiency 03/08/2016  . Abnormal ultrasound of abdomen 03/08/2016  . Fatty liver 03/08/2016  . Weight loss 03/08/2016  . Screening mammogram, encounter for 03/08/2016  . Abnormal cervical Papanicolaou smear 03/08/2016  . Acute stress reaction 11/04/2015  . Work-related stress 11/04/2015  . Type 2 diabetes mellitus with hyperglycemia, with long-term current use of insulin ([place]) 06/27/2015  . OSA on CPAP 06/27/2015   . Morbid obesity due to excess calories ([place]) 05/27/2013  . Essential hypertension, benign 05/27/2013  . Hyperlipidemia 05/27/2013    Past Medical History:  Diagnosis Date  . Back pain   . Diabetes mellitus without complication ([place])   . Goiter    left side, saw dr tysinger april 2015 for, had biopsy done was told it is cyst  . H/O echocardiogram    Dr. Charolette Forward, [facility]  . Hyperlipidemia   . Hypertension 2000  . Impaired fasting blood sugar 2014  . Obesity   . Other screening mammogram    planned for 05/2013  . Routine gynecological examination    last pap 2012  . Sarcoid   . Sleep apnea    on CPAP setting of 2    Past Surgical History:  Procedure Laterality Date  . COLONOSCOPY WITH PROPOFOL N/A 09/21/2013   Procedure: COLONOSCOPY WITH PROPOFOL;  Surgeon: Juanita Craver, MD;  Location: [facility];  Service: Endoscopy;  Laterality: N/A;  . CYST EXCISION     right low back  . TUBAL LIGATION    . VIDEO BRONCHOSCOPY Bilateral 07/05/2016   Procedure: VIDEO BRONCHOSCOPY WITH FLUORO;  Surgeon: Tanda Rockers, MD;  Location: [facility];  Service: Endoscopy;  Laterality: Bilateral;    Current Outpatient Medications  Medication Sig Dispense Refill  . allopurinol (ZYLOPRIM) 100  MG tablet TAKE 1 TABLET BY MOUTH EVERY DAY 90 tablet 3  . amLODipine (NORVASC) 10 MG tablet Take 1 tablet (10 mg total) by mouth daily. 30 tablet 1  . amLODIPine-Valsartan-HCTZ 10-320-25 MG TABS TAKE 1 TABLET BY MOUTH EVERY DAY 90 tablet 3  . ASPIRIN LOW DOSE 81 MG EC tablet TAKE 1 TABLET BY MOUTH EVERY DAY 90 tablet 0  . bisoprolol (ZEBETA) 10 MG tablet TAKE 1 TABLET BY MOUTH EVERY DAY 90 tablet 0  . Blood Glucose Monitoring Suppl (ONE TOUCH ULTRA 2) w/Device KIT 1 Device by Does not apply route 2 (two) times daily. 1 each 0  . cholecalciferol (VITAMIN D3) 25 MCG (1000 UNIT) tablet TAKE 1 TABLET BY MOUTH EVERY DAY 90 tablet 3  . cloNIDine (CATAPRES) 0.2 MG tablet TAKE 1  TABLET (0.2 MG TOTAL) BY MOUTH 3 (THREE) TIMES DAILY. 270 tablet 1  . famotidine (PEPCID) 20 MG tablet TAKE 1 TABLET BY MOUTH EVERYDAY AT BEDTIME 90 tablet 3  . gabapentin (NEURONTIN) 100 MG capsule TAKE 1 CAPSULE (100 MG TOTAL) BY MOUTH AT BEDTIME. 90 capsule 3  . insulin degludec (TRESIBA FLEXTOUCH) 200 UNIT/ML FlexTouch Pen Inject 130 Units into the skin daily. 18 pen 2  . Insulin Pen Needle (BD PEN NEEDLE NANO U/F) 32G X 4 MM MISC USE 4X A DAY 200 each 6  . Lancets (ONETOUCH DELICA PLUS MEQAST41D) MISC 100 PENS BY DOES NOT APPLY ROUTE 2 (TWO) TIMES DAILY. 100 each 2  . levothyroxine (SYNTHROID) 25 MCG tablet Take 1 tablet (25 mcg total) by mouth daily before breakfast. 90 tablet 3  . metFORMIN (GLUCOPHAGE) 1000 MG tablet TAKE 1 TABLET (1,000 MG TOTAL) BY MOUTH 2 (TWO) TIMES DAILY WITH A MEAL. 180 tablet 3  . ONETOUCH VERIO test strip CHECKS SUGARS 4 TIMES DAILY, ON MEAL TIME INSULIN 100 strip 6  . OZEMPIC, 1 MG/DOSE, 2 MG/1.5ML SOPN INJECT 1 MG INTO THE SKIN ONCE A WEEK. 1 pen 11  . pantoprazole (PROTONIX) 40 MG tablet Take 1 tablet (40 mg total) by mouth daily. Take 30-60 min before first meal of the day 30 tablet 2  . potassium chloride (KLOR-CON 10) 10 MEQ tablet Take 1 tablet (10 mEq total) by mouth 2 (two) times daily. 180 tablet 3  . rosuvastatin (CRESTOR) 20 MG tablet TAKE 1 TABLET BY MOUTH EVERY DAY 90 tablet 0  . valsartan-hydrochlorothiazide (DIOVAN-HCT) 320-25 MG tablet Take 1 tablet by mouth daily. 30 tablet 1   No current facility-administered medications for this visit.     ALLERGIES: Penicillins  Family History  Problem Relation Age of Onset  . Heart disease Father   . Hypertension Father   . Heart disease Brother   . Cancer Neg Hx   . Stroke Neg Hx   . Diabetes Neg Hx     Social History   Socioeconomic History  . Marital status: Single    Spouse name: Not on file  . Number of children: Not on file  . Years of education: Not on file  . Highest education level:  Not on file  Occupational History  . Not on file  Tobacco Use  . Smoking status: Never Smoker  . Smokeless tobacco: Never Used  Substance and Sexual Activity  . Alcohol use: No  . Drug use: No  . Sexual activity: Not Currently    Partners: Male    Birth control/protection: Post-menopausal, None  Other Topics Concern  . 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 Difficulty of Paying Living Expenses:   Food Insecurity:   . Worried About Charity fundraiser in the Last Year:   . Arboriculturist in the Last Year:   Transportation Needs:   . Film/video editor (Medical):   Marland Kitchen Lack of Transportation (Non-Medical):   Physical Activity:   . Days of Exercise per Week:   . Minutes of Exercise per Session:   Stress:   . Feeling of Stress :   Social Connections:   . Frequency of Communication with Friends and Family:   . Frequency of Social Gatherings with Friends and Family:   . Attends Religious Services:   . Active Member of Clubs or Organizations:   . Attends Archivist Meetings:   Marland Kitchen Marital Status:   Intimate Partner Violence:   . Fear of Current or Ex-Partner:   . Emotionally Abused:   Marland Kitchen Physically Abused:   . Sexually Abused:     Review of Systems  All other systems reviewed and are negative.   PHYSICAL EXAMINATION:    BP 118/68   Pulse 92   Temp (!) 97.2 F (36.2 C)   Ht 5' 7"  (1.702 m)   Wt (!) 311 lb (141.1 kg)   LMP 11/09/2013   SpO2 98%   BMI 48.71 kg/m     General appearance: alert, cooperative and appears stated age Neck: supple, large goiter Heart: regular rate and rhythm Lungs: CTAB Abdomen: obese, soft, non-tender; bowel sounds normal; no masses,  no organomegaly Extremities: normal, atraumatic, no cyanosis Skin: normal color,  texture and turgor, no rashes or lesions Lymph: normal cervical supraclavicular and inguinal nodes Neurologic: grossly normal   Pelvic: External genitalia:  no lesions              Urethra:  normal appearing urethra with no masses, tenderness or lesions              Bartholins and Skenes: normal                 Vagina: normal appearing vagina with normal color and discharge, no lesions              Cervix: no lesions                Sonohysterogram The procedure and risks of the procedure were reviewed with the patient, consent form was signed. A speculum was placed in the vagina and the cervix was cleansed with betadine. The sonohysterogram catheter was inserted into the uterine cavity without difficulty. Saline was infused under direct observation with the ultrasound. 2 endometrial polyps were noted.The catheter was removed.   Chaperone was present for exam.  Ultrasound images were reviewed with the patient. She has 2 myomas, one deviates her cavity posteriorly. 2 endometrial polyps on sonohysterogram. See enclosed report and images.   Recent labs reviewed 05/11/19: glucose 277, creatinine 1.03, GFR 66.94, K 3.3, TSH 4.53, HgbA1C 8.5  ASSESSMENT Postmenopausal bleeding, weakly proliferative endometrium on biopsy, 2 polyps on sonohysterogram Multiple medical issues, including; diabetes, HTN, obesity, OSA, elevated lipids  BMI 48    PLAN Plan: hysteroscopy, polypectomy, dilation and curettage. Reviewed risks, including: bleeding, infection, and uterine perforation She will need pre-operative clearance Discussed importance of good control of her diabetes  ACOG handouts on hysteroscopy, dilation and curettage given  CC: Dorothea Ogle, PA-C         Dr Loanne Drilling  In addition to performing the sonohysterogram and reviewing the ultrasound results with the patient, ~15 minutes was spent in total patient care reviewing management/surgery and medical problems in relation to surgery.

## 2019-05-19 NOTE — Telephone Encounter (Signed)
Per Dr. Cordelia Pen request, called pt to schedule an appt for surgical clearance. LVM requesting returned call.

## 2019-05-25 ENCOUNTER — Telehealth: Payer: Self-pay | Admitting: Obstetrics and Gynecology

## 2019-05-25 NOTE — Telephone Encounter (Signed)
Spoke with patient regarding surgery benefits. Patient acknowledges understanding of information presented. Patient is aware that benefits presented are for professional benefits only. Patient is aware that once surgery is scheduled, the hospital will call with separate benefits. Patient is aware of surgery cancellation policy.  Patient to call back later this week to proceed with scheduling.

## 2019-06-07 ENCOUNTER — Other Ambulatory Visit: Payer: Self-pay | Admitting: Medical

## 2019-06-08 ENCOUNTER — Other Ambulatory Visit: Payer: Self-pay | Admitting: Endocrinology

## 2019-06-08 ENCOUNTER — Ambulatory Visit: Payer: BC Managed Care – PPO | Admitting: Medical

## 2019-06-08 ENCOUNTER — Encounter: Payer: Self-pay | Admitting: Medical

## 2019-06-08 ENCOUNTER — Other Ambulatory Visit: Payer: Self-pay

## 2019-06-08 ENCOUNTER — Telehealth: Payer: Self-pay | Admitting: Medical

## 2019-06-08 VITALS — BP 140/86 | HR 71 | Temp 98.0°F | Ht 67.0 in | Wt 312.6 lb

## 2019-06-08 DIAGNOSIS — Z7189 Other specified counseling: Secondary | ICD-10-CM

## 2019-06-08 DIAGNOSIS — L602 Onychogryphosis: Secondary | ICD-10-CM

## 2019-06-08 DIAGNOSIS — R0989 Other specified symptoms and signs involving the circulatory and respiratory systems: Secondary | ICD-10-CM

## 2019-06-08 DIAGNOSIS — I10 Essential (primary) hypertension: Secondary | ICD-10-CM

## 2019-06-08 DIAGNOSIS — Z9989 Dependence on other enabling machines and devices: Secondary | ICD-10-CM

## 2019-06-08 DIAGNOSIS — G4733 Obstructive sleep apnea (adult) (pediatric): Secondary | ICD-10-CM | POA: Diagnosis not present

## 2019-06-08 DIAGNOSIS — Z7185 Encounter for immunization safety counseling: Secondary | ICD-10-CM

## 2019-06-08 DIAGNOSIS — Z794 Long term (current) use of insulin: Secondary | ICD-10-CM

## 2019-06-08 DIAGNOSIS — E1165 Type 2 diabetes mellitus with hyperglycemia: Secondary | ICD-10-CM

## 2019-06-08 DIAGNOSIS — K760 Fatty (change of) liver, not elsewhere classified: Secondary | ICD-10-CM | POA: Diagnosis not present

## 2019-06-08 DIAGNOSIS — Z0000 Encounter for general adult medical examination without abnormal findings: Secondary | ICD-10-CM | POA: Diagnosis not present

## 2019-06-08 DIAGNOSIS — Z862 Personal history of diseases of the blood and blood-forming organs and certain disorders involving the immune mechanism: Secondary | ICD-10-CM | POA: Insufficient documentation

## 2019-06-08 DIAGNOSIS — R748 Abnormal levels of other serum enzymes: Secondary | ICD-10-CM

## 2019-06-08 DIAGNOSIS — Z1231 Encounter for screening mammogram for malignant neoplasm of breast: Secondary | ICD-10-CM

## 2019-06-08 DIAGNOSIS — R058 Other specified cough: Secondary | ICD-10-CM

## 2019-06-08 DIAGNOSIS — E559 Vitamin D deficiency, unspecified: Secondary | ICD-10-CM

## 2019-06-08 DIAGNOSIS — E049 Nontoxic goiter, unspecified: Secondary | ICD-10-CM

## 2019-06-08 DIAGNOSIS — E785 Hyperlipidemia, unspecified: Secondary | ICD-10-CM

## 2019-06-08 DIAGNOSIS — M1A9XX0 Chronic gout, unspecified, without tophus (tophi): Secondary | ICD-10-CM

## 2019-06-08 DIAGNOSIS — M2540 Effusion, unspecified joint: Secondary | ICD-10-CM

## 2019-06-08 DIAGNOSIS — R05 Cough: Secondary | ICD-10-CM

## 2019-06-08 DIAGNOSIS — E876 Hypokalemia: Secondary | ICD-10-CM | POA: Insufficient documentation

## 2019-06-08 DIAGNOSIS — E040 Nontoxic diffuse goiter: Secondary | ICD-10-CM

## 2019-06-08 MED ORDER — POTASSIUM CHLORIDE 20 MEQ PO PACK: 20.0000 meq | PACK | Freq: Two times a day (BID) | ORAL | 3 refills | Status: AC

## 2019-06-08 NOTE — Telephone Encounter (Signed)
Call to patient. Patient is ready to proceed with scheduling.

## 2019-06-08 NOTE — Telephone Encounter (Signed)
Send my letter, copy of the office note today and labs we will get tomorrow to cardiology Dr. Terrence Dupont  [facility] Dr. Charolette Forward [address], [place], [place] [address] [phone] Fax [phone]

## 2019-06-08 NOTE — Telephone Encounter (Signed)
It looks like her primary was sending a note to her Cardiologist. Please check with that office to see if she needs to be seen prior to surgery.

## 2019-06-08 NOTE — Telephone Encounter (Addendum)
Spoke with patient. Patient would like to proceed with surgery on 06/16/2019. Surgery scheduled for 06/16/2019 at 9:30 am [facility]. Pre op was done at [facility] on 05/19/2019. COVID test scheduled for 06/12/2019 at 10:10 am. Patient is aware of the need to quarantine after test until surgery. 2 week post op scheduled for 07/01/19 at 1:30 pm with Dr.Jertson. Patient is agreeable to all dates and times. Surgery instructions reviewed. Patient verbalizes understanding.   Patient was seen with PCP today for pre op clearance. Will patient need to be seen with Endocrinology as well?

## 2019-06-08 NOTE — Progress Notes (Signed)
Subjective: Chief Complaint  Patient presents with  . Annual Exam    with fasting labs    Here for physical  Medical team Dr. Sumner Boast, gynecology Dr. Juanita Craver, gastroenterology Dr. Renato Shin, endocrinology Prior with Dr. Christinia Gully, pulmonology Dr. Charolette Forward, cardiology Eye doctor No dentist currently Rafal Archuleta, Camelia Eng, PA-C here for primary care  Concerns She sees several specialist  She has a complicated medical history including diabetes, hypertension, hyperlipidemia, morbid obesity, thyroid goiter, history of gout, history of sarcoidosis,, sleep apnea.   She has recently saw gynecology and is having the surgery soon to remove a vaginal polyp.  She just recently had endometrial biopsy as well  Seeing endocrinology regularly for diabetes and recently was started on thyroid medicine  Her potassium was low on recent lab.  She says she is taking her potassium daily  She is not currently seeing a dentist  She has applied for disability and is awaiting determination.  Not currently working.  No new complaints   Past Medical History:  Diagnosis Date  . Back pain   . Diabetes mellitus without complication ([place])   . Goiter    goiter, abnormal labs, treatment initiated 2021, Dr. Loanne Drilling  . H/O echocardiogram    Dr. Charolette Forward, [facility]  . Hyperlipidemia   . Hypertension 2000   managed by Dr. Terrence Dupont, cardiology  . Obesity   . Sarcoid 2018   Dr. Melvyn Novas prior consults.   No active disease as of 05/2018  . Sleep apnea    on CPAP setting of 2    Past Surgical History:  Procedure Laterality Date  . COLONOSCOPY WITH PROPOFOL N/A 09/21/2013   Procedure: COLONOSCOPY WITH PROPOFOL;  Surgeon: Juanita Craver, MD;  Location: [facility];  Service: Endoscopy;  Laterality: N/A;  . CYST EXCISION     right low back  . ENDOMETRIAL BIOPSY  2021   Dr. Sumner Boast  . TUBAL LIGATION    . VIDEO BRONCHOSCOPY Bilateral 07/05/2016   Procedure:  VIDEO BRONCHOSCOPY WITH FLUORO;  Surgeon: Tanda Rockers, MD;  Location: [facility];  Service: Endoscopy;  Laterality: Bilateral;    Social History   Socioeconomic History  . Marital status: Single    Spouse name: Not on file  . Number of children: Not on file  . Years of education: Not on file  . Highest education level: Not on file  Occupational History  . Not on file  Tobacco Use  . Smoking status: Never Smoker  . Smokeless tobacco: Never Used  Substance and Sexual Activity  . Alcohol use: No  . Drug use: No  . Sexual activity: Not Currently    Partners: Male    Birth control/protection: Post-menopausal, None  Other Topics Concern  . Not on file  Social History Narrative   Lives with sister [name], single, has 3 sons.   Doing some walking for exercise.  Prior Consulting civil engineer at [employer], visit different churches.  Awaiting disability determination.   06/2019.     Social Determinants of Health   Financial Resource Strain:   . Difficulty of Paying Living Expenses:   Food Insecurity:   . Worried About Charity fundraiser in the Last Year:   . Arboriculturist in the Last Year:   Transportation Needs:   . Film/video editor (Medical):   Marland Kitchen Lack of Transportation (Non-Medical):   Physical Activity:   . Days of Exercise per Week:   . Minutes of  Exercise per Session:   Stress:   . Feeling of Stress :   Social Connections:   . Frequency of Communication with Friends and Family:   . Frequency of Social Gatherings with Friends and Family:   . Attends Religious Services:   . Active Member of Clubs or Organizations:   . Attends Archivist Meetings:   Marland Kitchen Marital Status:   Intimate Partner Violence:   . Fear of Current or Ex-Partner:   . Emotionally Abused:   Marland Kitchen Physically Abused:   . Sexually Abused:     Family History  Problem Relation Age of Onset  . Heart disease Father   . Hypertension Father   . Heart disease Brother   . Cancer Neg Hx    . Stroke Neg Hx   . Diabetes Neg Hx      Current Outpatient Medications:  .  allopurinol (ZYLOPRIM) 100 MG tablet, TAKE 1 TABLET BY MOUTH EVERY DAY, Disp: 90 tablet, Rfl: 3 .  amLODipine (NORVASC) 10 MG tablet, Take 1 tablet (10 mg total) by mouth daily., Disp: 30 tablet, Rfl: 1 .  amLODIPine-Valsartan-HCTZ 10-320-25 MG TABS, TAKE 1 TABLET BY MOUTH EVERY DAY, Disp: 90 tablet, Rfl: 3 .  ASPIRIN LOW DOSE 81 MG EC tablet, TAKE 1 TABLET BY MOUTH EVERY DAY, Disp: 90 tablet, Rfl: 0 .  bisoprolol (ZEBETA) 10 MG tablet, TAKE 1 TABLET BY MOUTH EVERY DAY, Disp: 90 tablet, Rfl: 0 .  Blood Glucose Monitoring Suppl (ONE TOUCH ULTRA 2) w/Device KIT, 1 Device by Does not apply route 2 (two) times daily., Disp: 1 each, Rfl: 0 .  cholecalciferol (VITAMIN D3) 25 MCG (1000 UNIT) tablet, TAKE 1 TABLET BY MOUTH EVERY DAY, Disp: 90 tablet, Rfl: 3 .  cloNIDine (CATAPRES) 0.2 MG tablet, TAKE 1 TABLET (0.2 MG TOTAL) BY MOUTH 3 (THREE) TIMES DAILY., Disp: 270 tablet, Rfl: 1 .  famotidine (PEPCID) 20 MG tablet, TAKE 1 TABLET BY MOUTH EVERYDAY AT BEDTIME, Disp: 90 tablet, Rfl: 3 .  gabapentin (NEURONTIN) 100 MG capsule, TAKE 1 CAPSULE (100 MG TOTAL) BY MOUTH AT BEDTIME., Disp: 90 capsule, Rfl: 3 .  insulin degludec (TRESIBA FLEXTOUCH) 200 UNIT/ML FlexTouch Pen, Inject 130 Units into the skin daily., Disp: 18 pen, Rfl: 2 .  Insulin Pen Needle (BD PEN NEEDLE NANO U/F) 32G X 4 MM MISC, USE 4X A DAY, Disp: 200 each, Rfl: 6 .  Lancets (ONETOUCH DELICA PLUS [id number]) MISC, 100 PENS BY DOES NOT APPLY ROUTE 2 (TWO) TIMES DAILY., Disp: 100 each, Rfl: 2 .  levothyroxine (SYNTHROID) 25 MCG tablet, Take 1 tablet (25 mcg total) by mouth daily before breakfast., Disp: 90 tablet, Rfl: 3 .  metFORMIN (GLUCOPHAGE) 1000 MG tablet, TAKE 1 TABLET (1,000 MG TOTAL) BY MOUTH 2 (TWO) TIMES DAILY WITH A MEAL., Disp: 180 tablet, Rfl: 3 .  ONETOUCH VERIO test strip, CHECKS SUGARS 4 TIMES DAILY, ON MEAL TIME INSULIN, Disp: 100 strip, Rfl: 6 .   OZEMPIC, 1 MG/DOSE, 2 MG/1.5ML SOPN, INJECT 1 MG INTO THE SKIN ONCE A WEEK., Disp: 1 pen, Rfl: 11 .  pantoprazole (PROTONIX) 40 MG tablet, Take 1 tablet (40 mg total) by mouth daily. Take 30-60 min before first meal of the day, Disp: 30 tablet, Rfl: 2 .  potassium chloride (KLOR-CON 10) 10 MEQ tablet, Take 1 tablet (10 mEq total) by mouth 2 (two) times daily., Disp: 180 tablet, Rfl: 3 .  rosuvastatin (CRESTOR) 20 MG tablet, TAKE 1 TABLET BY MOUTH EVERY DAY, Disp: 90 tablet,  Rfl: 0 .  valsartan-hydrochlorothiazide (DIOVAN-HCT) 320-25 MG tablet, Take 1 tablet by mouth daily., Disp: 30 tablet, Rfl: 1 .  potassium chloride (KLOR-CON) 20 MEQ packet, Take 20 mEq by mouth 2 (two) times daily., Disp: 180 each, Rfl: 3  Allergies  Allergen Reactions  . Penicillins Hives and Swelling    Has patient had a PCN reaction causing immediate rash, facial/tongue/throat swelling, SOB or lightheadedness with hypotension: Yes Has patient had a PCN reaction causing severe rash involving mucus membranes or skin necrosis: No Has patient had a PCN reaction that required hospitalization: No Has patient had a PCN reaction occurring within the last 10 years: No If all of the above answers are "NO", then may proceed with Cephalosporin use.    Objective: BP 140/86   Pulse 71   Temp 98 F (36.7 C)   Ht 5' 7"  (1.702 m)   Wt (!) 312 lb 9.6 oz (141.8 kg)   LMP 11/09/2013   SpO2 94%   BMI 48.96 kg/m   Wt Readings from Last 3 Encounters:  06/08/19 (!) 312 lb 9.6 oz (141.8 kg)  05/19/19 (!) 311 lb (141.1 kg)  05/11/19 (!) 320 lb (145.2 kg)    BP Readings from Last 3 Encounters:  06/08/19 140/86  05/19/19 118/68  05/11/19 (!) 154/88   General appearence: alert, no distress, WD/WN, obese African American female Skin: warm, dry, no specific worrisome lesions Neck: supple, no lymphadenopathy, generalized large goiter, no masses Heart: RRR, normal S1, S2, no murmurs Lungs: CTA bilaterally, no wheezes, rhonchi, or  rales Abdomen: +bs, soft, non tender, non distended, no masses, no hepatomegaly, no splenomegaly Pulses: 2+ symmetric, upper and 1+ bilat lower extremities, normal cap refill Ext: no edema Neuro: CN II through XII intact, DTRs 1+ in general, otherwise nonfocal exam Breast and pelvic exam deferred to gynecology  Diabetic Foot Exam - Simple   Simple Foot Form Diabetic Foot exam was performed with the following findings: Yes 06/08/2019  9:03 AM  Visual Inspection See comments: Yes Sensation Testing Intact to touch and monofilament testing bilaterally: Yes Pulse Check See comments: Yes Comments 1+ pedal pulses, toenails somewhat thickened, no other foot lesions         Assessment: Encounter Diagnoses  Name Primary?  . Encounter for health maintenance examination in adult Yes  . Essential hypertension, benign   . OSA on CPAP   . Fatty liver   . Type 2 diabetes mellitus with hyperglycemia, with long-term current use of insulin ([place])   . Diffuse goiter   . Morbid obesity due to excess calories ([place])   . Hyperlipidemia, unspecified hyperlipidemia type   . Vitamin D deficiency   . History of sarcoidosis   . Vaccine counseling   . Upper airway cough syndrome   . Decreased pedal pulses   . Chronic gout without tophus, unspecified cause, unspecified site   . Joint swelling   . Elevated alkaline phosphatase level   . Nail hypertrophy   . Encounter for screening mammogram for malignant neoplasm of breast   . Hypokalemia      Plan: Physical exam - discussed and counseled on healthy lifestyle, diet, exercise, preventative care, vaccinations, sick and well care, proper use of emergency dept and after hours care, and addressed their concerns.    Health screening: See your eye doctor yearly for routine vision care. See your dentist yearly for routine dental care including hygiene visits twice yearly.  I reviewed her recent notes from endocrinology from April 2021, cardiology  March  2021, gynecology April 2021.  She had labs done recently endocrinology and cardiology which were reviewed.  I avoid duplication of labs since some of this was recently done.  Additional labs as below today  Recent labs through cardiology 04/10/2019 shows glucose 155, elevated, BUN 17, creatinine 1.07 slightly elevated, hemoglobin A1c 8.4% elevated, alkaline phos, AST, ALT normal, triglycerides 180 elevated, LDL 78, HDL 38, total 144, ratio 3.8, TSH 4.46, recent EKG showing normal sinus rhythm, left atrial enlargement, otherwise normal without acute change  Chronic Issues:  Obesity I strongly recommend weight loss through healthy diet, regular exercise, and we need to consider other strategies such as nutritionist consult, weight management clinic or other.  Let me know if you would like to be referred to weight management clinic or bariatric surgery clinic for consult?   Fatty liver disease  The main treatment for this is weight loss through healthy low-fat diet and exercise  Complications of fatty liver disease can include fibrosis and end-stage liver disease which can lead to bleeding problems, fluid overload, and liver failure   Decreased pulses in the feet Due to decreased pulses in the feet and history of diabetes, it is recommended you have a special blood flow screen called ABI in your legs periodically.  I recommend we set this up.  This is to screen for vascular disease.   History of sarcoidosis (prior management by Dr. Melvyn Novas) I reviewed the April 2020 office note with Dr. Melvyn Novas, and his notes state that you had no active disease at that time.  Thus sarcoidosis seems to be in remission   Obstructive sleep apnea-continue CPAP therapy   Diabetes, thyroid goiter (managed by Dr. Loanne Drilling) Continue routine follow-up with Dr. Loanne Drilling and medications per his orders.  I reviewed your recent labs and office notes from Dr. Loanne Drilling   Hypertension (managed by Dr. Rosebud Poles, but future  refills should probably be through cardiology Dr. Terrence Dupont to avoid any confusion/duplications)  I called today to request updated labs and records from Dr. Terrence Dupont.   He is not in our electronic record system so I have to await records  I need to verify with your pharmacy and from Dr. Zenia Resides records what blood pressure medicines you are supposed to be taking.  We want to avoid duplications in medications .   At this point your cardiologist should be prescribing your blood pressure medicines since he is controlling this aspect of your care.  I believe the last few refills have been sent to [place] from your pharmacy  Lets make sure we are in sync with cardiology on this issue  Recently your pharmacy send [place] a note that the combination pill amlodipine valsartan HCT was on backorder.  This we sent separate prescriptions for Amlodipine and separate prescription for Valsartan HCT 320/87m daily.  Recent blood pressure reading in cardiology was 136/84 with noted normal home blood pressure readings  Your current BP medications are as follows:  Bisoprolol 1946m 1 tablet daily  Clonidine 0.46m37mthree times daily  Valsartan HCT 320/33m34m tablet daily  Amlodipine 10mg50mtablet daily    High cholesterol Continue Crestor daily at bedtime to lower risk of heart disease   Hypokalemia Your potassium was low on recent labs.  I am increasing your Klor-Con potassium to 20 mEq twice daily instead of 10meq29mce daily.      Vitamin D deficiency Continue your vitamin D supplement, get fish in your diet, take a walk out in the sun several  days per week for help with vitamin D absorption.   History of gout I am checking uric acid lab today as this is related to gout.   Continue Allopurinol for prevention of gout flare ups.   Neuropathy  Continue Gabapentin/Neurontin to help control symptoms of neuropathy     Cancer screening  Colon cancer screening Your last colonoscopy was 09/2013 with Dr.  Mickel Duhamel, normal. We will send you home with stool cards for intermediate screen  Breast cancer screening You are due for mammogram.  The last mammogram I see on file was 2018.  I reviewed your Pap smear from March 2021 with Dr. Talbert Nan.  You are up-to-date on this.  You also recently had an endometrial biopsy.  Follow-up with Dr. Talbert Nan as advised    Vaccinations I recommend yearly flu shot  I recommended Covid vaccine  You are up-to-date on pneumonia 23 vaccine, tetanus vaccine, and you had a flu shot last year  Shingles vaccine:  I recommend you have a shingles vaccine to help prevent shingles or herpes zoster outbreak.   Please call your insurer to inquire about coverage for the Shingrix vaccine given in 2 doses.   Some insurers cover this vaccine after age 63, some cover this after age 18.  If your insurer covers this, then call to schedule appointment to have this vaccine here.  With diabetes it is recommended you have a hepatitis B vaccine if you are not immune.  We are checking your immunity today   Gynecology note shows need for clearance for upcoming surgery.  I will send a letter to Dr. Terrence Dupont about this from a cardiology perspective  Calianna was seen today for annual exam.  Diagnoses and all orders for this visit:  Encounter for health maintenance examination in adult -     Hepatitis B surface antibody,quantitative -     CBC -     Cancel: Lipid panel -     Microalbumin/Creatinine Ratio, Urine -     Cancel: Hepatic function panel -     Sedimentation rate -     Uric acid -     MM DIGITAL SCREENING BILATERAL; Future -     VAS [place] ABI WITH/WO TBI; Future  Essential hypertension, benign  OSA on CPAP  Fatty liver -     Cancel: Hepatic function panel  Type 2 diabetes mellitus with hyperglycemia, with long-term current use of insulin ([facility]) -     Hepatitis B surface antibody,quantitative -     Microalbumin/Creatinine Ratio, Urine -     VAS [place] ABI WITH/WO TBI;  Future  Diffuse goiter  Morbid obesity due to excess calories ([facility])  Hyperlipidemia, unspecified hyperlipidemia type -     Cancel: Lipid panel  Vitamin D deficiency  History of sarcoidosis  Vaccine counseling -     Hepatitis B surface antibody,quantitative  Upper airway cough syndrome  Decreased pedal pulses -     VAS [place] ABI WITH/WO TBI; Future  Chronic gout without tophus, unspecified cause, unspecified site -     Uric acid  Joint swelling -     Sedimentation rate  Elevated alkaline phosphatase level  Nail hypertrophy  Encounter for screening mammogram for malignant neoplasm of breast -     MM DIGITAL SCREENING BILATERAL; Future  Hypokalemia  Other orders -     potassium chloride (KLOR-CON) 20 MEQ packet; Take 20 mEq by mouth 2 (two) times daily.  Greater than 1 hour spent in face to face in  review of her records, recent specialist visits, several labs.  F/u pending labs, yearly for physical

## 2019-06-08 NOTE — Telephone Encounter (Signed)
Done

## 2019-06-08 NOTE — Patient Instructions (Signed)
Health screening: See your eye doctor yearly for routine vision care. See your dentist yearly for routine dental care including hygiene visits twice yearly.      Chronic Issues:  Obesity I strongly recommend weight loss through healthy diet, regular exercise, and we need to consider other strategies such as nutritionist consult, weight management clinic or other.  Let me know if you would like to be referred to weight management clinic or bariatric surgery clinic for consult?   Fatty liver disease  The main treatment for this is weight loss through healthy low-fat diet and exercise  Complications of fatty liver disease can include fibrosis and end-stage liver disease which can lead to bleeding problems, fluid overload, and liver failure   Decreased pulses in the feet Due to decreased pulses in the feet and history of diabetes, it is recommended you have a special blood flow screen called ABI in your legs periodically.  I recommend we set this up.  This is to screen for vascular disease.   History of sarcoidosis I reviewed the April 2020 office note with Dr. Melvyn Novas, and his notes state that you had no active disease at that time.  Thus sarcoidosis seems to be in remission   Obstructive sleep apnea-continue CPAP therapy   Diabetes, thyroid goiter Continue routine follow-up with Dr. Loanne Drilling and medications per his orders.  I reviewed your recent labs and office notes from Dr. Loanne Drilling   Hypertension  I called today to request updated labs and records from Dr. Terrence Dupont.   He is not in our electronic record system so I have to await records  I need to verify with your pharmacy and from Dr. Zenia Resides records what blood pressure medicines you are supposed to be taking.  We want to avoid duplications in medications .   At this point your cardiologist should be prescribing your blood pressure medicines since he is controlling this aspect of your care.  I believe the last few refills have  been sent to [place] from your pharmacy  Lets make sure we are in sync with cardiology on this issue  Recently your pharmacy send [place] a note that the combination pill amlodipine valsartan HCT was on backorder.  This we sent separate prescriptions for Amlodipine and separate prescription for Valsartan HCT 320/25mg  daily.    Your current BP medications are as follows:  Bisoprolol 10mg , 1 tablet daily  Clonidine 0.2mg , three times daily  Valsartan HCT 320/25mg , 1 tablet daily  Amlodipine 10mg , 1 tablet daily    High cholesterol Continue Crestor daily at bedtime to lower risk of heart disease   Hypokalemia Your potassium was low on recent labs.  I am increasing your Klor-Con potassium to 20 mEq twice daily instead of 34meq twice daily.      Vitamin D deficiency Continue your vitamin D supplement, get fish in your diet, take a walk out in the sun several days per week for help with vitamin D absorption.   History of gout I am checking uric acid lab today as this is related to gout.   Continue Allopurinol for prevention of gout flare ups.   Neuropathy  Continue Gabapentin/Neurontin to help control symptoms of neuropathy       Cancer screening  Colon cancer screening Your last colonoscopy was 09/2013 with Dr. Mickel Duhamel, normal. We will send you home with stool cards for intermediate screen  Breast cancer screening You are due for mammogram.  The last mammogram I see on file was 2018.  I reviewed your Pap smear from March 2021 with Dr. Talbert Nan.  You are up-to-date on this.  You also recently had an endometrial biopsy.  Follow-up with Dr. Talbert Nan as advised    Vaccinations I recommend yearly flu shot  I recommended Covid vaccine  You are up-to-date on pneumonia 23 vaccine, tetanus vaccine, and you had a flu shot last year  Shingles vaccine:  I recommend you have a shingles vaccine to help prevent shingles or herpes zoster outbreak.   Please call your insurer to inquire  about coverage for the Shingrix vaccine given in 2 doses.   Some insurers cover this vaccine after age 21, some cover this after age 100.  If your insurer covers this, then call to schedule appointment to have this vaccine here.  With diabetes it is recommended you have a hepatitis B vaccine if you are not immune.  We are checking your immunity today      Ankle-Brachial Index Test Why am I having this test? The ankle-brachial index (ABI) test is used to diagnose peripheral vascular disease (PVD). PVD is also known as peripheral arterial disease (PAD). PVD is the blocking or hardening of the arteries anywhere within the circulatory system beyond the heart. PVD is caused by:  Cholesterol deposits in your blood vessels (atherosclerosis). This is the most common cause of this condition.  Irritation and swelling (inflammation) in the blood vessels.  Blood clots in the vessels. Cholesterol deposits cause arteries to narrow. Normal delivery of oxygen to your tissues is affected, causing muscle pain and fatigue. This is called claudication. PVD means that there may also be a buildup of cholesterol:  In your heart. This increases the risk of heart attacks.  In your brain. This increases the risk of strokes. What is being tested? The ankle-brachial index test measures the blood flow in your arms and legs. The blood flow will show if blood vessels in your legs have been narrowed by cholesterol deposits. How do I prepare for this test?  Wear loose clothing.  Do not use any tobacco products, including cigarettes, chewing tobacco, or e-cigarettes, for at least 30 minutes before the test. What happens during the test?  1. You will lie down in a resting position. 2. Your health care provider will use a blood pressure machine and a small ultrasound device (Doppler) to measure the systolic pressures on your upper arms and ankles. Systolic pressure is the pressure inside your arteries when your heart  pumps. 3. Systolic pressure measurements will be taken several times, and at several points, on both the ankle and the arm. 4. Your health care provider will divide the highest systolic pressure of the ankle by the highest systolic pressure of the arm. The result is the ankle-brachial pressure ratio, or ABI. Sometimes this test will be repeated after you have exercised on a treadmill for 5 minutes. You may have leg pain during the exercise portion of the test if you suffer from PVD. If the index number drops after exercise, this may show that PVD is present. How are the results reported? Your test results will be reported as a value that shows the ratio of your ankle pressure to your arm pressure (ABI ratio). Your health care provider will compare your results to normal ranges that were established after testing a large group of people (reference ranges). Reference ranges may vary among labs and hospitals. For this test, a common reference range is:  ABI ratio of 0.9 to 1.3. What do the  results mean? An ABI ratio that is below the reference range is considered abnormal and may indicate PVD in the legs. Talk with your health care provider about what your results mean. Questions to ask your health care provider Ask your health care provider, or the department that is doing the test:  When will my results be ready?  How will I get my results?  What are my treatment options?  What other tests do I need?  What are my next steps? Summary  The ankle-brachial index (ABI) test is used to diagnose peripheral vascular disease (PVD). PVD is also known as peripheral arterial disease (PAD).  The ankle-brachial index test measures the blood flow in your arms and legs.  The highest systolic pressure of the ankle is divided by the highest systolic pressure of the arm. The result is the ABI ratio.  An ABI ratio that is below 0.9 is considered abnormal and may indicate PVD in the legs. This information  is not intended to replace advice given to you by your health care provider. Make sure you discuss any questions you have with your health care provider. Document Revised: 11/14/2017 Document Reviewed: 10/16/2016 Elsevier Patient Education  2020 [employer].

## 2019-06-09 LAB — HEPATITIS B SURFACE ANTIBODY, QUANTITATIVE: Hepatitis B Surf Ab Quant: <3.1 m[IU]/mL — ABNORMAL LOW (ref >9.9)

## 2019-06-09 LAB — MICROALBUMIN / CREATININE URINE RATIO
Creatinine, Urine: 163.6 mg/dL
Microalb/Creat Ratio: 5 mg/g creat (ref 0–29)
Microalbumin, Urine: 8.9 ug/mL

## 2019-06-09 LAB — CBC
Hematocrit: 40.9 % (ref 34.0–46.6)
Hemoglobin: 13.4 g/dL (ref 11.1–15.9)
MCH: 29.5 pg (ref 26.6–33.0)
MCHC: 32.8 g/dL (ref 31.5–35.7)
MCV: 90 fL (ref 79–97)
Platelets: 215 10*3/uL (ref 150–450)
RBC: 4.54 x10E6/uL (ref 3.77–5.28)
RDW: 13.1 % (ref 11.7–15.4)
WBC: 8.3 10*3/uL (ref 3.4–10.8)

## 2019-06-09 LAB — SEDIMENTATION RATE: Sed Rate: 29 mm/hr (ref 0–40)

## 2019-06-09 LAB — URIC ACID: Uric Acid: 8.4 mg/dL — ABNORMAL HIGH (ref 3.0–7.2)

## 2019-06-10 ENCOUNTER — Other Ambulatory Visit: Payer: Self-pay

## 2019-06-10 ENCOUNTER — Other Ambulatory Visit: Payer: Self-pay | Admitting: Medical

## 2019-06-10 ENCOUNTER — Telehealth: Payer: Self-pay | Admitting: Medical

## 2019-06-10 DIAGNOSIS — R0989 Other specified symptoms and signs involving the circulatory and respiratory systems: Secondary | ICD-10-CM

## 2019-06-10 MED ORDER — ALLOPURINOL 300 MG PO TABS
300.0000 mg | ORAL_TABLET | Freq: Every day | ORAL | 3 refills | Status: DC
Start: 2019-06-10 — End: 2020-06-22

## 2019-06-10 NOTE — Telephone Encounter (Signed)
Dr. Talbert Nan  See my recent office note with her.    Per Dr. Terrence Dupont who mailed me back note today, she is acceptable risk for vaginal surgery from cardiac point of view.  Of note, she has had recent thyroid medication added and I increased her allopurinol.   She sees endocrinology.     From a primary care standpoint she can move forward with surgery.  Dorothea Ogle, PA-C

## 2019-06-10 NOTE — Telephone Encounter (Signed)
[name], Thank you so much for your help! Amy Rosario

## 2019-06-11 ENCOUNTER — Encounter (HOSPITAL_BASED_OUTPATIENT_CLINIC_OR_DEPARTMENT_OTHER): Payer: Self-pay | Admitting: Obstetrics and Gynecology

## 2019-06-11 ENCOUNTER — Other Ambulatory Visit: Payer: Self-pay

## 2019-06-11 NOTE — Progress Notes (Signed)
Spoke w/ via phone for pre-op interview--- PT Lab needs dos----  Istat and EKG             Lab results------ no COVID test ------ 06-12-2019 @ 1010 Arrive at ------- 0730 NPO after ------ [place] Medications to take morning of surgery ----- Crestor, Clonidine, Bisoprolol, Norvase, Allopurinol, Synthroid, and Protonix w/ sips of water Diabetic medication ----- do not take metformin and do not do you tresiba insulin Patient Special Instructions ----- n/a Pre-Op special Istructions ----- requested and received via fax form dr Sofie Rower note, echo, and last EKG ;  With chart note  Patient verbalized understanding of instructions that were given at this phone interview. Patient denies shortness of breath, chest pain, fever, cough a this phone interview.   Anesthesia Review:  Chart to be reviewed by Konrad Felix PA  PCP:  Chana Bode PA (lov 06-08-2019 epic) Cardiologist :  Dr Lyla Son Cassell Clement 04-10-2019 with chart) Chest x-ray : 04-02-2018 epic EKG :  Last one received from dr Lyla Son dated 04-04-2018 w/ chart/  In epic 03-26-2016 Echo :  04-10-2018 received from dr Lyla Son w/ chart Stress test:  no Cardiac Cath :  no Sleep Study/ CPAP : YES/ YES Fasting Blood Sugar :  85 - 130    / Checks Blood Sugar -- times a day:  Checks daily in am Blood Thinner/ Instructions /Last Dose: NO ASA / Instructions/ Last Dose : ASA 81mg /  Pt stated given instructions from Dr Talbert Nan last dose to be 06-14-2019

## 2019-06-11 NOTE — Telephone Encounter (Signed)
Call to Dr.Harwani's office. Dr.Harwani has cleared the patient for surgery and sent a note back to PCP. Call to Dr.Tysinger's office who are closed at this time. Need to get a copy of the surgery clearance.

## 2019-06-12 ENCOUNTER — Ambulatory Visit (HOSPITAL_COMMUNITY)
Admission: RE | Admit: 2019-06-12 | Discharge: 2019-06-12 | Disposition: A | Discharge status: Home / Self Care | Payer: BC Managed Care – PPO | Source: Ambulatory Visit | Attending: Medical | Admitting: Medical

## 2019-06-12 ENCOUNTER — Other Ambulatory Visit (HOSPITAL_COMMUNITY)
Admission: RE | Admit: 2019-06-12 | Discharge: 2019-06-12 | Disposition: A | Discharge status: Home / Self Care | Payer: BC Managed Care – PPO | Source: Ambulatory Visit | Attending: Obstetrics and Gynecology | Admitting: Obstetrics and Gynecology

## 2019-06-12 DIAGNOSIS — Z20822 Contact with and (suspected) exposure to covid-19: Secondary | ICD-10-CM | POA: Diagnosis not present

## 2019-06-12 DIAGNOSIS — Z0000 Encounter for general adult medical examination without abnormal findings: Secondary | ICD-10-CM | POA: Diagnosis not present

## 2019-06-12 DIAGNOSIS — R0989 Other specified symptoms and signs involving the circulatory and respiratory systems: Secondary | ICD-10-CM | POA: Insufficient documentation

## 2019-06-12 DIAGNOSIS — E1165 Type 2 diabetes mellitus with hyperglycemia: Secondary | ICD-10-CM | POA: Diagnosis not present

## 2019-06-12 DIAGNOSIS — Z794 Long term (current) use of insulin: Secondary | ICD-10-CM | POA: Insufficient documentation

## 2019-06-12 DIAGNOSIS — Z01818 Encounter for other preprocedural examination: Secondary | ICD-10-CM | POA: Diagnosis not present

## 2019-06-12 LAB — SARS CORONAVIRUS 2 (TAT 6-24 HRS): SARS Coronavirus 2: NEGATIVE

## 2019-06-12 NOTE — Progress Notes (Signed)
ABI completed. Refer to "CV Proc" under chart review to view preliminary results.  06/12/2019 2:41 PM Kelby Aline., MHA, RVT, RDCS, RDMS

## 2019-06-12 NOTE — Progress Notes (Signed)
Spoke with Janett Billow zanetto pa ok to proceed.

## 2019-06-15 ENCOUNTER — Encounter: Payer: Self-pay | Admitting: Medical

## 2019-06-15 ENCOUNTER — Telehealth: Payer: Self-pay

## 2019-06-15 NOTE — Telephone Encounter (Signed)
Cardiology clearance to Dr.Jertson.  Anything additional needed for this patient prior to surgery?

## 2019-06-15 NOTE — Telephone Encounter (Signed)
Call to Dr.Tysinger's office. They are going to send surgery clearance information from Cardiology to our office.

## 2019-06-15 NOTE — Anesthesia Preprocedure Evaluation (Addendum)
Anesthesia Evaluation  Patient identified by MRN, date of birth, ID band Patient awake    Reviewed: Allergy & Precautions, NPO status , Patient's Chart, lab work & pertinent test results  History of Anesthesia Complications Negative for: history of anesthetic complications  Airway Mallampati: II  TM Distance: >3 FB Neck ROM: Full    Dental  (+) Dental Advisory Given, Chipped, Missing, Poor Dentition,    Pulmonary sleep apnea and Continuous Positive Airway Pressure Ventilation ,   Sarcoidosis in hx, though bronchoscopy in 2018 showed no evidence of such    Pulmonary exam normal        Cardiovascular hypertension, Pt. on medications and Pt. on home beta blockers Normal cardiovascular exam     Neuro/Psych  Neuromuscular disease (neuropathy) negative psych ROS   GI/Hepatic Neg liver ROS, GERD  Medicated and Controlled,  Endo/Other  diabetes, Type 2, Insulin DependentHypothyroidism Morbid obesity  Renal/GU negative Renal ROS     Musculoskeletal  Gout    Abdominal (+) + obese,   Peds  Hematology negative hematology ROS (+)   Anesthesia Other Findings Covid neg 5/7  Reproductive/Obstetrics                            Anesthesia Physical Anesthesia Plan  ASA: III  Anesthesia Plan: General   Post-op Pain Management:    Induction: Intravenous  PONV Risk Score and Plan: 3 and Treatment may vary due to age or medical condition, Ondansetron and Dexamethasone  Airway Management Planned: LMA  Additional Equipment: None  Intra-op Plan:   Post-operative Plan: Extubation in OR  Informed Consent: I have reviewed the patients History and Physical, chart, labs and discussed the procedure including the risks, benefits and alternatives for the proposed anesthesia with the patient or authorized representative who has indicated his/her understanding and acceptance.     Dental advisory  given  Plan Discussed with: CRNA and Anesthesiologist  Anesthesia Plan Comments:        Anesthesia Quick Evaluation

## 2019-06-15 NOTE — Telephone Encounter (Signed)
Surgical clearance from cardiology has been faxed to the fax number provided.

## 2019-06-15 NOTE — Telephone Encounter (Signed)
No, we are all set. Thank you.

## 2019-06-15 NOTE — Telephone Encounter (Signed)
Nunzio Cory from [employer] called wanted to know if there was any way we could fax a surgical clearance note that we received from the pts. Cardiologists office, the fax number is [phone] if you have any questions you could call her back.

## 2019-06-16 ENCOUNTER — Encounter (HOSPITAL_BASED_OUTPATIENT_CLINIC_OR_DEPARTMENT_OTHER)
Admission: RE | Disposition: A | Discharge status: Home / Self Care | Payer: Self-pay | Source: Home / Self Care | Attending: Obstetrics and Gynecology

## 2019-06-16 ENCOUNTER — Encounter (HOSPITAL_BASED_OUTPATIENT_CLINIC_OR_DEPARTMENT_OTHER): Payer: Self-pay | Admitting: Obstetrics and Gynecology

## 2019-06-16 ENCOUNTER — Other Ambulatory Visit: Payer: Self-pay

## 2019-06-16 ENCOUNTER — Ambulatory Visit (HOSPITAL_BASED_OUTPATIENT_CLINIC_OR_DEPARTMENT_OTHER): Payer: BC Managed Care – PPO | Admitting: Physician Assistant

## 2019-06-16 ENCOUNTER — Ambulatory Visit (HOSPITAL_BASED_OUTPATIENT_CLINIC_OR_DEPARTMENT_OTHER)
Admission: RE | Admit: 2019-06-16 | Discharge: 2019-06-16 | Disposition: A | Discharge status: Home / Self Care | Payer: BC Managed Care – PPO | Attending: Obstetrics and Gynecology | Admitting: Obstetrics and Gynecology

## 2019-06-16 DIAGNOSIS — E119 Type 2 diabetes mellitus without complications: Secondary | ICD-10-CM | POA: Diagnosis not present

## 2019-06-16 DIAGNOSIS — N840 Polyp of corpus uteri: Secondary | ICD-10-CM | POA: Diagnosis not present

## 2019-06-16 DIAGNOSIS — K219 Gastro-esophageal reflux disease without esophagitis: Secondary | ICD-10-CM | POA: Insufficient documentation

## 2019-06-16 DIAGNOSIS — I10 Essential (primary) hypertension: Secondary | ICD-10-CM | POA: Diagnosis not present

## 2019-06-16 DIAGNOSIS — E785 Hyperlipidemia, unspecified: Secondary | ICD-10-CM | POA: Diagnosis not present

## 2019-06-16 DIAGNOSIS — Z79899 Other long term (current) drug therapy: Secondary | ICD-10-CM | POA: Insufficient documentation

## 2019-06-16 DIAGNOSIS — Z7982 Long term (current) use of aspirin: Secondary | ICD-10-CM | POA: Insufficient documentation

## 2019-06-16 DIAGNOSIS — G4733 Obstructive sleep apnea (adult) (pediatric): Secondary | ICD-10-CM | POA: Insufficient documentation

## 2019-06-16 DIAGNOSIS — Z880 Allergy status to penicillin: Secondary | ICD-10-CM | POA: Diagnosis not present

## 2019-06-16 DIAGNOSIS — N950 Postmenopausal bleeding: Secondary | ICD-10-CM | POA: Insufficient documentation

## 2019-06-16 DIAGNOSIS — M109 Gout, unspecified: Secondary | ICD-10-CM | POA: Insufficient documentation

## 2019-06-16 DIAGNOSIS — Z6841 Body Mass Index (BMI) 40.0 and over, adult: Secondary | ICD-10-CM | POA: Insufficient documentation

## 2019-06-16 DIAGNOSIS — Z79890 Hormone replacement therapy (postmenopausal): Secondary | ICD-10-CM | POA: Diagnosis not present

## 2019-06-16 DIAGNOSIS — Z794 Long term (current) use of insulin: Secondary | ICD-10-CM | POA: Insufficient documentation

## 2019-06-16 HISTORY — DX: Type 2 diabetes mellitus without complications: E11.9

## 2019-06-16 HISTORY — DX: Chronic gout, unspecified, without tophus (tophi): M1A.9XX0

## 2019-06-16 HISTORY — PX: DILATATION & CURETTAGE/HYSTEROSCOPY WITH MYOSURE: SHX6511

## 2019-06-16 HISTORY — DX: Polyneuropathy, unspecified: G62.9

## 2019-06-16 HISTORY — DX: Nontoxic goiter, unspecified: E04.9

## 2019-06-16 HISTORY — DX: Hypokalemia: E87.6

## 2019-06-16 HISTORY — DX: Presence of spectacles and contact lenses: Z97.3

## 2019-06-16 HISTORY — DX: Postmenopausal bleeding: N95.0

## 2019-06-16 HISTORY — DX: Long term (current) use of insulin: Z79.4

## 2019-06-16 HISTORY — DX: Hypothyroidism, unspecified: E03.9

## 2019-06-16 HISTORY — DX: Sarcoidosis of lung: D86.0

## 2019-06-16 HISTORY — DX: Obstructive sleep apnea (adult) (pediatric): G47.33

## 2019-06-16 LAB — POCT I-STAT, CHEM 8
BUN: 15 mg/dL (ref 6–20)
Calcium, Ion: 1.29 mmol/L (ref 1.15–1.40)
Chloride: 97 mmol/L — ABNORMAL LOW (ref 98–111)
Creatinine, Ser: 1.2 mg/dL — ABNORMAL HIGH (ref 0.44–1.00)
Glucose, Bld: 116 mg/dL — ABNORMAL HIGH (ref 70–99)
HCT: 42 % (ref 36.0–46.0)
Hemoglobin: 14.3 g/dL (ref 12.0–15.0)
Potassium: 2.9 mmol/L — ABNORMAL LOW (ref 3.5–5.1)
Sodium: 143 mmol/L (ref 135–145)
TCO2: 33 mmol/L — ABNORMAL HIGH (ref 22–32)

## 2019-06-16 LAB — GLUCOSE, CAPILLARY: Glucose-Capillary: 91 mg/dL (ref 70–99)

## 2019-06-16 LAB — TYPE AND SCREEN
ABO/RH(D): B POS
Antibody Screen: NEGATIVE

## 2019-06-16 LAB — ABO/RH: ABO/RH(D): B POS

## 2019-06-16 SURGERY — DILATATION & CURETTAGE/HYSTEROSCOPY WITH MYOSURE
Anesthesia: General | Site: Vagina

## 2019-06-16 MED ORDER — LACTATED RINGERS IV SOLN: INTRAVENOUS | Status: DC

## 2019-06-16 MED ORDER — POTASSIUM CHLORIDE 10 MEQ/100ML IV SOLN
10.0000 meq | Freq: Once | INTRAVENOUS | Status: AC
  Administered 2019-06-16: 10 meq via INTRAVENOUS
  Filled 2019-06-16: qty 100

## 2019-06-16 MED ORDER — OXYCODONE HCL 5 MG PO TABS: 5.0000 mg | ORAL_TABLET | Freq: Once | ORAL | Status: DC | PRN

## 2019-06-16 MED ORDER — FENTANYL CITRATE (PF) 100 MCG/2ML IJ SOLN
INTRAMUSCULAR | Status: DC | PRN
  Administered 2019-06-16: 25 ug via INTRAVENOUS
  Administered 2019-06-16: 50 ug via INTRAVENOUS
  Administered 2019-06-16: 25 ug via INTRAVENOUS
  Administered 2019-06-16: 50 ug via INTRAVENOUS

## 2019-06-16 MED ORDER — OXYCODONE HCL 5 MG/5ML PO SOLN: 5.0000 mg | Freq: Once | ORAL | Status: DC | PRN

## 2019-06-16 MED ORDER — PROPOFOL 10 MG/ML IV BOLUS
INTRAVENOUS | Status: AC
  Filled 2019-06-16: qty 20

## 2019-06-16 MED ORDER — ONDANSETRON HCL 4 MG/2ML IJ SOLN
INTRAMUSCULAR | Status: DC | PRN
  Administered 2019-06-16: 4 mg via INTRAVENOUS

## 2019-06-16 MED ORDER — PROMETHAZINE HCL 25 MG/ML IJ SOLN: 6.2500 mg | INTRAMUSCULAR | Status: DC | PRN

## 2019-06-16 MED ORDER — MIDAZOLAM HCL 5 MG/5ML IJ SOLN
INTRAMUSCULAR | Status: DC | PRN
  Administered 2019-06-16: 2 mg via INTRAVENOUS

## 2019-06-16 MED ORDER — CHLORHEXIDINE GLUCONATE 0.12 % MT SOLN
15.0000 mL | Freq: Once | OROMUCOSAL | Status: AC
  Administered 2019-06-16: 15 mL via OROMUCOSAL

## 2019-06-16 MED ORDER — ONDANSETRON HCL 4 MG/2ML IJ SOLN
INTRAMUSCULAR | Status: AC
  Filled 2019-06-16: qty 2

## 2019-06-16 MED ORDER — FENTANYL CITRATE (PF) 100 MCG/2ML IJ SOLN: 25.0000 ug | INTRAMUSCULAR | Status: DC | PRN

## 2019-06-16 MED ORDER — CHLORHEXIDINE GLUCONATE 0.12 % MT SOLN
OROMUCOSAL | Status: AC
  Filled 2019-06-16: qty 15

## 2019-06-16 MED ORDER — DEXAMETHASONE SODIUM PHOSPHATE 10 MG/ML IJ SOLN
INTRAMUSCULAR | Status: DC | PRN
  Administered 2019-06-16: 10 mg via INTRAVENOUS

## 2019-06-16 MED ORDER — LIDOCAINE 2% (20 MG/ML) 5 ML SYRINGE
INTRAMUSCULAR | Status: AC
  Filled 2019-06-16: qty 5

## 2019-06-16 MED ORDER — ACETAMINOPHEN 500 MG PO TABS
ORAL_TABLET | ORAL | Status: AC
  Filled 2019-06-16: qty 2

## 2019-06-16 MED ORDER — CLONIDINE HCL 0.2 MG PO TABS
ORAL_TABLET | ORAL | Status: AC
  Filled 2019-06-16: qty 1

## 2019-06-16 MED ORDER — MIDAZOLAM HCL 2 MG/2ML IJ SOLN
INTRAMUSCULAR | Status: AC
  Filled 2019-06-16: qty 2

## 2019-06-16 MED ORDER — PROPOFOL 10 MG/ML IV BOLUS
INTRAVENOUS | Status: DC | PRN
  Administered 2019-06-16: 180 mg via INTRAVENOUS

## 2019-06-16 MED ORDER — BISOPROLOL FUMARATE 5 MG PO TABS
10.0000 mg | ORAL_TABLET | Freq: Once | ORAL | Status: AC
  Administered 2019-06-16: 10 mg via ORAL
  Filled 2019-06-16 (×2): qty 2
  Filled 2019-06-16: qty 1

## 2019-06-16 MED ORDER — DEXAMETHASONE SODIUM PHOSPHATE 10 MG/ML IJ SOLN
INTRAMUSCULAR | Status: AC
  Filled 2019-06-16: qty 1

## 2019-06-16 MED ORDER — LIDOCAINE 2% (20 MG/ML) 5 ML SYRINGE
INTRAMUSCULAR | Status: DC | PRN
  Administered 2019-06-16: 100 mg via INTRAVENOUS

## 2019-06-16 MED ORDER — SODIUM CHLORIDE 0.9 % IR SOLN
Status: DC | PRN
  Administered 2019-06-16: 3000 mL

## 2019-06-16 MED ORDER — ACETAMINOPHEN 500 MG PO TABS
1000.0000 mg | ORAL_TABLET | ORAL | Status: AC
  Administered 2019-06-16: 1000 mg via ORAL

## 2019-06-16 MED ORDER — CLONIDINE HCL 0.2 MG PO TABS
0.2000 mg | ORAL_TABLET | Freq: Once | ORAL | Status: AC
  Administered 2019-06-16: 0.2 mg via ORAL

## 2019-06-16 MED ORDER — FENTANYL CITRATE (PF) 100 MCG/2ML IJ SOLN
INTRAMUSCULAR | Status: AC
  Filled 2019-06-16: qty 2

## 2019-06-16 MED ORDER — AMLODIPINE BESYLATE 10 MG PO TABS
10.0000 mg | ORAL_TABLET | Freq: Every day | ORAL | Status: DC
  Administered 2019-06-16: 10 mg via ORAL
  Filled 2019-06-16: qty 1

## 2019-06-16 MED ORDER — ORAL CARE MOUTH RINSE: 15.0000 mL | Freq: Once | OROMUCOSAL | Status: AC

## 2019-06-16 SURGICAL SUPPLY — 14 items
COVER WAND RF STERILE (DRAPES) ×2 IMPLANT
DEVICE MYOSURE REACH (MISCELLANEOUS) ×1 IMPLANT
GAUZE 4X4 16PLY RFD (DISPOSABLE) ×2 IMPLANT
GLOVE BIO SURGEON STRL SZ 6.5 (GLOVE) ×2 IMPLANT
GLOVE BIO SURGEON STRL SZ7 (GLOVE) ×2 IMPLANT
GOWN STRL REUS W/TWL LRG LVL3 (GOWN DISPOSABLE) ×4 IMPLANT
IV NS IRRIG 3000ML ARTHROMATIC (IV SOLUTION) ×2 IMPLANT
KIT PROCEDURE FLUENT (KITS) ×2 IMPLANT
KIT TURNOVER CYSTO (KITS) ×2 IMPLANT
PACK VAGINAL MINOR WOMEN LF (CUSTOM PROCEDURE TRAY) ×2 IMPLANT
PAD OB MATERNITY 4.3X12.25 (PERSONAL CARE ITEMS) ×2 IMPLANT
PAD PREP 24X48 CUFFED NSTRL (MISCELLANEOUS) ×2 IMPLANT
SEAL ROD LENS SCOPE MYOSURE (ABLATOR) ×2 IMPLANT
TOWEL OR 17X26 10 PK STRL BLUE (TOWEL DISPOSABLE) ×2 IMPLANT

## 2019-06-16 NOTE — Progress Notes (Signed)
Okay for patient to go home as long as she uses O2 when she sits or lies down and uses Incentive spirometry.

## 2019-06-16 NOTE — Op Note (Signed)
Preoperative Diagnosis: Postmenopausal bleeding, endometrial polyps  Postoperative Diagnosis: Same  Procedure: Hysteroscopy, polypectomy, dilation and curettage  Surgeon: Dr Sumner Boast  Assistants: None  Anesthesia: General via LMA  EBL: minimal  Fluids: 450 cc LR  Fluid deficit: 50  Urine output: not recorded  Indications for surgery: The patient is a 59 yo female, who presented with postmenopausal bleeding. Work up included an endometrial biopsy with weakly proliferative endometrium, a negative pap and sonohysterogram with endometrial polyps. The risks of the surgery were reviewed with the patient and the consent form was signed prior to her surgery.  Findings: 1 large, 1 small and 1 sessile polyp. Otherwise thin endometrium, normal tubal ostia bilaterally  Specimens: Endometrial polyps, endometrial curettings   Procedure: The patient was taken to the operating room with an IV in place. She was placed in the dorsal lithotomy position and anesthesia was administered. She was prepped and draped in the usual sterile fashion for a vaginal procedure. She voided on the way to the OR. A weighted speculum was placed in the vagina and a single tooth tenaculum was placed on the anterior lip of the cervix. The cervix was dilated to a #6 hagar dilator. The uterus was sounded to 8 cm. The myosure hysteroscope was inserted into the uterine cavity. With continuous infusion of normal saline, the uterine cavity was visualized with the above findings. The myosure reach was used to resect the polyps. The myosure was then removed. The cavity was then curetted with the small sharp curette. The cavity had the characteristically gritty texture at the end of the procedure. The curette and the single tooth tenaculum were removed. The speculum was removed. The patients perineum was cleansed of betadine and she was taken out of the dorsal lithotomy position.  Upon awakening the LMA was removed and the patient  was transferred to the recovery room in stable and awake condition.  The sponge and instrument count were correct. There were no complications.   CC: Dorothea Ogle, PA-C         Dr Terrence Dupont

## 2019-06-16 NOTE — Transfer of Care (Signed)
Immediate Anesthesia Transfer of Care Note  Patient: Amy Rosario  Procedure(s) Performed: DILATATION & CURETTAGE/HYSTEROSCOPY WITH MYOSURE/POLYPECTOMY (N/A Vagina )  Patient Location: PACU  Anesthesia Type:General  Level of Consciousness: awake, alert  and oriented  Airway & Oxygen Therapy: Patient Spontanous Breathing and Patient connected to nasal cannula oxygen  Post-op Assessment: Report given to RN and Post -op Vital signs reviewed and stable  Post vital signs: Reviewed and stable  Last Vitals:  Vitals Value Taken Time  BP 134/79 06/16/19 0806  Temp    Pulse 70 06/16/19 0808  Resp 14 06/16/19 0808  SpO2 98 % 06/16/19 0808  Vitals shown include unvalidated device data.  Last Pain:  Vitals:   06/16/19 0626  TempSrc: Oral  PainSc: 0-No pain      Patients Stated Pain Goal: 4 ([id number])  Complications: No apparent anesthesia complications

## 2019-06-16 NOTE — Anesthesia Postprocedure Evaluation (Signed)
Anesthesia Post Note  Patient: Amy Rosario  Procedure(s) Performed: DILATATION & CURETTAGE/HYSTEROSCOPY WITH MYOSURE/POLYPECTOMY (N/A Vagina )     Patient location during evaluation: PACU Anesthesia Type: General Level of consciousness: awake and alert Pain management: pain level controlled Vital Signs Assessment: post-procedure vital signs reviewed and stable Respiratory status: spontaneous breathing, nonlabored ventilation and respiratory function stable Cardiovascular status: blood pressure returned to baseline and stable Postop Assessment: no apparent nausea or vomiting Anesthetic complications: no    Last Vitals:  Vitals:   06/16/19 0900 06/16/19 0915  BP: 140/67 134/63  Pulse: 73 72  Resp: 13 17  Temp:    SpO2: 99% 96%    Last Pain:  Vitals:   06/16/19 0830  TempSrc:   PainSc: 0-No pain                 Audry Pili

## 2019-06-16 NOTE — Anesthesia Procedure Notes (Signed)
Procedure Name: Intubation Date/Time: 06/16/2019 7:38 AM Performed by: Chariti Havel D, CRNA Pre-anesthesia Checklist: Patient identified, Emergency Drugs available, Suction available and Patient being monitored Patient Re-evaluated:Patient Re-evaluated prior to induction Oxygen Delivery Method: Circle system utilized Preoxygenation: Pre-oxygenation with 100% oxygen Induction Type: IV induction Ventilation: Mask ventilation without difficulty LMA: LMA inserted LMA Size: 4.0 Tube type: Oral Number of attempts: 1 Placement Confirmation: positive ETCO2 and breath sounds checked- equal and bilateral Tube secured with: Tape Dental Injury: Teeth and Oropharynx as per pre-operative assessment

## 2019-06-16 NOTE — Interval H&P Note (Signed)
History and Physical Interval Note:  06/16/2019 7:15 AM  Amy Rosario  has presented today for surgery, with the diagnosis of Postmenopausal bleeding, Endometrial polyp.  The various methods of treatment have been discussed with the patient and family. After consideration of risks, benefits and other options for treatment, the patient has consented to  Procedure(s) with comments: [place] (N/A) - polypectomy as a surgical intervention.  The patient's history has been reviewed, patient examined, no change in status, stable for surgery.  I have reviewed the patient's chart and labs.  Questions were answered to the patient's satisfaction.   The patient has been cleared for surgery by her Primary and Cardiologist.    Salvadore Dom

## 2019-06-16 NOTE — Discharge Instructions (Signed)
DISCHARGE INSTRUCTIONS: HYSTEROSCOPY / ENDOMETRIAL ABLATION The following instructions have been prepared to help you care for yourself upon your return home.  May take stool softner while taking narcotic pain medication to prevent constipation.  Drink plenty of water.  Personal hygiene: Marland Kitchen Use sanitary pads for vaginal drainage, not tampons. . Shower the day after your procedure. . NO tub baths, pools or Jacuzzis for 2-3 weeks. . Wipe front to back after using the bathroom.  Activity and limitations: . Do NOT drive or operate any equipment for 24 hours. The effects of anesthesia are still present and drowsiness may result. . Do NOT rest in bed all day. . Walking is encouraged. . Walk up and down stairs slowly. . You may resume your normal activity in one to two days or as indicated by your physician. Sexual activity: NO intercourse for at least 2 weeks after the procedure, or as indicated by your Doctor.  Diet: Eat a light meal as desired this evening. You may resume your usual diet tomorrow.  Return to Work: You may resume your work activities in one to two days or as indicated by Marine scientist.  What to expect after your surgery: Expect to have vaginal bleeding/discharge for 2-3 days and spotting for up to 10 days. It is not unusual to have soreness for up to 1-2 weeks. You may have a slight burning sensation when you urinate for the first day. Mild cramps may continue for a couple of days. You may have a regular period in 2-6 weeks.  Call your doctor for any of the following: . Excessive vaginal bleeding or clotting, saturating and changing one pad every hour. . Inability to urinate 6 hours after discharge from hospital. . Pain not relieved by pain medication. . Fever of 100.4 F or greater. . Unusual vaginal discharge or odor.  Post Anesthesia Home Care Instructions  Activity: Get plenty of rest for the remainder of the day. A responsible individual must stay with you for  24 hours following the procedure.  For the next 24 hours, DO NOT: -Drive a car -Paediatric nurse -Drink alcoholic beverages -Take any medication unless instructed by your physician -Make any legal decisions or sign important papers.  Meals: Start with liquid foods such as gelatin or soup. Progress to regular foods as tolerated. Avoid greasy, spicy, heavy foods. If nausea and/or vomiting occur, drink only clear liquids until the nausea and/or vomiting subsides. Call your physician if vomiting continues.  Special Instructions/Symptoms: Your throat may feel dry or sore from the anesthesia or the breathing tube placed in your throat during surgery. If this causes discomfort, gargle with warm salt water. The discomfort should disappear within 24 hours.  If you had a scopolamine patch placed behind your ear for the management of post- operative nausea and/or vomiting:  1. The medication in the patch is effective for 72 hours, after which it should be removed.  Wrap patch in a tissue and discard in the trash. Wash hands thoroughly with soap and water. 2. You may remove the patch earlier than 72 hours if you experience unpleasant side effects which may include dry mouth, dizziness or visual disturbances. 3. Avoid touching the patch. Wash your hands with soap and water after contact with the patch.

## 2019-06-17 LAB — SURGICAL PATHOLOGY

## 2019-06-19 ENCOUNTER — Other Ambulatory Visit: Payer: Self-pay | Admitting: Endocrinology

## 2019-06-30 ENCOUNTER — Other Ambulatory Visit: Payer: Self-pay | Admitting: Medical

## 2019-07-01 ENCOUNTER — Encounter: Payer: Self-pay | Admitting: Obstetrics and Gynecology

## 2019-07-01 ENCOUNTER — Ambulatory Visit (INDEPENDENT_AMBULATORY_CARE_PROVIDER_SITE_OTHER): Payer: BC Managed Care – PPO | Admitting: Obstetrics and Gynecology

## 2019-07-01 ENCOUNTER — Other Ambulatory Visit: Payer: Self-pay

## 2019-07-01 VITALS — BP 180/88 | HR 118 | Temp 98.6°F | Ht 67.0 in | Wt 314.8 lb

## 2019-07-01 DIAGNOSIS — L720 Epidermal cyst: Secondary | ICD-10-CM

## 2019-07-01 DIAGNOSIS — Z9889 Other specified postprocedural states: Secondary | ICD-10-CM

## 2019-07-01 NOTE — Progress Notes (Signed)
GYNECOLOGY  VISIT   HPI: 59 y.o.   Single Black or African American Not Hispanic or Latino  female   [phone] with Patient's last menstrual period was 11/09/2013.   here for  2 week post opt after D&C. Patient denies bleeding since surgery.  She had a hysteroscopy, polypectomy, D&C with benign pathology. She c/o a 2 day h/o a left vulvar bump, not tender.   GYNECOLOGIC HISTORY: Patient's last menstrual period was 11/09/2013. Contraception:NA Menopausal hormone therapy: none        OB History    Gravida  3   Para  3   Term  3   Preterm      AB      Living  3     SAB      TAB      Ectopic      Multiple      Live Births                 Patient Active Problem List   Diagnosis Date Noted  . History of sarcoidosis 06/08/2019  . Joint swelling 06/08/2019  . Hypokalemia 06/08/2019  . Decreased pedal pulses 02/24/2018  . Vitamin D deficiency 05/20/2017  . Diffuse goiter 05/04/2017  . Upper airway cough syndrome 05/03/2017  . Nail hypertrophy 10/04/2016  . Vaccine counseling 10/04/2016  . Need for influenza vaccination 10/04/2016  . Chronic gout without tophus 08/29/2016  . Elevated alkaline phosphatase level 03/08/2016  . Abnormal ultrasound of abdomen 03/08/2016  . Fatty liver 03/08/2016  . Encounter for screening mammogram for malignant neoplasm of breast 03/08/2016  . Abnormal cervical Papanicolaou smear 03/08/2016  . Type 2 diabetes mellitus with hyperglycemia, with long-term current use of insulin ([place]) 06/27/2015  . OSA on CPAP 06/27/2015  . Morbid obesity due to excess calories ([place]) 05/27/2013  . Essential hypertension, benign 05/27/2013  . Hyperlipidemia 05/27/2013    Past Medical History:  Diagnosis Date  . Chronic gout    followed by pcp---  multiple sites (06-11-2019 per pt last episode fall 2020)  . H/O echocardiogram 04-04-2018  received via fax on 06-11-2019 to be scanned in   Dr. Charolette Forward, [facility]-  .  Hyperlipidemia   . Hypertension 2000   managed by Dr. Terrence Dupont, cardiology  . Hypokalemia   . Hypothyroidism    endocrinologist-- dr Loanne Drilling-- s/p bx's 03-18-2018 and 07-02-2013 results in epic  . OSA on CPAP    pt states has older machine is very large  . Peripheral neuropathy   . PMB (postmenopausal bleeding)   . Sarcoidosis of lung [facility])    pulmonology-- dr wert---  s/p brochoscopy 07-05-2016, no sarcoid  (dr wert released pt as needed per lov note in epic 06-02-2018)  . Thyroid goiter   . Type 2 diabetes mellitus treated with insulin [facility])    endocrinology--- dr Loanne Drilling   (06-11-2019  per pt checks blood surgar daily in am,  fasting surgar-- 85 -- 130)  . Wears glasses     Past Surgical History:  Procedure Laterality Date  . COLONOSCOPY WITH PROPOFOL N/A 09/21/2013   Procedure: COLONOSCOPY WITH PROPOFOL;  Surgeon: Juanita Craver, MD;  Location: [facility];  Service: Endoscopy;  Laterality: N/A;  . CYST EXCISION  teen   right lower back, benign  . DILATATION & CURETTAGE/HYSTEROSCOPY WITH MYOSURE N/A 06/16/2019   Procedure: DILATATION & CURETTAGE/HYSTEROSCOPY WITH MYOSURE/POLYPECTOMY;  Surgeon: Salvadore Dom, MD;  Location[facility];  Service: Gynecology;  Laterality: N/A;  polypectomy  . ENDOMETRIAL BIOPSY  2021   Dr. Sumner Boast  . TUBAL LIGATION Bilateral yrs ago  . VIDEO BRONCHOSCOPY Bilateral 07/05/2016   Procedure: VIDEO BRONCHOSCOPY WITH FLUORO;  Surgeon: Tanda Rockers, MD;  [facility];  Service: Endoscopy;  Laterality: Bilateral;    Current Outpatient Medications  Medication Sig Dispense Refill  . allopurinol (ZYLOPRIM) 300 MG tablet Take 1 tablet (300 mg total) by mouth daily. (Patient taking differently: Take 300 mg by mouth daily. ) 90 tablet 3  . amLODipine (NORVASC) 10 MG tablet TAKE 1 TABLET BY MOUTH EVERY DAY 30 tablet 1  . ASPIRIN LOW DOSE 81 MG EC tablet TAKE 1 TABLET BY MOUTH EVERY DAY (Patient taking differently: Take 81 mg  by mouth daily. ) 90 tablet 0  . bisoprolol (ZEBETA) 10 MG tablet TAKE 1 TABLET BY MOUTH EVERY DAY (Patient taking differently: Take 10 mg by mouth daily. ) 90 tablet 0  . Blood Glucose Monitoring Suppl (ONE TOUCH ULTRA 2) w/Device KIT 1 Device by Does not apply route 2 (two) times daily. 1 each 0  . cholecalciferol (VITAMIN D3) 25 MCG (1000 UNIT) tablet TAKE 1 TABLET BY MOUTH EVERY DAY (Patient taking differently: Take 1,000 Units by mouth daily. ) 90 tablet 3  . cloNIDine (CATAPRES) 0.2 MG tablet TAKE 1 TABLET (0.2 MG TOTAL) BY MOUTH 3 (THREE) TIMES DAILY. (Patient taking differently: Take 0.2 mg by mouth 3 (three) times daily. ) 270 tablet 1  . famotidine (PEPCID) 20 MG tablet TAKE 1 TABLET BY MOUTH EVERYDAY AT BEDTIME (Patient taking differently: Take 20 mg by mouth at bedtime. TAKE 1 TABLET BY MOUTH EVERYDAY AT BEDTIME) 90 tablet 3  . gabapentin (NEURONTIN) 100 MG capsule TAKE 1 CAPSULE (100 MG TOTAL) BY MOUTH AT BEDTIME. 90 capsule 3  . insulin degludec (TRESIBA FLEXTOUCH) 200 UNIT/ML FlexTouch Pen Inject 130 Units into the skin daily. (Patient taking differently: Inject 130 Units into the skin daily. ) 18 pen 2  . Insulin Pen Needle (BD PEN NEEDLE NANO U/F) 32G X 4 MM MISC USE 4X A DAY 200 each 6  . Lancets (ONETOUCH DELICA PLUS [id number]) MISC 100 PENS BY DOES NOT APPLY ROUTE 2 (TWO) TIMES DAILY. 100 each 2  . levothyroxine (SYNTHROID) 25 MCG tablet Take 1 tablet (25 mcg total) by mouth daily before breakfast. (Patient taking differently: Take 25 mcg by mouth daily before breakfast. ) 90 tablet 3  . metFORMIN (GLUCOPHAGE) 1000 MG tablet TAKE 1 TABLET (1,000 MG TOTAL) BY MOUTH 2 (TWO) TIMES DAILY WITH A MEAL. 180 tablet 3  . Multiple Vitamin (MULTIVITAMIN) tablet Take 1 tablet by mouth daily.    Glory Rosebush VERIO test strip CHECKS SUGARS 4 TIMES DAILY, ON MEAL TIME INSULIN 100 strip 6  . OZEMPIC, 1 MG/DOSE, 2 MG/1.5ML SOPN INJECT 1 MG INTO THE SKIN ONCE A WEEK. 3 pen 11  . pantoprazole  (PROTONIX) 40 MG tablet Take 1 tablet (40 mg total) by mouth daily. Take 30-60 min before first meal of the day (Patient taking differently: Take 40 mg by mouth daily. Take 30-60 min before first meal of the day) 30 tablet 2  . potassium chloride (KLOR-CON) 20 MEQ packet Take 20 mEq by mouth 2 (two) times daily. (Patient taking differently: Take 20 mEq by mouth 2 (two) times daily. ) 180 each 3  . rosuvastatin (CRESTOR) 20 MG tablet TAKE 1 TABLET BY MOUTH EVERY DAY (Patient taking differently: Take 20 mg by mouth daily. ) 90 tablet  0  . valsartan-hydrochlorothiazide (DIOVAN-HCT) 320-25 MG tablet TAKE 1 TABLET BY MOUTH EVERY DAY (Patient taking differently: Take 1 tablet by mouth daily. ) 30 tablet 1   No current facility-administered medications for this visit.     ALLERGIES: Penicillins  Family History  Problem Relation Age of Onset  . Heart disease Father   . Hypertension Father   . Heart disease Brother   . Cancer Neg Hx   . Stroke Neg Hx   . Diabetes Neg Hx     Social History   Socioeconomic History  . Marital status: Single    Spouse name: Not on file  . Number of children: Not on file  . Years of education: Not on file  . Highest education level: Not on file  Occupational History  . Not on file  Tobacco Use  . Smoking status: Never Smoker  . Smokeless tobacco: Never Used  Substance and Sexual Activity  . Alcohol use: No  . Drug use: Never  . Sexual activity: Not on file  Other Topics Concern  . Not on file  Social History Narrative   Lives with sister [name], single, has 3 sons.   Doing some walking for exercise.  Prior Consulting civil engineer at [employer], visit different churches.  Awaiting disability determination.   06/2019.     Social Determinants of Health   Financial Resource Strain:   . Difficulty of Paying Living Expenses:   Food Insecurity:   . Worried About Charity fundraiser in the Last Year:   . Arboriculturist in the Last Year:    Transportation Needs:   . Film/video editor (Medical):   Marland Kitchen Lack of Transportation (Non-Medical):   Physical Activity:   . Days of Exercise per Week:   . Minutes of Exercise per Session:   Stress:   . Feeling of Stress :   Social Connections:   . Frequency of Communication with Friends and Family:   . Frequency of Social Gatherings with Friends and Family:   . Attends Religious Services:   . Active Member of Clubs or Organizations:   . Attends Archivist Meetings:   Marland Kitchen Marital Status:   Intimate Partner Violence:   . Fear of Current or Ex-Partner:   . Emotionally Abused:   Marland Kitchen Physically Abused:   . Sexually Abused:     Review of Systems  All other systems reviewed and are negative.   PHYSICAL EXAMINATION:    BP (!) 180/88   Pulse (!) 118   Temp 98.6 F (37 C)   Ht _0  (1.702 m)   Wt (!) 314 lb 12.8 oz (142.8 kg)   LMP 11/09/2013   SpO2 91%   BMI 49.30 kg/m     General appearance: alert, cooperative and appears stated age  Pelvic: External genitalia:  Epidermal cyst on inner left labia majora. No erythema.              Urethra:  normal appearing urethra with no masses, tenderness or lesions                Chaperone was present for exam.  ASSESSMENT S/P hysteroscopy, doing well, no further bleeding. Benign pathology Epidermal cyst of vulva    PLAN Routine f/u.

## 2019-07-11 ENCOUNTER — Other Ambulatory Visit: Payer: Self-pay | Admitting: Medical

## 2019-07-20 ENCOUNTER — Ambulatory Visit
Admission: RE | Admit: 2019-07-20 | Discharge: 2019-07-20 | Disposition: A | Discharge status: Home / Self Care | Payer: BC Managed Care – PPO | Source: Ambulatory Visit | Attending: Medical | Admitting: Medical

## 2019-07-20 ENCOUNTER — Other Ambulatory Visit: Payer: Self-pay | Admitting: Medical

## 2019-07-20 ENCOUNTER — Other Ambulatory Visit: Payer: Self-pay

## 2019-07-20 DIAGNOSIS — Z1231 Encounter for screening mammogram for malignant neoplasm of breast: Secondary | ICD-10-CM

## 2019-07-20 DIAGNOSIS — Z0000 Encounter for general adult medical examination without abnormal findings: Secondary | ICD-10-CM

## 2019-07-23 ENCOUNTER — Other Ambulatory Visit: Payer: Self-pay | Admitting: Medical

## 2019-07-24 ENCOUNTER — Other Ambulatory Visit: Payer: Self-pay | Admitting: Medical

## 2019-07-24 ENCOUNTER — Other Ambulatory Visit: Payer: Self-pay | Admitting: Internal Medicine

## 2019-07-24 DIAGNOSIS — R058 Other specified cough: Secondary | ICD-10-CM

## 2019-07-25 ENCOUNTER — Other Ambulatory Visit: Payer: Self-pay | Admitting: Medical

## 2019-08-03 ENCOUNTER — Ambulatory Visit: Payer: BC Managed Care – PPO | Admitting: Endocrinology

## 2019-08-03 ENCOUNTER — Encounter: Payer: Self-pay | Admitting: Endocrinology

## 2019-08-03 ENCOUNTER — Other Ambulatory Visit: Payer: Self-pay

## 2019-08-03 VITALS — BP 132/88 | HR 89 | Ht 67.0 in | Wt 317.8 lb

## 2019-08-03 DIAGNOSIS — E1165 Type 2 diabetes mellitus with hyperglycemia: Secondary | ICD-10-CM

## 2019-08-03 DIAGNOSIS — Z794 Long term (current) use of insulin: Secondary | ICD-10-CM | POA: Diagnosis not present

## 2019-08-03 LAB — POCT GLYCOSYLATED HEMOGLOBIN (HGB A1C): Hemoglobin A1C: 7.8 % — AB (ref 4.0–5.6)

## 2019-08-03 MED ORDER — TRESIBA FLEXTOUCH 200 UNIT/ML ~~LOC~~ SOPN: 140.0000 [IU] | PEN_INJECTOR | Freq: Every day | SUBCUTANEOUS | 2 refills | Status: DC

## 2019-08-03 NOTE — Progress Notes (Signed)
Subjective:    Patient ID: Amy Rosario, female    DOB: 06/10/1960, 59 y.o.   MRN: [id number]  HPI Pt returns for f/u of diabetes mellitus: DM type: Insulin-requiring type 2.   Dx'ed: 2015.  Complications: none Therapy: insulin since 2017, Ozempic, and metformin.  GDM: never DKA: never Severe hypoglycemia: never Pancreatitis: never.  Pancreatic imaging: normal on 2018 [place].  Other: she takes QD insulin, after poor results with multiple daily injections; she takes intermitt prednisone for sarcoidosis; She stopped farxiga, due to vaginal bleeding.  Interval history: no recent steroids.  no cbg record, but states cbg's vary from 95-170.   Pt says she occasionally misses the [place].    Pt also has MNG (dx'ed 2015; bx in 2020 showed Beth Cat 3, and affirma reported insuff material; f/u US in 2020 was unchanged--no f/u needed).   Past Medical History:  Diagnosis Date   Chronic gout    followed by pcp---  multiple sites (06-11-2019 per pt last episode fall 2020)   H/O echocardiogram 04-04-2018  received via fax on 06-11-2019 to be scanned in   Dr. Charolette Forward, [facility]-   Hyperlipidemia    Hypertension 2000   managed by Dr. Terrence Dupont, cardiology   Hypokalemia    Hypothyroidism    endocrinologist-- dr Loanne Drilling-- s/p bx's 03-18-2018 and 07-02-2013 results in epic   OSA on CPAP    pt states has older machine is very large   Peripheral neuropathy    PMB (postmenopausal bleeding)    Sarcoidosis of lung [facility])    pulmonology-- dr wert---  s/p brochoscopy 07-05-2016, no sarcoid  (dr wert released pt as needed per lov note in epic 06-02-2018)   Thyroid goiter    Type 2 diabetes mellitus treated with insulin [facility])    endocrinology--- dr Loanne Drilling   (06-11-2019  per pt checks blood surgar daily in am,  fasting surgar-- 85 -- 130)   Wears glasses     Past Surgical History:  Procedure Laterality Date   COLONOSCOPY WITH PROPOFOL N/A 09/21/2013   Procedure:  COLONOSCOPY WITH PROPOFOL;  Surgeon: Juanita Craver, MD;  [facility];  Service: Endoscopy;  Laterality: N/A;   CYST EXCISION  teen   right lower back, benign   DILATATION & CURETTAGE/HYSTEROSCOPY WITH MYOSURE N/A 06/16/2019   Procedure: DILATATION & CURETTAGE/HYSTEROSCOPY WITH MYOSURE/POLYPECTOMY;  Surgeon: Salvadore Dom, MD;  Location: [place];  Service: Gynecology;  Laterality: N/A;  polypectomy   ENDOMETRIAL BIOPSY  2021   Dr. Sumner Boast   TUBAL LIGATION Bilateral yrs ago   VIDEO BRONCHOSCOPY Bilateral 07/05/2016   Procedure: [place];  Surgeon: Tanda Rockers, MD;  [facility];  Service: Endoscopy;  Laterality: Bilateral;    Social History   Socioeconomic History   Marital status: Single    Spouse name: Not on file   Number of children: Not on file   Years of education: Not on file   Highest education level: Not on file  Occupational History   Not on file  Tobacco Use   Smoking status: Never Smoker   Smokeless tobacco: Never Used  Vaping Use   Vaping Use: Never used  Substance and Sexual Activity   Alcohol use: No   Drug use: Never   Sexual activity: Not on file  Other Topics Concern   Not on file  Social History Narrative   Lives with sister Amy Rosario, single, has 3 sons.   Doing some walking  for exercise.  Prior Consulting civil engineer at [employer], visit different churches.  Awaiting disability determination.   06/2019.     Social Determinants of Health   Financial Resource Strain:    Difficulty of Paying Living Expenses:   Food Insecurity:    Worried About Charity fundraiser in the Last Year:    Arboriculturist in the Last Year:   Transportation Needs:    Film/video editor (Medical):    Lack of Transportation (Non-Medical):   Physical Activity:    Days of Exercise per Week:    Minutes of Exercise per Session:   Stress:    Feeling of Stress :   Social  Connections:    Frequency of Communication with Friends and Family:    Frequency of Social Gatherings with Friends and Family:    Attends Religious Services:    Active Member of Clubs or Organizations:    Attends Music therapist:    Marital Status:   Intimate Partner Violence:    Fear of Current or Ex-Partner:    Emotionally Abused:    Physically Abused:    Sexually Abused:     Current Outpatient Medications on File Prior to Visit  Medication Sig Dispense Refill   allopurinol (ZYLOPRIM) 300 MG tablet Take 1 tablet (300 mg total) by mouth daily. (Patient taking differently: Take 300 mg by mouth daily. ) 90 tablet 3   amLODipine (NORVASC) 10 MG tablet TAKE 1 TABLET BY MOUTH EVERY DAY 30 tablet 1   ASPIRIN LOW DOSE 81 MG EC tablet TAKE 1 TABLET BY MOUTH EVERY DAY 90 tablet 0   bisoprolol (ZEBETA) 10 MG tablet TAKE 1 TABLET BY MOUTH EVERY DAY 90 tablet 0   Blood Glucose Monitoring Suppl (ONE TOUCH ULTRA 2) w/Device KIT 1 Device by Does not apply route 2 (two) times daily. 1 each 0   cholecalciferol (VITAMIN D3) 25 MCG (1000 UNIT) tablet TAKE 1 TABLET BY MOUTH EVERY DAY (Patient taking differently: Take 1,000 Units by mouth daily. ) 90 tablet 3   cloNIDine (CATAPRES) 0.2 MG tablet TAKE 1 TABLET (0.2 MG TOTAL) BY MOUTH 3 (THREE) TIMES DAILY. (Patient taking differently: Take 0.2 mg by mouth 3 (three) times daily. ) 270 tablet 1   famotidine (PEPCID) 20 MG tablet TAKE 1 TABLET BY MOUTH EVERYDAY AT BEDTIME (Patient taking differently: Take 20 mg by mouth at bedtime. TAKE 1 TABLET BY MOUTH EVERYDAY AT BEDTIME) 90 tablet 3   gabapentin (NEURONTIN) 100 MG capsule TAKE 1 CAPSULE (100 MG TOTAL) BY MOUTH AT BEDTIME. 90 capsule 3   Insulin Pen Needle (BD PEN NEEDLE NANO U/F) 32G X 4 MM MISC USE 4X A DAY 200 each 6   Lancets (ONETOUCH DELICA PLUS [id number]) MISC 100 PENS BY DOES NOT APPLY ROUTE 2 (TWO) TIMES DAILY. 100 each 2   levothyroxine (SYNTHROID) 25 MCG tablet  Take 1 tablet (25 mcg total) by mouth daily before breakfast. (Patient taking differently: Take 25 mcg by mouth daily before breakfast. ) 90 tablet 3   metFORMIN (GLUCOPHAGE) 1000 MG tablet TAKE 1 TABLET (1,000 MG TOTAL) BY MOUTH 2 (TWO) TIMES DAILY WITH A MEAL. 180 tablet 3   Multiple Vitamin (MULTIVITAMIN) tablet Take 1 tablet by mouth daily.     ONETOUCH VERIO test strip CHECKS SUGARS 4 TIMES DAILY, ON MEAL TIME INSULIN 100 strip 6   OZEMPIC, 1 MG/DOSE, 2 MG/1.5ML SOPN INJECT 1 MG INTO THE SKIN ONCE A WEEK. 3 pen 11  pantoprazole (PROTONIX) 40 MG tablet Take 1 tablet (40 mg total) by mouth daily. Take 30-60 min before first meal of the day (Patient taking differently: Take 40 mg by mouth daily. Take 30-60 min before first meal of the day) 30 tablet 2   potassium chloride (KLOR-CON) 20 MEQ packet Take 20 mEq by mouth 2 (two) times daily. (Patient taking differently: Take 20 mEq by mouth 2 (two) times daily. ) 180 each 3   rosuvastatin (CRESTOR) 20 MG tablet TAKE 1 TABLET BY MOUTH EVERY DAY 90 tablet 0   valsartan-hydrochlorothiazide (DIOVAN-HCT) 320-25 MG tablet TAKE 1 TABLET BY MOUTH EVERY DAY (Patient taking differently: Take 1 tablet by mouth daily. ) 30 tablet 1   No current facility-administered medications on file prior to visit.    Allergies  Allergen Reactions   Penicillins Hives and Swelling    Has patient had a PCN reaction causing immediate rash, facial/tongue/throat swelling, SOB or lightheadedness with hypotension: Yes Has patient had a PCN reaction causing severe rash involving mucus membranes or skin necrosis: No Has patient had a PCN reaction that required hospitalization: No Has patient had a PCN reaction occurring within the last 10 years: No If all of the above answers are "NO", then may proceed with Cephalosporin use.    Family History  Problem Relation Age of Onset   Heart disease Father    Hypertension Father    Heart disease Brother    Cancer Neg Hx     Stroke Neg Hx    Diabetes Neg Hx     BP 132/88    Pulse 89    Ht _0  (1.702 m)    Wt (!) 317 lb 12.8 oz (144.2 kg)    LMP 11/09/2013    SpO2 95%    BMI 49.77 kg/m    Review of Systems She denies hypoglycemia    Objective:   Physical Exam VITAL SIGNS:  See vs page GENERAL: no distress NECK: thyroid is 5 times normal size--diffuse Pulses: dorsalis pedis intact bilat.   MSK: no deformity of the feet CV: 1+ bilat leg edema Skin:  no ulcer on the feet.  normal color and temp on the feet. Neuro: sensation is intact to touch on the feet  Lab Results  Component Value Date   HGBA1C 7.8 (A) 08/03/2019       Assessment & Plan:  Insulin-requiring type 2 DM: She would benefit from increased rx, if it can be done with a regimen that avoids or minimizes hypoglycemia.  MNG: clinically stable.  We'll follow by phys exam.   Patient Instructions  Please increase the Tresiba to 140 units daily.  If you miss this in the morning, you can still take the full amount later in the day.   continue the same other diabetes medications.  check your blood sugar twice a day.  vary the time of day when you check, between before the 3 meals, and at bedtime.  also check if you have symptoms of your blood sugar being too high or too low.  please keep a record of the readings and bring it to your next appointment here (or you can bring the meter itself).  You can write it on any piece of paper.  please call us sooner if your blood sugar goes below 70, or if you have a lot of readings over 200. Please come back for a follow-up appointment in 3 months.

## 2019-08-03 NOTE — Patient Instructions (Addendum)
Please increase the Tresiba to 140 units daily.  If you miss this in the morning, you can still take the full amount later in the day.   continue the same other diabetes medications.  check your blood sugar twice a day.  vary the time of day when you check, between before the 3 meals, and at bedtime.  also check if you have symptoms of your blood sugar being too high or too low.  please keep a record of the readings and bring it to your next appointment here (or you can bring the meter itself).  You can write it on any piece of paper.  please call us sooner if your blood sugar goes below 70, or if you have a lot of readings over 200. Please come back for a follow-up appointment in 3 months.

## 2019-08-10 ENCOUNTER — Other Ambulatory Visit: Payer: Self-pay | Admitting: Medical

## 2019-08-18 ENCOUNTER — Other Ambulatory Visit: Payer: Self-pay | Admitting: Medical

## 2019-08-20 ENCOUNTER — Other Ambulatory Visit: Payer: Self-pay | Admitting: Endocrinology

## 2019-08-24 ENCOUNTER — Encounter: Payer: Self-pay | Attending: Endocrinology | Admitting: Dietician

## 2019-08-24 ENCOUNTER — Encounter: Payer: Self-pay | Admitting: Dietician

## 2019-08-24 ENCOUNTER — Other Ambulatory Visit: Payer: Self-pay

## 2019-08-24 DIAGNOSIS — Z794 Long term (current) use of insulin: Secondary | ICD-10-CM | POA: Insufficient documentation

## 2019-08-24 DIAGNOSIS — E1165 Type 2 diabetes mellitus with hyperglycemia: Secondary | ICD-10-CM | POA: Insufficient documentation

## 2019-08-24 NOTE — Progress Notes (Signed)
Diabetes Self-Management Education  Visit Type: First/Initial  Appt. Start Time: 1040 Appt. End Time: 7619  09/02/2019  [name], identified by name and date of birth, is a 59 y.o. female with a diagnosis of Diabetes: Type 2.   ASSESSMENT Patient is here today alone.  She is a patient of Dr. Loanne Drilling.  History includes Type 2 diabetes dx 2015 and insulin since 2017, HLD, HTN, OSA- on cpap, sarcoidosis, goiter A1C 7.8% 6.28.21' Medications include Tresiba 140 units q am, Metformin, Ozempic  Weight hx:  316 lbs 08/24/2019 266 lbs 2018 291 lbs 08/02/2015 325 lbs 2016 (lost but stopping soda)   Patient lives with her sister and her sister's 57 yo son.  Her sister does the cooking now.  She states that her sister told her to stop cooking when she got sick in 2018 as she would leave the stove on.   She is now on disability.  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 Avoids salt. She states that she needs to reduce her intake of fried foods, reduce juice, and sweet tea. Height 5\' 9"  (1.753 m), weight (!) 316 lb (143.3 kg), last menstrual period 11/09/2013. Body mass index is 46.67 kg/m.   Diabetes Self-Management Education - 08/24/19 1109      Visit Information   Visit Type First/Initial      Initial Visit   Diabetes Type Type 2    Are you currently following a meal plan? No    Are you taking your medications as prescribed? Yes    Date Diagnosed 2015      Health Coping   How would you rate your overall health? Fair      Psychosocial Assessment   Patient Belief/Attitude about Diabetes Afraid    Self-care barriers Debilitated state due to current medical condition    Self-management support Doctor's office;Family    Other persons present Patient    Patient Concerns Nutrition/Meal planning;Glycemic Control;Weight Control    Special Needs None    Preferred Learning Style No preference indicated    Learning Readiness Ready    How often do you need  to have someone help you when you read instructions, pamphlets, or other written materials from your doctor or pharmacy? 1 - Never    What is the last grade level you completed in school? 12      Pre-Education Assessment   Patient understands the diabetes disease and treatment process. Needs Review    Patient understands incorporating nutritional management into lifestyle. Needs Review    Patient undertands incorporating physical activity into lifestyle. Needs Review    Patient understands using medications safely. Needs Review    Patient understands monitoring blood glucose, interpreting and using results Needs Review    Patient understands prevention, detection, and treatment of acute complications. Needs Review    Patient understands prevention, detection, and treatment of chronic complications. Needs Review    Patient understands how to develop strategies to address psychosocial issues. Needs Review    Patient understands how to develop strategies to promote health/change behavior. Needs Review      Complications   Last HgB A1C per patient/outside source 7.8 %   08/03/2019 decreased frin 8.5% 06/2019   How often do you check your blood sugar? 1-2 times/day    Fasting Blood glucose range (mg/dL) 130-179;70-129    Postprandial Blood glucose range (mg/dL) >200    Number of hypoglycemic episodes per month 0    Number of hyperglycemic episodes per  week 3    Can you tell when your blood sugar is high? No    Have you had a dilated eye exam in the past 12 months? No    Have you had a dental exam in the past 12 months? No    Are you checking your feet? Yes    How many days per week are you checking your feet? 1      Dietary Intake   Breakfast skips OR sausage biscuit, sweet tea or OJ (out to eat) - 3 times per week    Snack (morning) none    Lunch skips if she ate breakfast and often otherwise OR tomato or banana sandwich on [employer] bread    Snack (afternoon) NABS or handful of chips, 5-6 M&M's      Dinner pork chop, red potatoes, peas    Snack (evening) occasional chips or fruit    Beverage(s) white grape juice, OJ, water, sweet tea, 2% milk      Exercise   Exercise Type Light (walking / raking leaves)    How many days per week to you exercise? 5    How many minutes per day do you exercise? 30    Total minutes per week of exercise 150      Patient Education   Previous Diabetes Education Yes (please comment)   Ubaldo Glassing, RD at [place] 2016   Disease state  Definition of diabetes, type 1 and 2, and the diagnosis of diabetes    Nutrition management  Role of diet in the treatment of diabetes and the relationship between the three main macronutrients and blood glucose level;Meal timing in regards to the patients' current diabetes medication.;Meal options for control of blood glucose level and chronic complications.;Information on hints to eating out and maintain blood glucose control.    Physical activity and exercise  Role of exercise on diabetes management, blood pressure control and cardiac health.;Helped patient identify appropriate exercises in relation to his/her diabetes, diabetes complications and other health issue.    Medications Reviewed patients medication for diabetes, action, purpose, timing of dose and side effects.    Monitoring Purpose and frequency of SMBG.;Identified appropriate SMBG and/or A1C goals.;Yearly dilated eye exam;Daily foot exams    Acute complications Taught treatment of hypoglycemia - the 15 rule.    Chronic complications Relationship between chronic complications and blood glucose control;Assessed and discussed foot care and prevention of foot problems;Retinopathy and reason for yearly dilated eye exams    Psychosocial adjustment Worked with patient to identify barriers to care and solutions;Role of stress on diabetes;Identified and addressed patients feelings and concerns about diabetes      Individualized Goals (developed by patient)   Nutrition General  guidelines for healthy choices and portions discussed    Physical Activity Exercise 5-7 days per week;30 minutes per day    Medications take my medication as prescribed    Monitoring  test my blood glucose as discussed    Reducing Risk treat hypoglycemia with 15 grams of carbs if blood glucose less than 70mg /dL;increase portions of healthy fats      Post-Education Assessment   Patient understands the diabetes disease and treatment process. Demonstrates understanding / competency    Patient understands incorporating nutritional management into lifestyle. Needs Review    Patient undertands incorporating physical activity into lifestyle. Demonstrates understanding / competency    Patient understands using medications safely. Demonstrates understanding / competency    Patient understands monitoring blood glucose, interpreting and using results Demonstrates understanding /  competency    Patient understands prevention, detection, and treatment of acute complications. Demonstrates understanding / competency    Patient understands prevention, detection, and treatment of chronic complications. Demonstrates understanding / competency    Patient understands how to develop strategies to address psychosocial issues. Needs Review    Patient understands how to develop strategies to promote health/change behavior. Needs Review      Outcomes   Expected Outcomes Demonstrated interest in learning. Expect positive outcomes    Future DMSE 2 months    Program Status Not Completed           Individualized Plan for Diabetes Self-Management Training:   Learning Objective:  Patient will have a greater understanding of diabetes self-management. Patient education plan is to attend individual and/or group sessions per assessed needs and concerns.   Plan:   Patient Instructions  Make an eye appointment Check your feet daily Continue to stay active Continue to avoid salt Consider changing to 1% milk Rethink  what you drink.  Choose beverages without carbohydrates.   Expected Outcomes:  Demonstrated interest in learning. Expect positive outcomes  Education material provided: Meal plan card, My Plate and Snack sheet; ADA Type 2 Diabetes next step book  If problems or questions, patient to contact team via:  Phone  Future DSME appointment: 2 months

## 2019-08-24 NOTE — Patient Instructions (Addendum)
Make an eye appointment Check your feet daily Continue to stay active Continue to avoid salt Consider changing to 1% milk Rethink what you drink.  Choose beverages without carbohydrates.

## 2019-09-15 ENCOUNTER — Other Ambulatory Visit: Payer: Self-pay | Admitting: Medical

## 2019-10-19 ENCOUNTER — Other Ambulatory Visit: Payer: Self-pay

## 2019-10-19 ENCOUNTER — Encounter: Payer: Self-pay | Admitting: Dietician

## 2019-10-19 ENCOUNTER — Encounter: Payer: BC Managed Care – PPO | Attending: Endocrinology | Admitting: Dietician

## 2019-10-19 DIAGNOSIS — Z794 Long term (current) use of insulin: Secondary | ICD-10-CM | POA: Diagnosis present

## 2019-10-19 DIAGNOSIS — E1165 Type 2 diabetes mellitus with hyperglycemia: Secondary | ICD-10-CM | POA: Diagnosis present

## 2019-10-19 NOTE — Patient Instructions (Signed)
Recommend making an eye appointment Continue to choose water most often.  Juice and regular soda will make your blood sugar go up. Continue to stay active.  Great job on this! Continue a low sodium diet. Aim for a consistent meal schedule Add more vegetables to your day.

## 2019-10-20 ENCOUNTER — Other Ambulatory Visit: Payer: Self-pay | Admitting: Medical

## 2019-10-20 NOTE — Progress Notes (Signed)
Diabetes Self-Management Education  Visit Type: Follow-up  Appt. Start Time: 0905 Appt. End Time: 0930  10/20/2019  [name], identified by name and date of birth, is a 59 y.o. female with a diagnosis of Diabetes: Type 2.   ASSESSMENT Patient is here today alone and was last seen on 08/24/2019.  She is a patient of Dr. Loanne Drilling. She states since last visit she has been walking more (about 30 minutes daily. She is drinking fewer sodas, less juice and decreased milk to 1%. She continues to avoid salt. She reports continued poor appetite since her sarcoidosis diagnosis 2018.  Weight has decreased 5 lbs in the past 2 months.  History includes Type 2 diabetes dx 2015 and insulin since 2017, HLD, HTN, OSA- on cpap, sarcoidosis, goiter A1C 7.8% 6.28.21' Medications include Tresiba 140 units q am, Metformin, Ozempic  Weight hx:  311 lbs 09/18/2019 316 lbs 08/24/2019 266 lbs 2018 291 lbs 08/02/2015 325 lbs 2016 (lost but stopping soda)  Patient lives with her sister and her sister's 72 yo son.  Her sister does the cooking now.  She states that her sister told her to stop cooking when she got sick in 2018 as she would leave the stove on.   She is now on disability.  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 Avoids salt.  Last menstrual period 11/09/2013. There is no height or weight on file to calculate BMI.   Diabetes Self-Management Education - 10/20/19 1919      Visit Information   Visit Type Follow-up      Initial Visit   Diabetes Type Type 2    Are you taking your medications as prescribed? Yes      Psychosocial Assessment   Self-care barriers Debilitated state due to current medical condition    Self-management support Doctor's office;Family    Other persons present Patient    Patient Concerns Nutrition/Meal planning;Glycemic Control;Weight Control    Special Needs None      Pre-Education Assessment   Patient understands the diabetes disease  and treatment process. Demonstrates understanding / competency    Patient understands incorporating nutritional management into lifestyle. Needs Review    Patient undertands incorporating physical activity into lifestyle. Demonstrates understanding / competency    Patient understands using medications safely. Demonstrates understanding / competency    Patient understands monitoring blood glucose, interpreting and using results Demonstrates understanding / competency    Patient understands prevention, detection, and treatment of acute complications. Demonstrates understanding / competency    Patient understands prevention, detection, and treatment of chronic complications. Demonstrates understanding / competency    Patient understands how to develop strategies to address psychosocial issues. Needs Review    Patient understands how to develop strategies to promote health/change behavior. Needs Review      Dietary Intake   Breakfast small cereal, 1% milk, red hot sausage    Lunch skips    Dinner 4-5 oz steak    Snack (evening) 5 cinnamon jelly beans    Beverage(s) water, 10 oz soda, occasional juice, 1% milk      Exercise   Exercise Type Light (walking / raking leaves)    How many days per week to you exercise? 7    How many minutes per day do you exercise? 30    Total minutes per week of exercise 210      Patient Education   Previous Diabetes Education Yes (please comment)   08/2019   Nutrition management  Meal options for control of blood glucose level and chronic complications.    Medications Reviewed patients medication for diabetes, action, purpose, timing of dose and side effects.    Psychosocial adjustment Worked with patient to identify barriers to care and solutions      Individualized Goals (developed by patient)   Nutrition General guidelines for healthy choices and portions discussed    Physical Activity Exercise 5-7 days per week;30 minutes per day    Medications take my  medication as prescribed    Monitoring  test my blood glucose as discussed    Health Coping discuss diabetes with (comment)   MD, RD, [facility]     Patient Self-Evaluation of Goals - Patient rates self as meeting previously set goals (% of time)   Nutrition 50 - 75 %    Physical Activity >75%    Medications >75%    Monitoring >75%    Problem Solving >75%    Reducing Risk >75%    Health Coping >75%      Post-Education Assessment   Patient understands the diabetes disease and treatment process. Demonstrates understanding / competency    Patient understands incorporating nutritional management into lifestyle. Needs Review    Patient undertands incorporating physical activity into lifestyle. Demonstrates understanding / competency    Patient understands using medications safely. Demonstrates understanding / competency    Patient understands monitoring blood glucose, interpreting and using results Demonstrates understanding / competency    Patient understands prevention, detection, and treatment of acute complications. Demonstrates understanding / competency    Patient understands prevention, detection, and treatment of chronic complications. Demonstrates understanding / competency    Patient understands how to develop strategies to address psychosocial issues. Demonstrates understanding / competency    Patient understands how to develop strategies to promote health/change behavior. Needs Review      Outcomes   Expected Outcomes Demonstrated interest in learning. Expect positive outcomes    Future DMSE 3-4 months    Program Status Not Completed      Subsequent Visit   Since your last visit have you continued or begun to take your medications as prescribed? Yes    Since your last visit have you had your blood pressure checked? Yes    Since your last visit have you experienced any weight changes? Loss    Weight Loss (lbs) 5    Since your last visit, are you checking your blood glucose at least  once a day? Yes           Individualized Plan for Diabetes Self-Management Training:   Learning Objective:  Patient will have a greater understanding of diabetes self-management. Patient education plan is to attend individual and/or group sessions per assessed needs and concerns.   Plan:   Patient Instructions  Recommend making an eye appointment Continue to choose water most often.  Juice and regular soda will make your blood sugar go up. Continue to stay active.  Great job on this! Continue a low sodium diet. Aim for a consistent meal schedule Add more vegetables to your day.     Expected Outcomes:  Demonstrated interest in learning. Expect positive outcomes  Education material provided:   If problems or questions, patient to contact team via:  Phone  Future DSME appointment: 3-4 months

## 2019-10-21 ENCOUNTER — Other Ambulatory Visit: Payer: Self-pay | Admitting: Medical

## 2019-11-09 ENCOUNTER — Ambulatory Visit (INDEPENDENT_AMBULATORY_CARE_PROVIDER_SITE_OTHER): Payer: BC Managed Care – PPO | Admitting: Endocrinology

## 2019-11-09 ENCOUNTER — Encounter: Payer: Self-pay | Admitting: Endocrinology

## 2019-11-09 ENCOUNTER — Other Ambulatory Visit: Payer: Self-pay

## 2019-11-09 VITALS — BP 136/86 | HR 72 | Ht 69.0 in | Wt 312.8 lb

## 2019-11-09 DIAGNOSIS — Z794 Long term (current) use of insulin: Secondary | ICD-10-CM

## 2019-11-09 DIAGNOSIS — E1165 Type 2 diabetes mellitus with hyperglycemia: Secondary | ICD-10-CM

## 2019-11-09 LAB — TSH: TSH: 4.61 u[IU]/mL — ABNORMAL HIGH (ref 0.35–4.50)

## 2019-11-09 LAB — T4, FREE: Free T4: 0.72 ng/dL (ref 0.60–1.60)

## 2019-11-09 LAB — POCT GLYCOSYLATED HEMOGLOBIN (HGB A1C): Hemoglobin A1C: 8.9 % — AB (ref 4.0–5.6)

## 2019-11-09 MED ORDER — TRESIBA FLEXTOUCH 200 UNIT/ML ~~LOC~~ SOPN
150.0000 [IU] | PEN_INJECTOR | Freq: Every day | SUBCUTANEOUS | 2 refills | Status: DC
Start: 2019-11-09 — End: 2019-11-13

## 2019-11-09 MED ORDER — LEVOTHYROXINE SODIUM 50 MCG PO TABS: 50.0000 ug | ORAL_TABLET | Freq: Every day | ORAL | 3 refills | Status: DC

## 2019-11-09 NOTE — Progress Notes (Signed)
Subjective:    Patient ID: Amy Rosario, female    DOB: [birth year], 59 y.o.   MRN: [id number]  HPI Pt returns for f/u of diabetes mellitus: DM type: Insulin-requiring type 2.   Dx'ed: 2015.  Complications: none Therapy: insulin since 2017, Ozempic, and metformin.  GDM: never DKA: never Severe hypoglycemia: never Pancreatitis: never.  Pancreatic imaging: normal on 2018 [place].  Other: she takes QD insulin, after poor results with multiple daily injections; she takes intermitt prednisone for sarcoidosis; She stopped farxiga, due to vaginal bleeding.  Interval history: no recent steroids.  no cbg record, but states cbg's vary from 95-230.   Pt says she still occasionally misses the [place].   Pt also has MNG (dx'ed 2015; bx in 2020 showed Beth Cat 3, and affirma reported insuff material; f/u US in 2020 was unchanged--no f/u needed).   Past Medical History:  Diagnosis Date  . Chronic gout    followed by pcp---  multiple sites (06-11-2019 per pt last episode fall 2020)  . H/O echocardiogram 04-04-2018  received via fax on 06-11-2019 to be scanned in   Dr. Charolette Forward, [facility]-  . Hyperlipidemia   . Hypertension 2000   managed by Dr. Terrence Dupont, cardiology  . Hypokalemia   . Hypothyroidism    endocrinologist-- dr Loanne Drilling-- s/p bx's 03-18-2018 and 07-02-2013 results in epic  . OSA on CPAP    pt states has older machine is very large  . Peripheral neuropathy   . PMB (postmenopausal bleeding)   . Sarcoidosis of lung [facility])    pulmonology-- dr wert---  s/p brochoscopy 07-05-2016, no sarcoid  (dr wert released pt as needed per lov note in epic 06-02-2018)  . Thyroid goiter   . Type 2 diabetes mellitus treated with insulin [facility])    endocrinology--- dr Loanne Drilling   (06-11-2019  per pt checks blood surgar daily in am,  fasting surgar-- 85 -- 130)  . Wears glasses     Past Surgical History:  Procedure Laterality Date  . COLONOSCOPY WITH PROPOFOL N/A 09/21/2013    Procedure: COLONOSCOPY WITH PROPOFOL;  Surgeon: Juanita Craver, MD;  Location: [facility];  Service: Endoscopy;  Laterality: N/A;  . CYST EXCISION  teen   right lower back, benign  . DILATATION & CURETTAGE/HYSTEROSCOPY WITH MYOSURE N/A 06/16/2019   Procedure: DILATATION & CURETTAGE/HYSTEROSCOPY WITH MYOSURE/POLYPECTOMY;  Surgeon: Salvadore Dom, MD;  Location[facility];  Service: Gynecology;  Laterality: N/A;  polypectomy  . ENDOMETRIAL BIOPSY  2021   Dr. Sumner Boast  . TUBAL LIGATION Bilateral yrs ago  . VIDEO BRONCHOSCOPY Bilateral 07/05/2016   Procedure: VIDEO BRONCHOSCOPY WITH FLUORO;  Surgeon: Tanda Rockers, MD;  Location: [facility];  Service: Endoscopy;  Laterality: Bilateral;    Social History   Socioeconomic History  . Marital status: Single    Spouse name: Not on file  . Number of children: Not on file  . Years of education: Not on file  . Highest education level: Not on file  Occupational History  . Not on file  Tobacco Use  . Smoking status: Never Smoker  . Smokeless tobacco: Never Used  Vaping Use  . Vaping Use: Never used  Substance and Sexual Activity  . Alcohol use: No  . Drug use: Never  . Sexual activity: Not on file  Other Topics Concern  . Not on file  Social History Narrative   Lives with sister [name], single, has 3 sons.   Doing some walking  for exercise.  Prior Consulting civil engineer at [employer], visit different churches.  Awaiting disability determination.   06/2019.     Social Determinants of Health   Financial Resource Strain:   . Difficulty of Paying Living Expenses: Not on file  Food Insecurity:   . Worried About Charity fundraiser in the Last Year: Not on file  . Ran Out of Food in the Last Year: Not on file  Transportation Needs:   . Lack of Transportation (Medical): Not on file  . Lack of Transportation (Non-Medical): Not on file  Physical Activity:   . Days of Exercise per Week: Not on file  . Minutes  of Exercise per Session: Not on file  Stress:   . Feeling of Stress : Not on file  Social Connections:   . Frequency of Communication with Friends and Family: Not on file  . Frequency of Social Gatherings with Friends and Family: Not on file  . Attends Religious Services: Not on file  . Active Member of Clubs or Organizations: Not on file  . Attends Archivist Meetings: Not on file  . Marital Status: Not on file  Intimate Partner Violence:   . Fear of Current or Ex-Partner: Not on file  . Emotionally Abused: Not on file  . Physically Abused: Not on file  . Sexually Abused: Not on file    Current Outpatient Medications on File Prior to Visit  Medication Sig Dispense Refill  . allopurinol (ZYLOPRIM) 300 MG tablet Take 1 tablet (300 mg total) by mouth daily. (Patient taking differently: Take 300 mg by mouth daily. ) 90 tablet 3  . amLODipine (NORVASC) 10 MG tablet TAKE 1 TABLET BY MOUTH EVERY DAY 30 tablet 0  . ASPIRIN LOW DOSE 81 MG EC tablet TAKE 1 TABLET BY MOUTH EVERY DAY 30 tablet 0  . bisoprolol (ZEBETA) 10 MG tablet TAKE 1 TABLET BY MOUTH EVERY DAY 30 tablet 0  . Blood Glucose Monitoring Suppl (ONE TOUCH ULTRA 2) w/Device KIT 1 Device by Does not apply route 2 (two) times daily. 1 each 0  . cholecalciferol (VITAMIN D3) 25 MCG (1000 UNIT) tablet TAKE 1 TABLET BY MOUTH EVERY DAY (Patient taking differently: Take 1,000 Units by mouth daily. ) 90 tablet 3  . cloNIDine (CATAPRES) 0.2 MG tablet TAKE 1 TABLET (0.2 MG TOTAL) BY MOUTH 3 (THREE) TIMES DAILY. 270 tablet 1  . famotidine (PEPCID) 20 MG tablet TAKE 1 TABLET BY MOUTH EVERYDAY AT BEDTIME 90 tablet 3  . gabapentin (NEURONTIN) 100 MG capsule TAKE 1 CAPSULE (100 MG TOTAL) BY MOUTH AT BEDTIME. 90 capsule 3  . Insulin Pen Needle (BD PEN NEEDLE NANO U/F) 32G X 4 MM MISC USE 4X A DAY 200 each 6  . Lancets (ONETOUCH DELICA PLUS [id number]) MISC 100 PENS BY DOES NOT APPLY ROUTE 2 (TWO) TIMES DAILY. 100 each 2  . metFORMIN  (GLUCOPHAGE) 1000 MG tablet TAKE 1 TABLET (1,000 MG TOTAL) BY MOUTH 2 (TWO) TIMES DAILY WITH A MEAL. 180 tablet 3  . Multiple Vitamin (MULTIVITAMIN) tablet Take 1 tablet by mouth daily.    Glory Rosebush VERIO test strip CHECKS SUGARS 4 TIMES DAILY, ON MEAL TIME INSULIN 100 strip 6  . OZEMPIC, 1 MG/DOSE, 2 MG/1.5ML SOPN INJECT 1 MG INTO THE SKIN ONCE A WEEK. 3 pen 11  . pantoprazole (PROTONIX) 40 MG tablet Take 1 tablet (40 mg total) by mouth daily. Take 30-60 min before first meal of the day 30 tablet 2  .  potassium chloride (KLOR-CON) 20 MEQ packet Take 20 mEq by mouth 2 (two) times daily. (Patient taking differently: Take 20 mEq by mouth 2 (two) times daily. ) 180 each 3  . rosuvastatin (CRESTOR) 20 MG tablet TAKE 1 TABLET BY MOUTH EVERY DAY 30 tablet 0  . valsartan-hydrochlorothiazide (DIOVAN-HCT) 320-25 MG tablet TAKE 1 TABLET BY MOUTH EVERY DAY (Patient taking differently: Take 1 tablet by mouth daily. ) 30 tablet 1   No current facility-administered medications on file prior to visit.    Allergies  Allergen Reactions  . Penicillins Hives and Swelling    Has patient had a PCN reaction causing immediate rash, facial/tongue/throat swelling, SOB or lightheadedness with hypotension: Yes Has patient had a PCN reaction causing severe rash involving mucus membranes or skin necrosis: No Has patient had a PCN reaction that required hospitalization: No Has patient had a PCN reaction occurring within the last 10 years: No If all of the above answers are "NO", then may proceed with Cephalosporin use.    Family History  Problem Relation Age of Onset  . Heart disease Father   . Hypertension Father   . Heart disease Brother   . Cancer Neg Hx   . Stroke Neg Hx   . Diabetes Neg Hx     BP 136/86   Pulse 72   Ht 5' 9"  (1.753 m)   Wt (!) 312 lb 12.8 oz (141.9 kg)   LMP 11/09/2013   SpO2 97%   BMI 46.19 kg/m    Review of Systems     Objective:   Physical Exam VITAL SIGNS:  See vs  page GENERAL: no distress Pulses: dorsalis pedis intact bilat.   MSK: no deformity of the feet CV: trace bilat leg edema Skin:  no ulcer on the feet.  normal color and temp on the feet. Neuro: sensation is intact to touch on the feet  Lab Results  Component Value Date   HGBA1C 8.9 (A) 11/09/2019       Assessment & Plan:  Insulin-requiring type 2 DM: uncontrolled.   Patient Instructions  Please increase the Tresiba to 150 units daily.  If you miss this in the morning, you can still take the full amount later in the day.   continue the same other diabetes medications.  check your blood sugar twice a day.  vary the time of day when you check, between before the 3 meals, and at bedtime.  also check if you have symptoms of your blood sugar being too high or too low.  please keep a record of the readings and bring it to your next appointment here (or you can bring the meter itself).  You can write it on any piece of paper.  please call us sooner if your blood sugar goes below 70, or if you have a lot of readings over 200. Please come back for a follow-up appointment in 2 months.

## 2019-11-09 NOTE — Patient Instructions (Signed)
Please increase the Tresiba to 150 units daily.  If you miss this in the morning, you can still take the full amount later in the day.   continue the same other diabetes medications.  check your blood sugar twice a day.  vary the time of day when you check, between before the 3 meals, and at bedtime.  also check if you have symptoms of your blood sugar being too high or too low.  please keep a record of the readings and bring it to your next appointment here (or you can bring the meter itself).  You can write it on any piece of paper.  please call us sooner if your blood sugar goes below 70, or if you have a lot of readings over 200. Please come back for a follow-up appointment in 2 months.

## 2019-11-12 ENCOUNTER — Other Ambulatory Visit: Payer: Self-pay | Admitting: Medical

## 2019-11-12 NOTE — Telephone Encounter (Signed)
Lmom for patient to call an schedule an appointment to be seen within a month for continued refills on her medications.

## 2019-11-12 NOTE — Telephone Encounter (Deleted)
Last office visit was on 06/08/2019 and no upcoming visit. Medications send into pharmacy.

## 2019-11-13 ENCOUNTER — Other Ambulatory Visit: Payer: Self-pay | Admitting: Endocrinology

## 2019-12-06 ENCOUNTER — Other Ambulatory Visit: Payer: Self-pay | Admitting: Medical

## 2019-12-11 ENCOUNTER — Other Ambulatory Visit: Payer: Self-pay | Admitting: Medical

## 2019-12-28 ENCOUNTER — Other Ambulatory Visit: Payer: Self-pay

## 2019-12-28 ENCOUNTER — Encounter: Payer: Self-pay | Admitting: Medical

## 2019-12-28 ENCOUNTER — Ambulatory Visit: Payer: BC Managed Care – PPO | Admitting: Medical

## 2019-12-28 VITALS — BP 138/79 | HR 96 | Ht 69.0 in | Wt 318.8 lb

## 2019-12-28 DIAGNOSIS — Z794 Long term (current) use of insulin: Secondary | ICD-10-CM

## 2019-12-28 DIAGNOSIS — E559 Vitamin D deficiency, unspecified: Secondary | ICD-10-CM

## 2019-12-28 DIAGNOSIS — E790 Hyperuricemia without signs of inflammatory arthritis and tophaceous disease: Secondary | ICD-10-CM

## 2019-12-28 DIAGNOSIS — K760 Fatty (change of) liver, not elsewhere classified: Secondary | ICD-10-CM

## 2019-12-28 DIAGNOSIS — I10 Essential (primary) hypertension: Secondary | ICD-10-CM | POA: Diagnosis not present

## 2019-12-28 DIAGNOSIS — E049 Nontoxic goiter, unspecified: Secondary | ICD-10-CM | POA: Diagnosis not present

## 2019-12-28 DIAGNOSIS — E785 Hyperlipidemia, unspecified: Secondary | ICD-10-CM

## 2019-12-28 DIAGNOSIS — Z9989 Dependence on other enabling machines and devices: Secondary | ICD-10-CM

## 2019-12-28 DIAGNOSIS — Z23 Encounter for immunization: Secondary | ICD-10-CM | POA: Insufficient documentation

## 2019-12-28 DIAGNOSIS — E040 Nontoxic diffuse goiter: Secondary | ICD-10-CM

## 2019-12-28 DIAGNOSIS — M1A9XX0 Chronic gout, unspecified, without tophus (tophi): Secondary | ICD-10-CM

## 2019-12-28 DIAGNOSIS — G4733 Obstructive sleep apnea (adult) (pediatric): Secondary | ICD-10-CM | POA: Diagnosis not present

## 2019-12-28 DIAGNOSIS — E1165 Type 2 diabetes mellitus with hyperglycemia: Secondary | ICD-10-CM

## 2019-12-28 DIAGNOSIS — R0989 Other specified symptoms and signs involving the circulatory and respiratory systems: Secondary | ICD-10-CM

## 2019-12-28 NOTE — Progress Notes (Signed)
Subjective: Chief Complaint  Patient presents with  . Medication Management   Here for physical  Medical team Dr. Sumner Boast, gynecology Dr. Juanita Craver, gastroenterology Dr. Renato Shin, endocrinology Prior with Dr. Christinia Gully, pulmonology Dr. Charolette Forward, cardiology Eye doctor No dentist currently, yet since last visit Ashima Shrake, Camelia Eng, PA-C here for primary care  Concerns She sees several specialist  She has a complicated medical history including diabetes, hypertension, hyperlipidemia, morbid obesity, thyroid goiter, history of gout, history of sarcoidosis,, sleep apnea.   Seeing endocrinology regularly for diabetes and thyroid  HTN - compliant with medication.  Checking home BPs.  Seeing 134/80s typically.  Not getting high readings at home.  Uric acid elevated - last visit we recommended increasing allopurinol to 300mg .  No side effects or problems with the medication.  Had 2 covid vaccines earlier this year.  Wants flu shot today.  No prior shingles vaccine.  Hasn't had covid booster.    Saw Dr. Loanne Drilling recently for diabetes f/u.  Tyler Aas was increased to 150u daily.  She has applied for disability and is awaiting determination.  Not currently working.  No new complaints   Past Medical History:  Diagnosis Date  . Chronic gout    followed by pcp---  multiple sites (06-11-2019 per pt last episode fall 2020)  . H/O echocardiogram 04-04-2018  received via fax on 06-11-2019 to be scanned in   Dr. Charolette Forward, [facility]-  . Hyperlipidemia   . Hypertension 2000   managed by Dr. Terrence Dupont, cardiology  . Hypokalemia   . Hypothyroidism    endocrinologist-- dr Loanne Drilling-- s/p bx's 03-18-2018 and 07-02-2013 results in epic  . OSA on CPAP    pt states has older machine is very large  . Peripheral neuropathy   . PMB (postmenopausal bleeding)   . Sarcoidosis of lung [facility])    pulmonology-- dr wert---  s/p brochoscopy 07-05-2016, no sarcoid   (dr wert released pt as needed per lov note in epic 06-02-2018)  . Thyroid goiter   . Type 2 diabetes mellitus treated with insulin [facility])    endocrinology--- dr Loanne Drilling   (06-11-2019  per pt checks blood surgar daily in am,  fasting surgar-- 85 -- 130)  . Wears glasses    ROS as in subjective    Objective: BP 138/79   Pulse 96   Ht 5\' 9"  (1.753 m)   Wt (!) 318 lb 12.8 oz (144.6 kg)   LMP 11/09/2013   SpO2 94%   BMI 47.08 kg/m   Wt Readings from Last 3 Encounters:  12/28/19 (!) 318 lb 12.8 oz (144.6 kg)  11/09/19 (!) 312 lb 12.8 oz (141.9 kg)  10/20/19 (!) 311 lb (141.1 kg)    BP Readings from Last 3 Encounters:  12/28/19 138/79  11/09/19 136/86  08/03/19 132/88   General appearence: alert, no distress, WD/WN, obese African American female Skin: warm, dry, no specific worrisome lesions Neck: supple, no lymphadenopathy, generalized large goiter, no masses Heart: RRR, normal S1, S2, no murmurs Lungs: CTA bilaterally, no wheezes, rhonchi, or rales Abdomen: +bs, soft, non tender, non distended, no masses, no hepatomegaly, no splenomegaly Pulses: 2+ symmetric, upper and 1+ bilat lower extremities, normal cap refill Ext: no edema Neuro: CN II through XII intact, DTRs 1+ in general, otherwise nonfocal exam      Assessment: Encounter Diagnoses  Name Primary?  . Essential hypertension, benign Yes  . OSA on CPAP   . Fatty liver   . Diffuse goiter   .  Type 2 diabetes mellitus with hyperglycemia, with long-term current use of insulin ([place])   . Chronic gout without tophus, unspecified cause, unspecified site   . Decreased pedal pulses   . Hyperlipidemia, unspecified hyperlipidemia type   . Vitamin D deficiency   . Elevated uric acid in blood        Plan: Decreased pulses in the feet ABI done 06/2019, normal  Obstructive sleep apnea-continue CPAP therapy  Obesity I strongly recommend weight loss through healthy diet, regular exercise, and we need to consider  other strategies such as nutritionist consult, weight management clinic or other.  Fatty liver disease  The main treatment for this is weight loss through healthy low-fat diet and exercise  Complications of fatty liver disease can include fibrosis and end-stage liver disease which can lead to bleeding problems, fluid overload, and liver failure   Diabetes, thyroid goiter (managed by Dr. Loanne Drilling) Continue routine follow-up with Dr. Loanne Drilling and medications per his orders.  I reviewed your recent labs and office notes from Dr. Loanne Drilling   Hypertension (managed by Dr. Rosebud Poles, but future refills should probably be through cardiology Dr. Terrence Dupont to avoid any confusion/duplications) continue   Bisoprolol 10mg , 1 tablet daily  Clonidine 0.2mg , three times daily  Valsartan HCT 320/25mg , 1 tablet daily  Amlodipine 10mg , 1 tablet daily   High cholesterol Continue Crestor daily at bedtime to lower risk of heart disease   Vitamin D deficiency Continue your vitamin D supplement, get fish in your diet, take a walk out in the sun several days per week for help with vitamin D absorption.   History of gout Continue current dose of Allopurionl last visit    Neuropathy  Continue Gabapentin/Neurontin to help control symptoms of neuropathy    Cancer screening  Colon cancer screening Your last colonoscopy was 09/2013 with Dr. Mickel Duhamel, normal. We will send you home with stool cards for intermediate screen.  She forgot to do these in June 2021.    Breast cancer screening Mammogram from 07/2019 reviewed.  I reviewed your Pap smear from March 2021 with Dr. Talbert Nan.  You are up-to-date on this.  You also recently had an endometrial biopsy.  Follow-up with Dr. Talbert Nan as advised    Vaccinations I recommend yearly flu shot  I recommended Covid vaccine  You are up-to-date on pneumonia 23 vaccine, tetanus vaccine, and you had a flu shot last year  Shingles vaccine:  I recommend  you have a shingles vaccine to help prevent shingles or herpes zoster outbreak.   Please call your insurer to inquire about coverage for the Shingrix vaccine given in 2 doses.   Some insurers cover this vaccine after age 65, some cover this after age 16.  If your insurer covers this, then call to schedule appointment to have this vaccine here.  With diabetes it is recommended you have a hepatitis B vaccine if you are not immune.  We are checking your immunity today  Meoshia was seen today for medication management.  Diagnoses and all orders for this visit:  Essential hypertension, benign  OSA on CPAP  Fatty liver  Diffuse goiter  Type 2 diabetes mellitus with hyperglycemia, with long-term current use of insulin ([facility])  Chronic gout without tophus, unspecified cause, unspecified site  Decreased pedal pulses  Hyperlipidemia, unspecified hyperlipidemia type  Vitamin D deficiency  Elevated uric acid in blood  Other orders -     Flu Vaccine QUAD 6+ mos PF IM (Fluarix Quad PF) -  Heplisav-B (HepB-CPG) Vaccine    F/u pending

## 2020-01-05 ENCOUNTER — Other Ambulatory Visit: Payer: Self-pay | Admitting: Medical

## 2020-01-11 ENCOUNTER — Encounter: Payer: Self-pay | Admitting: Endocrinology

## 2020-01-11 ENCOUNTER — Ambulatory Visit (INDEPENDENT_AMBULATORY_CARE_PROVIDER_SITE_OTHER): Payer: BC Managed Care – PPO | Admitting: Endocrinology

## 2020-01-11 ENCOUNTER — Other Ambulatory Visit: Payer: Self-pay

## 2020-01-11 VITALS — BP 142/80 | HR 85 | Ht 67.0 in | Wt 318.6 lb

## 2020-01-11 DIAGNOSIS — E1165 Type 2 diabetes mellitus with hyperglycemia: Secondary | ICD-10-CM

## 2020-01-11 DIAGNOSIS — Z794 Long term (current) use of insulin: Secondary | ICD-10-CM

## 2020-01-11 LAB — POCT GLYCOSYLATED HEMOGLOBIN (HGB A1C): Hemoglobin A1C: 7.9 % — AB (ref 4.0–5.6)

## 2020-01-11 NOTE — Patient Instructions (Addendum)
Your blood pressure is high today.  Please see your primary care provider soon, to have it rechecked If you miss the Tresiba in the morning, you can still take the full amount later in the day.   continue the same other diabetes medications.  You can take the insulin at any time of day.  Therefore, if you miss it in the morning, you can take it later in the day. check your blood sugar twice a day.  vary the time of day when you check, between before the 3 meals, and at bedtime.  also check if you have symptoms of your blood sugar being too high or too low.  please keep a record of the readings and bring it to your next appointment here (or you can bring the meter itself).  You can write it on any piece of paper.  please call us sooner if your blood sugar goes below 70, or if you have a lot of readings over 200. Please come back for a follow-up appointment in 3 months.

## 2020-01-11 NOTE — Progress Notes (Signed)
Subjective:    Patient ID: Amy Rosario, female    DOB: 14-May-1960, 59 y.o.   MRN: [id number]  HPI Pt returns for f/u of diabetes mellitus: DM type: Insulin-requiring type 2.   Dx'ed: 2015.  Complications: PN Therapy: insulin since 2017, Ozempic, and metformin.  GDM: never DKA: never Severe hypoglycemia: never Pancreatitis: never.  Pancreatic imaging: normal on 2018 [place].  Other: she takes QD insulin, after poor results with multiple daily injections; she takes intermitt prednisone for sarcoidosis; She stopped farxiga, due to vaginal bleeding.  Interval history: no recent steroids.  no cbg record, but states cbg's vary from 76-246.   Pt says she still misses the [place] 1-2 times per week, due to oversleeping.  She say Tyler Aas had a $0 copay.   Pt also has MNG (dx'ed 2015; bx in 2020 showed Beth Cat 3, and affirma reported insuff material; f/u US in 2020 was unchanged--no f/u needed).   Past Medical History:  Diagnosis Date  . Chronic gout    followed by pcp---  multiple sites (06-11-2019 per pt last episode fall 2020)  . H/O echocardiogram 04-04-2018  received via fax on 06-11-2019 to be scanned in   Dr. Charolette Forward, [facility]-  . Hyperlipidemia   . Hypertension 2000   managed by Dr. Terrence Dupont, cardiology  . Hypokalemia   . Hypothyroidism    endocrinologist-- dr Loanne Drilling-- s/p bx's 03-18-2018 and 07-02-2013 results in epic  . OSA on CPAP    pt states has older machine is very large  . Peripheral neuropathy   . PMB (postmenopausal bleeding)   . Sarcoidosis of lung [facility])    pulmonology-- dr wert---  s/p brochoscopy 07-05-2016, no sarcoid  (dr wert released pt as needed per lov note in epic 06-02-2018)  . Thyroid goiter   . Type 2 diabetes mellitus treated with insulin [facility])    endocrinology--- dr Loanne Drilling   (06-11-2019  per pt checks blood surgar daily in am,  fasting surgar-- 85 -- 130)  . Wears glasses     Past Surgical History:  Procedure Laterality  Date  . COLONOSCOPY WITH PROPOFOL N/A 09/21/2013   Procedure: COLONOSCOPY WITH PROPOFOL;  Surgeon: Juanita Craver, MD;  Location: [facility];  Service: Endoscopy;  Laterality: N/A;  . CYST EXCISION  teen   right lower back, benign  . DILATATION & CURETTAGE/HYSTEROSCOPY WITH MYOSURE N/A 06/16/2019   Procedure: DILATATION & CURETTAGE/HYSTEROSCOPY WITH MYOSURE/POLYPECTOMY;  Surgeon: Salvadore Dom, MD;  Location: [facility];  Service: Gynecology;  Laterality: N/A;  polypectomy  . ENDOMETRIAL BIOPSY  2021   Dr. Sumner Boast  . TUBAL LIGATION Bilateral yrs ago  . VIDEO BRONCHOSCOPY Bilateral 07/05/2016   Procedure: VIDEO BRONCHOSCOPY WITH FLUORO;  Surgeon: Tanda Rockers, MD;  Location: [facility];  Service: Endoscopy;  Laterality: Bilateral;    Social History   Socioeconomic History  . Marital status: Single    Spouse name: Not on file  . Number of children: Not on file  . Years of education: Not on file  . Highest education level: Not on file  Occupational History  . Not on file  Tobacco Use  . Smoking status: Never Smoker  . Smokeless tobacco: Never Used  Vaping Use  . Vaping Use: Never used  Substance and Sexual Activity  . Alcohol use: No  . Drug use: Never  . Sexual activity: Not on file  Other Topics Concern  . Not on file  Social History Narrative  Lives with sister [name], single, has 3 sons.   Doing some walking for exercise.  Prior Consulting civil engineer at [employer], visit different churches.  Awaiting disability determination.   06/2019.     Social Determinants of Health   Financial Resource Strain:   . Difficulty of Paying Living Expenses: Not on file  Food Insecurity:   . Worried About Charity fundraiser in the Last Year: Not on file  . Ran Out of Food in the Last Year: Not on file  Transportation Needs:   . Lack of Transportation (Medical): Not on file  . Lack of Transportation (Non-Medical): Not on file  Physical Activity:   .  Days of Exercise per Week: Not on file  . Minutes of Exercise per Session: Not on file  Stress:   . Feeling of Stress : Not on file  Social Connections:   . Frequency of Communication with Friends and Family: Not on file  . Frequency of Social Gatherings with Friends and Family: Not on file  . Attends Religious Services: Not on file  . Active Member of Clubs or Organizations: Not on file  . Attends Archivist Meetings: Not on file  . Marital Status: Not on file  Intimate Partner Violence:   . Fear of Current or Ex-Partner: Not on file  . Emotionally Abused: Not on file  . Physically Abused: Not on file  . Sexually Abused: Not on file    Current Outpatient Medications on File Prior to Visit  Medication Sig Dispense Refill  . allopurinol (ZYLOPRIM) 300 MG tablet Take 1 tablet (300 mg total) by mouth daily. (Patient taking differently: Take 300 mg by mouth daily. ) 90 tablet 3  . amLODipine (NORVASC) 10 MG tablet TAKE 1 TABLET BY MOUTH EVERY DAY 30 tablet 0  . aspirin 81 MG EC tablet TAKE 1 TABLET BY MOUTH EVERY DAY 30 tablet 0  . bisoprolol (ZEBETA) 10 MG tablet TAKE 1 TABLET BY MOUTH EVERY DAY 30 tablet 0  . Blood Glucose Monitoring Suppl (ONE TOUCH ULTRA 2) w/Device KIT 1 Device by Does not apply route 2 (two) times daily. 1 each 0  . cholecalciferol (VITAMIN D3) 25 MCG (1000 UNIT) tablet TAKE 1 TABLET BY MOUTH EVERY DAY (Patient taking differently: Take 1,000 Units by mouth daily. ) 90 tablet 3  . cloNIDine (CATAPRES) 0.2 MG tablet TAKE 1 TABLET (0.2 MG TOTAL) BY MOUTH 3 (THREE) TIMES DAILY. 270 tablet 1  . famotidine (PEPCID) 20 MG tablet TAKE 1 TABLET BY MOUTH EVERYDAY AT BEDTIME 90 tablet 3  . gabapentin (NEURONTIN) 100 MG capsule TAKE 1 CAPSULE (100 MG TOTAL) BY MOUTH AT BEDTIME. 90 capsule 3  . Insulin Pen Needle (BD PEN NEEDLE NANO U/F) 32G X 4 MM MISC USE 4X A DAY 200 each 6  . Lancets (ONETOUCH DELICA PLUS [id number]) MISC USE TWICE A DAY 100 each 2  . levothyroxine  (SYNTHROID) 50 MCG tablet Take 1 tablet (50 mcg total) by mouth daily. 90 tablet 3  . metFORMIN (GLUCOPHAGE) 1000 MG tablet TAKE 1 TABLET (1,000 MG TOTAL) BY MOUTH 2 (TWO) TIMES DAILY WITH A MEAL. 180 tablet 3  . Multiple Vitamin (MULTIVITAMIN) tablet Take 1 tablet by mouth daily.    Glory Rosebush VERIO test strip CHECKS SUGARS 4 TIMES DAILY, ON MEAL TIME INSULIN 100 strip 6  . OZEMPIC, 1 MG/DOSE, 2 MG/1.5ML SOPN INJECT 1 MG INTO THE SKIN ONCE A WEEK. 3 pen 11  . pantoprazole (PROTONIX) 40  MG tablet Take 1 tablet (40 mg total) by mouth daily. Take 30-60 min before first meal of the day 30 tablet 2  . potassium chloride (KLOR-CON) 20 MEQ packet Take 20 mEq by mouth 2 (two) times daily. (Patient taking differently: Take 20 mEq by mouth 2 (two) times daily. ) 180 each 3  . rosuvastatin (CRESTOR) 20 MG tablet TAKE 1 TABLET BY MOUTH EVERY DAY 30 tablet 0  . TRESIBA FLEXTOUCH 200 UNIT/ML FlexTouch Pen INJECT 130 UNITS INTO THE SKIN DAILY. 12 mL 2  . valsartan-hydrochlorothiazide (DIOVAN-HCT) 320-25 MG tablet TAKE 1 TABLET BY MOUTH EVERY DAY 30 tablet 1   No current facility-administered medications on file prior to visit.    Allergies  Allergen Reactions  . Penicillins Hives and Swelling    Has patient had a PCN reaction causing immediate rash, facial/tongue/throat swelling, SOB or lightheadedness with hypotension: Yes Has patient had a PCN reaction causing severe rash involving mucus membranes or skin necrosis: No Has patient had a PCN reaction that required hospitalization: No Has patient had a PCN reaction occurring within the last 10 years: No If all of the above answers are "NO", then may proceed with Cephalosporin use.    Family History  Problem Relation Age of Onset  . Heart disease Father   . Hypertension Father   . Heart disease Brother   . Cancer Neg Hx   . Stroke Neg Hx   . Diabetes Neg Hx     BP (!) 142/80   Pulse 85   Ht _0  (1.702 m)   Wt (!) 318 lb 9.6 oz (144.5 kg)    LMP 11/09/2013   SpO2 94%   BMI 49.90 kg/m    Review of Systems She denies hypoglycemia.      Objective:   Physical Exam VITAL SIGNS:  See vs page GENERAL: no distress Pulses: dorsalis pedis intact bilat.   MSK: no deformity of the feet CV: 1+ bilat leg edema Skin:  no ulcer on the feet.  normal color and temp on the feet. Neuro: sensation is intact to touch on the feet, but decreased from normal  A1c=10.9%     Assessment & Plan:  HTN: is noted today Insulin-requiring type 2 DM: uncontrolled Noncompliance with cbg recording: we discussed dosing of Tresiba   Patient Instructions  Your blood pressure is high today.  Please see your primary care provider soon, to have it rechecked If you miss the Tresiba in the morning, you can still take the full amount later in the day.   continue the same other diabetes medications.  You can take the insulin at any time of day.  Therefore, if you miss it in the morning, you can take it later in the day. check your blood sugar twice a day.  vary the time of day when you check, between before the 3 meals, and at bedtime.  also check if you have symptoms of your blood sugar being too high or too low.  please keep a record of the readings and bring it to your next appointment here (or you can bring the meter itself).  You can write it on any piece of paper.  please call us sooner if your blood sugar goes below 70, or if you have a lot of readings over 200. Please come back for a follow-up appointment in 3 months.

## 2020-01-18 ENCOUNTER — Encounter: Payer: BC Managed Care – PPO | Attending: Endocrinology | Admitting: Dietician

## 2020-01-18 ENCOUNTER — Other Ambulatory Visit: Payer: Self-pay

## 2020-01-18 ENCOUNTER — Encounter: Payer: Self-pay | Admitting: Dietician

## 2020-01-18 DIAGNOSIS — E1165 Type 2 diabetes mellitus with hyperglycemia: Secondary | ICD-10-CM | POA: Diagnosis present

## 2020-01-18 DIAGNOSIS — Z794 Long term (current) use of insulin: Secondary | ICD-10-CM | POA: Diagnosis present

## 2020-01-18 NOTE — Progress Notes (Signed)
Diabetes Self-Management Education  Visit Type: Follow-up  Appt. Start Time: 1035 Appt. End Time: 1105  01/19/2020  Amy Rosario, identified by name and date of birth, is a 59 y.o. female with a diagnosis of Diabetes:  .   ASSESSMENT Patient is here today alone.  She was last seen 10/19/2019.  She is a patient of Dr. Loanne Drilling.  Patient states that she is trying to increase her vegetable intake but struggles with this. She struggles to stop soda and juice although verbalizes the need to do so. Walking but more inconsistent (3 times last week for 30 minutes).  States that she is cleaning and painting. She states that she has been taking her medication more consistently.  History includes Type 2 diabetes dx 2015 and insulin since 2017, HLD, HTN, OSA- on cpap, sarcoidosis, goiter A1C 7.8% 6.28.21' Medications include Tresiba 150 units q am, Metformin, Ozempic  Weight hx: 318 lbs 01/18/2020 311 lbs 09/18/2019 316 lbs 08/24/2019 266 lbs 2018 291 lbs 08/02/2015 325 lbs 2016 (lost but stopping soda)  Patient lives with her sister and her sister's 62 yo son. Her sister does the cooking now. She states that her sister told her to stop cooking when she got sick in 2018 as she would leave the stove on.  She is now on disability. 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 Avoids salt. Weight (!) 318 lb (144.2 kg), last menstrual period 11/09/2013. Body mass index is 49.81 kg/m.   Diabetes Self-Management Education - 01/18/20 1400      Visit Information   Visit Type Follow-up      Pre-Education Assessment   Patient understands the diabetes disease and treatment process. Demonstrates understanding / competency    Patient understands incorporating nutritional management into lifestyle. Needs Review    Patient undertands incorporating physical activity into lifestyle. Demonstrates understanding / competency    Patient understands using medications safely.  Demonstrates understanding / competency    Patient understands monitoring blood glucose, interpreting and using results Demonstrates understanding / competency    Patient understands prevention, detection, and treatment of acute complications. Demonstrates understanding / competency    Patient understands prevention, detection, and treatment of chronic complications. Demonstrates understanding / competency    Patient understands how to develop strategies to address psychosocial issues. Needs Review    Patient understands how to develop strategies to promote health/change behavior. Needs Review      Complications   Last HgB A1C per patient/outside source 7.9 %   01/11/2020 decreased from 8.9% 11/09/2019   How often do you check your blood sugar? 3-4 times/day    Fasting Blood glucose range (mg/dL) 70-129;130-179    Have you had a dilated eye exam in the past 12 months? No      Dietary Intake   Breakfast egg biscuit, 16 oz OJ    Lunch skips    Snack (afternoon) Cheeze its    Dinner broiled chicken with skin, yellow rice, broccoli    Snack (evening) 3 donuts    Beverage(s) water, 1/2 cup regular soda, juice      Exercise   Exercise Type Light (walking / raking leaves);ADL's    How many days per week to you exercise? 4    How many minutes per day do you exercise? 30    Total minutes per week of exercise 120      Patient Education   Previous Diabetes Education Yes (please comment)   10/19/2019 RD  Subsequent Visit   Since your last visit have you continued or begun to take your medications as prescribed? Yes    Since your last visit have you had your blood pressure checked? Yes    Since your last visit have you experienced any weight changes? Gain    Weight Gain (lbs) 7    Since your last visit, are you checking your blood glucose at least once a day? No   most          Individualized Plan for Diabetes Self-Management Training:   Learning Objective:  Patient will have a greater  understanding of diabetes self-management. Patient education plan is to attend individual and/or group sessions per assessed needs and concerns.   Plan:   Patient Instructions  Recommend making an eye appointment Be mindful about your food choices.  Eat slowly and stop when satisfied. Recommend eating at the table. Continue to choose water most often.  Juice and regular soda will make your blood sugar go up. Continue to stay active.   Continue a low sodium diet. Aim for a consistent meal schedule Add more vegetables to your day.     Expected Outcomes:   Demonstrated understanding, positive outcome expected  Education material provided: My Plate Placemat  If problems or questions, patient to contact team via:  Phone  Future DSME appointment:  1-2 months

## 2020-01-18 NOTE — Patient Instructions (Signed)
Recommend making an eye appointment Be mindful about your food choices.  Eat slowly and stop when satisfied. Recommend eating at the table. Continue to choose water most often.  Juice and regular soda will make your blood sugar go up. Continue to stay active.   Continue a low sodium diet. Aim for a consistent meal schedule Add more vegetables to your day.

## 2020-02-02 ENCOUNTER — Other Ambulatory Visit: Payer: Self-pay | Admitting: Medical

## 2020-02-26 ENCOUNTER — Other Ambulatory Visit: Payer: Self-pay | Admitting: Medical

## 2020-03-14 ENCOUNTER — Ambulatory Visit: Payer: BC Managed Care – PPO | Admitting: Dietician

## 2020-03-24 ENCOUNTER — Other Ambulatory Visit: Payer: Self-pay | Admitting: Medical

## 2020-03-28 ENCOUNTER — Other Ambulatory Visit: Payer: Self-pay | Admitting: Medical

## 2020-04-02 ENCOUNTER — Other Ambulatory Visit: Payer: Self-pay | Admitting: Endocrinology

## 2020-04-07 ENCOUNTER — Other Ambulatory Visit: Payer: Self-pay

## 2020-04-11 ENCOUNTER — Other Ambulatory Visit: Payer: Self-pay

## 2020-04-11 ENCOUNTER — Ambulatory Visit: Payer: BC Managed Care – PPO | Admitting: Endocrinology

## 2020-04-11 VITALS — BP 120/90 | HR 96 | Ht 70.0 in | Wt 320.8 lb

## 2020-04-11 DIAGNOSIS — E1165 Type 2 diabetes mellitus with hyperglycemia: Secondary | ICD-10-CM | POA: Diagnosis not present

## 2020-04-11 DIAGNOSIS — Z794 Long term (current) use of insulin: Secondary | ICD-10-CM

## 2020-04-11 LAB — POCT GLYCOSYLATED HEMOGLOBIN (HGB A1C): Hemoglobin A1C: 7.7 % — AB (ref 4.0–5.6)

## 2020-04-11 NOTE — Progress Notes (Signed)
Subjective:    Patient ID: Amy Rosario, female    DOB: [birth year], 60 y.o.   MRN: [id number]  HPI Pt returns for f/u of diabetes mellitus: DM type: Insulin-requiring type 2.   Dx'ed: 2015.  Complications: PN Therapy: insulin since 2017, Ozempic, and metformin.  GDM: never DKA: never Severe hypoglycemia: never Pancreatitis: never.  Pancreatic imaging: normal on 2018 [place].  Other: she takes QD insulin, after poor results with multiple daily injections; she takes intermitt prednisone for sarcoidosis; She stopped farxiga, due to vaginal bleeding; Amy Rosario had a $0 copay.  Interval history: no recent steroids.  no cbg record, but states cbg's vary from 95-181.  She takes meds as rx'ed.   Pt also has MNG (dx'ed 2015; bx in 2020 showed Amy Rosario 3, and affirma reported insuff material; f/u US in 2020 was unchanged--no f/u needed).   Past Medical History:  Diagnosis Date  . Chronic gout    followed by pcp---  multiple sites (06-11-2019 per pt last episode fall 2020)  . H/O echocardiogram 04-04-2018  received via fax on 06-11-2019 to be scanned in   Dr. Charolette Forward, [facility]-  . Hyperlipidemia   . Hypertension 2000   managed by Dr. Terrence Dupont, cardiology  . Hypokalemia   . Hypothyroidism    endocrinologist-- dr Loanne Drilling-- s/p bx's 03-18-2018 and 07-02-2013 results in epic  . OSA on CPAP    pt states has older machine is very large  . Peripheral neuropathy   . PMB (postmenopausal bleeding)   . Sarcoidosis of lung [facility])    pulmonology-- dr wert---  s/p brochoscopy 07-05-2016, no sarcoid  (dr wert released pt as needed per lov note in epic 06-02-2018)  . Thyroid goiter   . Type 2 diabetes mellitus treated with insulin [facility])    endocrinology--- dr Loanne Drilling   (06-11-2019  per pt checks blood surgar daily in am,  fasting surgar-- 85 -- 130)  . Wears glasses     Past Surgical History:  Procedure Laterality Date  . COLONOSCOPY WITH PROPOFOL N/A 09/21/2013   Procedure:  COLONOSCOPY WITH PROPOFOL;  Surgeon: Juanita Craver, MD;  Location: [facility];  Service: Endoscopy;  Laterality: N/A;  . CYST EXCISION  teen   right lower back, benign  . DILATATION & CURETTAGE/HYSTEROSCOPY WITH MYOSURE N/A 06/16/2019   Procedure: DILATATION & CURETTAGE/HYSTEROSCOPY WITH MYOSURE/POLYPECTOMY;  Surgeon: Salvadore Dom, MD;  Location: [facility];  Service: Gynecology;  Laterality: N/A;  polypectomy  . ENDOMETRIAL BIOPSY  2021   Dr. Sumner Boast  . TUBAL LIGATION Bilateral yrs ago  . VIDEO BRONCHOSCOPY Bilateral 07/05/2016   Procedure: VIDEO BRONCHOSCOPY WITH FLUORO;  Surgeon: Tanda Rockers, MD;  Location: [facility];  Service: Endoscopy;  Laterality: Bilateral;    Social History   Socioeconomic History  . Marital status: Single    Spouse name: Not on file  . Number of children: Not on file  . Years of education: Not on file  . Highest education level: Not on file  Occupational History  . Not on file  Tobacco Use  . Smoking status: Never Smoker  . Smokeless tobacco: Never Used  Vaping Use  . Vaping Use: Never used  Substance and Sexual Activity  . Alcohol use: No  . Drug use: Never  . Sexual activity: Not on file  Other Topics Concern  . Not on file  Social History Narrative   Lives with sister Amy Rosario, single, has 3 sons.   Doing some  walking for exercise.  Prior Consulting civil engineer at [employer], visit different churches.  Awaiting disability determination.   06/2019.     Social Determinants of Health   Financial Resource Strain: Not on file  Food Insecurity: Not on file  Transportation Needs: Not on file  Physical Activity: Not on file  Stress: Not on file  Social Connections: Not on file  Intimate Partner Violence: Not on file    Current Outpatient Medications on File Prior to Visit  Medication Sig Dispense Refill  . allopurinol (ZYLOPRIM) 300 MG tablet Take 1 tablet (300 mg total) by mouth daily. (Patient taking  differently: Take 300 mg by mouth daily.) 90 tablet 3  . amLODipine (NORVASC) 10 MG tablet TAKE 1 TABLET BY MOUTH EVERY DAY 30 tablet 0  . aspirin 81 MG EC tablet TAKE 1 TABLET BY MOUTH EVERY DAY 30 tablet 0  . bisoprolol (ZEBETA) 10 MG tablet TAKE 1 TABLET BY MOUTH EVERY DAY 30 tablet 0  . Blood Glucose Monitoring Suppl (ONE TOUCH ULTRA 2) w/Device KIT 1 Device by Does not apply route 2 (two) times daily. 1 each 0  . cholecalciferol (VITAMIN D3) 25 MCG (1000 UNIT) tablet TAKE 1 TABLET BY MOUTH EVERY DAY 90 tablet 0  . cloNIDine (CATAPRES) 0.2 MG tablet TAKE 1 TABLET (0.2 MG TOTAL) BY MOUTH 3 (THREE) TIMES DAILY. 270 tablet 1  . famotidine (PEPCID) 20 MG tablet TAKE 1 TABLET BY MOUTH EVERYDAY AT BEDTIME 90 tablet 3  . gabapentin (NEURONTIN) 100 MG capsule TAKE 1 CAPSULE (100 MG TOTAL) BY MOUTH AT BEDTIME. 90 capsule 3  . Insulin Pen Needle (BD PEN NEEDLE NANO U/F) 32G X 4 MM MISC USE 4X A DAY 200 each 6  . Lancets (ONETOUCH DELICA PLUS [id number]) MISC USE TWICE A DAY 100 each 2  . levothyroxine (SYNTHROID) 50 MCG tablet Take 1 tablet (50 mcg total) by mouth daily. 90 tablet 3  . metFORMIN (GLUCOPHAGE) 1000 MG tablet TAKE 1 TABLET (1,000 MG TOTAL) BY MOUTH 2 (TWO) TIMES DAILY WITH A MEAL. 180 tablet 3  . Multiple Vitamin (MULTIVITAMIN) tablet Take 1 tablet by mouth daily.    Glory Rosebush VERIO test strip CHECKS SUGARS 4 TIMES DAILY, ON MEAL TIME INSULIN 100 strip 6  . OZEMPIC, 1 MG/DOSE, 2 MG/1.5ML SOPN INJECT 1 MG INTO THE SKIN ONCE A WEEK. 3 pen 11  . pantoprazole (PROTONIX) 40 MG tablet Take 1 tablet (40 mg total) by mouth daily. Take 30-60 min before first meal of the day 30 tablet 2  . potassium chloride (KLOR-CON) 20 MEQ packet Take 20 mEq by mouth 2 (two) times daily. (Patient taking differently: Take 20 mEq by mouth 2 (two) times daily.) 180 each 3  . rosuvastatin (CRESTOR) 20 MG tablet TAKE 1 TABLET BY MOUTH EVERY DAY 30 tablet 0  . TRESIBA FLEXTOUCH 200 UNIT/ML FlexTouch Pen INJECT 130  UNITS INTO THE SKIN DAILY. 18 mL 2  . valsartan-hydrochlorothiazide (DIOVAN-HCT) 320-25 MG tablet TAKE 1 TABLET BY MOUTH EVERY DAY 30 tablet 1   No current facility-administered medications on file prior to visit.    Allergies  Allergen Reactions  . Penicillins Hives and Swelling    Has patient had a PCN reaction causing immediate rash, facial/tongue/throat swelling, SOB or lightheadedness with hypotension: Yes Has patient had a PCN reaction causing severe rash involving mucus membranes or skin necrosis: No Has patient had a PCN reaction that required hospitalization: No Has patient had a PCN reaction occurring within the last  10 years: No If all of the above answers are "NO", then may proceed with Cephalosporin use.    Family History  Problem Relation Age of Onset  . Heart disease Father   . Hypertension Father   . Heart disease Brother   . Cancer Neg Hx   . Stroke Neg Hx   . Diabetes Neg Hx     BP 120/90 (BP Location: Right Arm, Patient Position: Sitting, Cuff Size: Large)   Pulse 96   Ht 5' 10"  (1.778 m)   Wt (!) 320 lb 12.8 oz (145.5 kg)   LMP 11/09/2013   SpO2 92%   BMI 46.03 kg/m    Review of Systems She denies hypoglycemia    Objective:   Physical Exam VITAL SIGNS:  See vs page GENERAL: no distress Pulses: dorsalis pedis intact bilat.   MSK: no deformity of the feet CV: 1+ bilat leg edema Skin:  no ulcer on the feet.  normal color and temp on the feet. Neuro: sensation is intact to touch on the feet, but decreased from normal  Lab Results  Component Value Date   HGBA1C 7.7 (A) 04/11/2020       Assessment & Plan:  Insulin-requiring type 2 DM, with PN: this is the best control this pt should aim for, given this regimen, which does match insulin to her changing needs throughout the day  Patient Instructions  If you miss the Tresiba in the morning, you can still take the full amount later in the day.   continue the same diabetes medications.  You can  take the insulin at any time of day.  Therefore, if you miss it in the morning, you can take it later in the day. check your blood sugar twice a day.  vary the time of day when you check, between before the 3 meals, and at bedtime.  also check if you have symptoms of your blood sugar being too high or too low.  please keep a record of the readings and bring it to your next appointment here (or you can bring the meter itself).  You can write it on any piece of paper.  please call us sooner if your blood sugar goes below 70, or if you have a lot of readings over 200. Please come back for a follow-up appointment in 4 months.

## 2020-04-11 NOTE — Patient Instructions (Addendum)
If you miss the Tresiba in the morning, you can still take the full amount later in the day.   continue the same diabetes medications.  You can take the insulin at any time of day.  Therefore, if you miss it in the morning, you can take it later in the day. check your blood sugar twice a day.  vary the time of day when you check, between before the 3 meals, and at bedtime.  also check if you have symptoms of your blood sugar being too high or too low.  please keep a record of the readings and bring it to your next appointment here (or you can bring the meter itself).  You can write it on any piece of paper.  please call us sooner if your blood sugar goes below 70, or if you have a lot of readings over 200. Please come back for a follow-up appointment in 4 months.

## 2020-04-19 ENCOUNTER — Other Ambulatory Visit: Payer: Self-pay | Admitting: Medical

## 2020-04-19 DIAGNOSIS — M79672 Pain in left foot: Secondary | ICD-10-CM

## 2020-04-19 DIAGNOSIS — M792 Neuralgia and neuritis, unspecified: Secondary | ICD-10-CM

## 2020-04-19 DIAGNOSIS — M79671 Pain in right foot: Secondary | ICD-10-CM

## 2020-04-19 DIAGNOSIS — E119 Type 2 diabetes mellitus without complications: Secondary | ICD-10-CM

## 2020-04-25 ENCOUNTER — Other Ambulatory Visit: Payer: Self-pay | Admitting: Medical

## 2020-05-04 NOTE — Progress Notes (Signed)
60 y.o. [id number] Single Black or African American Not Hispanic or Latino female here for annual exam.    Last year she had a hysteroscopy, polypectomy, D&C with benign pathology. No further bleeding.   No dyspareunia. No bowel or bladder c/o.     H/O HTN, elevated lipids, goiter, elevated BMI and DM. Last HgbA1C was 7.7  She stopped drinking soda and juice. She is working with a Microbiologist. Exercising.   Patient's last menstrual period was 11/09/2013.          Sexually active: Yes.    The current method of family planning is post menopausal status.    Exercising: Yes.    squates , reaching hands and walking Smoker:  no  Health Maintenance: Pap: 04/29/19 WNL 03/21/2017 WNL NEG HR HPV,03-20-16 WNL NEG HR HPV,05-27-13 LGSIL NEG HR HPV  History of abnormal Pap:  yes MMG:  07/20/19 density B Bi-rads 1 neg  BMD:  Never  Colonoscopy: 09/21/13 TDaP:  05/27/13  Gardasil: NA   reports that she has never smoked. She has never used smokeless tobacco. She reports that she does not drink alcohol and does not use drugs. She ison disability. 3 grown kids (all boys),14grand kids  Past Medical History:  Diagnosis Date  . Chronic gout    followed by pcp---  multiple sites (06-11-2019 per pt last episode fall 2020)  . H/O echocardiogram 04-04-2018  received via fax on 06-11-2019 to be scanned in   Dr. Charolette Forward, [facility]-  . Hyperlipidemia   . Hypertension 2000   managed by Dr. Terrence Dupont, cardiology  . Hypokalemia   . Hypothyroidism    endocrinologist-- dr Loanne Drilling-- s/p bx's 03-18-2018 and 07-02-2013 results in epic  . OSA on CPAP    pt states has older machine is very large  . Peripheral neuropathy   . PMB (postmenopausal bleeding)   . Sarcoidosis of lung [facility])    pulmonology-- dr wert---  s/p brochoscopy 07-05-2016, no sarcoid  (dr wert released pt as needed per lov note in epic 06-02-2018)  . Thyroid goiter   . Type 2 diabetes mellitus treated with insulin [facility])     endocrinology--- dr Loanne Drilling   (06-11-2019  per pt checks blood surgar daily in am,  fasting surgar-- 85 -- 130)  . Wears glasses     Past Surgical History:  Procedure Laterality Date  . COLONOSCOPY WITH PROPOFOL N/A 09/21/2013   Procedure: COLONOSCOPY WITH PROPOFOL;  Surgeon: Juanita Craver, MD;  Location: [facility];  Service: Endoscopy;  Laterality: N/A;  . CYST EXCISION  teen   right lower back, benign  . DILATATION & CURETTAGE/HYSTEROSCOPY WITH MYOSURE N/A 06/16/2019   Procedure: DILATATION & CURETTAGE/HYSTEROSCOPY WITH MYOSURE/POLYPECTOMY;  Surgeon: Salvadore Dom, MD;  Location: [facility];  Service: Gynecology;  Laterality: N/A;  polypectomy  . ENDOMETRIAL BIOPSY  2021   Dr. Sumner Boast  . TUBAL LIGATION Bilateral yrs ago  . VIDEO BRONCHOSCOPY Bilateral 07/05/2016   Procedure: VIDEO BRONCHOSCOPY WITH FLUORO;  Surgeon: Tanda Rockers, MD;  Location: [facility];  Service: Endoscopy;  Laterality: Bilateral;    Current Outpatient Medications  Medication Sig Dispense Refill  . allopurinol (ZYLOPRIM) 300 MG tablet Take 1 tablet (300 mg total) by mouth daily. (Patient taking differently: Take 300 mg by mouth daily.) 90 tablet 3  . amLODipine (NORVASC) 10 MG tablet TAKE 1 TABLET BY MOUTH EVERY DAY 30 tablet 0  . ASPIRIN LOW DOSE 81 MG EC tablet TAKE 1 TABLET BY MOUTH  EVERY DAY 30 tablet 0  . bisoprolol (ZEBETA) 10 MG tablet TAKE 1 TABLET BY MOUTH EVERY DAY 30 tablet 0  . Blood Glucose Monitoring Suppl (ONE TOUCH ULTRA 2) w/Device KIT 1 Device by Does not apply route 2 (two) times daily. 1 each 0  . cholecalciferol (VITAMIN D3) 25 MCG (1000 UNIT) tablet TAKE 1 TABLET BY MOUTH EVERY DAY 90 tablet 0  . cloNIDine (CATAPRES) 0.2 MG tablet TAKE 1 TABLET (0.2 MG TOTAL) BY MOUTH 3 (THREE) TIMES DAILY. 270 tablet 1  . famotidine (PEPCID) 20 MG tablet TAKE 1 TABLET BY MOUTH EVERYDAY AT BEDTIME 90 tablet 3  . gabapentin (NEURONTIN) 100 MG capsule TAKE 1 CAPSULE (100 MG  TOTAL) BY MOUTH AT BEDTIME. 90 capsule 3  . Insulin Pen Needle (BD PEN NEEDLE NANO U/F) 32G X 4 MM MISC USE 4X A DAY 200 each 6  . Lancets (ONETOUCH DELICA PLUS [id number]) MISC USE TWICE A DAY 100 each 2  . levothyroxine (SYNTHROID) 50 MCG tablet Take 1 tablet (50 mcg total) by mouth daily. 90 tablet 3  . metFORMIN (GLUCOPHAGE) 1000 MG tablet TAKE 1 TABLET (1,000 MG TOTAL) BY MOUTH 2 (TWO) TIMES DAILY WITH A MEAL. 180 tablet 3  . Multiple Vitamin (MULTIVITAMIN) tablet Take 1 tablet by mouth daily.    Glory Rosebush VERIO test strip CHECKS SUGARS 4 TIMES DAILY, ON MEAL TIME INSULIN 100 strip 6  . OZEMPIC, 1 MG/DOSE, 2 MG/1.5ML SOPN INJECT 1 MG INTO THE SKIN ONCE A WEEK. 3 pen 11  . pantoprazole (PROTONIX) 40 MG tablet Take 1 tablet (40 mg total) by mouth daily. Take 30-60 min before first meal of the day 30 tablet 2  . potassium chloride (KLOR-CON) 20 MEQ packet Take 20 mEq by mouth 2 (two) times daily. (Patient taking differently: Take 20 mEq by mouth 2 (two) times daily.) 180 each 3  . rosuvastatin (CRESTOR) 20 MG tablet TAKE 1 TABLET BY MOUTH EVERY DAY 30 tablet 0  . TRESIBA FLEXTOUCH 200 UNIT/ML FlexTouch Pen INJECT 130 UNITS INTO THE SKIN DAILY. 18 mL 2  . valsartan-hydrochlorothiazide (DIOVAN-HCT) 320-25 MG tablet TAKE 1 TABLET BY MOUTH EVERY DAY 30 tablet 0   No current facility-administered medications for this visit.    Family History  Problem Relation Age of Onset  . Heart disease Father   . Hypertension Father   . Heart disease Brother   . Cancer Neg Hx   . Stroke Neg Hx   . Diabetes Neg Hx     Review of Systems  All other systems reviewed and are negative.   Exam:   BP 132/70   Pulse 65   Ht _0  (1.778 m)   Wt (!) 319 lb (144.7 kg)   LMP 11/09/2013   SpO2 100%   BMI 45.77 kg/m   Weight change: _1 @ Height:   Height: _2  (177.8 cm)  Ht Readings from Last 3 Encounters:  05/05/20 _3  (1.778 m)  04/11/20 _4  (1.778 m)  01/11/20 _5  (1.702 m)     General appearance: alert, cooperative and appears stated age Head: Normocephalic, without obvious abnormality, atraumatic Neck: no adenopathy, supple, symmetrical, trachea midline and thyroid enlarged and thyromegaly Lungs: clear to auscultation bilaterally Cardiovascular: regular rate and rhythm Breasts: normal appearance, no masses or tenderness Abdomen: soft, non-tender; non distended,  no masses,  no organomegaly Extremities: extremities normal, atraumatic, no cyanosis or edema Skin: Skin color, texture, turgor normal. No rashes or lesions Lymph nodes: Cervical, supraclavicular,  and axillary nodes normal. No abnormal inguinal nodes palpated Neurologic: Grossly normal   Pelvic: External genitalia:  no lesions              Urethra:  normal appearing urethra with no masses, tenderness or lesions              Bartholins and Skenes: normal                 Vagina: normal appearing vagina with normal color and discharge, no lesions              Cervix: no lesions               Bimanual Exam:  Uterus:  anteverted, mobile, no masses or tenderness               Adnexa: no mass, fullness, tenderness               Rectovaginal: Confirms               Anus:  normal sphincter tone, no lesions  Gae Dry chaperoned for the exam.  1. Well woman exam Discussed breast self exam Discussed calcium and vit D intake Labs with primary Mammogram in June No pap this year Colonoscopy UTD  2. BMI 45.0-49.9, [facility]) She is working with a Microbiologist

## 2020-05-05 ENCOUNTER — Encounter: Payer: Self-pay | Admitting: Obstetrics and Gynecology

## 2020-05-05 ENCOUNTER — Other Ambulatory Visit: Payer: Self-pay

## 2020-05-05 ENCOUNTER — Ambulatory Visit (INDEPENDENT_AMBULATORY_CARE_PROVIDER_SITE_OTHER): Payer: BC Managed Care – PPO | Admitting: Obstetrics and Gynecology

## 2020-05-05 VITALS — BP 132/70 | HR 65 | Ht 70.0 in | Wt 319.0 lb

## 2020-05-05 DIAGNOSIS — Z01419 Encounter for gynecological examination (general) (routine) without abnormal findings: Secondary | ICD-10-CM

## 2020-05-05 DIAGNOSIS — Z6841 Body Mass Index (BMI) 40.0 and over, adult: Secondary | ICD-10-CM | POA: Diagnosis not present

## 2020-05-05 NOTE — Patient Instructions (Signed)

## 2020-05-13 ENCOUNTER — Other Ambulatory Visit: Payer: Self-pay | Admitting: Medical

## 2020-05-24 ENCOUNTER — Other Ambulatory Visit: Payer: Self-pay | Admitting: Medical

## 2020-06-22 ENCOUNTER — Other Ambulatory Visit: Payer: Self-pay | Admitting: Medical

## 2020-06-23 ENCOUNTER — Other Ambulatory Visit: Payer: Self-pay | Admitting: Medical

## 2020-07-15 ENCOUNTER — Other Ambulatory Visit: Payer: Self-pay | Admitting: Endocrinology

## 2020-07-22 ENCOUNTER — Other Ambulatory Visit: Payer: Self-pay | Admitting: Medical

## 2020-08-15 ENCOUNTER — Other Ambulatory Visit: Payer: Self-pay

## 2020-08-15 ENCOUNTER — Ambulatory Visit (INDEPENDENT_AMBULATORY_CARE_PROVIDER_SITE_OTHER): Payer: BC Managed Care – PPO | Admitting: Endocrinology

## 2020-08-15 VITALS — BP 150/80 | HR 81 | Ht 70.0 in | Wt 309.2 lb

## 2020-08-15 DIAGNOSIS — Z794 Long term (current) use of insulin: Secondary | ICD-10-CM | POA: Diagnosis not present

## 2020-08-15 DIAGNOSIS — E1165 Type 2 diabetes mellitus with hyperglycemia: Secondary | ICD-10-CM

## 2020-08-15 LAB — POCT GLYCOSYLATED HEMOGLOBIN (HGB A1C): Hemoglobin A1C: 6.7 % — AB (ref 4.0–5.6)

## 2020-08-15 MED ORDER — TRESIBA FLEXTOUCH 200 UNIT/ML ~~LOC~~ SOPN: 140.0000 [IU] | PEN_INJECTOR | Freq: Every day | SUBCUTANEOUS | 2 refills | Status: DC

## 2020-08-15 NOTE — Progress Notes (Signed)
Subjective:    Patient ID: Amy Rosario, female    DOB: Sep 15, 1960, 60 y.o.   MRN: [id number]  HPI Pt returns for f/u of diabetes mellitus: DM type: Insulin-requiring type 2.   Dx'ed: 2015.  Complications: PN Therapy: insulin since 2017, Ozempic, and metformin.  GDM: never DKA: never Severe hypoglycemia: never Pancreatitis: never.  Pancreatic imaging: normal on 2018 [place].  Other: she takes QD insulin, after poor results with multiple daily injections; she takes intermitt prednisone for sarcoidosis; She stopped farxiga, due to vaginal bleeding; Tyler Aas had a $0 copay.  Interval history: no recent steroids.  no cbg record, but states cbg's vary from 84-138.  She takes meds as rx'ed.  She takes 150 units qd.  Pt also has MNG (dx'ed 2015; bx in 2020 showed Beth Cat 3, and affirma reported insuff material; f/u US in 2020 was unchanged--no f/u needed).   Past Medical History:  Diagnosis Date   Chronic gout    followed by pcp---  multiple sites (06-11-2019 per pt last episode fall 2020)   H/O echocardiogram 04-04-2018  received via fax on 06-11-2019 to be scanned in   Dr. Charolette Forward, [facility]-   Hyperlipidemia    Hypertension 2000   managed by Dr. Terrence Dupont, cardiology   Hypokalemia    Hypothyroidism    endocrinologist-- dr Loanne Drilling-- s/p bx's 03-18-2018 and 07-02-2013 results in epic   OSA on CPAP    pt states has older machine is very large   Peripheral neuropathy    PMB (postmenopausal bleeding)    Sarcoidosis of lung [facility])    pulmonology-- dr wert---  s/p brochoscopy 07-05-2016, no sarcoid  (dr wert released pt as needed per lov note in epic 06-02-2018)   Thyroid goiter    Type 2 diabetes mellitus treated with insulin [facility])    endocrinology--- dr Loanne Drilling   (06-11-2019  per pt checks blood surgar daily in am,  fasting surgar-- 85 -- 130)   Wears glasses     Past Surgical History:  Procedure Laterality Date   COLONOSCOPY WITH PROPOFOL N/A 09/21/2013    Procedure: COLONOSCOPY WITH PROPOFOL;  Surgeon: Juanita Craver, MD;  [facility];  Service: Endoscopy;  Laterality: N/A;   CYST EXCISION  teen   right lower back, benign   DILATATION & CURETTAGE/HYSTEROSCOPY WITH MYOSURE N/A 06/16/2019   Procedure: DILATATION & CURETTAGE/HYSTEROSCOPY WITH MYOSURE/POLYPECTOMY;  Surgeon: Salvadore Dom, MD;  Location: [place];  Service: Gynecology;  Laterality: N/A;  polypectomy   ENDOMETRIAL BIOPSY  2021   Dr. Sumner Boast   TUBAL LIGATION Bilateral yrs ago   VIDEO BRONCHOSCOPY Bilateral 07/05/2016   Procedure: [place];  Surgeon: Tanda Rockers, MD;  Location: [facility];  Service: Endoscopy;  Laterality: Bilateral;    Social History   Socioeconomic History   Marital status: Single    Spouse name: Not on file   Number of children: Not on file   Years of education: Not on file   Highest education level: Not on file  Occupational History   Not on file  Tobacco Use   Smoking status: Never   Smokeless tobacco: Never  Vaping Use   Vaping Use: Never used  Substance and Sexual Activity   Alcohol use: No   Drug use: Never   Sexual activity: Not on file  Other Topics Concern   Not on file  Social History Narrative   Lives with sister [name], single, has 3 sons.  Doing some walking for exercise.  Prior Consulting civil engineer at [employer], visit different churches.  Awaiting disability determination.   06/2019.     Social Determinants of Health   Financial Resource Strain: Not on file  Food Insecurity: Not on file  Transportation Needs: Not on file  Physical Activity: Not on file  Stress: Not on file  Social Connections: Not on file  Intimate Partner Violence: Not on file    Current Outpatient Medications on File Prior to Visit  Medication Sig Dispense Refill   allopurinol (ZYLOPRIM) 300 MG tablet TAKE 1 TABLET BY MOUTH EVERY DAY 90 tablet 0   amLODipine (NORVASC) 10 MG tablet TAKE  1 TABLET BY MOUTH EVERY DAY 30 tablet 0   aspirin 81 MG EC tablet TAKE 1 TABLET BY MOUTH EVERY DAY 30 tablet 0   bisoprolol (ZEBETA) 10 MG tablet TAKE 1 TABLET BY MOUTH EVERY DAY 30 tablet 0   Blood Glucose Monitoring Suppl (ONE TOUCH ULTRA 2) w/Device KIT 1 Device by Does not apply route 2 (two) times daily. 1 each 0   cholecalciferol (VITAMIN D3) 25 MCG (1000 UNIT) tablet TAKE 1 TABLET BY MOUTH EVERY DAY 90 tablet 0   cloNIDine (CATAPRES) 0.2 MG tablet TAKE 1 TABLET (0.2 MG TOTAL) BY MOUTH 3 (THREE) TIMES DAILY. 270 tablet 1   famotidine (PEPCID) 20 MG tablet TAKE 1 TABLET BY MOUTH EVERYDAY AT BEDTIME 90 tablet 3   gabapentin (NEURONTIN) 100 MG capsule TAKE 1 CAPSULE (100 MG TOTAL) BY MOUTH AT BEDTIME. 90 capsule 3   Insulin Pen Needle (BD PEN NEEDLE NANO U/F) 32G X 4 MM MISC USE 4X A DAY 200 each 6   Lancets (ONETOUCH DELICA PLUS [id number]) MISC USE TWICE A DAY 100 each 2   levothyroxine (SYNTHROID) 50 MCG tablet Take 1 tablet (50 mcg total) by mouth daily. 90 tablet 3   metFORMIN (GLUCOPHAGE) 1000 MG tablet TAKE 1 TABLET (1,000 MG TOTAL) BY MOUTH 2 (TWO) TIMES DAILY WITH A MEAL. 180 tablet 3   Multiple Vitamin (MULTIVITAMIN) tablet Take 1 tablet by mouth daily.     ONETOUCH VERIO test strip CHECKS SUGARS 4 TIMES DAILY, ON MEAL TIME INSULIN 100 strip 6   OZEMPIC, 1 MG/DOSE, 2 MG/1.5ML SOPN INJECT 1 MG INTO THE SKIN ONCE A WEEK. 3 pen 11   pantoprazole (PROTONIX) 40 MG tablet Take 1 tablet (40 mg total) by mouth daily. Take 30-60 min before first meal of the day 30 tablet 2   potassium chloride (KLOR-CON) 20 MEQ packet Take 20 mEq by mouth 2 (two) times daily. (Patient taking differently: Take 20 mEq by mouth 2 (two) times daily.) 180 each 3   rosuvastatin (CRESTOR) 20 MG tablet TAKE 1 TABLET BY MOUTH EVERY DAY 30 tablet 0   valsartan-hydrochlorothiazide (DIOVAN-HCT) 320-25 MG tablet TAKE 1 TABLET BY MOUTH EVERY DAY 30 tablet 0   No current facility-administered medications on file prior to  visit.     Family History  Problem Relation Age of Onset   Heart disease Father    Hypertension Father    Heart disease Brother    Cancer Neg Hx    Stroke Neg Hx    Diabetes Neg Hx     BP (!) 150/80 (BP Location: Right Arm, Patient Position: Sitting, Cuff Size: Large)   Pulse 81   Ht 5' 10"  (1.778 m)   Wt (!) 309 lb 3.2 oz (140.3 kg)   LMP 11/09/2013   SpO2 95%   BMI 44.37  kg/m    Review of Systems     Objective:   Physical Exam Pulses: dorsalis pedis intact bilat.   MSK: no deformity of the feet CV: trace bilat leg edema.   Skin:  no ulcer on the feet.  normal color and temp on the feet. Neuro: sensation is intact to touch on the feet, but decreased from normal.     Lab Results  Component Value Date   HGBA1C 6.7 (A) 08/15/2020   Lab Results  Component Value Date   CREATININE 1.20 (H) 06/16/2019   BUN 15 06/16/2019   NA 143 06/16/2019   K 2.9 (L) 06/16/2019   CL 97 (L) 06/16/2019   CO2 33 (H) 05/11/2019      Assessment & Plan:  Insulin-requiring type 2 DM: overcontrolled  Patient Instructions  Please reduce the Tresiba to 140 units per day, and continue the same other diabetes medications.  You can take the insulin at any time of day.  Therefore, if you miss it in the morning, you can take it later in the day. check your blood sugar twice a day.  vary the time of day when you check, between before the 3 meals, and at bedtime.  also check if you have symptoms of your blood sugar being too high or too low.  please keep a record of the readings and bring it to your next appointment here (or you can bring the meter itself).  You can write it on any piece of paper.  please call us sooner if your blood sugar goes below 70, or if you have a lot of readings over 200.   Please come back for a follow-up appointment in 3 months.

## 2020-08-15 NOTE — Patient Instructions (Addendum)
Please reduce the Tresiba to 140 units per day, and continue the same other diabetes medications.  You can take the insulin at any time of day.  Therefore, if you miss it in the morning, you can take it later in the day. check your blood sugar twice a day.  vary the time of day when you check, between before the 3 meals, and at bedtime.  also check if you have symptoms of your blood sugar being too high or too low.  please keep a record of the readings and bring it to your next appointment here (or you can bring the meter itself).  You can write it on any piece of paper.  please call us sooner if your blood sugar goes below 70, or if you have a lot of readings over 200.   Please come back for a follow-up appointment in 3 months.

## 2020-09-14 ENCOUNTER — Encounter: Payer: Self-pay | Admitting: Internal Medicine

## 2020-09-18 ENCOUNTER — Other Ambulatory Visit: Payer: Self-pay | Admitting: Endocrinology

## 2020-09-28 ENCOUNTER — Other Ambulatory Visit: Payer: Self-pay | Admitting: Medical

## 2020-09-28 NOTE — Telephone Encounter (Signed)
Pt has an appt September 12

## 2020-10-17 ENCOUNTER — Other Ambulatory Visit: Payer: Self-pay

## 2020-10-17 ENCOUNTER — Ambulatory Visit
Admission: RE | Admit: 2020-10-17 | Discharge: 2020-10-17 | Disposition: A | Discharge status: Home / Self Care | Payer: BC Managed Care – PPO | Source: Ambulatory Visit | Attending: Medical | Admitting: Medical

## 2020-10-17 ENCOUNTER — Ambulatory Visit (INDEPENDENT_AMBULATORY_CARE_PROVIDER_SITE_OTHER): Payer: BC Managed Care – PPO | Admitting: Medical

## 2020-10-17 VITALS — BP 122/78 | HR 84 | Wt 297.4 lb

## 2020-10-17 DIAGNOSIS — Z794 Long term (current) use of insulin: Secondary | ICD-10-CM

## 2020-10-17 DIAGNOSIS — R748 Abnormal levels of other serum enzymes: Secondary | ICD-10-CM

## 2020-10-17 DIAGNOSIS — I10 Essential (primary) hypertension: Secondary | ICD-10-CM

## 2020-10-17 DIAGNOSIS — W19XXXA Unspecified fall, initial encounter: Secondary | ICD-10-CM | POA: Diagnosis not present

## 2020-10-17 DIAGNOSIS — M2550 Pain in unspecified joint: Secondary | ICD-10-CM

## 2020-10-17 DIAGNOSIS — Z9989 Dependence on other enabling machines and devices: Secondary | ICD-10-CM

## 2020-10-17 DIAGNOSIS — R42 Dizziness and giddiness: Secondary | ICD-10-CM | POA: Diagnosis not present

## 2020-10-17 DIAGNOSIS — M79602 Pain in left arm: Secondary | ICD-10-CM

## 2020-10-17 DIAGNOSIS — E1165 Type 2 diabetes mellitus with hyperglycemia: Secondary | ICD-10-CM

## 2020-10-17 DIAGNOSIS — M2540 Effusion, unspecified joint: Secondary | ICD-10-CM

## 2020-10-17 DIAGNOSIS — Z23 Encounter for immunization: Secondary | ICD-10-CM

## 2020-10-17 DIAGNOSIS — Z862 Personal history of diseases of the blood and blood-forming organs and certain disorders involving the immune mechanism: Secondary | ICD-10-CM

## 2020-10-17 DIAGNOSIS — E039 Hypothyroidism, unspecified: Secondary | ICD-10-CM | POA: Insufficient documentation

## 2020-10-17 DIAGNOSIS — M25512 Pain in left shoulder: Secondary | ICD-10-CM

## 2020-10-17 DIAGNOSIS — E049 Nontoxic goiter, unspecified: Secondary | ICD-10-CM

## 2020-10-17 DIAGNOSIS — M1A9XX0 Chronic gout, unspecified, without tophus (tophi): Secondary | ICD-10-CM

## 2020-10-17 DIAGNOSIS — E785 Hyperlipidemia, unspecified: Secondary | ICD-10-CM | POA: Diagnosis not present

## 2020-10-17 DIAGNOSIS — E559 Vitamin D deficiency, unspecified: Secondary | ICD-10-CM

## 2020-10-17 DIAGNOSIS — E040 Nontoxic diffuse goiter: Secondary | ICD-10-CM

## 2020-10-17 DIAGNOSIS — G4733 Obstructive sleep apnea (adult) (pediatric): Secondary | ICD-10-CM

## 2020-10-17 MED ORDER — MECLIZINE HCL 25 MG PO TABS: 25.0000 mg | ORAL_TABLET | Freq: Two times a day (BID) | ORAL | 0 refills | Status: DC

## 2020-10-17 NOTE — Patient Instructions (Signed)
Pain in left shoulder, left upper arm and decreased range of motion Go for x-rays You can use Tylenol for now as needed over-the-counter. Consider using arm sling over-the-counter for the left arm for several hours at a time to rest the arm since you are so tender today We will likely need to refer you to orthopedics given the findings  Please go to [place] for your left shoulder and upper arm xrays.   Their hours are 8am - 4:30 pm Monday - Friday.  Take your insurance card with you.  [facility] [phone]  [address], [place], [place] [address]  [address], [place] [address]   Fall it sounds like your recent fall was partly due to vertigo but I am concerned about your blood pressure dropping too low.  I will forward a copy of my notes today to your cardiologist in case he wants to adjust some of your medications or see you back   Vertigo Hydrate well with water daily I am referring you to vestibular rehab to do special therapy to help vertigo and dizziness symptoms Begin meclizine twice daily to help with vertigo symptoms as well.  This is an oral tablet.  It can make you potentially sleepy so be a little cautious with this   Given some of the joint pain and swelling in your fingers and hands and pain in the fingers I am checking some additional labs for screening.  You may end up needing to see orthopedics or rheumatologist   High blood pressure You noted today that you have been out of your clonidine He also noted today that your blood pressures have run as low as 98/75.  We really do not want your blood pressure going this low particular in light of the dizziness I am going to send a copy of this to your cardiologist in case they want to lower the dose of one of your medications or make some adjustments   Diabetes Continue your medications and follow-up with Dr. Loanne Drilling as planned   High cholesterol-continue current  medications, updated labs today   Hypothyroidism-continue current medication and we will update labs today   Sleep apnea-continue CPAP therapy   Elevated alkaline phosphatase and low vitamin D I am rechecking these labs today The assumption is that your alkaline phosphatase is related to the low vitamin D Continue vitamin D supplement   History of gout and elevated uric acid-I am rechecking your uric acid blood test today

## 2020-10-17 NOTE — Progress Notes (Signed)
Subjetive: Chief Complaint  Patient presents with   med check    Med check. Not sure if gout or Arthitis is flaring up. Having pain in both arms and going up to left shoulder   Medical team Dr. Terrence Dupont, cardiology Dr. Renato Shin and Dr. Philemon Kingdom, endocrinology Dr. Christinia Gully, pulmonology  Dr. Sumner Boast, gynecology Darcee Dekker, Camelia Eng, PA-C here for primary care  Here for med check and several concerns.  Hypothyroidism-compliant with medication without issue  Hyperlipidemia-compliant with medication, needs refills, here for med check and labs  Diabetes-she sees Dr. Loanne Drilling specialist for diabetes.  Compliant with medication.  Blood sugars have been running in the acceptable range of late per her report  Sleep apnea-compliant with CPAP  She has some acute new issues.  She notes falling forward about a month ago.  Gets dizzy a lot.  That particular day a month ago felt dizzy and fell forward onto the floor in her house.  That day did not hit head or no LOC.   No other falls in last year.  Her sister helped her up that day.  Dizzy episodes lasts for seconds.   Dizziness is intermittent but almost every day.   No numbness, no tingling.   Gets room spinning sensation.  No slurred speech, no confusion, no loss of vision or hearing, no chest pain or palpitations.Home BPs usually 120/70 , sometimes as low as 850 Systolic.  She notes that the lowest pressure readings she has seen recently was 98/76.    She just saw cardiology recently but she did not mention the dizziness  She notes pains in left arm and both hands.  Pains x about a week.  Although she fell a month ago, she has not really complained much about pain until a week or so ago.  She can barely move her right shoulder, has pain.  Her hands hurt mainly in the finger knuckles with some swelling of both hands and the knuckles.  No wrist pain or swelling in the wrist  Chronic gout and elevated uric acid-compliant with  allopurinol  No other aggravating or relieving factors. No other complaint.   Past Medical History:  Diagnosis Date   Chronic gout    followed by pcp---  multiple sites (06-11-2019 per pt last episode fall 2020)   H/O echocardiogram 04-04-2018  received via fax on 06-11-2019 to be scanned in   Dr. Charolette Forward, [facility]-   Hyperlipidemia    Hypertension 2000   managed by Dr. Terrence Dupont, cardiology   Hypokalemia    Hypothyroidism    endocrinologist-- dr Loanne Drilling-- s/p bx's 03-18-2018 and 07-02-2013 results in epic   OSA on CPAP    pt states has older machine is very large   Peripheral neuropathy    PMB (postmenopausal bleeding)    Sarcoidosis of lung [facility])    pulmonology-- dr wert---  s/p brochoscopy 07-05-2016, no sarcoid  (dr wert released pt as needed per lov note in epic 06-02-2018)   Thyroid goiter    Type 2 diabetes mellitus treated with insulin [facility])    endocrinology--- dr Loanne Drilling   (06-11-2019  per pt checks blood surgar daily in am,  fasting surgar-- 85 -- 130)   Wears glasses    Current Outpatient Medications on File Prior to Visit  Medication Sig Dispense Refill   allopurinol (ZYLOPRIM) 300 MG tablet TAKE 1 TABLET BY MOUTH EVERY DAY 90 tablet 0   amLODipine (NORVASC) 10 MG tablet TAKE 1 TABLET BY  MOUTH EVERY DAY 30 tablet 0   aspirin 81 MG EC tablet TAKE 1 TABLET BY MOUTH EVERY DAY 30 tablet 0   bisoprolol (ZEBETA) 10 MG tablet TAKE 1 TABLET BY MOUTH EVERY DAY 30 tablet 0   cholecalciferol (VITAMIN D3) 25 MCG (1000 UNIT) tablet TAKE 1 TABLET BY MOUTH EVERY DAY 90 tablet 0   cloNIDine (CATAPRES) 0.2 MG tablet TAKE 1 TABLET (0.2 MG TOTAL) BY MOUTH 3 (THREE) TIMES DAILY. 270 tablet 1   famotidine (PEPCID) 20 MG tablet TAKE 1 TABLET BY MOUTH EVERYDAY AT BEDTIME 90 tablet 3   gabapentin (NEURONTIN) 100 MG capsule TAKE 1 CAPSULE (100 MG TOTAL) BY MOUTH AT BEDTIME. 90 capsule 3   insulin degludec (TRESIBA FLEXTOUCH) 200 UNIT/ML FlexTouch Pen Inject 140  Units into the skin daily. 18 mL 2   Insulin Pen Needle (BD PEN NEEDLE NANO U/F) 32G X 4 MM MISC USE 4X A DAY 200 each 6   Lancets (ONETOUCH DELICA PLUS [id number]) MISC USE TWICE A DAY 100 each 2   levothyroxine (SYNTHROID) 50 MCG tablet Take 1 tablet (50 mcg total) by mouth daily. 90 tablet 3   metFORMIN (GLUCOPHAGE) 1000 MG tablet TAKE 1 TABLET (1,000 MG TOTAL) BY MOUTH 2 (TWO) TIMES DAILY WITH A MEAL. 180 tablet 3   Multiple Vitamin (MULTIVITAMIN) tablet Take 1 tablet by mouth daily.     ONETOUCH VERIO test strip CHECKS SUGARS 4 TIMES DAILY, ON MEAL TIME INSULIN 100 strip 6   OZEMPIC, 1 MG/DOSE, 4 MG/3ML SOPN INJECT 1 MG INTO THE SKIN ONCE A WEEK. 3 mL 10   pantoprazole (PROTONIX) 40 MG tablet Take 1 tablet (40 mg total) by mouth daily. Take 30-60 min before first meal of the day 30 tablet 2   potassium chloride (KLOR-CON) 20 MEQ packet Take 20 mEq by mouth 2 (two) times daily. (Patient taking differently: Take 20 mEq by mouth 2 (two) times daily.) 180 each 3   rosuvastatin (CRESTOR) 20 MG tablet TAKE 1 TABLET BY MOUTH EVERY DAY 30 tablet 0   valsartan-hydrochlorothiazide (DIOVAN-HCT) 320-25 MG tablet TAKE 1 TABLET BY MOUTH EVERY DAY 30 tablet 0   Blood Glucose Monitoring Suppl (ONE TOUCH ULTRA 2) w/Device KIT 1 Device by Does not apply route 2 (two) times daily. 1 each 0   No current facility-administered medications on file prior to visit.     Objective: BP 122/78   Pulse 84   Wt 297 lb 6.4 oz (134.9 kg)   LMP 11/09/2013   BMI 42.67 kg/m   Gen: wd, wn, nad Mild tenderness in left lateral neck but otherwise neck nontender, normal range of motion, no lymphadenopathy, she has known goiter with no obvious nodules Lungs clear Heart regular rate rhythm, normal S1-S2, no murmurs Pulses within normal limits upper and lower extremities Tender over left shoulder in general, she is somewhat guarded of the left shoulder and upper arm, tender proximal half of the left upper arm, she seemed to  have very minimal movement on her own with the shoulder, and with passive motion I can only move the shoulder in flexion to about 10 degrees and barely can move 10 degrees of abduction.  Otherwise arm nontender and no swelling or bruising She has some puffy swelling of her left second and third MCPs, tender throughout the MCPs of both hands, slight swelling of right second and third MCPs.  Otherwise hands and wrist nontender without other deformity Seemingly normal strength and sensation of arms Psych: Pleasant, answers  questions appropriate, good eye contact Neuro: CN II through XII intact, unable to do heel-to-toe, negative Romberg, otherwise nonfocal exam      Assessment: Encounter Diagnoses  Name Primary?   Vertigo Yes   Fall, initial encounter    Needs flu shot    Essential hypertension, benign    Hyperlipidemia, unspecified hyperlipidemia type    History of sarcoidosis    Joint swelling    OSA on CPAP    Type 2 diabetes mellitus with hyperglycemia, with long-term current use of insulin (HCC)    Acquired hypothyroidism    Chronic gout without tophus, unspecified cause, unspecified site    Diffuse goiter    Elevated alkaline phosphatase level    Acute pain of left shoulder    Left arm pain    Polyarthralgia    Vitamin D deficiency    Alkaline phosphatase elevation    Orthostatic dizziness       Plan: Pain in left shoulder, left upper arm and decreased range of motion Go for x-rays You can use Tylenol for now as needed over-the-counter. Consider using arm sling over-the-counter for the left arm for several hours at a time to rest the arm since you are so tender today We will likely need to refer you to orthopedics given the findings  Please go to [facility] for your left shoulder and upper arm xrays.   Their hours are 8am - 4:30 pm Monday - Friday.  Take your insurance card with you.  [facility] [phone]  [place], [place], [place] [address]  [address], [place] [address]   Fall it sounds like your recent fall was partly due to vertigo but I am concerned about your blood pressure dropping too low.  I will forward a copy of my notes today to your cardiologist in case he wants to adjust some of your medications or see you back   Vertigo Hydrate well with water daily I am referring you to vestibular rehab to do special therapy to help vertigo and dizziness symptoms Begin meclizine twice daily to help with vertigo symptoms as well.  This is an oral tablet.  It can make you potentially sleepy so be a little cautious with this   Given some of the joint pain and swelling in your fingers and hands and pain in the fingers I am checking some additional labs for screening.  You may end up needing to see orthopedics or rheumatologist   High blood pressure You noted today that you have been out of your clonidine He also noted today that your blood pressures have run as low as 98/75.  We really do not want your blood pressure going this low particular in light of the dizziness I am going to send a copy of this to your cardiologist in case they want to lower the dose of one of your medications or make some adjustments   Diabetes Continue your medications and follow-up with Dr. Loanne Drilling as planned   High cholesterol-continue current medications, updated labs today   Hypothyroidism-continue current medication and we will update labs today   Sleep apnea-continue CPAP therapy   Elevated alkaline phosphatase and low vitamin D I am rechecking these labs today The assumption is that your alkaline phosphatase is related to the low vitamin D Continue vitamin D supplement   History of gout and elevated uric acid-I am rechecking your uric acid blood test today  Counseled on the influenza virus vaccine.  Vaccine information sheet given.  Influenza vaccine given after consent obtained.    Amy Rosario  was seen today for med check.  Diagnoses and all orders for this visit:  Vertigo -     PT vestibular rehab; Future  Fall, initial encounter -     DG Shoulder Left; Future -     DG Humerus Left; Future  Needs flu shot -     Flu Vaccine QUAD 41moIM (Fluarix, Fluzone & Alfiuria Quad PF)  Essential hypertension, benign -     Comprehensive metabolic panel  Hyperlipidemia, unspecified hyperlipidemia type -     Comprehensive metabolic panel -     Lipid panel  History of sarcoidosis  Joint swelling -     ANA -     CYCLIC CITRUL PEPTIDE ANTIBODY, IGG/IGA -     Rheumatoid factor -     Sedimentation rate -     Uric acid  OSA on CPAP  Type 2 diabetes mellitus with hyperglycemia, with long-term current use of insulin (HCC)  Acquired hypothyroidism -     TSH + free T4  Chronic gout without tophus, unspecified cause, unspecified site -     Uric acid  Diffuse goiter -     TSH + free T4  Elevated alkaline phosphatase level  Acute pain of left shoulder -     DG Shoulder Left; Future -     DG Humerus Left; Future  Left arm pain -     DG Shoulder Left; Future -     DG Humerus Left; Future  Polyarthralgia -     PT vestibular rehab; Future -     CBC -     ANA -     CYCLIC CITRUL PEPTIDE ANTIBODY, IGG/IGA -     Rheumatoid factor -     Sedimentation rate -     Uric acid  Vitamin D deficiency -     CBC  Alkaline phosphatase elevation -     VITAMIN D 25 Hydroxy (Vit-D Deficiency, Fractures) -     CBC  Orthostatic dizziness  Other orders -     meclizine (ANTIVERT) 25 MG tablet; Take 1 tablet (25 mg total) by mouth 2 (two) times daily.   F/u pending labs, xray, referrals

## 2020-10-18 LAB — CBC
Hematocrit: 43.1 % (ref 34.0–46.6)
Hemoglobin: 14.4 g/dL (ref 11.1–15.9)
MCH: 29.5 pg (ref 26.6–33.0)
MCHC: 33.4 g/dL (ref 31.5–35.7)
MCV: 88 fL (ref 79–97)
Platelets: 225 10*3/uL (ref 150–450)
RBC: 4.88 x10E6/uL (ref 3.77–5.28)
RDW: 12.3 % (ref 11.7–15.4)
WBC: 8 10*3/uL (ref 3.4–10.8)

## 2020-10-18 LAB — LIPID PANEL
Chol/HDL Ratio: 3.1 ratio (ref 0.0–4.4)
Cholesterol, Total: 145 mg/dL (ref 100–199)
HDL: 47 mg/dL (ref >39)
LDL Chol Calc (NIH): 80 mg/dL (ref 0–99)
Triglycerides: 99 mg/dL (ref 0–149)
VLDL Cholesterol Cal: 18 mg/dL (ref 5–40)

## 2020-10-18 LAB — COMPREHENSIVE METABOLIC PANEL
ALT: 14 IU/L (ref 0–32)
AST: 15 IU/L (ref 0–40)
Albumin/Globulin Ratio: 1.4 (ref 1.2–2.2)
Albumin: 4.5 g/dL (ref 3.8–4.9)
Alkaline Phosphatase: 81 IU/L (ref 44–121)
BUN/Creatinine Ratio: 11 (ref 9–23)
BUN: 15 mg/dL (ref 6–24)
Bilirubin Total: 0.4 mg/dL (ref 0.0–1.2)
CO2: 27 mmol/L (ref 20–29)
Calcium: 10.4 mg/dL — ABNORMAL HIGH (ref 8.7–10.2)
Chloride: 99 mmol/L (ref 96–106)
Creatinine, Ser: 1.31 mg/dL — ABNORMAL HIGH (ref 0.57–1.00)
Globulin, Total: 3.3 g/dL (ref 1.5–4.5)
Glucose: 102 mg/dL — ABNORMAL HIGH (ref 65–99)
Potassium: 3.5 mmol/L (ref 3.5–5.2)
Sodium: 143 mmol/L (ref 134–144)
Total Protein: 7.8 g/dL (ref 6.0–8.5)
eGFR: 48 mL/min/{1.73_m2} — ABNORMAL LOW (ref >59)

## 2020-10-18 LAB — URIC ACID: Uric Acid: 6.9 mg/dL (ref 3.0–7.2)

## 2020-10-18 LAB — CYCLIC CITRUL PEPTIDE ANTIBODY, IGG/IGA: Cyclic Citrullin Peptide Ab: >250 units — ABNORMAL HIGH (ref 0–19)

## 2020-10-18 LAB — VITAMIN D 25 HYDROXY (VIT D DEFICIENCY, FRACTURES): Vit D, 25-Hydroxy: 45.5 ng/mL (ref 30.0–100.0)

## 2020-10-18 LAB — SEDIMENTATION RATE: Sed Rate: 26 mm/hr (ref 0–40)

## 2020-10-18 LAB — TSH+FREE T4
Free T4: 1.32 ng/dL (ref 0.82–1.77)
TSH: 2.04 u[IU]/mL (ref 0.450–4.500)

## 2020-10-18 LAB — ANA: Anti Nuclear Antibody (ANA): NEGATIVE

## 2020-10-18 LAB — RHEUMATOID FACTOR: Rheumatoid fact SerPl-aCnc: 40.9 IU/mL — ABNORMAL HIGH (ref <14.0)

## 2020-10-19 ENCOUNTER — Other Ambulatory Visit: Payer: Self-pay | Admitting: Medical

## 2020-10-19 DIAGNOSIS — M25512 Pain in left shoulder: Secondary | ICD-10-CM

## 2020-10-19 DIAGNOSIS — M79602 Pain in left arm: Secondary | ICD-10-CM

## 2020-10-20 ENCOUNTER — Other Ambulatory Visit: Payer: Self-pay | Admitting: Medical

## 2020-10-20 DIAGNOSIS — M79602 Pain in left arm: Secondary | ICD-10-CM

## 2020-10-20 DIAGNOSIS — M2550 Pain in unspecified joint: Secondary | ICD-10-CM

## 2020-10-20 DIAGNOSIS — R748 Abnormal levels of other serum enzymes: Secondary | ICD-10-CM

## 2020-10-21 ENCOUNTER — Other Ambulatory Visit: Payer: Self-pay | Admitting: Medical

## 2020-10-21 ENCOUNTER — Other Ambulatory Visit: Payer: Self-pay | Admitting: Internal Medicine

## 2020-10-21 DIAGNOSIS — R42 Dizziness and giddiness: Secondary | ICD-10-CM

## 2020-11-14 ENCOUNTER — Ambulatory Visit: Payer: BC Managed Care – PPO | Admitting: Endocrinology

## 2020-11-14 ENCOUNTER — Other Ambulatory Visit: Payer: Self-pay

## 2020-11-14 VITALS — BP 160/86 | HR 88 | Ht 70.0 in | Wt 305.0 lb

## 2020-11-14 DIAGNOSIS — Z794 Long term (current) use of insulin: Secondary | ICD-10-CM

## 2020-11-14 DIAGNOSIS — E1165 Type 2 diabetes mellitus with hyperglycemia: Secondary | ICD-10-CM | POA: Diagnosis not present

## 2020-11-14 LAB — POCT GLYCOSYLATED HEMOGLOBIN (HGB A1C): Hemoglobin A1C: 6.7 % — AB (ref 4.0–5.6)

## 2020-11-14 MED ORDER — TRESIBA FLEXTOUCH 200 UNIT/ML ~~LOC~~ SOPN: 130.0000 [IU] | PEN_INJECTOR | Freq: Every day | SUBCUTANEOUS | 3 refills | Status: DC

## 2020-11-14 NOTE — Progress Notes (Signed)
Subjective:    Patient ID: Amy Rosario, female    DOB: January 21, 1961, 60 y.o.   MRN: [id number]  HPI Pt returns for f/u of diabetes mellitus: DM type: Insulin-requiring type 2.   Dx'ed: 2015.  Complications: PN and stage 3 CRI.  Therapy: insulin since 2017, Ozempic, and metformin.  GDM: never DKA: never Severe hypoglycemia: never Pancreatitis: never.  Pancreatic imaging: normal on 2018 [place].  Other: she takes QD insulin, after poor results with multiple daily injections; she takes intermitt prednisone for sarcoidosis; She stopped farxiga, due to vaginal bleeding; Tyler Aas had a $0 copay.  Interval history: no recent steroids.  no cbg record, but states cbg's vary from 78-126.  She takes meds as rx'ed.  She takes 140 units qd.  Pt also has MNG (dx'ed 2015; bx in 2020 showed Beth Cat 3, and affirma reported insuff material; f/u US in 2020 was unchanged--no f/u needed).   Past Medical History:  Diagnosis Date   Chronic gout    followed by pcp---  multiple sites (06-11-2019 per pt last episode fall 2020)   H/O echocardiogram 04-04-2018  received via fax on 06-11-2019 to be scanned in   Dr. Charolette Forward, [facility]-   Hyperlipidemia    Hypertension 2000   managed by Dr. Terrence Dupont, cardiology   Hypokalemia    Hypothyroidism    endocrinologist-- dr Loanne Drilling-- s/p bx's 03-18-2018 and 07-02-2013 results in epic   OSA on CPAP    pt states has older machine is very large   Peripheral neuropathy    PMB (postmenopausal bleeding)    Sarcoidosis of lung [facility])    pulmonology-- dr wert---  s/p brochoscopy 07-05-2016, no sarcoid  (dr wert released pt as needed per lov note in epic 06-02-2018)   Thyroid goiter    Type 2 diabetes mellitus treated with insulin [facility])    endocrinology--- dr Loanne Drilling   (06-11-2019  per pt checks blood surgar daily in am,  fasting surgar-- 85 -- 130)   Wears glasses     Past Surgical History:  Procedure Laterality Date   COLONOSCOPY WITH PROPOFOL  N/A 09/21/2013   Procedure: COLONOSCOPY WITH PROPOFOL;  Surgeon: Juanita Craver, MD;  Location: [facility];  Service: Endoscopy;  Laterality: N/A;   CYST EXCISION  teen   right lower back, benign   DILATATION & CURETTAGE/HYSTEROSCOPY WITH MYOSURE N/A 06/16/2019   Procedure: DILATATION & CURETTAGE/HYSTEROSCOPY WITH MYOSURE/POLYPECTOMY;  Surgeon: Salvadore Dom, MD;  Location: [facility];  Service: Gynecology;  Laterality: N/A;  polypectomy   ENDOMETRIAL BIOPSY  2021   Dr. Sumner Boast   TUBAL LIGATION Bilateral yrs ago   VIDEO BRONCHOSCOPY Bilateral 07/05/2016   Procedure: [place];  Surgeon: Tanda Rockers, MD;  Location: [facility];  Service: Endoscopy;  Laterality: Bilateral;    Social History   Socioeconomic History   Marital status: Single    Spouse name: Not on file   Number of children: Not on file   Years of education: Not on file   Highest education level: Not on file  Occupational History   Not on file  Tobacco Use   Smoking status: Never   Smokeless tobacco: Never  Vaping Use   Vaping Use: Never used  Substance and Sexual Activity   Alcohol use: No   Drug use: Never   Sexual activity: Not on file  Other Topics Concern   Not on file  Social History Narrative   Lives with sister [name],  single, has 3 sons.   Doing some walking for exercise.  Prior Consulting civil engineer at [employer], visit different churches.  Awaiting disability determination.   06/2019.     Social Determinants of Health   Financial Resource Strain: Not on file  Food Insecurity: Not on file  Transportation Needs: Not on file  Physical Activity: Not on file  Stress: Not on file  Social Connections: Not on file  Intimate Partner Violence: Not on file    Current Outpatient Medications on File Prior to Visit  Medication Sig Dispense Refill   allopurinol (ZYLOPRIM) 300 MG tablet TAKE 1 TABLET BY MOUTH EVERY DAY 90 tablet 0   amLODipine (NORVASC)  10 MG tablet TAKE 1 TABLET BY MOUTH EVERY DAY 30 tablet 0   aspirin 81 MG EC tablet TAKE 1 TABLET BY MOUTH EVERY DAY 30 tablet 0   bisoprolol (ZEBETA) 10 MG tablet TAKE 1 TABLET BY MOUTH EVERY DAY 30 tablet 0   Blood Glucose Monitoring Suppl (ONE TOUCH ULTRA 2) w/Device KIT 1 Device by Does not apply route 2 (two) times daily. 1 each 0   cholecalciferol (VITAMIN D3) 25 MCG (1000 UNIT) tablet TAKE 1 TABLET BY MOUTH EVERY DAY 90 tablet 0   cloNIDine (CATAPRES) 0.2 MG tablet TAKE 1 TABLET (0.2 MG TOTAL) BY MOUTH 3 (THREE) TIMES DAILY. 270 tablet 1   famotidine (PEPCID) 20 MG tablet TAKE 1 TABLET BY MOUTH EVERYDAY AT BEDTIME 90 tablet 3   gabapentin (NEURONTIN) 100 MG capsule TAKE 1 CAPSULE (100 MG TOTAL) BY MOUTH AT BEDTIME. 90 capsule 3   Insulin Pen Needle (BD PEN NEEDLE NANO U/F) 32G X 4 MM MISC USE 4X A DAY 200 each 6   Lancets (ONETOUCH DELICA PLUS [id number]) MISC USE TWICE A DAY 100 each 2   levothyroxine (SYNTHROID) 50 MCG tablet Take 1 tablet (50 mcg total) by mouth daily. 90 tablet 3   meclizine (ANTIVERT) 25 MG tablet Take 1 tablet (25 mg total) by mouth 2 (two) times daily. 30 tablet 0   metFORMIN (GLUCOPHAGE) 1000 MG tablet TAKE 1 TABLET (1,000 MG TOTAL) BY MOUTH 2 (TWO) TIMES DAILY WITH A MEAL. 180 tablet 3   Multiple Vitamin (MULTIVITAMIN) tablet Take 1 tablet by mouth daily.     ONETOUCH VERIO test strip CHECKS SUGARS 4 TIMES DAILY, ON MEAL TIME INSULIN 100 strip 6   OZEMPIC, 1 MG/DOSE, 4 MG/3ML SOPN INJECT 1 MG INTO THE SKIN ONCE A WEEK. 3 mL 10   pantoprazole (PROTONIX) 40 MG tablet Take 1 tablet (40 mg total) by mouth daily. Take 30-60 min before first meal of the day 30 tablet 2   potassium chloride (KLOR-CON) 20 MEQ packet Take 20 mEq by mouth 2 (two) times daily. (Patient taking differently: Take 20 mEq by mouth 2 (two) times daily.) 180 each 3   rosuvastatin (CRESTOR) 20 MG tablet TAKE 1 TABLET BY MOUTH EVERY DAY 30 tablet 0   valsartan-hydrochlorothiazide (DIOVAN-HCT) 320-25 MG  tablet TAKE 1 TABLET BY MOUTH EVERY DAY 30 tablet 5   No current facility-administered medications on file prior to visit.     Family History  Problem Relation Age of Onset   Heart disease Father    Hypertension Father    Heart disease Brother    Cancer Neg Hx    Stroke Neg Hx    Diabetes Neg Hx     BP (!) 160/86 (BP Location: Right Arm, Patient Position: Sitting, Cuff Size: Large)   Pulse 88  Ht 5' 10" (1.778 m)   Wt (!) 305 lb (138.3 kg)   LMP 11/09/2013   SpO2 93%   BMI 43.76 kg/m    Review of Systems     Objective:   Physical Exam Pulses: dorsalis pedis intact bilat.   MSK: no deformity of the feet CV: trace bilat leg edema Skin:  no ulcer on the feet.  normal color and temp on the feet. Neuro: sensation is intact to touch on the feet  Lab Results  Component Value Date   CREATININE 1.31 (H) 10/17/2020   BUN 15 10/17/2020   NA 143 10/17/2020   K 3.5 10/17/2020   CL 99 10/17/2020   CO2 27 10/17/2020    A1c=6.7%    Assessment & Plan:  Insulin-requiring type 2 DM: overcontrolled  Patient Instructions  Please reduce the Tresiba to 130 units per day, and continue the same other diabetes medications.  You can take the insulin at any time of day.  Therefore, if you miss it in the morning, you can take it later in the day. check your blood sugar twice a day.  vary the time of day when you check, between before the 3 meals, and at bedtime.  also check if you have symptoms of your blood sugar being too high or too low.  please keep a record of the readings and bring it to your next appointment here (or you can bring the meter itself).  You can write it on any piece of paper.  please call us sooner if your blood sugar goes below 70, or if you have a lot of readings over 200.   Please come back for a follow-up appointment in 4 months.

## 2020-11-14 NOTE — Patient Instructions (Addendum)
Please reduce the Tresiba to 130 units per day, and continue the same other diabetes medications.  You can take the insulin at any time of day.  Therefore, if you miss it in the morning, you can take it later in the day. check your blood sugar twice a day.  vary the time of day when you check, between before the 3 meals, and at bedtime.  also check if you have symptoms of your blood sugar being too high or too low.  please keep a record of the readings and bring it to your next appointment here (or you can bring the meter itself).  You can write it on any piece of paper.  please call us sooner if your blood sugar goes below 70, or if you have a lot of readings over 200.   Please come back for a follow-up appointment in 4 months.

## 2020-12-10 ENCOUNTER — Emergency Department (HOSPITAL_COMMUNITY)
Admission: EM | Admit: 2020-12-10 | Discharge: 2020-12-10 | Disposition: A | Discharge status: Home / Self Care | Payer: BC Managed Care – PPO | Attending: Emergency Medicine | Admitting: Emergency Medicine

## 2020-12-10 ENCOUNTER — Encounter (HOSPITAL_COMMUNITY): Payer: Self-pay

## 2020-12-10 ENCOUNTER — Other Ambulatory Visit: Payer: Self-pay

## 2020-12-10 DIAGNOSIS — Z794 Long term (current) use of insulin: Secondary | ICD-10-CM | POA: Diagnosis not present

## 2020-12-10 DIAGNOSIS — I10 Essential (primary) hypertension: Secondary | ICD-10-CM | POA: Diagnosis not present

## 2020-12-10 DIAGNOSIS — M2550 Pain in unspecified joint: Secondary | ICD-10-CM

## 2020-12-10 DIAGNOSIS — M130 Polyarthritis, unspecified: Secondary | ICD-10-CM | POA: Diagnosis not present

## 2020-12-10 DIAGNOSIS — Z7984 Long term (current) use of oral hypoglycemic drugs: Secondary | ICD-10-CM | POA: Diagnosis not present

## 2020-12-10 DIAGNOSIS — E039 Hypothyroidism, unspecified: Secondary | ICD-10-CM | POA: Insufficient documentation

## 2020-12-10 DIAGNOSIS — M7989 Other specified soft tissue disorders: Secondary | ICD-10-CM | POA: Insufficient documentation

## 2020-12-10 DIAGNOSIS — E119 Type 2 diabetes mellitus without complications: Secondary | ICD-10-CM | POA: Insufficient documentation

## 2020-12-10 DIAGNOSIS — M791 Myalgia, unspecified site: Secondary | ICD-10-CM | POA: Diagnosis present

## 2020-12-10 LAB — CBC WITH DIFFERENTIAL/PLATELET
Abs Immature Granulocytes: 0.04 10*3/uL (ref 0.00–0.07)
Basophils Absolute: 0.1 10*3/uL (ref 0.0–0.1)
Basophils Relative: 1 %
Eosinophils Absolute: 0.3 10*3/uL (ref 0.0–0.5)
Eosinophils Relative: 3 %
HCT: 42 % (ref 36.0–46.0)
Hemoglobin: 13.7 g/dL (ref 12.0–15.0)
Immature Granulocytes: 0 %
Lymphocytes Relative: 36 %
Lymphs Abs: 3.4 10*3/uL (ref 0.7–4.0)
MCH: 28.9 pg (ref 26.0–34.0)
MCHC: 32.6 g/dL (ref 30.0–36.0)
MCV: 88.6 fL (ref 80.0–100.0)
Monocytes Absolute: 0.5 10*3/uL (ref 0.1–1.0)
Monocytes Relative: 5 %
Neutro Abs: 5.3 10*3/uL (ref 1.7–7.7)
Neutrophils Relative %: 55 %
Platelets: 233 10*3/uL (ref 150–400)
RBC: 4.74 MIL/uL (ref 3.87–5.11)
RDW: 13.3 % (ref 11.5–15.5)
WBC: 9.5 10*3/uL (ref 4.0–10.5)
nRBC: 0 % (ref 0.0–0.2)

## 2020-12-10 LAB — BASIC METABOLIC PANEL
Anion gap: 12 (ref 5–15)
BUN: 13 mg/dL (ref 6–20)
CO2: 27 mmol/L (ref 22–32)
Calcium: 9.2 mg/dL (ref 8.9–10.3)
Chloride: 100 mmol/L (ref 98–111)
Creatinine, Ser: 1.13 mg/dL — ABNORMAL HIGH (ref 0.44–1.00)
GFR, Estimated: 56 mL/min — ABNORMAL LOW (ref >60)
Glucose, Bld: 215 mg/dL — ABNORMAL HIGH (ref 70–99)
Potassium: 3.5 mmol/L (ref 3.5–5.1)
Sodium: 139 mmol/L (ref 135–145)

## 2020-12-10 MED ORDER — KETOROLAC TROMETHAMINE 30 MG/ML IJ SOLN
30.0000 mg | Freq: Once | INTRAMUSCULAR | Status: AC
  Administered 2020-12-10: 30 mg via INTRAVENOUS
  Filled 2020-12-10: qty 1

## 2020-12-10 MED ORDER — MORPHINE SULFATE (PF) 4 MG/ML IV SOLN
4.0000 mg | Freq: Once | INTRAVENOUS | Status: AC
  Administered 2020-12-10: 4 mg via INTRAVENOUS
  Filled 2020-12-10: qty 1

## 2020-12-10 MED ORDER — ONDANSETRON HCL 4 MG/2ML IJ SOLN
4.0000 mg | Freq: Once | INTRAMUSCULAR | Status: AC
  Administered 2020-12-10: 4 mg via INTRAVENOUS
  Filled 2020-12-10: qty 2

## 2020-12-10 MED ORDER — OXYCODONE HCL 5 MG PO TABS: 5.0000 mg | ORAL_TABLET | ORAL | 0 refills | Status: DC | PRN

## 2020-12-10 MED ORDER — METHYLPREDNISOLONE 4 MG PO TBPK: ORAL_TABLET | ORAL | 0 refills | Status: DC

## 2020-12-10 NOTE — ED Provider Notes (Signed)
[facility] EMERGENCY DEPARTMENT Provider Note   CSN: [id number] Arrival date & time: 12/10/20  1106    History Chief Complaint  Patient presents with   Generalized Body Aches    Amy Rosario is a 60 y.o. female with no significant past medical history who presents for evaluation of joint pain.  Has had pain in her joints for months.  Was seen by PCP.  Had elevated CCP, RA factor.  She was offered steroids however declined as this makes her blood sugars go up.  Patient states pain has become so bad over the last month she has difficulty doing ADLs due to pain in her joints.  She denies any recent falls or injuries.  No history of IV drug use.  No fever, chills, nausea, vomiting, chest pain, shortness of breath, abdominal pain, redness or warmth to joints.  Does have history of chronic gout and takes allopurinol.  She states she has appointment for rheumatology in December.  Pain has not changed however has has not improved either.  Denies additional or aggravating relieving factors.  Rates her pain a 10/10  History obtained from patient and past medical records.  No interpreter used   HPI     Past Medical History:  Diagnosis Date   Chronic gout    followed by pcp---  multiple sites (06-11-2019 per pt last episode fall 2020)   H/O echocardiogram 04-04-2018  received via fax on 06-11-2019 to be scanned in   Dr. Charolette Forward, [facility]-   Hyperlipidemia    Hypertension 2000   managed by Dr. Terrence Dupont, cardiology   Hypokalemia    Hypothyroidism    endocrinologist-- dr Loanne Drilling-- s/p bx's 03-18-2018 and 07-02-2013 results in epic   OSA on CPAP    pt states has older machine is very large   Peripheral neuropathy    PMB (postmenopausal bleeding)    Sarcoidosis of lung [facility])    pulmonology-- dr wert---  s/p brochoscopy 07-05-2016, no sarcoid  (dr wert released pt as needed per lov note in epic 06-02-2018)   Thyroid goiter    Type 2 diabetes  mellitus treated with insulin [facility])    endocrinology--- dr Loanne Drilling   (06-11-2019  per pt checks blood surgar daily in am,  fasting surgar-- 85 -- 130)   Wears glasses     Patient Active Problem List   Diagnosis Date Noted   Vertigo 10/17/2020   Fall 10/17/2020   Needs flu shot 10/17/2020   Acquired hypothyroidism 10/17/2020   Orthostatic dizziness 10/17/2020   Alkaline phosphatase elevation 10/17/2020   Acute pain of left shoulder 10/17/2020   Left arm pain 10/17/2020   Polyarthralgia 10/17/2020   Elevated uric acid in blood 12/28/2019   Need for hepatitis vaccination 12/28/2019   History of sarcoidosis 06/08/2019   Joint swelling 06/08/2019   Hypokalemia 06/08/2019   Decreased pedal pulses 02/24/2018   Vitamin D deficiency 05/20/2017   Diffuse goiter 05/04/2017   Upper airway cough syndrome 05/03/2017   Nail hypertrophy 10/04/2016   Vaccine counseling 10/04/2016   Need for influenza vaccination 10/04/2016   Chronic gout without tophus 08/29/2016   Elevated alkaline phosphatase level 03/08/2016   Abnormal ultrasound of abdomen 03/08/2016   Fatty liver 03/08/2016   Encounter for screening mammogram for malignant neoplasm of breast 03/08/2016   Abnormal cervical Papanicolaou smear 03/08/2016   Type 2 diabetes mellitus with hyperglycemia, with long-term current use of insulin ([place]) 06/27/2015   OSA on CPAP 06/27/2015  Morbid obesity due to excess calories ([place]) 05/27/2013   Essential hypertension, benign 05/27/2013   Hyperlipidemia 05/27/2013    Past Surgical History:  Procedure Laterality Date   COLONOSCOPY WITH PROPOFOL N/A 09/21/2013   Procedure: COLONOSCOPY WITH PROPOFOL;  Surgeon: Juanita Craver, MD;  Location: [facility];  Service: Endoscopy;  Laterality: N/A;   CYST EXCISION  teen   right lower back, benign   DILATATION & CURETTAGE/HYSTEROSCOPY WITH MYOSURE N/A 06/16/2019   Procedure: DILATATION & CURETTAGE/HYSTEROSCOPY WITH MYOSURE/POLYPECTOMY;  Surgeon: Salvadore Dom, MD;  Location: [place];  Service: Gynecology;  Laterality: N/A;  polypectomy   ENDOMETRIAL BIOPSY  2021   Dr. Sumner Boast   TUBAL LIGATION Bilateral yrs ago   VIDEO BRONCHOSCOPY Bilateral 07/05/2016   Procedure: Brushy;  Surgeon: Tanda Rockers, MD;  Location: [facility];  Service: Endoscopy;  Laterality: Bilateral;     OB History     Gravida  3   Para  3   Term  3   Preterm      AB      Living  3      SAB      IAB      Ectopic      Multiple      Live Births              Family History  Problem Relation Age of Onset   Heart disease Father    Hypertension Father    Heart disease Brother    Cancer Neg Hx    Stroke Neg Hx    Diabetes Neg Hx     Social History   Tobacco Use   Smoking status: Never   Smokeless tobacco: Never  Vaping Use   Vaping Use: Never used  Substance Use Topics   Alcohol use: No   Drug use: Never    Home Medications Prior to Admission medications   Medication Sig Start Date End Date Taking? Authorizing Provider  methylPREDNISolone (MEDROL DOSEPAK) 4 MG TBPK tablet Take as prescribed on box 12/10/20  Yes Fulton Merry A, PA-C  oxyCODONE (ROXICODONE) 5 MG immediate release tablet Take 1 tablet (5 mg total) by mouth every 4 (four) hours as needed for severe pain. 12/10/20  Yes Nealy Hickmon A, PA-C  allopurinol (ZYLOPRIM) 300 MG tablet TAKE 1 TABLET BY MOUTH EVERY DAY 07/22/20   Tysinger, Camelia Eng, PA-C  amLODipine (NORVASC) 10 MG tablet TAKE 1 TABLET BY MOUTH EVERY DAY 05/24/20   Tysinger, Camelia Eng, PA-C  aspirin 81 MG EC tablet TAKE 1 TABLET BY MOUTH EVERY DAY 05/24/20   Tysinger, Camelia Eng, PA-C  bisoprolol (ZEBETA) 10 MG tablet TAKE 1 TABLET BY MOUTH EVERY DAY 05/24/20   Tysinger, Camelia Eng, PA-C  Blood Glucose Monitoring Suppl (ONE TOUCH ULTRA 2) w/Device KIT 1 Device by Does not apply route 2 (two) times daily. 10/04/16   Tysinger, Camelia Eng, PA-C  cholecalciferol (VITAMIN D3)  25 MCG (1000 UNIT) tablet TAKE 1 TABLET BY MOUTH EVERY DAY 03/25/20   Tysinger, Camelia Eng, PA-C  cloNIDine (CATAPRES) 0.2 MG tablet TAKE 1 TABLET (0.2 MG TOTAL) BY MOUTH 3 (THREE) TIMES DAILY. 08/11/19   Tysinger, Camelia Eng, PA-C  famotidine (PEPCID) 20 MG tablet TAKE 1 TABLET BY MOUTH EVERYDAY AT BEDTIME 07/25/18   Tanda Rockers, MD  gabapentin (NEURONTIN) 100 MG capsule TAKE 1 CAPSULE (100 MG TOTAL) BY MOUTH AT BEDTIME. 04/19/20   Tysinger, Camelia Eng, PA-C  insulin degludec (TRESIBA  FLEXTOUCH) 200 UNIT/ML FlexTouch Pen Inject 130 Units into the skin daily. 11/14/20   Renato Shin, MD  Insulin Pen Needle (BD PEN NEEDLE NANO U/F) 32G X 4 MM MISC USE 4X A DAY 06/16/18   Renato Shin, MD  Lancets [facility] DELICA PLUS [id number]) MISC USE TWICE A DAY 12/07/19   Tysinger, Camelia Eng, PA-C  levothyroxine (SYNTHROID) 50 MCG tablet Take 1 tablet (50 mcg total) by mouth daily. 11/09/19   Renato Shin, MD  meclizine (ANTIVERT) 25 MG tablet Take 1 tablet (25 mg total) by mouth 2 (two) times daily. 10/17/20   Tysinger, Camelia Eng, PA-C  metFORMIN (GLUCOPHAGE) 1000 MG tablet TAKE 1 TABLET (1,000 MG TOTAL) BY MOUTH 2 (TWO) TIMES DAILY WITH A MEAL. 02/11/19   Renato Shin, MD  Multiple Vitamin (MULTIVITAMIN) tablet Take 1 tablet by mouth daily.    [provider]  ONETOUCH VERIO test strip CHECKS SUGARS 4 TIMES DAILY, ON MEAL TIME INSULIN 09/22/18   Tysinger, Camelia Eng, PA-C  OZEMPIC, 1 MG/DOSE, 4 MG/3ML SOPN INJECT 1 MG INTO THE SKIN ONCE A WEEK. 09/20/20   Renato Shin, MD  pantoprazole (PROTONIX) 40 MG tablet Take 1 tablet (40 mg total) by mouth daily. Take 30-60 min before first meal of the day 05/03/17   Tanda Rockers, MD  potassium chloride (KLOR-CON) 20 MEQ packet Take 20 mEq by mouth 2 (two) times daily. Patient taking differently: Take 20 mEq by mouth 2 (two) times daily. 06/08/19   Tysinger, Camelia Eng, PA-C  rosuvastatin (CRESTOR) 20 MG tablet TAKE 1 TABLET BY MOUTH EVERY DAY 05/24/20   Tysinger, Camelia Eng, PA-C   valsartan-hydrochlorothiazide (DIOVAN-HCT) 320-25 MG tablet TAKE 1 TABLET BY MOUTH EVERY DAY 10/21/20   Tysinger, Camelia Eng, PA-C    Allergies    Penicillins  Review of Systems   Review of Systems  Constitutional: Negative.   HENT: Negative.    Respiratory: Negative.    Cardiovascular: Negative.   Gastrointestinal: Negative.   Genitourinary: Negative.   Musculoskeletal:  Positive for arthralgias.  Skin: Negative.   Neurological: Negative.   All other systems reviewed and are negative.  Physical Exam Updated Vital Signs BP (!) 143/83   Pulse 76   Temp 98.3 F (36.8 C) (Oral)   Resp 16   Ht _0  (1.702 m)   Wt 134.7 kg   LMP 11/09/2013   SpO2 94%   BMI 46.52 kg/m   Physical Exam Vitals and nursing note reviewed.  Constitutional:      General: She is not in acute distress.    Appearance: She is well-developed. She is not ill-appearing, toxic-appearing or diaphoretic.  HENT:     Head: Normocephalic and atraumatic.     Nose: Nose normal.     Mouth/Throat:     Mouth: Mucous membranes are moist.  Eyes:     Pupils: Pupils are equal, round, and reactive to light.  Cardiovascular:     Rate and Rhythm: Normal rate.     Pulses: Normal pulses.          Radial pulses are 2+ on the right side and 2+ on the left side.       Dorsalis pedis pulses are 2+ on the right side and 2+ on the left side.     Heart sounds: Normal heart sounds.  Pulmonary:     Effort: No respiratory distress.     Breath sounds: Normal breath sounds.  Abdominal:     General: Bowel sounds are normal. There  is no distension.     Palpations: Abdomen is soft.  Musculoskeletal:        General: Normal range of motion.     Cervical back: Normal range of motion.     Comments: Diffuse tenderness over majority of joints.  She has some mild soft tissue swelling over her bilateral MCPs however no warmth or erythema.  Limited range of motion to bilateral shoulders overhead secondary to pain.  Decreased range of  motion to bilateral knees and ankles secondary to pain.  Skin:    General: Skin is warm and dry.     Capillary Refill: Capillary refill takes less than 2 seconds.     Comments: No erythema, warmth, fluctuance or induration  Neurological:     General: No focal deficit present.     Mental Status: She is alert and oriented to person, place, and time.     Comments: Cranial nerves 2-12 grossly intact Equal strength however decreased with handgrip secondary to pain  Psychiatric:        Mood and Affect: Mood normal.    ED Results / Procedures / Treatments   Labs (all labs ordered are listed, but only abnormal results are displayed) Labs Reviewed  BASIC METABOLIC PANEL - Abnormal; Notable for the following components:      Result Value   Glucose, Bld 215 (*)    Creatinine, Ser 1.13 (*)    GFR, Estimated 56 (*)    All other components within normal limits  CBC WITH DIFFERENTIAL/PLATELET    EKG None  Radiology No results found.  Procedures Procedures   Medications Ordered in ED Medications  morphine 4 MG/ML injection 4 mg (4 mg Intravenous Given 12/10/20 1219)  ondansetron (ZOFRAN) injection 4 mg (4 mg Intravenous Given 12/10/20 1218)  ketorolac (TORADOL) 30 MG/ML injection 30 mg (30 mg Intravenous Given 12/10/20 1356)   ED Course  I have reviewed the triage vital signs and the nursing notes.  Pertinent labs & imaging results that were available during my care of the patient were reviewed by me and considered in my medical decision making (see chart for details).  Here for evaluation of polyarthralgias which have been going on for months.  She is afebrile, nonseptic, not ill-appearing.  Does have diffuse tenderness to almost all joints palpated however no overlying erythema, warmth.  Does have some mild swelling to bilateral metacarpals.  Pain typically worse in the morning, typically improves in the afternoon however does continue to have constant pain.  Seen by PCP for this has  rheumatology follow-up.  She does not have any infectious symptoms on exam.  She appears overall well.  No red flags such as fever, IV drug use.  Did review her prior labs with positive CCP, RA factor.  Labs and imaging personally reviewed and interpreted: CBC without leukocytosis Metabolic panel glucose 865, creatinine 1.13, similar to prior  Patient reassessed.  I discussed likely sort of arthralgia whether possible RA, fibromyalgia.  I low suspicion for acute septic polyarthralgia.  She has had no recent falls or injuries to suggest acute traumatic injury such as fracture, dislocation.  Her compartments are soft, low suspicion for VTE, compartment syndrome, ischemia.  She has equal pulses bilaterally.  She is amenable to a short course of steroids.  I discussed if her blood sugars are elevated she will need to stop this.  Also give short course of pain medicine.  Discussed follow-up with PCP and see if she can get into a rheumatology appointment  sooner than December.  She will return for new or worsening symptoms.  The patient has been appropriately medically screened and/or stabilized in the ED. I have low suspicion for any other emergent medical condition which would require further screening, evaluation or treatment in the ED or require inpatient management.  Patient is hemodynamically stable and in no acute distress.  Patient able to ambulate in department prior to ED.  Evaluation does not show acute pathology that would require ongoing or additional emergent interventions while in the emergency department or further inpatient treatment.  I have discussed the diagnosis with the patient and answered all questions.  Pain is been managed while in the emergency department and patient has no further complaints prior to discharge.  Patient is comfortable with plan discussed in room and is stable for discharge at this time.  I have discussed strict return precautions for returning to the emergency  department.  Patient was encouraged to follow-up with PCP/specialist refer to at discharge.     MDM Rules/Calculators/A&P                            Final Clinical Impression(s) / ED Diagnoses Final diagnoses:  Polyarthralgia    Rx / DC Orders ED Discharge Orders          Ordered    oxyCODONE (ROXICODONE) 5 MG immediate release tablet  Every 4 hours PRN        12/10/20 1412    methylPREDNISolone (MEDROL DOSEPAK) 4 MG TBPK tablet        12/10/20 1412             Amiyah Shryock A, PA-C 12/10/20 1427    Davonna Belling, MD 12/10/20 1556

## 2020-12-10 NOTE — Discharge Instructions (Addendum)
Call your primary care provider and let them know you were seen here in the emergency department  As discussed I started you on a short course of some steroids.  Please take as prescribed on the box.  Make sure to check your blood sugars at least 5 times daily.  Blood sugars are greater than 250 need to stop the steroid  Have also written you a short course of some pain medicine.  Take as prescribed.  Do not drive or operate heavy machinery while taking this medication.  This medication may become addictive.  Return for new or worsening symptoms

## 2020-12-10 NOTE — ED Triage Notes (Signed)
Patient to ED complaining of all over joint pain that started last night. States has hx of gout that usually is in hands/feet. This pain started there but has moved to neck/back/ all over. Denies any injury or sick contacts

## 2020-12-13 ENCOUNTER — Ambulatory Visit (INDEPENDENT_AMBULATORY_CARE_PROVIDER_SITE_OTHER): Payer: BC Managed Care – PPO | Admitting: Medical

## 2020-12-13 ENCOUNTER — Other Ambulatory Visit: Payer: Self-pay

## 2020-12-13 VITALS — BP 130/82 | HR 80 | Temp 98.1°F | Wt 304.6 lb

## 2020-12-13 DIAGNOSIS — E790 Hyperuricemia without signs of inflammatory arthritis and tophaceous disease: Secondary | ICD-10-CM

## 2020-12-13 DIAGNOSIS — E039 Hypothyroidism, unspecified: Secondary | ICD-10-CM

## 2020-12-13 DIAGNOSIS — M2550 Pain in unspecified joint: Secondary | ICD-10-CM | POA: Diagnosis not present

## 2020-12-13 DIAGNOSIS — M0580 Other rheumatoid arthritis with rheumatoid factor of unspecified site: Secondary | ICD-10-CM

## 2020-12-13 DIAGNOSIS — R768 Other specified abnormal immunological findings in serum: Secondary | ICD-10-CM

## 2020-12-13 DIAGNOSIS — E1165 Type 2 diabetes mellitus with hyperglycemia: Secondary | ICD-10-CM

## 2020-12-13 DIAGNOSIS — Z862 Personal history of diseases of the blood and blood-forming organs and certain disorders involving the immune mechanism: Secondary | ICD-10-CM | POA: Diagnosis not present

## 2020-12-13 DIAGNOSIS — Z794 Long term (current) use of insulin: Secondary | ICD-10-CM

## 2020-12-13 DIAGNOSIS — M2540 Effusion, unspecified joint: Secondary | ICD-10-CM

## 2020-12-13 DIAGNOSIS — I10 Essential (primary) hypertension: Secondary | ICD-10-CM

## 2020-12-13 DIAGNOSIS — M1A9XX0 Chronic gout, unspecified, without tophus (tophi): Secondary | ICD-10-CM

## 2020-12-13 NOTE — Progress Notes (Signed)
Subjective:  Amy Rosario is a 60 y.o. female who presents for Chief Complaint  Patient presents with   Generalized Body Aches    Body aches x 2 weeks. Follow-up from ED. Given steriods     medical team:  Dr Terrence Dupont (not in epic),  Dr. Loanne Drilling,  Dr. Talbert Nan,  Dr. Collene Mares, prior  Dr. Melvyn Novas for prior sarcoid   Here for emergency department follow-up.  She notes going to the hospital emergency department recently for multiple joint pains.  It started with pains in her hands and fingers that progressed to neck then legs and back over the course of the day or so.  At 1 point she could not even hardly walk because of the pain.  She does not specifically note joint stiffness there.  She has had finger swelling wrist swelling wrist and finger pain.  She still has quite a bit of pain in her left index finger and wrist.  She was prescribed steroid and pain medicine.  She is using both.  She feels about 50% improved.  She does have a history of sarcoidosis  Otherwise she reports compliance with medication  No other aggravating or relieving factors.    No other c/o.  Past Medical History:  Diagnosis Date   Chronic gout    followed by pcp---  multiple sites (06-11-2019 per pt last episode fall 2020)   H/O echocardiogram 04-04-2018  received via fax on 06-11-2019 to be scanned in   Dr. Charolette Forward, [facility]-   Hyperlipidemia    Hypertension 2000   managed by Dr. Terrence Dupont, cardiology   Hypokalemia    Hypothyroidism    endocrinologist-- dr Loanne Drilling-- s/p bx's 03-18-2018 and 07-02-2013 results in epic   OSA on CPAP    pt states has older machine is very large   Peripheral neuropathy    PMB (postmenopausal bleeding)    Sarcoidosis of lung [facility])    pulmonology-- dr wert---  s/p brochoscopy 07-05-2016, no sarcoid  (dr wert released pt as needed per lov note in epic 06-02-2018)   Thyroid goiter    Type 2 diabetes mellitus treated with insulin [facility])    endocrinology--- dr  Loanne Drilling   (06-11-2019  per pt checks blood surgar daily in am,  fasting surgar-- 85 -- 130)   Wears glasses    Current Outpatient Medications on File Prior to Visit  Medication Sig Dispense Refill   allopurinol (ZYLOPRIM) 300 MG tablet TAKE 1 TABLET BY MOUTH EVERY DAY 90 tablet 0   amLODipine (NORVASC) 10 MG tablet TAKE 1 TABLET BY MOUTH EVERY DAY 30 tablet 0   aspirin 81 MG EC tablet TAKE 1 TABLET BY MOUTH EVERY DAY 30 tablet 0   bisoprolol (ZEBETA) 10 MG tablet TAKE 1 TABLET BY MOUTH EVERY DAY 30 tablet 0   cholecalciferol (VITAMIN D3) 25 MCG (1000 UNIT) tablet TAKE 1 TABLET BY MOUTH EVERY DAY 90 tablet 0   gabapentin (NEURONTIN) 100 MG capsule TAKE 1 CAPSULE (100 MG TOTAL) BY MOUTH AT BEDTIME. 90 capsule 3   insulin degludec (TRESIBA FLEXTOUCH) 200 UNIT/ML FlexTouch Pen Inject 130 Units into the skin daily. 78 mL 3   Insulin Pen Needle (BD PEN NEEDLE NANO U/F) 32G X 4 MM MISC USE 4X A DAY 200 each 6   Lancets (ONETOUCH DELICA PLUS [id number]) MISC USE TWICE A DAY 100 each 2   levothyroxine (SYNTHROID) 50 MCG tablet Take 1 tablet (50 mcg total) by mouth daily. 90 tablet 3  meclizine (ANTIVERT) 25 MG tablet Take 1 tablet (25 mg total) by mouth 2 (two) times daily. 30 tablet 0   metFORMIN (GLUCOPHAGE) 1000 MG tablet TAKE 1 TABLET (1,000 MG TOTAL) BY MOUTH 2 (TWO) TIMES DAILY WITH A MEAL. 180 tablet 3   methylPREDNISolone (MEDROL DOSEPAK) 4 MG TBPK tablet Take as prescribed on box 1 each 0   Multiple Vitamin (MULTIVITAMIN) tablet Take 1 tablet by mouth daily.     oxyCODONE (ROXICODONE) 5 MG immediate release tablet Take 1 tablet (5 mg total) by mouth every 4 (four) hours as needed for severe pain. 15 tablet 0   OZEMPIC, 1 MG/DOSE, 4 MG/3ML SOPN INJECT 1 MG INTO THE SKIN ONCE A WEEK. 3 mL 10   pantoprazole (PROTONIX) 40 MG tablet Take 1 tablet (40 mg total) by mouth daily. Take 30-60 min before first meal of the day 30 tablet 2   potassium chloride (KLOR-CON) 20 MEQ packet Take 20 mEq by mouth 2  (two) times daily. (Patient taking differently: Take 20 mEq by mouth 2 (two) times daily.) 180 each 3   rosuvastatin (CRESTOR) 20 MG tablet TAKE 1 TABLET BY MOUTH EVERY DAY 30 tablet 0   valsartan-hydrochlorothiazide (DIOVAN-HCT) 320-25 MG tablet TAKE 1 TABLET BY MOUTH EVERY DAY 30 tablet 5   Blood Glucose Monitoring Suppl (ONE TOUCH ULTRA 2) w/Device KIT 1 Device by Does not apply route 2 (two) times daily. 1 each 0   ONETOUCH VERIO test strip CHECKS SUGARS 4 TIMES DAILY, ON MEAL TIME INSULIN 100 strip 6   No current facility-administered medications on file prior to visit.     The following portions of the patient's history were reviewed and updated as appropriate: allergies, current medications, past family history, past medical history, past social history, past surgical history and problem list.  ROS Otherwise as in subjective above    Objective: BP 130/82   Pulse 80   Temp 98.1 F (36.7 C)   Wt (!) 304 lb 9.6 oz (138.2 kg)   LMP 11/09/2013   BMI 47.71 kg/m   General appearance: alert, no distress, well developed, well nourished Decreased range of motion bilateral wrist and fingers in general.  Worst of the left index finger decreased range of motion swelling of left finger in general.  There is localized swelling specifically left second through third proximal phalanges, tender over right upper arm and right biceps tendon origin.  Decreased range of motion with left shoulder as well.  Mild tenderness over left hip and decreased range major motion of left external hip range of motion.  Otherwise legs nontender Pulses: 2+ radial pulses, 2+ pedal pulses, normal cap refill   Assessment: Encounter Diagnoses  Name Primary?   Polyarthralgia Yes   Joint swelling    Type 2 diabetes mellitus with hyperglycemia, with long-term current use of insulin (HCC)    History of sarcoidosis    Essential hypertension, benign    Elevated uric acid in blood    Acquired hypothyroidism     Chronic gout without tophus, unspecified cause, unspecified site    Polyarthritis with positive rheumatoid factor (HCC)    Cyclic citrullinated peptide (CCP) antibody positive      Plan: Reviewed her recent emergency department note and labs.  I had seen her back in September and at that time she had positive CCP antibody, positive rheumatoid factor.  We had referred to rheumatology but her visit is not until December.  Unfortunately she has a current flareup.  Advise she finish  out the steroid Dosepak given by the emergency department.  Use the pain medicine as needed.  Continue to hydrate well continue other medicines as usual.  We discussed the risk and benefits of prednisone long-term risk.  Advised if she gets another flareup in between now and December to call back and we may have to consider of prolonged prednisone addition to get standard rheumatology.  She will also call rheumatology to see if she can get in sooner than the appointment they gave her  Sherlyn was seen today for generalized body aches.  Diagnoses and all orders for this visit:  Polyarthralgia  Joint swelling  Type 2 diabetes mellitus with hyperglycemia, with long-term current use of insulin (HCC)  History of sarcoidosis  Essential hypertension, benign  Elevated uric acid in blood  Acquired hypothyroidism  Chronic gout without tophus, unspecified cause, unspecified site  Polyarthritis with positive rheumatoid factor (HCC)  Cyclic citrullinated peptide (CCP) antibody positive   Follow up: with rheumatology

## 2021-01-02 ENCOUNTER — Telehealth: Payer: Self-pay

## 2021-01-02 MED ORDER — ROSUVASTATIN CALCIUM 20 MG PO TABS: 20.0000 mg | ORAL_TABLET | Freq: Every day | ORAL | 0 refills | Status: DC

## 2021-01-02 MED ORDER — VITAMIN D 25 MCG (1000 UNIT) PO TABS: 1000.0000 [IU] | ORAL_TABLET | Freq: Every day | ORAL | 0 refills | Status: DC

## 2021-01-02 MED ORDER — BISOPROLOL FUMARATE 10 MG PO TABS: 10.0000 mg | ORAL_TABLET | Freq: Every day | ORAL | 0 refills | Status: DC

## 2021-01-02 MED ORDER — AMLODIPINE BESYLATE 10 MG PO TABS: 10.0000 mg | ORAL_TABLET | Freq: Every day | ORAL | 0 refills | Status: DC

## 2021-01-02 NOTE — Telephone Encounter (Signed)
Pt called & needs refills Vit D3, Rosuvastatin, Amlodipine & Bisoprolol to [facility] would like 90 days because it's cheaper

## 2021-01-02 NOTE — Telephone Encounter (Signed)
done

## 2021-03-03 ENCOUNTER — Other Ambulatory Visit: Payer: Self-pay | Admitting: Medical

## 2021-03-12 ENCOUNTER — Other Ambulatory Visit: Payer: Self-pay | Admitting: Medical

## 2021-03-12 ENCOUNTER — Other Ambulatory Visit: Payer: Self-pay | Admitting: Endocrinology

## 2021-03-12 DIAGNOSIS — M79672 Pain in left foot: Secondary | ICD-10-CM

## 2021-03-12 DIAGNOSIS — M79671 Pain in right foot: Secondary | ICD-10-CM

## 2021-03-12 DIAGNOSIS — M792 Neuralgia and neuritis, unspecified: Secondary | ICD-10-CM

## 2021-03-12 DIAGNOSIS — E119 Type 2 diabetes mellitus without complications: Secondary | ICD-10-CM

## 2021-03-13 NOTE — Telephone Encounter (Signed)
I've sent a mychart message telling pt she is due. Ok to refill med for 30 days

## 2021-03-20 ENCOUNTER — Ambulatory Visit (INDEPENDENT_AMBULATORY_CARE_PROVIDER_SITE_OTHER): Payer: BC Managed Care – PPO | Admitting: Endocrinology

## 2021-03-20 ENCOUNTER — Other Ambulatory Visit: Payer: Self-pay

## 2021-03-20 VITALS — BP 128/80 | HR 87 | Ht 67.0 in | Wt 325.4 lb

## 2021-03-20 DIAGNOSIS — Z794 Long term (current) use of insulin: Secondary | ICD-10-CM

## 2021-03-20 DIAGNOSIS — E1165 Type 2 diabetes mellitus with hyperglycemia: Secondary | ICD-10-CM | POA: Diagnosis not present

## 2021-03-20 LAB — POCT GLYCOSYLATED HEMOGLOBIN (HGB A1C): Hemoglobin A1C: 7.4 % — AB (ref 4.0–5.6)

## 2021-03-20 MED ORDER — OZEMPIC (2 MG/DOSE) 8 MG/3ML ~~LOC~~ SOPN: 2.0000 mg | PEN_INJECTOR | SUBCUTANEOUS | 3 refills | Status: DC

## 2021-03-20 MED ORDER — TRESIBA FLEXTOUCH 200 UNIT/ML ~~LOC~~ SOPN: 120.0000 [IU] | PEN_INJECTOR | Freq: Every day | SUBCUTANEOUS | 3 refills | Status: DC

## 2021-03-20 NOTE — Progress Notes (Signed)
Subjective:    Patient ID: Amy Rosario, female    DOB: 10/11/1960, 61 y.o.   MRN: [id number]  HPI Pt returns for f/u of diabetes mellitus: DM type: Insulin-requiring type 2.   Dx'ed: 2015.  Complications: PN and stage 3 CRI.  Therapy: insulin since 2017, Ozempic, and metformin.   GDM: never DKA: never Severe hypoglycemia: never Pancreatitis: never.  Pancreatic imaging: normal on 2018 [place].  Other: she takes QD insulin, after poor results with multiple daily injections; she takes intermitt prednisone for sarcoidosis; She stopped farxiga, due to vaginal bleeding; Tyler Aas had a $0 copay.  Interval history: no recent steroids.  no cbg record, but states cbg's vary from 76-226.  She takes meds as rx'ed.   Pt also has MNG/hypothyroidism (dx'ed 2015; bx in 2020 showed Beth Cat 3, and affirma reported insuff material; f/u US in 2020 was unchanged--no f/u needed; she takes synthroid).   Past Medical History:  Diagnosis Date   Chronic gout    followed by pcp---  multiple sites (06-11-2019 per pt last episode fall 2020)   H/O echocardiogram 04-04-2018  received via fax on 06-11-2019 to be scanned in   Dr. Charolette Forward, [facility]-   Hyperlipidemia    Hypertension 2000   managed by Dr. Terrence Dupont, cardiology   Hypokalemia    Hypothyroidism    endocrinologist-- dr Loanne Drilling-- s/p bx's 03-18-2018 and 07-02-2013 results in epic   OSA on CPAP    pt states has older machine is very large   Peripheral neuropathy    PMB (postmenopausal bleeding)    Sarcoidosis of lung [facility])    pulmonology-- dr wert---  s/p brochoscopy 07-05-2016, no sarcoid  (dr wert released pt as needed per lov note in epic 06-02-2018)   Thyroid goiter    Type 2 diabetes mellitus treated with insulin [facility])    endocrinology--- dr Loanne Drilling   (06-11-2019  per pt checks blood surgar daily in am,  fasting surgar-- 85 -- 130)   Wears glasses     Past Surgical History:  Procedure Laterality Date   COLONOSCOPY  WITH PROPOFOL N/A 09/21/2013   Procedure: COLONOSCOPY WITH PROPOFOL;  Surgeon: Juanita Craver, MD;  Location: [facility];  Service: Endoscopy;  Laterality: N/A;   CYST EXCISION  teen   right lower back, benign   DILATATION & CURETTAGE/HYSTEROSCOPY WITH MYOSURE N/A 06/16/2019   Procedure: DILATATION & CURETTAGE/HYSTEROSCOPY WITH MYOSURE/POLYPECTOMY;  Surgeon: Salvadore Dom, MD;  Location: [place];  Service: Gynecology;  Laterality: N/A;  polypectomy   ENDOMETRIAL BIOPSY  2021   Dr. Sumner Boast   TUBAL LIGATION Bilateral yrs ago   VIDEO BRONCHOSCOPY Bilateral 07/05/2016   Procedure: [place];  Surgeon: Tanda Rockers, MD;  Location: [facility];  Service: Endoscopy;  Laterality: Bilateral;    Social History   Socioeconomic History   Marital status: Single    Spouse name: Not on file   Number of children: Not on file   Years of education: Not on file   Highest education level: Not on file  Occupational History   Not on file  Tobacco Use   Smoking status: Never   Smokeless tobacco: Never  Vaping Use   Vaping Use: Never used  Substance and Sexual Activity   Alcohol use: No   Drug use: Never   Sexual activity: Not on file  Other Topics Concern   Not on file  Social History Narrative   Lives with sister Amy Rosario, single,  has 3 sons.   Doing some walking for exercise.  Prior Consulting civil engineer at [employer], visit different churches.  Awaiting disability determination.   06/2019.     Social Determinants of Health   Financial Resource Strain: Not on file  Food Insecurity: Not on file  Transportation Needs: Not on file  Physical Activity: Not on file  Stress: Not on file  Social Connections: Not on file  Intimate Partner Violence: Not on file    Current Outpatient Medications on File Prior to Visit  Medication Sig Dispense Refill   allopurinol (ZYLOPRIM) 300 MG tablet TAKE 1 TABLET BY MOUTH EVERY DAY 90 tablet 0    amLODipine (NORVASC) 10 MG tablet TAKE 1 TABLET BY MOUTH EVERY DAY 90 tablet 0   bisoprolol (ZEBETA) 10 MG tablet TAKE 1 TABLET BY MOUTH EVERY DAY 90 tablet 0   Blood Glucose Monitoring Suppl (ONE TOUCH ULTRA 2) w/Device KIT 1 Device by Does not apply route 2 (two) times daily. 1 each 0   [facility] ASPIRIN LOW STRENGTH 81 MG EC tablet TAKE 1 TABLET BY MOUTH EVERY DAY 30 tablet 0   D-1000 EXTRA STRENGTH 25 MCG (1000 UT) tablet TAKE 1 TABLET BY MOUTH EVERY DAY 90 tablet 1   gabapentin (NEURONTIN) 100 MG capsule TAKE 1 CAPSULE BY MOUTH AT BEDTIME. 90 capsule 3   Lancets (ONETOUCH DELICA PLUS [id number]) MISC USE TWICE A DAY 100 each 2   levothyroxine (SYNTHROID) 50 MCG tablet TAKE 1 TABLET BY MOUTH EVERY DAY 90 tablet 3   meclizine (ANTIVERT) 25 MG tablet TAKE 1 TABLET BY MOUTH TWICE A DAY 30 tablet 0   metFORMIN (GLUCOPHAGE) 1000 MG tablet TAKE 1 TABLET (1,000 MG TOTAL) BY MOUTH 2 (TWO) TIMES DAILY WITH A MEAL. 180 tablet 3   Multiple Vitamin (MULTIVITAMIN) tablet Take 1 tablet by mouth daily.     ONETOUCH VERIO test strip CHECKS SUGARS 4 TIMES DAILY, ON MEAL TIME INSULIN 100 strip 6   oxyCODONE (ROXICODONE) 5 MG immediate release tablet Take 1 tablet (5 mg total) by mouth every 4 (four) hours as needed for severe pain. 15 tablet 0   pantoprazole (PROTONIX) 40 MG tablet Take 1 tablet (40 mg total) by mouth daily. Take 30-60 min before first meal of the day 30 tablet 2   potassium chloride (KLOR-CON) 20 MEQ packet Take 20 mEq by mouth 2 (two) times daily. (Patient taking differently: Take 20 mEq by mouth 2 (two) times daily.) 180 each 3   rosuvastatin (CRESTOR) 20 MG tablet TAKE 1 TABLET BY MOUTH EVERY DAY 90 tablet 0   valsartan-hydrochlorothiazide (DIOVAN-HCT) 320-25 MG tablet TAKE 1 TABLET BY MOUTH EVERY DAY 30 tablet 5   No current facility-administered medications on file prior to visit.      Family History  Problem Relation Age of Onset   Heart disease Father    Hypertension Father    Heart  disease Brother    Cancer Neg Hx    Stroke Neg Hx    Diabetes Neg Hx     BP 128/80    Pulse 87    Ht 5' 7"  (1.702 m)    Wt (!) 325 lb 6.4 oz (147.6 kg)    LMP 11/09/2013    SpO2 94%    BMI 50.96 kg/m    Review of Systems She denies hypoglycemia/N/V/HB/bloating.     Objective:   Physical Exam    Lab Results  Component Value Date   HGBA1C 7.4 (A) 03/20/2021  Assessment & Plan:  Insulin-requiring type 2 DM: uncontrolled  Patient Instructions  Please reduce the Tresiba to 130 units per day, and continue the same other diabetes medications.  You can take the insulin at any time of day.  Therefore, if you miss it in the morning, you can take it later in the day. check your blood sugar twice a day.  vary the time of day when you check, between before the 3 meals, and at bedtime.  also check if you have symptoms of your blood sugar being too high or too low.  please keep a record of the readings and bring it to your next appointment here (or you can bring the meter itself).  You can write it on any piece of paper.  please call us sooner if your blood sugar goes below 70, or if you have a lot of readings over 200.   Please come back for a follow-up appointment in 4 months.

## 2021-03-20 NOTE — Patient Instructions (Addendum)
I have sent a prescription to your pharmacy, to double the [place].  Please reduce the Tresiba to 130 units per day, and continue the same other diabetes medications.  You can take the insulin at any time of day.  Therefore, if you miss it in the morning, you can take it later in the day.   check your blood sugar twice a day.  vary the time of day when you check, between before the 3 meals, and at bedtime.  also check if you have symptoms of your blood sugar being too high or too low.  please keep a record of the readings and bring it to your next appointment here (or you can bring the meter itself).  You can write it on any piece of paper.  please call us sooner if your blood sugar goes below 70, or if you have a lot of readings over 200.   Please come back for a follow-up appointment in May.

## 2021-04-06 ENCOUNTER — Other Ambulatory Visit: Payer: Self-pay | Admitting: Medical

## 2021-05-04 NOTE — Progress Notes (Signed)
61 y.o. [id number] Single Black or African American Not Hispanic or Latino female here for annual exam.  Not currently sexually active, ended her relationship last year. No vaginal bleeding. ? ?No bowel or bladder c/o.  ?  ?H/O HTN, elevated lipids, goiter, elevated BMI and DM. Last HgbA1C 7.4% in 2/23. ? ?Patient's last menstrual period was 11/09/2013.          ?Sexually active: No.  ?The current method of family planning is post menopausal status.    ?Exercising: Yes.    Walk, arm exercises.  ?Smoker:  no ? ?Health Maintenance: ?Pap: 04/29/19 WNL 03/21/2017 WNL NEG HR HPV, 03-20-16 WNL NEG HR HPV, 05-27-13 LGSIL NEG HR HPV   ?History of abnormal Pap:  yes, LSIL, no surgery on her cerix.  ?MMG:  07/20/19 density B bi-rands 1 neg  ?BMD:   none  ?Colonoscopy: 09/21/13, f/u 10 years  ?TDaP:  05/27/13  ?Gardasil: n/a ? ? reports that she has never smoked. She has never used smokeless tobacco. She reports that she does not drink alcohol and does not use drugs. She is on disability. ?3 grown kids (all boys), 14 grand kids  ? ?Past Medical History:  ?Diagnosis Date  ? Chronic gout   ? followed by pcp---  multiple sites (06-11-2019 per pt last episode fall 2020)  ? H/O echocardiogram 04-04-2018  received via fax on 06-11-2019 to be scanned in  ? Dr. Charolette [facility], [facility]-  ? Hyperlipidemia   ? Hypertension 2000  ? managed by Dr. Terrence Dupont, cardiology  ? Hypokalemia   ? Hypothyroidism   ? endocrinologist-- dr Loanne Drilling-- s/p bx's 03-18-2018 and 07-02-2013 results in epic  ? OSA on CPAP   ? pt states has older machine is very large  ? Peripheral neuropathy   ? PMB (postmenopausal bleeding)   ? Sarcoidosis of lung ([place])   ? pulmonology-- dr wert---  s/p brochoscopy 07-05-2016, no sarcoid  (dr wert released pt as needed per lov note in epic 06-02-2018)  ? Thyroid goiter   ? Type 2 diabetes mellitus treated with insulin ([place])   ? endocrinology--- dr Loanne Drilling   (06-11-2019  per pt checks blood surgar daily in am,   fasting surgar-- 85 -- 130)  ? Wears glasses   ? ? ?Past Surgical History:  ?Procedure Laterality Date  ? COLONOSCOPY WITH PROPOFOL N/A 09/21/2013  ? Procedure: COLONOSCOPY WITH PROPOFOL;  Surgeon: Juanita Craver, MD;  [facility];  Service: Endoscopy;  Laterality: N/A;  ? CYST EXCISION  teen  ? right lower back, benign  ? DILATATION & CURETTAGE/HYSTEROSCOPY WITH MYOSURE N/A 06/16/2019  ? Procedure: DILATATION & CURETTAGE/HYSTEROSCOPY WITH MYOSURE/POLYPECTOMY;  Surgeon: Salvadore Dom, MD;  Location: [facility];  Service: Gynecology;  Laterality: N/A;  polypectomy  ? ENDOMETRIAL BIOPSY  2021  ? Dr. Sumner Boast  ? TUBAL LIGATION Bilateral yrs ago  ? VIDEO BRONCHOSCOPY Bilateral 07/05/2016  ? Procedure: VIDEO BRONCHOSCOPY WITH FLUORO;  Surgeon: Tanda Rockers, MD;  Location: [facility];  Service: Endoscopy;  Laterality: Bilateral;  ? ? ?Current Outpatient Medications  ?Medication Sig Dispense Refill  ? allopurinol (ZYLOPRIM) 300 MG tablet TAKE 1 TABLET BY MOUTH EVERY DAY 90 tablet 0  ? amLODipine (NORVASC) 10 MG tablet TAKE 1 TABLET BY MOUTH EVERY DAY 90 tablet 0  ? aspirin 81 MG EC tablet TAKE 1 TABLET BY MOUTH EVERY DAY 30 tablet 2  ? bisoprolol (ZEBETA) 10 MG tablet TAKE 1 TABLET BY MOUTH EVERY DAY 90  tablet 0  ? Blood Glucose Monitoring Suppl (ONE TOUCH ULTRA 2) w/Device KIT 1 Device by Does not apply route 2 (two) times daily. 1 each 0  ? D-1000 EXTRA STRENGTH 25 MCG (1000 UT) tablet TAKE 1 TABLET BY MOUTH EVERY DAY 90 tablet 1  ? gabapentin (NEURONTIN) 100 MG capsule TAKE 1 CAPSULE BY MOUTH AT BEDTIME. 90 capsule 3  ? insulin degludec (TRESIBA FLEXTOUCH) 200 UNIT/ML FlexTouch Pen Inject 120 Units into the skin daily. And pen needles 1/day 60 mL 3  ? Lancets (ONETOUCH DELICA PLUS [id number]) MISC USE TWICE A DAY 100 each 2  ? levothyroxine (SYNTHROID) 50 MCG tablet TAKE 1 TABLET BY MOUTH EVERY DAY 90 tablet 3  ? meclizine (ANTIVERT) 25 MG tablet TAKE 1 TABLET BY MOUTH TWICE A DAY 30  tablet 0  ? metFORMIN (GLUCOPHAGE) 1000 MG tablet TAKE 1 TABLET (1,000 MG TOTAL) BY MOUTH 2 (TWO) TIMES DAILY WITH A MEAL. 180 tablet 3  ? Multiple Vitamin (MULTIVITAMIN) tablet Take 1 tablet by mouth daily.    ? ONETOUCH VERIO test strip CHECKS SUGARS 4 TIMES DAILY, ON MEAL TIME INSULIN 100 strip 6  ? potassium chloride (KLOR-CON) 20 MEQ packet Take 20 mEq by mouth 2 (two) times daily. (Patient taking differently: Take 20 mEq by mouth 2 (two) times daily.) 180 each 3  ? rosuvastatin (CRESTOR) 20 MG tablet TAKE 1 TABLET BY MOUTH EVERY DAY 90 tablet 0  ? Semaglutide, 2 MG/DOSE, (OZEMPIC, 2 MG/DOSE,) 8 MG/3ML SOPN Inject 2 mg into the skin once a week. 9 mL 3  ? valsartan-hydrochlorothiazide (DIOVAN-HCT) 320-25 MG tablet TAKE 1 TABLET BY MOUTH EVERY DAY 30 tablet 5  ? ?No current facility-administered medications for this visit.  ? ? ?Family History  ?Problem Relation Age of Onset  ? Heart disease Father   ? Hypertension Father   ? Heart disease Brother   ? Cancer Neg Hx   ? Stroke Neg Hx   ? Diabetes Neg Hx   ? ? ?Review of Systems  ?All other systems reviewed and are negative. ? ?Exam:   ?BP 110/74   Pulse 93   Ht _0  (1.702 m)   Wt (!) 326 lb (147.9 kg)   LMP 11/09/2013   SpO2 100%   BMI 51.06 kg/m?   Weight change: _1 @ Height:   Height: _2  (170.2 cm)  ?Ht Readings from Last 3 Encounters:  ?05/08/21 _3  (1.702 m)  ?03/20/21 _4  (1.702 m)  ?12/10/20 _5  (1.702 m)  ? ? ?General appearance: alert, cooperative and appears stated age ?Head: Normocephalic, without obvious abnormality, atraumatic ?Neck: no adenopathy, supple, symmetrical, trachea midline and thyroid normal to inspection and palpation ?Lungs: clear to auscultation bilaterally ?Cardiovascular: regular rate and rhythm ?Breasts: normal appearance, no masses or tenderness ?Abdomen: soft, non-tender; non distended,  no masses,  no organomegaly ?Extremities: extremities normal, atraumatic, no cyanosis or edema ?Skin: Skin color,  texture, turgor normal. No rashes or lesions ?Lymph nodes: Cervical, supraclavicular, and axillary nodes normal. ?No abnormal inguinal nodes palpated ?Neurologic: Grossly normal ? ? ?Pelvic: External genitalia:  no lesions ?             Urethra:  normal appearing urethra with no masses, tenderness or lesions ?             Bartholins and Skenes: normal    ?             Vagina: normal appearing vagina with normal color and discharge, no  lesions ?             Cervix: no lesions ?              ?Bimanual Exam:  Uterus:   no masses or tenderness ?             Adnexa: no mass, fullness, tenderness ?              Rectovaginal: Confirms ?              Anus:  normal sphincter tone, no lesions ? ?Gae Dry chaperoned for the exam. ? ?1. Well woman exam ?Discussed breast self exam ?Discussed calcium and vit D intake ?Pap next year ?Mammogram overdue, she will schedule ?Colonoscopy UTD ? ?2. BMI 50.0-59.9, adult ([place]) ?Working on exercise, declines referral to weight loss clinic ? ?

## 2021-05-08 ENCOUNTER — Encounter: Payer: Self-pay | Admitting: Obstetrics and Gynecology

## 2021-05-08 ENCOUNTER — Ambulatory Visit (INDEPENDENT_AMBULATORY_CARE_PROVIDER_SITE_OTHER): Payer: 59 | Admitting: Obstetrics and Gynecology

## 2021-05-08 VITALS — BP 110/74 | HR 93 | Ht 67.0 in | Wt 326.0 lb

## 2021-05-08 DIAGNOSIS — E1165 Type 2 diabetes mellitus with hyperglycemia: Secondary | ICD-10-CM | POA: Insufficient documentation

## 2021-05-08 DIAGNOSIS — Z6841 Body Mass Index (BMI) 40.0 and over, adult: Secondary | ICD-10-CM | POA: Diagnosis not present

## 2021-05-08 DIAGNOSIS — R5383 Other fatigue: Secondary | ICD-10-CM | POA: Insufficient documentation

## 2021-05-08 DIAGNOSIS — M0579 Rheumatoid arthritis with rheumatoid factor of multiple sites without organ or systems involvement: Secondary | ICD-10-CM | POA: Insufficient documentation

## 2021-05-08 DIAGNOSIS — Z01419 Encounter for gynecological examination (general) (routine) without abnormal findings: Secondary | ICD-10-CM | POA: Diagnosis not present

## 2021-05-08 NOTE — Patient Instructions (Signed)

## 2021-06-10 ENCOUNTER — Other Ambulatory Visit: Payer: Self-pay | Admitting: Endocrinology

## 2021-06-10 ENCOUNTER — Other Ambulatory Visit: Payer: Self-pay | Admitting: Medical

## 2021-06-12 ENCOUNTER — Other Ambulatory Visit: Payer: Self-pay

## 2021-06-12 ENCOUNTER — Telehealth: Payer: Self-pay

## 2021-06-12 DIAGNOSIS — E1165 Type 2 diabetes mellitus with hyperglycemia: Secondary | ICD-10-CM

## 2021-06-12 MED ORDER — BISOPROLOL FUMARATE 10 MG PO TABS: 10.0000 mg | ORAL_TABLET | Freq: Every day | ORAL | 0 refills | Status: DC

## 2021-06-12 MED ORDER — PEN NEEDLES 33G X 4 MM MISC: 4 refills | Status: AC

## 2021-06-12 MED ORDER — OZEMPIC (2 MG/DOSE) 8 MG/3ML ~~LOC~~ SOPN: 2.0000 mg | PEN_INJECTOR | SUBCUTANEOUS | 3 refills | Status: DC

## 2021-06-12 MED ORDER — AMLODIPINE BESYLATE 10 MG PO TABS: 10.0000 mg | ORAL_TABLET | Freq: Every day | ORAL | 0 refills | Status: DC

## 2021-06-12 MED ORDER — ONETOUCH VERIO VI STRP: ORAL_STRIP | 6 refills | Status: DC

## 2021-06-12 NOTE — Telephone Encounter (Signed)
Refilled for 30 days only ?

## 2021-06-12 NOTE — Telephone Encounter (Signed)
Pt left message needs refill Bisoprolol and amlodipine.  I called pt to schedule an appt, she states having emergency at her house and will have to call back and schedule an appt.

## 2021-06-19 ENCOUNTER — Encounter: Payer: Self-pay | Admitting: Endocrinology

## 2021-06-19 ENCOUNTER — Ambulatory Visit: Payer: BC Managed Care – PPO | Admitting: Endocrinology

## 2021-06-19 ENCOUNTER — Ambulatory Visit (INDEPENDENT_AMBULATORY_CARE_PROVIDER_SITE_OTHER): Payer: BC Managed Care – PPO | Admitting: Endocrinology

## 2021-06-19 VITALS — BP 125/75 | HR 105 | Ht 67.0 in | Wt 324.0 lb

## 2021-06-19 DIAGNOSIS — I10 Essential (primary) hypertension: Secondary | ICD-10-CM | POA: Diagnosis not present

## 2021-06-19 DIAGNOSIS — E063 Autoimmune thyroiditis: Secondary | ICD-10-CM | POA: Diagnosis not present

## 2021-06-19 DIAGNOSIS — E1165 Type 2 diabetes mellitus with hyperglycemia: Secondary | ICD-10-CM | POA: Diagnosis not present

## 2021-06-19 DIAGNOSIS — Z794 Long term (current) use of insulin: Secondary | ICD-10-CM

## 2021-06-19 LAB — POCT GLYCOSYLATED HEMOGLOBIN (HGB A1C): Hemoglobin A1C: 7.4 % — AB (ref 4.0–5.6)

## 2021-06-19 MED ORDER — DAPAGLIFLOZIN PROPANEDIOL 5 MG PO TABS: 5.0000 mg | ORAL_TABLET | Freq: Every day | ORAL | 3 refills | Status: DC

## 2021-06-19 NOTE — Progress Notes (Addendum)
Patient ID: Amy Rosario, female   DOB: Oct 24, 1960, 61 y.o.   MRN: [id number]           Reason for Appointment: Type II Diabetes follow-up   History of Present Illness   Diagnosis date: 2015  Previous history:  She thinks she was started on insulin a couple of months after her diagnosis but details are not available She was continued on metformin also Over the years she has had significant difficulty with losing weight A1c as high as 13 back in 2018 Recent A1c range 6.4-8.9  Non-insulin hypoglycemic drugs: Ozempic 2 mg weekly     Insulin regimen: Tresiba 120 units every morning        Current A1c 7.4, unchanged  Current self management, blood sugar patterns and problems identified:  She is checking her blood sugars generally twice a day with One Touch meter which she did not bring Although she thinks her blood sugars are fairly good most of her sugars are before meals Occasionally she will take sugars after dinner but this may be only 5 minutes after eating her dinner Previously had been on 130 units of insulin but this was reduced Likely when her Ozempic was increased to 2 mg Even with 2 mg Ozempic she has not lost weight despite eating small portions Only mild intensity of exercise Usually not having symptoms of hypoglycemia Lowest blood sugar by recall 78 in the morning Reluctant to use CGM      Side effects from medications: None  Exercise: walks 3/7 days, 10-15 minutes slowly  Diet management: Usually eating small portions      Dinner 5-6 pm   Monitors blood glucose: 1-2 times a day   glucometer: One Touch.           Blood Glucose readings from recall:  PRE-MEAL Fasting Lunch Dinner Bedtime Overall  Glucose range: 78-115  120-130    Mean/median:        POST-MEAL PC Breakfast PC Lunch PC Dinner  Glucose range:   160?  Mean/median:                             Dietician visit: Most recent:    Unknown  Weight control:  Wt Readings from Last 3  Encounters:  06/19/21 (!) 324 lb (147 kg)  05/08/21 (!) 326 lb (147.9 kg)  03/20/21 (!) 325 lb 6.4 oz (147.6 kg)          Complications:     Diabetes labs:  Lab Results  Component Value Date   HGBA1C 7.4 (A) 06/19/2021   HGBA1C 7.4 (A) 03/20/2021   HGBA1C 6.7 (A) 11/14/2020   Lab Results  Component Value Date   MICROALBUR 3.0 08/29/2016   LDLCALC 80 10/17/2020   CREATININE 1.13 (H) 12/10/2020     Allergies as of 06/19/2021       Reactions   Penicillins Hives, Swelling   Has patient had a PCN reaction causing immediate rash, facial/tongue/throat swelling, SOB or lightheadedness with hypotension: Yes Has patient had a PCN reaction causing severe rash involving mucus membranes or skin necrosis: No Has patient had a PCN reaction that required hospitalization: No Has patient had a PCN reaction occurring within the last 10 years: No If all of the above answers are "NO", then may proceed with Cephalosporin use.        Medication List        Accurate as of Jun 19, 2021  8:28 PM. If you have any questions, ask your nurse or doctor.          allopurinol 300 MG tablet Commonly known as: ZYLOPRIM TAKE 1 TABLET BY MOUTH EVERY DAY   amLODipine 10 MG tablet Commonly known as: NORVASC Take 1 tablet (10 mg total) by mouth daily.   aspirin 81 MG EC tablet TAKE 1 TABLET BY MOUTH EVERY DAY   bisoprolol 10 MG tablet Commonly known as: ZEBETA Take 1 tablet (10 mg total) by mouth daily.   D-1000 Extra Strength 25 MCG (1000 UT) tablet Generic drug: Cholecalciferol TAKE 1 TABLET BY MOUTH EVERY DAY   dapagliflozin propanediol 5 MG Tabs tablet Commonly known as: Farxiga Take 1 tablet (5 mg total) by mouth daily. Started by: Amy Snare, MD   gabapentin 100 MG capsule Commonly known as: NEURONTIN TAKE 1 CAPSULE BY MOUTH AT BEDTIME.   levothyroxine 50 MCG tablet Commonly known as: SYNTHROID TAKE 1 TABLET BY MOUTH EVERY DAY   meclizine 25 MG tablet Commonly known as:  ANTIVERT TAKE 1 TABLET BY MOUTH TWICE A DAY   metFORMIN 1000 MG tablet Commonly known as: GLUCOPHAGE TAKE 1 TABLET (1,000 MG TOTAL) BY MOUTH 2 (TWO) TIMES DAILY WITH A MEAL.   multivitamin tablet Take 1 tablet by mouth daily.   ONE TOUCH ULTRA 2 w/Device Kit 1 Device by Does not apply route 2 (two) times daily.   OneTouch Delica Plus [id number] Misc USE TWICE A DAY   OneTouch Verio test strip Generic drug: glucose blood CHECKS SUGARS 4 TIMES DAILY, ON MEAL TIME INSULIN   Ozempic (2 MG/DOSE) 8 MG/3ML Sopn Generic drug: Semaglutide (2 MG/DOSE) Inject 2 mg into the skin once a week.   Pen Needles 33G X 4 MM Misc Use as instructed to inject insulin 1/day   potassium chloride 20 MEQ packet Commonly known as: Klor-Con Take 20 mEq by mouth 2 (two) times daily.   rosuvastatin 20 MG tablet Commonly known as: CRESTOR TAKE 1 TABLET BY MOUTH EVERY DAY   Tresiba FlexTouch 200 UNIT/ML FlexTouch Pen Generic drug: insulin degludec Inject 120 Units into the skin daily. And pen needles 1/day   valsartan-hydrochlorothiazide 320-25 MG tablet Commonly known as: DIOVAN-HCT TAKE 1 TABLET BY MOUTH EVERY DAY        Allergies:  Allergies  Allergen Reactions   Penicillins Hives and Swelling    Has patient had a PCN reaction causing immediate rash, facial/tongue/throat swelling, SOB or lightheadedness with hypotension: Yes Has patient had a PCN reaction causing severe rash involving mucus membranes or skin necrosis: No Has patient had a PCN reaction that required hospitalization: No Has patient had a PCN reaction occurring within the last 10 years: No If all of the above answers are "NO", then may proceed with Cephalosporin use.    Past Medical History:  Diagnosis Date   Chronic gout    followed by pcp---  multiple sites (06-11-2019 per pt last episode fall 2020)   H/O echocardiogram 04-04-2018  received via fax on 06-11-2019 to be scanned in   Dr. Charolette Forward, [facility]-   Hyperlipidemia    Hypertension 2000   managed by Dr. Terrence Dupont, cardiology   Hypokalemia    Hypothyroidism    endocrinologist-- dr Loanne Drilling-- s/p bx's 03-18-2018 and 07-02-2013 results in epic   OSA on CPAP    pt states has older machine is very large   Peripheral neuropathy    PMB (postmenopausal bleeding)    Sarcoidosis of lung ([place])  pulmonology-- dr wert---  s/p brochoscopy 07-05-2016, no sarcoid  (dr wert released pt as needed per lov note in epic 06-02-2018)   Thyroid goiter    Type 2 diabetes mellitus treated with insulin [facility])    endocrinology--- dr Loanne Drilling   (06-11-2019  per pt checks blood surgar daily in am,  fasting surgar-- 85 -- 130)   Wears glasses     Past Surgical History:  Procedure Laterality Date   COLONOSCOPY WITH PROPOFOL N/A 09/21/2013   Procedure: COLONOSCOPY WITH PROPOFOL;  Surgeon: Juanita Craver, MD;  Location: [facility];  Service: Endoscopy;  Laterality: N/A;   CYST EXCISION  teen   right lower back, benign   DILATATION & CURETTAGE/HYSTEROSCOPY WITH MYOSURE N/A 06/16/2019   Procedure: DILATATION & CURETTAGE/HYSTEROSCOPY WITH MYOSURE/POLYPECTOMY;  Surgeon: Salvadore Dom, MD;  Location: [place];  Service: Gynecology;  Laterality: N/A;  polypectomy   ENDOMETRIAL BIOPSY  2021   Dr. Sumner Boast   TUBAL LIGATION Bilateral yrs ago   VIDEO BRONCHOSCOPY Bilateral 07/05/2016   Procedure: [place];  Surgeon: Tanda Rockers, MD;  [facility]: [facility];  Service: Endoscopy;  Laterality: Bilateral;    Family History  Problem Relation Age of Onset   Heart disease Father    Hypertension Father    Heart disease Brother    Cancer Neg Hx    Stroke Neg Hx    Diabetes Neg Hx     Social History:  reports that she has never smoked. She has never used smokeless tobacco. She reports that she does not drink alcohol and does not use drugs.  Review of Systems:  Hypertension: Taking Diovan HCT  along with bisoprolol and amlodipine 10 mg Occasionally will have some leg edema  BP Readings from Last 3 Encounters:  06/19/21 125/75  05/08/21 110/74  03/20/21 128/80   Lab Results  Component Value Date   CREATININE 1.13 (H) 12/10/2020   CREATININE 1.31 (H) 10/17/2020   CREATININE 1.20 (H) 06/16/2019    Lipids: Controlled with Crestor 20 mg    Lab Results  Component Value Date   CHOL 145 10/17/2020   CHOL 292 (H) 02/24/2018   CHOL 156 05/20/2017   Lab Results  Component Value Date   HDL 47 10/17/2020   HDL 47 02/24/2018   HDL 46 05/20/2017   Lab Results  Component Value Date   LDLCALC 80 10/17/2020   LDLCALC 184 (H) 02/24/2018   LDLCALC 79 05/20/2017   Lab Results  Component Value Date   TRIG 99 10/17/2020   TRIG 306 (H) 02/24/2018   TRIG 155 (H) 05/20/2017   Lab Results  Component Value Date   CHOLHDL 3.1 10/17/2020   CHOLHDL 6.2 (H) 02/24/2018   CHOLHDL 3.4 05/20/2017   No results found for: LDLDIRECT  Taking 50 mcg of levothyroxine, baseline TSH 5.2  Lab Results  Component Value Date   TSH 2.040 10/17/2020   TSH 4.61 (H) 11/09/2019   TSH 4.53 (H) 05/11/2019   FREET4 1.32 10/17/2020   FREET4 0.72 11/09/2019   FREET4 1.10 02/24/2018    Eye exam 1/20   Neuropathy: Has numbness, pins-and-needles and some pains on walking relieved by gabapentin somewhat which is taken at night   Examination:   BP 125/75 (Cuff Size: Large)   Pulse (!) 105   Ht 5' 7"  (1.702 m)   Wt (!) 324 lb (147 kg)   LMP 11/09/2013   BMI 50.75 kg/m   Body mass index is 50.75 kg/m.  ASSESSMENT/ PLAN:    Diabetes type 2 on insulin with severe obesity:   Current regimen Tresiba, Ozempic, metformin  Also has some neuropathic symptoms but microalbuminuria needs to be assessed  A1c 7.4 but better last year  Blood glucose control difficult to assess because of inadequate monitoring especially after meals Exercise can be increased Likely will benefit from further  diabetes education/diet management, further intensification of her regimen but likely reduction in basal insulin needed with low normal fasting readings at times  Will restart Farxiga 5 mg daily She had taken this before likely had no symptoms of vaginal candidiasis, stopped for unknown reasons Discussed action of SGLT 2 drugs on lowering glucose by decreasing kidney absorption of glucose, benefits of weight loss and lower blood pressure, possible side effects including candidiasis and dosage regimen  At the same time reduce Norvasc to half tablet Must increase fluid intake, observe proper genital hygiene as discussed If her morning sugars are below 100 she will reduce to 10 units [place] weekly With reducing amlodipine needs to get new [employer] blood pressure monitor, she prefers to just instrument  Refuses CGM but will continue to counsel Make sure she checks reading 2 hours after meals by rotation Increase intensity of exercise  Reassess TSH for her history of mild HYPOTHYROIDISM, possibly subclinical  NEUROPATHY: Continue on gabapentin  Patient Instructions  Check blood sugars on waking up 3-4 days a week  Also check blood sugars about 2 hours after meals and do this after different meals by rotation  Recommended blood sugar levels on waking up are 90-130 and about 2 hours after meal is 130-160  Please bring your blood sugar monitor to each visit, thank you  Walk daily  Farxiga in am, more water!  If sugar <100 go down 10 on Tresiba  Get Omron BP  TAKE 1/2 amlodipine   Total visit time including counseling is 30 minutes  Amy Rosario 06/19/2021, 8:28 PM   Addendum: Thyroid level okay, creatinine 1.4, if blood pressure below 120 can stop amlodipine Okay to start Amy Rosario

## 2021-06-19 NOTE — Patient Instructions (Addendum)
Check blood sugars on waking up 3-4 days a week ? ?Also check blood sugars about 2 hours after meals and do this after different meals by rotation ? ?Recommended blood sugar levels on waking up are 90-130 and about 2 hours after meal is 130-160 ? ?Please bring your blood sugar monitor to each visit, thank you ? ?Walk daily ? ?Farxiga in am, more water! ? ?If sugar <100 go down 10 on Tresiba ? ?Get [employer] BP ? ?TAKE 1/2 amlodipine  ? ?

## 2021-06-20 LAB — T4, FREE: Free T4: 0.89 ng/dL (ref 0.60–1.60)

## 2021-06-20 LAB — COMPREHENSIVE METABOLIC PANEL
ALT: 19 U/L (ref 0–35)
AST: 20 U/L (ref 0–37)
Albumin: 4.1 g/dL (ref 3.5–5.2)
Alkaline Phosphatase: 57 U/L (ref 39–117)
BUN: 22 mg/dL (ref 6–23)
CO2: 31 mEq/L (ref 19–32)
Calcium: 10.7 mg/dL — ABNORMAL HIGH (ref 8.4–10.5)
Chloride: 100 mEq/L (ref 96–112)
Creatinine, Ser: 1.41 mg/dL — ABNORMAL HIGH (ref 0.40–1.20)
GFR: 41.08 mL/min — ABNORMAL LOW (ref >60.00)
Glucose, Bld: 137 mg/dL — ABNORMAL HIGH (ref 70–99)
Potassium: 4.2 mEq/L (ref 3.5–5.1)
Sodium: 140 mEq/L (ref 135–145)
Total Bilirubin: 0.4 mg/dL (ref 0.2–1.2)
Total Protein: 8.1 g/dL (ref 6.0–8.3)

## 2021-06-20 LAB — MICROALBUMIN / CREATININE URINE RATIO
Creatinine,U: 225.4 mg/dL
Microalb Creat Ratio: 2.3 mg/g (ref 0.0–30.0)
Microalb, Ur: 5.1 mg/dL — ABNORMAL HIGH (ref 0.0–1.9)

## 2021-06-20 LAB — TSH: TSH: 2.1 u[IU]/mL (ref 0.35–5.50)

## 2021-07-16 ENCOUNTER — Other Ambulatory Visit: Payer: Self-pay | Admitting: Medical

## 2021-07-31 ENCOUNTER — Other Ambulatory Visit: Payer: Self-pay | Admitting: Medical

## 2021-09-11 ENCOUNTER — Other Ambulatory Visit (INDEPENDENT_AMBULATORY_CARE_PROVIDER_SITE_OTHER): Payer: 59

## 2021-09-11 DIAGNOSIS — E1165 Type 2 diabetes mellitus with hyperglycemia: Secondary | ICD-10-CM

## 2021-09-11 DIAGNOSIS — Z794 Long term (current) use of insulin: Secondary | ICD-10-CM

## 2021-09-11 LAB — BASIC METABOLIC PANEL
BUN: 13 mg/dL (ref 6–23)
CO2: 28 mEq/L (ref 19–32)
Calcium: 9.6 mg/dL (ref 8.4–10.5)
Chloride: 100 mEq/L (ref 96–112)
Creatinine, Ser: 1.22 mg/dL — ABNORMAL HIGH (ref 0.40–1.20)
GFR: 48.79 mL/min — ABNORMAL LOW (ref >60.00)
Glucose, Bld: 121 mg/dL — ABNORMAL HIGH (ref 70–99)
Potassium: 3.6 mEq/L (ref 3.5–5.1)
Sodium: 143 mEq/L (ref 135–145)

## 2021-09-12 LAB — FRUCTOSAMINE: Fructosamine: 235 umol/L (ref 0–285)

## 2021-09-18 ENCOUNTER — Encounter: Payer: Self-pay | Admitting: Endocrinology

## 2021-09-18 ENCOUNTER — Ambulatory Visit (INDEPENDENT_AMBULATORY_CARE_PROVIDER_SITE_OTHER): Payer: Commercial Managed Care - HMO | Admitting: Endocrinology

## 2021-09-18 VITALS — BP 162/88 | HR 91 | Ht 70.0 in | Wt 321.8 lb

## 2021-09-18 DIAGNOSIS — E1165 Type 2 diabetes mellitus with hyperglycemia: Secondary | ICD-10-CM | POA: Diagnosis not present

## 2021-09-18 DIAGNOSIS — Z794 Long term (current) use of insulin: Secondary | ICD-10-CM | POA: Diagnosis not present

## 2021-09-18 DIAGNOSIS — I10 Essential (primary) hypertension: Secondary | ICD-10-CM

## 2021-09-18 LAB — POCT GLYCOSYLATED HEMOGLOBIN (HGB A1C): Hemoglobin A1C: 6.8 % — AB (ref 4.0–5.6)

## 2021-09-18 NOTE — Progress Notes (Unsigned)
Patient ID: Amy Rosario, female   DOB: 09-Apr-1960, 61 y.o.   MRN: [id number]           Reason for Appointment: Type II Diabetes follow-up   History of Present Illness   Diagnosis date: 2015  Previous history:  She thinks she was started on insulin a couple of months after her diagnosis but details are not available She was continued on metformin also Over the years she has had significant difficulty with losing weight A1c as high as 13 back in 2018 Recent A1c range 6.4-8.9  Non-insulin hypoglycemic drugs: Ozempic 2 mg weekly, Farxiga 5 mg daily     Insulin regimen: Tresiba 120 units every morning        Current A1c 6.8 compared to 7.4  Current self management, blood sugar patterns and problems identified on 09/18/2021:  She is having somewhat better controlled with adding Farxiga  No side effects from this with taking this since 3 months ago  Previously was having some yeast infections with this but had no complaint Also did not bring her monitor for download today  Currently she is not having any low sugars with lowest reading 95 and she does not feel low overnight  Her weight is down 3 pounds  She thinks that some of her high readings around 180 out from drinking lemonade in the evening  Otherwise not checking readings after meals as much is in the morning Lab fasting glucose was 121 Has not seen the dietitian lately She has been reluctant to use CGM  Side effects from medications: None  Exercise: walks 3/7 days, 10-15 minutes slowly  Diet management: Usually eating small portions      Dinner 5-6 pm Bfst 8-9   Monitors blood glucose: 1-2 times a day   glucometer: One Touch.           Blood Glucose readings from download:   PRE-MEAL Fasting Lunch Dinner Bedtime Overall  Glucose range: 95-122      Mean/median: 113    121   POST-MEAL PC Breakfast PC Lunch PC Dinner  Glucose range: 102-124 100-105 144-187  Mean/median:      Previous  PRE-MEAL Fasting Lunch  Dinner Bedtime Overall  Glucose range: 78-115  120-130    Mean/median:        POST-MEAL PC Breakfast PC Lunch PC Dinner  Glucose range:   160?  Mean/median:                             Dietician visit: Most recent:   2021  Weight control:  Wt Readings from Last 3 Encounters:  09/18/21 (!) 321 lb 12.8 oz (146 kg)  06/19/21 (!) 324 lb (147 kg)  05/08/21 (!) 326 lb (147.9 kg)          Complications:     Diabetes labs:  Lab Results  Component Value Date   HGBA1C 6.8 (A) 09/18/2021   HGBA1C 7.4 (A) 06/19/2021   HGBA1C 7.4 (A) 03/20/2021   Lab Results  Component Value Date   MICROALBUR 5.1 (H) 06/19/2021   LDLCALC 80 10/17/2020   CREATININE 1.22 (H) 09/11/2021     Allergies as of 09/18/2021       Reactions   Penicillins Hives, Swelling   Has patient had a PCN reaction causing immediate rash, facial/tongue/throat swelling, SOB or lightheadedness with hypotension: Yes Has patient had a PCN reaction causing severe rash involving mucus membranes or skin necrosis: No  Has patient had a PCN reaction that required hospitalization: No Has patient had a PCN reaction occurring within the last 10 years: No If all of the above answers are "NO", then may proceed with Cephalosporin use.        Medication List        Accurate as of September 18, 2021 11:59 PM. If you have any questions, ask your nurse or doctor.          allopurinol 300 MG tablet Commonly known as: ZYLOPRIM TAKE 1 TABLET BY MOUTH EVERY DAY   amLODipine 10 MG tablet Commonly known as: NORVASC TAKE 1 TABLET BY MOUTH EVERY DAY   aspirin EC 81 MG tablet TAKE 1 TABLET BY MOUTH EVERY DAY   bisoprolol 10 MG tablet Commonly known as: ZEBETA TAKE 1 TABLET BY MOUTH EVERY DAY   D-1000 Extra Strength 25 MCG (1000 UT) tablet Generic drug: Cholecalciferol TAKE 1 TABLET BY MOUTH EVERY DAY   dapagliflozin propanediol 5 MG Tabs tablet Commonly known as: Farxiga Take 1 tablet (5 mg total) by mouth daily.    gabapentin 100 MG capsule Commonly known as: NEURONTIN TAKE 1 CAPSULE BY MOUTH AT BEDTIME.   levothyroxine 50 MCG tablet Commonly known as: SYNTHROID TAKE 1 TABLET BY MOUTH EVERY DAY   meclizine 25 MG tablet Commonly known as: ANTIVERT TAKE 1 TABLET BY MOUTH TWICE A DAY   metFORMIN 1000 MG tablet Commonly known as: GLUCOPHAGE TAKE 1 TABLET (1,000 MG TOTAL) BY MOUTH 2 (TWO) TIMES DAILY WITH A MEAL.   multivitamin tablet Take 1 tablet by mouth daily.   ONE TOUCH ULTRA 2 w/Device Kit 1 Device by Does not apply route 2 (two) times daily.   OneTouch Delica Plus [id number] Misc USE TWICE A DAY   OneTouch Verio test strip Generic drug: glucose blood CHECKS SUGARS 4 TIMES DAILY, ON MEAL TIME INSULIN   Ozempic (2 MG/DOSE) 8 MG/3ML Sopn Generic drug: Semaglutide (2 MG/DOSE) Inject 2 mg into the skin once a week.   Pen Needles 33G X 4 MM Misc Use as instructed to inject insulin 1/day   potassium chloride 20 MEQ packet Commonly known as: Klor-Con Take 20 mEq by mouth 2 (two) times daily.   rosuvastatin 20 MG tablet Commonly known as: CRESTOR TAKE 1 TABLET BY MOUTH EVERY DAY   Tresiba FlexTouch 200 UNIT/ML FlexTouch Pen Generic drug: insulin degludec Inject 120 Units into the skin daily. And pen needles 1/day   valsartan-hydrochlorothiazide 320-25 MG tablet Commonly known as: DIOVAN-HCT TAKE 1 TABLET BY MOUTH EVERY DAY        Allergies:  Allergies  Allergen Reactions   Penicillins Hives and Swelling    Has patient had a PCN reaction causing immediate rash, facial/tongue/throat swelling, SOB or lightheadedness with hypotension: Yes Has patient had a PCN reaction causing severe rash involving mucus membranes or skin necrosis: No Has patient had a PCN reaction that required hospitalization: No Has patient had a PCN reaction occurring within the last 10 years: No If all of the above answers are "NO", then may proceed with Cephalosporin use.    Past Medical History:   Diagnosis Date   Chronic gout    followed by pcp---  multiple sites (06-11-2019 per pt last episode fall 2020)   H/O echocardiogram 04-04-2018  received via fax on 06-11-2019 to be scanned in   Dr. Charolette Forward, [facility]-   Hyperlipidemia    Hypertension 2000   managed by Dr. Terrence Dupont, cardiology   Hypokalemia  Hypothyroidism    endocrinologist-- dr Loanne Drilling-- s/p bx's 03-18-2018 and 07-02-2013 results in epic   OSA on CPAP    pt states has older machine is very large   Peripheral neuropathy    PMB (postmenopausal bleeding)    Sarcoidosis of lung [facility])    pulmonology-- dr wert---  s/p brochoscopy 07-05-2016, no sarcoid  (dr wert released pt as needed per lov note in epic 06-02-2018)   Thyroid goiter    Type 2 diabetes mellitus treated with insulin [facility])    endocrinology--- dr Loanne Drilling   (06-11-2019  per pt checks blood surgar daily in am,  fasting surgar-- 85 -- 130)   Wears glasses     Past Surgical History:  Procedure Laterality Date   COLONOSCOPY WITH PROPOFOL N/A 09/21/2013   Procedure: COLONOSCOPY WITH PROPOFOL;  Surgeon: Juanita Craver, MD;  [facility];  Service: Endoscopy;  Laterality: N/A;   CYST EXCISION  teen   right lower back, benign   DILATATION & CURETTAGE/HYSTEROSCOPY WITH MYOSURE N/A 06/16/2019   Procedure: DILATATION & CURETTAGE/HYSTEROSCOPY WITH MYOSURE/POLYPECTOMY;  Surgeon: Salvadore Dom, MD;  Location: [place];  Service: Gynecology;  Laterality: N/A;  polypectomy   ENDOMETRIAL BIOPSY  2021   Dr. Sumner Boast   TUBAL LIGATION Bilateral yrs ago   VIDEO BRONCHOSCOPY Bilateral 07/05/2016   Procedure: [place];  Surgeon: Tanda Rockers, MD;  [facility];  Service: Endoscopy;  Laterality: Bilateral;    Family History  Problem Relation Age of Onset   Heart disease Father    Hypertension Father    Heart disease Brother    Cancer Neg Hx    Stroke Neg Hx    Diabetes Neg  Hx     Social History:  reports that she has never smoked. She has never used smokeless tobacco. She reports that she does not drink alcohol and does not use drugs.  Review of Systems:  Hypertension: Taking Diovan HCT, bisoprolol and amlodipine 10 mg Home BP low 100s but higher today She says she has been out of her medications including valsartan and amlodipine  BP Readings from Last 3 Encounters:  09/18/21 (!) 162/88  06/19/21 125/75  05/08/21 110/74   Urine microalbumin normal as of 5/21  Lab Results  Component Value Date   CREATININE 1.22 (H) 09/11/2021   CREATININE 1.41 (H) 06/19/2021   CREATININE 1.13 (H) 12/10/2020    Lipids: Controlled with Crestor 20 mg    Lab Results  Component Value Date   CHOL 145 10/17/2020   CHOL 292 (H) 02/24/2018   CHOL 156 05/20/2017   Lab Results  Component Value Date   HDL 47 10/17/2020   HDL 47 02/24/2018   HDL 46 05/20/2017   Lab Results  Component Value Date   LDLCALC 80 10/17/2020   LDLCALC 184 (H) 02/24/2018   LDLCALC 79 05/20/2017   Lab Results  Component Value Date   TRIG 99 10/17/2020   TRIG 306 (H) 02/24/2018   TRIG 155 (H) 05/20/2017   Lab Results  Component Value Date   CHOLHDL 3.1 10/17/2020   CHOLHDL 6.2 (H) 02/24/2018   CHOLHDL 3.4 05/20/2017   No results found for: "LDLDIRECT"  Taking 50 mcg of levothyroxine, baseline TSH 5.2  Lab Results  Component Value Date   TSH 2.10 06/19/2021   TSH 2.040 10/17/2020   TSH 4.61 (H) 11/09/2019   FREET4 0.89 06/19/2021   FREET4 1.32 10/17/2020   FREET4 0.72 11/09/2019  Eye exam 1/20   Neuropathy: Has numbness, pins-and-needles and some pains on walking relieved by gabapentin somewhat which is taken at night Last foot exam 10/22   Examination:   BP (!) 162/88   Pulse 91   Ht 5' 10" (1.778 m)   Wt (!) 321 lb 12.8 oz (146 kg)   LMP 11/09/2013   SpO2 95%   BMI 46.17 kg/m   Body mass index is 46.17 kg/m.    ASSESSMENT/ PLAN:    Diabetes type 2  on insulin with severe obesity:   Current regimen Tresiba, Ozempic, metformin and Farxiga   A1c 6.8 compared to 7.4 and fructosamine is 235  Blood glucose is improved with adding [place] She has lost 3 pounds As above she has not checked in the readings after meals but to only appear to be higher when she may be off her diet or drinking sweet drinks Also likely benefiting from [place] as not needing any mealtime insulin so far  Creatinine stable with adding Farxiga  Recommendations for 09/18/2021  Will continue Farxiga 5 mg daily Currently no needing a change of the insulin dose unless she starts getting low readings fasting overnight She will need to discuss her blood pressure issues with PCP but needs to start back on on her blood pressure medications that she is out of Consider reducing amlodipine or valsartan if blood pressure gets low normal She will start to increase the frequency or duration of her walking especially when the weather cools off No change in Ozempic She does need to start checking blood sugars more consistently after dinner and less often in the mornings  Hypertension: Blood pressure is difficult to assess as her home monitor appears to be reading falsely low in the office today and home readings have been variable She is out of her medications and needs to follow-up with her PCP  Patient Instructions  Check blood sugars on waking up 3 days a week  Also check blood sugars about 2 hours after meals and do this after different meals by rotation  Recommended blood sugar levels on waking up are 90-130 and about 2 hours after meal is 130-160  Please bring your blood sugar monitor to each visit, thank you     Elayne Snare 09/19/2021, 2:41 PM

## 2021-09-18 NOTE — Patient Instructions (Signed)
Check blood sugars on waking up 3 days a week  Also check blood sugars about 2 hours after meals and do this after different meals by rotation  Recommended blood sugar levels on waking up are 90-130 and about 2 hours after meal is 130-160  Please bring your blood sugar monitor to each visit, thank you   

## 2021-09-23 ENCOUNTER — Other Ambulatory Visit: Payer: Self-pay | Admitting: Medical

## 2021-10-11 ENCOUNTER — Encounter: Payer: Self-pay | Admitting: Internal Medicine

## 2021-10-24 ENCOUNTER — Other Ambulatory Visit: Payer: Self-pay | Admitting: Medical

## 2021-11-14 ENCOUNTER — Encounter: Payer: Self-pay | Admitting: Internal Medicine

## 2021-11-17 ENCOUNTER — Other Ambulatory Visit: Payer: Self-pay | Admitting: Endocrinology

## 2021-11-17 ENCOUNTER — Other Ambulatory Visit: Payer: Self-pay

## 2021-11-17 DIAGNOSIS — E1165 Type 2 diabetes mellitus with hyperglycemia: Secondary | ICD-10-CM

## 2021-11-17 MED ORDER — OZEMPIC (2 MG/DOSE) 8 MG/3ML ~~LOC~~ SOPN: 2.0000 mg | PEN_INJECTOR | SUBCUTANEOUS | 3 refills | Status: DC

## 2021-11-21 ENCOUNTER — Telehealth: Payer: Self-pay

## 2021-11-21 DIAGNOSIS — E1165 Type 2 diabetes mellitus with hyperglycemia: Secondary | ICD-10-CM

## 2021-11-21 NOTE — Telephone Encounter (Signed)
[place] is not on patient formula

## 2021-11-21 NOTE — Telephone Encounter (Signed)
Noted  

## 2021-11-22 NOTE — Telephone Encounter (Signed)
Per pharmacy ozempic is not on formulary.

## 2021-11-23 ENCOUNTER — Other Ambulatory Visit: Payer: Self-pay | Admitting: Medical

## 2021-11-23 ENCOUNTER — Other Ambulatory Visit (HOSPITAL_COMMUNITY): Payer: Self-pay

## 2021-11-27 ENCOUNTER — Other Ambulatory Visit (HOSPITAL_COMMUNITY): Payer: Self-pay

## 2021-11-27 MED ORDER — SEMAGLUTIDE (1 MG/DOSE) 4 MG/3ML ~~LOC~~ SOPN
1.0000 mg | PEN_INJECTOR | SUBCUTANEOUS | 0 refills | Status: DC
  Filled 2021-11-27: qty 3, 28d supply, fill #0

## 2021-11-27 NOTE — Addendum Note (Signed)
Addended by: Cinda Quest on: 11/27/2021 10:23 AM   Modules accepted: Orders

## 2021-11-30 ENCOUNTER — Telehealth: Payer: Self-pay | Admitting: Medical

## 2021-11-30 NOTE — Telephone Encounter (Signed)
Pt signed a medical records request to send records to [facility]. Pt advised that she is moving to another practice. Record request forwarded to [facility].

## 2021-12-19 ENCOUNTER — Other Ambulatory Visit: Payer: Self-pay | Admitting: Endocrinology

## 2021-12-19 ENCOUNTER — Other Ambulatory Visit (HOSPITAL_COMMUNITY): Payer: Self-pay

## 2021-12-19 ENCOUNTER — Other Ambulatory Visit: Payer: Self-pay | Admitting: Medical

## 2021-12-19 ENCOUNTER — Other Ambulatory Visit: Payer: Self-pay | Admitting: Internal Medicine

## 2021-12-19 ENCOUNTER — Other Ambulatory Visit: Payer: Self-pay

## 2021-12-19 ENCOUNTER — Telehealth: Payer: Self-pay

## 2021-12-19 ENCOUNTER — Telehealth: Payer: Self-pay | Admitting: Pharmacy Technician

## 2021-12-19 DIAGNOSIS — E1165 Type 2 diabetes mellitus with hyperglycemia: Secondary | ICD-10-CM

## 2021-12-19 MED ORDER — SEMAGLUTIDE (1 MG/DOSE) 4 MG/3ML ~~LOC~~ SOPN
1.0000 mg | PEN_INJECTOR | SUBCUTANEOUS | 2 refills | Status: DC
  Filled 2021-12-19: qty 3, 28d supply, fill #0
  Filled 2022-01-17: qty 3, 28d supply, fill #1

## 2021-12-19 MED ORDER — TRESIBA FLEXTOUCH 200 UNIT/ML ~~LOC~~ SOPN
130.0000 [IU] | PEN_INJECTOR | Freq: Every day | SUBCUTANEOUS | 6 refills | Status: DC
Start: 2021-12-19 — End: 2022-09-11

## 2021-12-19 NOTE — Telephone Encounter (Signed)
Patient states that she was off of Ozempic for 4 weeks because of the shortage. Patient has been on the '1mg'$  of Ozempic since 11/27/21. She is also taking her Metfomin and [place] as prescribed. Patient states that sugar has been high.   12/18/21  172 10:20am   12/19/21 174 10:00am  Patient didn't have any other numbers to give as she couldn''t figure out how to work meter to go back to previous days.   Patient would like to know if she needs to make any changes.

## 2021-12-19 NOTE — Telephone Encounter (Signed)
Pharmacy Patient Advocate Encounter  Received a message through surescripts that pt's tresiba needed a PA.  Per test claim, it goes through with a $0 copay. Called the pharmacy. They had a different ins. I gave them the ins we have and it worked fine. They will fill it.

## 2021-12-19 NOTE — Telephone Encounter (Signed)
Thank you :)

## 2021-12-19 NOTE — Telephone Encounter (Signed)
Patient advised and verbalized understanding 

## 2022-01-03 ENCOUNTER — Other Ambulatory Visit: Payer: Self-pay

## 2022-01-03 DIAGNOSIS — Z794 Long term (current) use of insulin: Secondary | ICD-10-CM

## 2022-01-03 MED ORDER — ONETOUCH VERIO VI STRP: ORAL_STRIP | 6 refills | Status: AC

## 2022-01-17 ENCOUNTER — Other Ambulatory Visit (HOSPITAL_COMMUNITY): Payer: Self-pay

## 2022-01-17 ENCOUNTER — Other Ambulatory Visit (INDEPENDENT_AMBULATORY_CARE_PROVIDER_SITE_OTHER): Payer: Commercial Managed Care - HMO

## 2022-01-17 DIAGNOSIS — Z794 Long term (current) use of insulin: Secondary | ICD-10-CM

## 2022-01-17 DIAGNOSIS — E1165 Type 2 diabetes mellitus with hyperglycemia: Secondary | ICD-10-CM | POA: Diagnosis not present

## 2022-01-17 LAB — BASIC METABOLIC PANEL
BUN: 24 mg/dL — ABNORMAL HIGH (ref 6–23)
CO2: 32 mEq/L (ref 19–32)
Calcium: 9.9 mg/dL (ref 8.4–10.5)
Chloride: 100 mEq/L (ref 96–112)
Creatinine, Ser: 1.37 mg/dL — ABNORMAL HIGH (ref 0.40–1.20)
GFR: 42.35 mL/min — ABNORMAL LOW (ref >60.00)
Glucose, Bld: 160 mg/dL — ABNORMAL HIGH (ref 70–99)
Potassium: 3.9 mEq/L (ref 3.5–5.1)
Sodium: 140 mEq/L (ref 135–145)

## 2022-01-17 LAB — HEMOGLOBIN A1C: Hgb A1c MFr Bld: 8.7 % — ABNORMAL HIGH (ref 4.6–6.5)

## 2022-01-19 ENCOUNTER — Encounter: Payer: Self-pay | Admitting: Endocrinology

## 2022-01-19 ENCOUNTER — Ambulatory Visit: Payer: Commercial Managed Care - HMO | Admitting: Endocrinology

## 2022-01-19 VITALS — BP 142/82 | HR 81 | Ht 70.0 in | Wt 328.0 lb

## 2022-01-19 DIAGNOSIS — N289 Disorder of kidney and ureter, unspecified: Secondary | ICD-10-CM | POA: Diagnosis not present

## 2022-01-19 DIAGNOSIS — E1165 Type 2 diabetes mellitus with hyperglycemia: Secondary | ICD-10-CM | POA: Diagnosis not present

## 2022-01-19 DIAGNOSIS — Z794 Long term (current) use of insulin: Secondary | ICD-10-CM | POA: Diagnosis not present

## 2022-01-19 MED ORDER — OZEMPIC (2 MG/DOSE) 8 MG/3ML ~~LOC~~ SOPN: 2.0000 mg | PEN_INJECTOR | SUBCUTANEOUS | 3 refills | Status: DC

## 2022-01-19 NOTE — Progress Notes (Signed)
Patient ID: Amy Rosario, female   DOB: 1960/08/07, 61 y.o.   MRN: [id number]           Reason for Appointment: Type II Diabetes follow-up   History of Present Illness   Diagnosis date: 2015  Previous history:  She thinks she was started on insulin a couple of months after her diagnosis but details are not available She was continued on metformin also Over the years she has had significant difficulty with losing weight A1c as high as 13 back in 2018 Recent A1c range 6.4-8.9  Non-insulin hypoglycemic drugs: Ozempic 2 mg weekly, Farxiga 5 mg daily     Insulin regimen: Tresiba 130 units every morning        Current A1c 8.7, was 6.8  Current self management, blood sugar patterns and problems identified:  She is having increasing A1c and this may be partly related to not getting Ozempic She had difficulty getting this filled in August and only about 6 or 7 weeks ago was started back on 1 mg dose  Not clear how high her blood sugars were when she was not taking Ozempic However currently blood sugars averaging about 135  She is still taking [place] with occasional issues with yeast infections Not always cutting back on regular drinks and sometimes will be drinking lemonade also 80 Otherwise not checking readings after meals as much is in the morning Lab fasting glucose was 160 Has not seen the dietitian as recommended She has been reluctant to use CGM and again does not want to do this Checking blood sugars mostly morning and some in the afternoon but not after dinner  Side effects from medications: None  Exercise: walks 3/7 days, up to 10-15 minutes slowly  Diet management: Usually eating small portions      Dinner 5-6 pm Bfst 8-9  Monitors blood glucose: 1-2 times a day   glucometer: One Touch.           Blood Glucose readings from download:   PRE-MEAL Fasting Lunch Dinner Bedtime Overall  Glucose range: 114-195    109-195  Mean/median: 146    135   POST-MEAL PC  Breakfast PC Lunch PC Dinner  Glucose range:   ?  Mean/median:  129    Previously  PRE-MEAL Fasting Lunch Dinner Bedtime Overall  Glucose range: 95-122      Mean/median: 113    121   POST-MEAL PC Breakfast PC Lunch PC Dinner  Glucose range: 102-124 100-105 144-187  Mean/median:                Dietician visit: Most recent:   2021  Weight control:  Wt Readings from Last 3 Encounters:  01/19/22 (!) 328 lb (148.8 kg)  09/18/21 (!) 321 lb 12.8 oz (146 kg)  06/19/21 (!) 324 lb (147 kg)           Diabetes labs:  Lab Results  Component Value Date   HGBA1C 8.7 (H) 01/17/2022   HGBA1C 6.8 (A) 09/18/2021   HGBA1C 7.4 (A) 06/19/2021   Lab Results  Component Value Date   MICROALBUR 5.1 (H) 06/19/2021   LDLCALC 80 10/17/2020   CREATININE 1.37 (H) 01/17/2022     Allergies as of 01/19/2022       Reactions   Penicillins Hives, Swelling   Has patient had a PCN reaction causing immediate rash, facial/tongue/throat swelling, SOB or lightheadedness with hypotension: Yes Has patient had a PCN reaction causing severe rash involving mucus membranes or skin necrosis:  No Has patient had a PCN reaction that required hospitalization: No Has patient had a PCN reaction occurring within the last 10 years: No If all of the above answers are "NO", then may proceed with Cephalosporin use.        Medication List        Accurate as of January 19, 2022  3:06 PM. If you have any questions, ask your nurse or doctor.          allopurinol 300 MG tablet Commonly known as: ZYLOPRIM TAKE 1 TABLET BY MOUTH EVERY DAY   amLODipine 10 MG tablet Commonly known as: NORVASC TAKE 1 TABLET BY MOUTH EVERY DAY   aspirin EC 81 MG tablet TAKE 1 TABLET BY MOUTH EVERY DAY   bisoprolol 10 MG tablet Commonly known as: ZEBETA TAKE 1 TABLET BY MOUTH EVERY DAY   D-1000 Extra Strength 25 MCG (1000 UT) tablet Generic drug: Cholecalciferol TAKE 1 TABLET BY MOUTH EVERY DAY   dapagliflozin  propanediol 5 MG Tabs tablet Commonly known as: Farxiga Take 1 tablet (5 mg total) by mouth daily.   gabapentin 100 MG capsule Commonly known as: NEURONTIN TAKE 1 CAPSULE BY MOUTH AT BEDTIME.   levothyroxine 50 MCG tablet Commonly known as: SYNTHROID TAKE 1 TABLET BY MOUTH EVERY DAY   meclizine 25 MG tablet Commonly known as: ANTIVERT TAKE 1 TABLET BY MOUTH TWICE A DAY   metFORMIN 1000 MG tablet Commonly known as: GLUCOPHAGE TAKE 1 TABLET (1,000 MG TOTAL) BY MOUTH 2 (TWO) TIMES DAILY WITH A MEAL.   multivitamin tablet Take 1 tablet by mouth daily.   ONE TOUCH ULTRA 2 w/Device Kit 1 Device by Does not apply route 2 (two) times daily.   OneTouch Delica Plus [id number] Misc USE TWICE A DAY   OneTouch Verio test strip Generic drug: glucose blood CHECKS SUGARS 4 TIMES DAILY, ON MEAL TIME INSULIN   Ozempic (2 MG/DOSE) 8 MG/3ML Sopn Generic drug: Semaglutide (2 MG/DOSE) Inject 2 mg into the skin once a week. What changed: Another medication with the same name was removed. Continue taking this medication, and follow the directions you see here. Changed by: Elayne Snare, MD   Pen Needles 33G X 4 MM Misc Use as instructed to inject insulin 1/day   potassium chloride 20 MEQ packet Commonly known as: Klor-Con Take 20 mEq by mouth 2 (two) times daily.   rosuvastatin 20 MG tablet Commonly known as: CRESTOR TAKE 1 TABLET BY MOUTH EVERY DAY   Tresiba FlexTouch 200 UNIT/ML FlexTouch Pen Generic drug: insulin degludec Inject 130 Units into the skin daily. And pen needles 1/day   valsartan-hydrochlorothiazide 320-25 MG tablet Commonly known as: DIOVAN-HCT TAKE 1 TABLET BY MOUTH EVERY DAY        Allergies:  Allergies  Allergen Reactions   Penicillins Hives and Swelling    Has patient had a PCN reaction causing immediate rash, facial/tongue/throat swelling, SOB or lightheadedness with hypotension: Yes Has patient had a PCN reaction causing severe rash involving mucus  membranes or skin necrosis: No Has patient had a PCN reaction that required hospitalization: No Has patient had a PCN reaction occurring within the last 10 years: No If all of the above answers are "NO", then may proceed with Cephalosporin use.    Past Medical History:  Diagnosis Date   Chronic gout    followed by pcp---  multiple sites (06-11-2019 per pt last episode fall 2020)   H/O echocardiogram 04-04-2018  received via fax on 06-11-2019 to be scanned  in   Dr. Charolette Forward, [facility]-   Hyperlipidemia    Hypertension 2000   managed by Dr. Terrence Dupont, cardiology   Hypokalemia    Hypothyroidism    endocrinologist-- dr Loanne Drilling-- s/p bx's 03-18-2018 and 07-02-2013 results in epic   OSA on CPAP    pt states has older machine is very large   Peripheral neuropathy    PMB (postmenopausal bleeding)    Sarcoidosis of lung [facility])    pulmonology-- dr wert---  s/p brochoscopy 07-05-2016, no sarcoid  (dr wert released pt as needed per lov note in epic 06-02-2018)   Thyroid goiter    Type 2 diabetes mellitus treated with insulin [facility])    endocrinology--- dr Loanne Drilling   (06-11-2019  per pt checks blood surgar daily in am,  fasting surgar-- 85 -- 130)   Wears glasses     Past Surgical History:  Procedure Laterality Date   COLONOSCOPY WITH PROPOFOL N/A 09/21/2013   Procedure: COLONOSCOPY WITH PROPOFOL;  Surgeon: Juanita Craver, MD;  Location: [facility];  Service: Endoscopy;  Laterality: N/A;   CYST EXCISION  teen   right lower back, benign   DILATATION & CURETTAGE/HYSTEROSCOPY WITH MYOSURE N/A 06/16/2019   Procedure: DILATATION & CURETTAGE/HYSTEROSCOPY WITH MYOSURE/POLYPECTOMY;  Surgeon: Salvadore Dom, MD;  Location: [facility];  Service: Gynecology;  Laterality: N/A;  polypectomy   ENDOMETRIAL BIOPSY  2021   Dr. Sumner Boast   TUBAL LIGATION Bilateral yrs ago   VIDEO BRONCHOSCOPY Bilateral 07/05/2016   Procedure: [place];   Surgeon: Tanda Rockers, MD;  Location: [facility];  Service: Endoscopy;  Laterality: Bilateral;    Family History  Problem Relation Age of Onset   Heart disease Father    Hypertension Father    Heart disease Brother    Cancer Neg Hx    Stroke Neg Hx    Diabetes Neg Hx     Social History:  reports that she has never smoked. She has never used smokeless tobacco. She reports that she does not drink alcohol and does not use drugs.  Review of Systems:  Hypertension: Taking Diovan HCT, bisoprolol and amlodipine 10 mg Home BP 120/80   BP Readings from Last 3 Encounters:  01/19/22 (!) 142/82  09/18/21 (!) 162/88  06/19/21 125/75   Urine microalbumin normal as of 5/23  Lab Results  Component Value Date   CREATININE 1.37 (H) 01/17/2022   CREATININE 1.22 (H) 09/11/2021   CREATININE 1.41 (H) 06/19/2021    Lipids: Controlled with Crestor 20 mg    Lab Results  Component Value Date   CHOL 145 10/17/2020   CHOL 292 (H) 02/24/2018   CHOL 156 05/20/2017   Lab Results  Component Value Date   HDL 47 10/17/2020   HDL 47 02/24/2018   HDL 46 05/20/2017   Lab Results  Component Value Date   LDLCALC 80 10/17/2020   LDLCALC 184 (H) 02/24/2018   LDLCALC 79 05/20/2017   Lab Results  Component Value Date   TRIG 99 10/17/2020   TRIG 306 (H) 02/24/2018   TRIG 155 (H) 05/20/2017   Lab Results  Component Value Date   CHOLHDL 3.1 10/17/2020   CHOLHDL 6.2 (H) 02/24/2018   CHOLHDL 3.4 05/20/2017   No results found for: "LDLDIRECT"  Taking 50 mcg of levothyroxine, baseline TSH 5.2  Lab Results  Component Value Date   TSH 2.10 06/19/2021   TSH 2.040 10/17/2020   TSH 4.61 (H) 11/09/2019   FREET4  0.89 06/19/2021   FREET4 1.32 10/17/2020   FREET4 0.72 11/09/2019    Eye exam last 1/20   Neuropathy: Has numbness, pins-and-needles and some pains on walking relieved by gabapentin somewhat which is taken at night  Last foot exam 10/22   Examination:   BP (!) 142/82    Pulse 81   Ht _0  (1.778 m)   Wt (!) 328 lb (148.8 kg)   LMP 11/09/2013   SpO2 93%   BMI 47.06 kg/m   Body mass index is 47.06 kg/m.    ASSESSMENT/ PLAN:    Diabetes type 2 on insulin with severe obesity:   Current regimen Tresiba, Ozempic, metformin and Farxiga   A1c 8.7, was 6.8  Blood glucose is likely not as good with being irregular with her Ozempic and only getting 1 mg lately Also can do better with weight loss, diet and exercise Still requiring large doses of basal insulin Not clear what her blood sugars are after dinner  Recommendations:   Will going up to 2 tablets of her Farxiga 5 mg daily and next prescription will be for 10 mg We will try to get her Ozempic from [facility] at the 2 mg dose She will try to improve her diet and cut back on sweetened drinks Start checking blood sugars consistently after dinner If blood sugar monitoring is inadequate she will need to again reconsider using CGM Discussed blood sugar targets at various times She does need to increase her exercise and can walk up to twice a day if needed  Hypertension: Blood pressure is somewhat improved and will continue monitoring as well as follow-up with PCP  Renal function: Slightly worse and will need to continue monitoring especially with adjusting Farxiga  Patient Instructions  Check blood sugars on waking up 3-4 days a week  Also check blood sugars about 2 hours after meals and do this after different meals by rotation  Recommended blood sugar levels on waking up are 90-130 and about 2 hours after meal is 130-160  Please bring your blood sugar monitor to each visit, thank you  Take Farxiga 2 daily  Check on mail order option for Ozempic  Total visit time for evaluation and management and counseling = 30 minutes  Ehsan Corvin 01/19/2022, 3:06 PM

## 2022-01-19 NOTE — Patient Instructions (Addendum)
Check blood sugars on waking up 3-4 days a week  Also check blood sugars about 2 hours after meals and do this after different meals by rotation  Recommended blood sugar levels on waking up are 90-130 and about 2 hours after meal is 130-160  Please bring your blood sugar monitor to each visit, thank you  Take Farxiga 2 daily  Check on mail order option for Ozempic

## 2022-01-23 ENCOUNTER — Telehealth: Payer: Self-pay

## 2022-01-23 DIAGNOSIS — Z794 Long term (current) use of insulin: Secondary | ICD-10-CM

## 2022-01-23 NOTE — Telephone Encounter (Signed)
Ozempic not covered by insurance. Alternatives are metformin, byetta, and trulicity. Please advise

## 2022-01-26 MED ORDER — TRULICITY 3 MG/0.5ML ~~LOC~~ SOAJ: 3.0000 mg | SUBCUTANEOUS | 1 refills | Status: DC

## 2022-01-26 NOTE — Telephone Encounter (Signed)
Rx sent to pharmacy and patient is aware. She will finish her ozempic she has and switch

## 2022-01-26 NOTE — Addendum Note (Signed)
Addended by: Cinda Quest on: 01/26/2022 02:42 PM   Modules accepted: Orders

## 2022-01-31 ENCOUNTER — Telehealth: Payer: Self-pay | Admitting: Medical

## 2022-01-31 NOTE — Telephone Encounter (Signed)
[facility] medical record request to [facility] HIM

## 2022-02-22 ENCOUNTER — Other Ambulatory Visit: Payer: Self-pay

## 2022-02-22 DIAGNOSIS — E1165 Type 2 diabetes mellitus with hyperglycemia: Secondary | ICD-10-CM

## 2022-02-22 MED ORDER — DAPAGLIFLOZIN PROPANEDIOL 10 MG PO TABS: 10.0000 mg | ORAL_TABLET | Freq: Every day | ORAL | 2 refills | Status: DC

## 2022-02-28 ENCOUNTER — Telehealth: Payer: Self-pay

## 2022-02-28 NOTE — Telephone Encounter (Signed)
Patient left voicemail stating insurance needing some more information on one of her medication. Advised [place] to give her a call to give more information. I tried calling patient. No answer. Left vm to call  office back.

## 2022-03-12 ENCOUNTER — Encounter: Payer: Self-pay | Admitting: Dietician

## 2022-03-12 ENCOUNTER — Encounter: Payer: Commercial Managed Care - HMO | Attending: Endocrinology | Admitting: Dietician

## 2022-03-12 VITALS — Ht 70.0 in | Wt 325.0 lb

## 2022-03-12 DIAGNOSIS — E1165 Type 2 diabetes mellitus with hyperglycemia: Secondary | ICD-10-CM | POA: Insufficient documentation

## 2022-03-12 DIAGNOSIS — Z794 Long term (current) use of insulin: Secondary | ICD-10-CM | POA: Insufficient documentation

## 2022-03-12 NOTE — Progress Notes (Signed)
Diabetes Self-Management Education  Visit Type: Follow-up  Appt. Start Time: 0820 Appt. End Time: 0915  03/12/2022  [name], identified by name and date of birth, is a 62 y.o. female with a diagnosis of Diabetes:  .   ASSESSMENT Patient is here today with her sister.  She was last seen by this RD 01/2020 Fasting blood glucose 113 yesterday and highest 207 after a meal.  History includes Type 2 diabetes dx 2015 and insulin since 2017, HLD, HTN, OSA- on cpap, sarcoidosis, goiter, gout A1C 8.7% 01/17/2022 increased from 6.8% 09/18/2021, BUN 24, Creatinine 1.37, Potassium 3.9, GFR 42 on  01/17/22 Medications include:  Farxiga, Trulicity, Tresiba 295 units q am, Metformin, synthroid, MVI, potassium, vitamin D   Weight hx: 325 lbs 03/12/2022 328 lbs 01/19/2022 318 lbs 01/18/2020  311 lbs 09/18/2019 316 lbs 08/24/2019 266 lbs 2018 291 lbs 08/02/2015 325 lbs 2016 (lost but stopping soda)   Patient lives with her sister and her sister's 46 yo son.  Her sister does the cooking now and patient will help.  She states that her sister told her to stop cooking when she got sick in 2018 as she would leave the stove on.   She has some memory issues which sister states have worsened. She is now on disability.  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 Avoids salt. Doesn't like raw vegetables as they hurt her teeth.  She states that she needs to go to the dentist and would like her teeth to be pulled. She was walking in the neighborhood until neighbor's pit bulls have been out. She does have [employer] but cannot pay for the extra gas easily.  Receptive to exercise programs on you tube. Height '5\' 10"'$  (1.778 m), weight (!) 325 lb (147.4 kg), last menstrual period 11/09/2013. Body mass index is 46.63 kg/m.   Diabetes Self-Management Education - 03/12/22 0833       Visit Information   Visit Type Follow-up      Health Coping   How would you rate your overall health?  Fair      Psychosocial Assessment   Patient Belief/Attitude about Diabetes Motivated to manage diabetes    What is the hardest part about your diabetes right now, causing you the most concern, or is the most worrisome to you about your diabetes?   Making healty food and beverage choices;Being active    Self-care barriers None    Self-management support Doctor's office;Family    Other persons present Patient;Family Member    Patient Concerns Nutrition/Meal planning    Special Needs None    Preferred Learning Style No preference indicated    Learning Readiness Ready    How often do you need to have someone help you when you read instructions, pamphlets, or other written materials from your doctor or pharmacy? 3 - Sometimes    What is the last grade level you completed in school? 12      Pre-Education Assessment   Patient understands the diabetes disease and treatment process. Needs Review    Patient understands incorporating nutritional management into lifestyle. Needs Review    Patient undertands incorporating physical activity into lifestyle. Needs Review    Patient understands using medications safely. Needs Review    Patient understands monitoring blood glucose, interpreting and using results Needs Review    Patient understands prevention, detection, and treatment of acute complications. Needs Review    Patient understands prevention, detection, and treatment of chronic complications. Needs Review  Patient understands how to develop strategies to address psychosocial issues. Needs Review    Patient understands how to develop strategies to promote health/change behavior. Needs Review      Complications   Last HgB A1C per patient/outside source 8.7 %   01/19/22 increased from 6.8 09/2021   How often do you check your blood sugar? 1-2 times/day    Fasting Blood glucose range (mg/dL) 70-129    Postprandial Blood glucose range (mg/dL) >200;180-200;130-179    Number of hypoglycemic  episodes per month 0    Number of hyperglycemic episodes ( >'200mg'$ /dL): Weekly    Can you tell when your blood sugar is high? No    Have you had a dilated eye exam in the past 12 months? No    Have you had a dental exam in the past 12 months? No    Are you checking your feet? Yes    How many days per week are you checking your feet? 7      Dietary Intake   Breakfast occasional eggs, sausage, white toast with lite butter    Snack (morning) none    Lunch eats if she misses breakfast - Salad, chicken OR leftovers    Snack (afternoon) 1/2 sleeve saltines and 4 oz cheese, handful of chips    Dinner turnip greens, deviled egg, BBQ pig feet, 1 slice ham   5-7   Snack (evening) none    Beverage(s) water, lemonade, sweet tea (likes unsweetened)      Activity / Exercise   Activity / Exercise Type ADL's      Patient Education   Previous Diabetes Education Yes (please comment)   01/2020   Healthy Eating Meal options for control of blood glucose level and chronic complications.;Plate Method    Being Active Role of exercise on diabetes management, blood pressure control and cardiac health.;Helped patient identify appropriate exercises in relation to his/her diabetes, diabetes complications and other health issue.    Medications Reviewed patients medication for diabetes, action, purpose, timing of dose and side effects.    Monitoring Identified appropriate SMBG and/or A1C goals.      Individualized Goals (developed by patient)   Nutrition General guidelines for healthy choices and portions discussed    Physical Activity Exercise 3-5 times per week;15 minutes per day    Medications take my medication as prescribed    Monitoring  Test my blood glucose as discussed    Problem Solving Eating Pattern;Addressing barriers to behavior change      Patient Self-Evaluation of Goals - Patient rates self as meeting previously set goals (% of time)   Nutrition 50 - 75 % (half of the time)    Physical Activity  < 25% (hardly ever/never)    Medications >75% (most of the time)    Monitoring >75% (most of the time)    Problem Solving and behavior change strategies  >75% (most of the time)    Reducing Risk (treating acute and chronic complications) >51% (most of the time)    Health Coping >75% (most of the time)      Post-Education Assessment   Patient understands the diabetes disease and treatment process. Demonstrates understanding / competency    Patient understands incorporating nutritional management into lifestyle. Needs Review    Patient undertands incorporating physical activity into lifestyle. Demonstrates understanding / competency    Patient understands using medications safely. Demonstrates understanding / competency    Patient understands monitoring blood glucose, interpreting and using results Demonstrates understanding /  competency    Patient understands prevention, detection, and treatment of acute complications. Demonstrates understanding / competency    Patient understands prevention, detection, and treatment of chronic complications. Demonstrates understanding / competency    Patient understands how to develop strategies to address psychosocial issues. Demonstrates understanding / competency    Patient understands how to develop strategies to promote health/change behavior. Needs Review      Outcomes   Expected Outcomes Demonstrated interest in learning. Expect positive outcomes    Future DMSE 3-4 months    Program Status Not Completed      Subsequent Visit   Since your last visit have you continued or begun to take your medications as prescribed? Yes   forgets occasionally   Since your last visit have you experienced any weight changes? Gain    Weight Gain (lbs) 7   in 2 years   Since your last visit, are you checking your blood glucose at least once a day? Yes             Individualized Plan for Diabetes Self-Management Training:   Learning Objective:  Patient will have  a greater understanding of diabetes self-management. Patient education plan is to attend individual and/or group sessions per assessed needs and concerns.   Plan:   Patient Instructions  You have a great plan:  Drink unsweetened tea rather than water  Avoid the lemonade  Start back with some form of exercising most days of the week (routine)  [employer] silver sneakers You tube videos  Increase your vegetable intake Have cucumbers with your snack and decrease the cheese and crackers for example.  Continue to check your blood glucose as directed. Continue to take your medication as prescribed.    Expected Outcomes:  Demonstrated interest in learning. Expect positive outcomes  Education material provided: My Plate and Diabetes Resources  If problems or questions, patient to contact team via:  Phone  Future DSME appointment: 3-4 months

## 2022-03-12 NOTE — Patient Instructions (Addendum)
You have a great plan:  Drink unsweetened tea rather than water  Avoid the lemonade  Start back with some form of exercising most days of the week (routine)  [employer] silver sneakers You tube videos  Increase your vegetable intake Have cucumbers with your snack and decrease the cheese and crackers for example.  Continue to check your blood glucose as directed. Continue to take your medication as prescribed.

## 2022-03-14 ENCOUNTER — Other Ambulatory Visit: Payer: Self-pay

## 2022-03-14 DIAGNOSIS — E1165 Type 2 diabetes mellitus with hyperglycemia: Secondary | ICD-10-CM

## 2022-03-14 MED ORDER — LEVOTHYROXINE SODIUM 50 MCG PO TABS: 50.0000 ug | ORAL_TABLET | Freq: Every day | ORAL | 3 refills | Status: AC

## 2022-03-23 ENCOUNTER — Other Ambulatory Visit (HOSPITAL_COMMUNITY): Payer: Self-pay

## 2022-03-31 ENCOUNTER — Other Ambulatory Visit: Payer: Self-pay | Admitting: Medical

## 2022-04-10 ENCOUNTER — Encounter (HOSPITAL_COMMUNITY): Payer: Self-pay | Admitting: Emergency Medicine

## 2022-04-10 ENCOUNTER — Emergency Department (HOSPITAL_COMMUNITY): Payer: Medicare Other

## 2022-04-10 ENCOUNTER — Emergency Department (HOSPITAL_COMMUNITY)
Admission: EM | Admit: 2022-04-10 | Discharge: 2022-04-10 | Disposition: A | Discharge status: Home / Self Care | Payer: Medicare Other | Attending: Emergency Medicine | Admitting: Emergency Medicine

## 2022-04-10 ENCOUNTER — Other Ambulatory Visit: Payer: Self-pay

## 2022-04-10 DIAGNOSIS — Z7984 Long term (current) use of oral hypoglycemic drugs: Secondary | ICD-10-CM | POA: Insufficient documentation

## 2022-04-10 DIAGNOSIS — E119 Type 2 diabetes mellitus without complications: Secondary | ICD-10-CM | POA: Insufficient documentation

## 2022-04-10 DIAGNOSIS — Z79899 Other long term (current) drug therapy: Secondary | ICD-10-CM | POA: Insufficient documentation

## 2022-04-10 DIAGNOSIS — R0789 Other chest pain: Secondary | ICD-10-CM | POA: Insufficient documentation

## 2022-04-10 DIAGNOSIS — Z20822 Contact with and (suspected) exposure to covid-19: Secondary | ICD-10-CM | POA: Insufficient documentation

## 2022-04-10 DIAGNOSIS — I10 Essential (primary) hypertension: Secondary | ICD-10-CM | POA: Insufficient documentation

## 2022-04-10 DIAGNOSIS — Z794 Long term (current) use of insulin: Secondary | ICD-10-CM | POA: Insufficient documentation

## 2022-04-10 DIAGNOSIS — Z7982 Long term (current) use of aspirin: Secondary | ICD-10-CM | POA: Diagnosis not present

## 2022-04-10 DIAGNOSIS — R0602 Shortness of breath: Secondary | ICD-10-CM | POA: Insufficient documentation

## 2022-04-10 LAB — CBC WITH DIFFERENTIAL/PLATELET
Abs Immature Granulocytes: 0.02 10*3/uL (ref 0.00–0.07)
Basophils Absolute: 0.1 10*3/uL (ref 0.0–0.1)
Basophils Relative: 1 %
Eosinophils Absolute: 0.2 10*3/uL (ref 0.0–0.5)
Eosinophils Relative: 3 %
HCT: 43.1 % (ref 36.0–46.0)
Hemoglobin: 14.2 g/dL (ref 12.0–15.0)
Immature Granulocytes: 0 %
Lymphocytes Relative: 47 %
Lymphs Abs: 4.3 10*3/uL — ABNORMAL HIGH (ref 0.7–4.0)
MCH: 30.7 pg (ref 26.0–34.0)
MCHC: 32.9 g/dL (ref 30.0–36.0)
MCV: 93.1 fL (ref 80.0–100.0)
Monocytes Absolute: 0.5 10*3/uL (ref 0.1–1.0)
Monocytes Relative: 6 %
Neutro Abs: 3.9 10*3/uL (ref 1.7–7.7)
Neutrophils Relative %: 43 %
Platelets: 219 10*3/uL (ref 150–400)
RBC: 4.63 MIL/uL (ref 3.87–5.11)
RDW: 13.2 % (ref 11.5–15.5)
WBC: 9 10*3/uL (ref 4.0–10.5)
nRBC: 0 % (ref 0.0–0.2)

## 2022-04-10 LAB — COMPREHENSIVE METABOLIC PANEL
ALT: 23 U/L (ref 0–44)
AST: 27 U/L (ref 15–41)
Albumin: 3.6 g/dL (ref 3.5–5.0)
Alkaline Phosphatase: 54 U/L (ref 38–126)
Anion gap: 11 (ref 5–15)
BUN: 17 mg/dL (ref 6–20)
CO2: 27 mmol/L (ref 22–32)
Calcium: 9.4 mg/dL (ref 8.9–10.3)
Chloride: 101 mmol/L (ref 98–111)
Creatinine, Ser: 1.46 mg/dL — ABNORMAL HIGH (ref 0.44–1.00)
GFR, Estimated: 41 mL/min — ABNORMAL LOW (ref >60)
Glucose, Bld: 162 mg/dL — ABNORMAL HIGH (ref 70–99)
Potassium: 3.7 mmol/L (ref 3.5–5.1)
Sodium: 139 mmol/L (ref 135–145)
Total Bilirubin: 0.3 mg/dL (ref 0.3–1.2)
Total Protein: 7.3 g/dL (ref 6.5–8.1)

## 2022-04-10 LAB — RESP PANEL BY RT-PCR (RSV, FLU A&B, COVID)  RVPGX2
Influenza A by PCR: NEGATIVE
Influenza B by PCR: NEGATIVE
Resp Syncytial Virus by PCR: NEGATIVE
SARS Coronavirus 2 by RT PCR: NEGATIVE

## 2022-04-10 LAB — TROPONIN I (HIGH SENSITIVITY)
Troponin I (High Sensitivity): 6 ng/L (ref <18)
Troponin I (High Sensitivity): 6 ng/L (ref <18)

## 2022-04-10 MED ORDER — IOHEXOL 350 MG/ML SOLN
60.0000 mL | Freq: Once | INTRAVENOUS | Status: AC | PRN
  Administered 2022-04-10: 60 mL via INTRAVENOUS

## 2022-04-10 NOTE — ED Provider Triage Note (Signed)
Emergency Medicine Provider Triage Evaluation Note  Amy Rosario , a 62 y.o. female  was evaluated in triage.  Pt complains of chest pain.  Patient reports that chest pain approximate 1 week ago and reports it is centralized in nature without radiation into the extremities.  Patient denies any associated nausea vomiting or diaphoresis.  No prior history of any cardiac abnormalities or heart arrhythmias.  Patient does not take losartan hydrochlorothiazide combo and aspirin.  No prior history of any strokes or clots.  Review of Systems  Positive: As above Negative: As above  Physical Exam  BP (!) 141/73 (BP Location: Right Arm)   Pulse 79   Temp 98.8 F (37.1 C) (Oral)   Resp 18   Ht '5\' 10"'$  (1.778 m)   Wt (!) 145.2 kg   LMP 11/09/2013   SpO2 95%   BMI 45.92 kg/m  Gen:   Awake, no distress   Resp:  Normal effort  MSK:   Moves extremities without difficulty  Other:    Medical Decision Making  Medically screening exam initiated at 4:21 PM.  Appropriate orders placed.  [name] was informed that the remainder of the evaluation will be completed by another provider, this initial triage assessment does not replace that evaluation, and the importance of remaining in the ED until their evaluation is complete.     Luvenia Heller, PA-C 04/10/22 [phone]

## 2022-04-10 NOTE — ED Triage Notes (Signed)
Pt c/o centralized chest pain, described as heaviness for approx 1 week. Endorses fatigue and shortness of breath.

## 2022-04-10 NOTE — ED Provider Notes (Signed)
[facility] Provider Note   CSN: [id number] Arrival date & time: 04/10/22  1553     History {Add pertinent medical, surgical, social history, OB history to HPI:1} Chief Complaint  Patient presents with   Chest Pain    Amy Rosario is a 62 y.o. female.  Patient has a history of hypertension and diabetes.  She states she has been having chest discomfort off-and-on for about a week.  Patient also complains of some shortness of breath   Chest Pain      Home Medications Prior to Admission medications   Medication Sig Start Date End Date Taking? Authorizing Provider  allopurinol (ZYLOPRIM) 300 MG tablet TAKE 1 TABLET BY MOUTH EVERY DAY 03/14/21   Tysinger, Camelia Eng, PA-C  amLODipine (NORVASC) 10 MG tablet TAKE 1 TABLET BY MOUTH EVERY DAY 07/31/21   Tysinger, Camelia Eng, PA-C  aspirin 81 MG EC tablet TAKE 1 TABLET BY MOUTH EVERY DAY 04/06/21   Tysinger, Camelia Eng, PA-C  bisoprolol (ZEBETA) 10 MG tablet TAKE 1 TABLET BY MOUTH EVERY DAY 11/23/21   Tysinger, Camelia Eng, PA-C  Blood Glucose Monitoring Suppl (ONE TOUCH ULTRA 2) w/Device KIT 1 Device by Does not apply route 2 (two) times daily. 10/04/16   Tysinger, Camelia Eng, PA-C  D-1000 EXTRA STRENGTH 25 MCG (1000 UT) tablet TAKE 1 TABLET BY MOUTH EVERY DAY 09/25/21   Tysinger, Camelia Eng, PA-C  dapagliflozin propanediol (FARXIGA) 10 MG TABS tablet Take 1 tablet (10 mg total) by mouth daily before breakfast. 02/22/22   Elayne Snare, MD  Dulaglutide (TRULICITY) 3 0000000 SOPN Inject 3 mg as directed once a week. 01/26/22   Elayne Snare, MD  gabapentin (NEURONTIN) 100 MG capsule TAKE 1 CAPSULE BY MOUTH AT BEDTIME. 03/14/21   Tysinger, Camelia Eng, PA-C  glucose blood (ONETOUCH VERIO) test strip CHECKS SUGARS 4 TIMES DAILY, ON MEAL TIME INSULIN 01/03/22   Elayne Snare, MD  insulin degludec (TRESIBA FLEXTOUCH) 200 UNIT/ML FlexTouch Pen Inject 130 Units into the skin daily. And pen needles 1/day 12/19/21   Elayne Snare, MD   Insulin Pen Needle (PEN NEEDLES) 33G X 4 MM MISC Use as instructed to inject insulin 1/day 06/12/21   Renato Shin, MD  Lancets [facility] DELICA PLUS [id number]) [place] USE TWICE A DAY 12/07/19   Tysinger, Camelia Eng, PA-C  levothyroxine (SYNTHROID) 50 MCG tablet Take 1 tablet (50 mcg total) by mouth daily. 03/14/22   Elayne Snare, MD  meclizine (ANTIVERT) 25 MG tablet TAKE 1 TABLET BY MOUTH TWICE A DAY 03/14/21   Tysinger, Camelia Eng, PA-C  metFORMIN (GLUCOPHAGE) 1000 MG tablet TAKE 1 TABLET (1,000 MG TOTAL) BY MOUTH 2 (TWO) TIMES DAILY WITH A MEAL. 02/11/19   Renato Shin, MD  Multiple Vitamin (MULTIVITAMIN) tablet Take 1 tablet by mouth daily.    [provider]  potassium chloride (KLOR-CON) 20 MEQ packet Take 20 mEq by mouth 2 (two) times daily. Patient taking differently: Take 20 mEq by mouth 2 (two) times daily. 06/08/19   Tysinger, Camelia Eng, PA-C  rosuvastatin (CRESTOR) 20 MG tablet TAKE 1 TABLET BY MOUTH EVERY DAY 06/12/21   Tysinger, Camelia Eng, PA-C  valsartan-hydrochlorothiazide (DIOVAN-HCT) 320-25 MG tablet TAKE 1 TABLET BY MOUTH EVERY DAY 10/21/20   Tysinger, Camelia Eng, PA-C      Allergies    Penicillins    Review of Systems   Review of Systems  Cardiovascular:  Positive for chest pain.    Physical Exam Updated Vital Signs  BP 129/75   Pulse 71   Temp 98 F (36.7 C) (Oral)   Resp 15   Ht '5\' 10"'$  (1.778 m)   Wt (!) 145.2 kg   LMP 11/09/2013   SpO2 97%   BMI 45.92 kg/m  Physical Exam  ED Results / Procedures / Treatments   Labs (all labs ordered are listed, but only abnormal results are displayed) Labs Reviewed  COMPREHENSIVE METABOLIC PANEL - Abnormal; Notable for the following components:      Result Value   Glucose, Bld 162 (*)    Creatinine, Ser 1.46 (*)    GFR, Estimated 41 (*)    All other components within normal limits  CBC WITH DIFFERENTIAL/PLATELET - Abnormal; Notable for the following components:   Lymphs Abs 4.3 (*)    All other components within normal limits   RESP PANEL BY RT-PCR (RSV, FLU A&B, COVID)  [id number]  TROPONIN I (HIGH SENSITIVITY)  TROPONIN I (HIGH SENSITIVITY)    EKG EKG Interpretation  Date/Time:  Tuesday April 10 2022 15:42:31 EST Ventricular Rate:  78 PR Interval:  172 QRS Duration: 92 QT Interval:  404 QTC Calculation: 460 R Axis:   34 Text Interpretation: Normal sinus rhythm Cannot rule out Anterior infarct , age undetermined Abnormal ECG When compared with ECG of 16-Jun-2019 06:09, PREVIOUS ECG IS PRESENT Confirmed by Milton Ferguson [phone]) on 04/10/2022 7:54:01 PM  Radiology CT Angio Chest PE W and/or Wo Contrast  Result Date: 04/10/2022 CLINICAL DATA:  Pulmonary embolism (PE) suspected, high prob. Chest pain EXAM: CT ANGIOGRAPHY CHEST WITH CONTRAST TECHNIQUE: Multidetector CT imaging of the chest was performed using the standard protocol during bolus administration of intravenous contrast. Multiplanar CT image reconstructions and MIPs were obtained to evaluate the vascular anatomy. RADIATION DOSE REDUCTION: This exam was performed according to the departmental dose-optimization program which includes automated exposure control, adjustment of the mA and/or kV according to patient size and/or use of iterative reconstruction technique. CONTRAST:  64m OMNIPAQUE IOHEXOL 350 MG/ML SOLN COMPARISON:  03/30/2016 FINDINGS: Cardiovascular: No filling defects in the pulmonary arteries to suggest pulmonary emboli. Heart is normal size. Aorta is normal caliber. Scattered coronary artery and aortic calcifications. Mediastinum/Nodes: No mediastinal, hilar, or axillary adenopathy. Trachea and esophagus are unremarkable. Thyromegaly with scattered calcifications. This has been evaluated on previous imaging. (ref: J Am Coll Radiol. 2015 Feb;12(2): 143-50). Lungs/Pleura: Lungs are clear. No focal airspace opacities or suspicious nodules. No effusions. Upper Abdomen: No acute findings Musculoskeletal: Chest wall soft tissues are unremarkable. No acute  bony abnormality. Review of the MIP images confirms the above findings. IMPRESSION: No evidence of pulmonary embolus. No acute cardiopulmonary disease. Aortic Atherosclerosis (ICD10-I70.0). Electronically Signed   By: KRolm BaptiseM.D.   On: 04/10/2022 22:37   DG Chest 2 View  Result Date: 04/10/2022 CLINICAL DATA:  Chest pain.  Chest heaviness for 1 week. EXAM: CHEST - 2 VIEW COMPARISON:  Chest radiographs 04/02/2018 FINDINGS: Cardiac silhouette and mediastinal contours are within normal limits. The lungs are clear. No pleural effusion or pneumothorax. Mild dextrocurvature of the midthoracic spine and mild levocurvature of the thoracolumbar junction. Moderate multilevel degenerative bridging osteophytes. IMPRESSION: No active cardiopulmonary disease. Electronically Signed   By: RYvonne KendallM.D.   On: 04/10/2022 16:58    Procedures Procedures  {Document cardiac monitor, telemetry assessment procedure when appropriate:1}  Medications Ordered in ED Medications  iohexol (OMNIPAQUE) 350 MG/ML injection 60 mL (60 mLs Intravenous Contrast Given 04/10/22 2220)    ED Course/ Medical Decision Making/  A&P   {   Click here for ABCD2, HEART and other calculatorsREFRESH Note before signing :1}                          Medical Decision Making Amount and/or Complexity of Data Reviewed Radiology: ordered.  Risk Prescription drug management.   Patient with atypical chest pain.  2 troponins are negative.  EKG unremarkable.  CT angio negative.  Patient will follow-up with PCP  {Document critical care time when appropriate:1} {Document review of labs and clinical decision tools ie heart score, Chads2Vasc2 etc:1}  {Document your independent review of radiology images, and any outside records:1} {Document your discussion with family members, caretakers, and with consultants:1} {Document social determinants of health affecting pt's care:1} {Document your decision making why or why not admission,  treatments were needed:1} Final Clinical Impression(s) / ED Diagnoses Final diagnoses:  Atypical chest pain    Rx / DC Orders ED Discharge Orders     None

## 2022-04-10 NOTE — Discharge Instructions (Signed)
Follow-up with your doctor next week for recheck.  Return if any problems

## 2022-04-16 ENCOUNTER — Other Ambulatory Visit (INDEPENDENT_AMBULATORY_CARE_PROVIDER_SITE_OTHER): Payer: Commercial Managed Care - HMO

## 2022-04-16 DIAGNOSIS — E1165 Type 2 diabetes mellitus with hyperglycemia: Secondary | ICD-10-CM

## 2022-04-16 DIAGNOSIS — Z794 Long term (current) use of insulin: Secondary | ICD-10-CM

## 2022-04-16 LAB — BASIC METABOLIC PANEL
BUN: 21 mg/dL (ref 6–23)
CO2: 29 mEq/L (ref 19–32)
Calcium: 10.1 mg/dL (ref 8.4–10.5)
Chloride: 100 mEq/L (ref 96–112)
Creatinine, Ser: 1.36 mg/dL — ABNORMAL HIGH (ref 0.40–1.20)
GFR: 42.65 mL/min — ABNORMAL LOW (ref >60.00)
Glucose, Bld: 151 mg/dL — ABNORMAL HIGH (ref 70–99)
Potassium: 3.7 mEq/L (ref 3.5–5.1)
Sodium: 140 mEq/L (ref 135–145)

## 2022-04-16 LAB — HEMOGLOBIN A1C: Hgb A1c MFr Bld: 8.1 % — ABNORMAL HIGH (ref 4.6–6.5)

## 2022-04-20 ENCOUNTER — Ambulatory Visit (INDEPENDENT_AMBULATORY_CARE_PROVIDER_SITE_OTHER): Payer: Commercial Managed Care - HMO | Admitting: Endocrinology

## 2022-04-20 ENCOUNTER — Encounter: Payer: Self-pay | Admitting: Endocrinology

## 2022-04-20 VITALS — BP 152/90 | HR 94 | Ht 70.0 in | Wt 329.6 lb

## 2022-04-20 DIAGNOSIS — Z794 Long term (current) use of insulin: Secondary | ICD-10-CM | POA: Diagnosis not present

## 2022-04-20 DIAGNOSIS — I10 Essential (primary) hypertension: Secondary | ICD-10-CM | POA: Diagnosis not present

## 2022-04-20 DIAGNOSIS — E1165 Type 2 diabetes mellitus with hyperglycemia: Secondary | ICD-10-CM | POA: Diagnosis not present

## 2022-04-20 MED ORDER — OZEMPIC (2 MG/DOSE) 8 MG/3ML ~~LOC~~ SOPN: PEN_INJECTOR | SUBCUTANEOUS | 1 refills | Status: DC

## 2022-04-20 MED ORDER — NOVOLOG FLEXPEN 100 UNIT/ML ~~LOC~~ SOPN: PEN_INJECTOR | SUBCUTANEOUS | 1 refills | Status: DC

## 2022-04-20 NOTE — Patient Instructions (Addendum)
  Switch Trulicity to Ozempic 2 mg weekly If Ozempic is not covered may switch to Trulicity 4.5 mg weekly or possibly [employer] mealtime insulin to cover mealtime glucose spikes Start with 15 units before bedtime taking right before eating We need to try and get the two-hour reading of under 180 after dinner and may continue to increase the dose by 2 to 3 units if needed  Tresiba 120 when on Novolog  Check blood sugars on waking up 4 days a week  Also check blood sugars about 2 hours after meals and do this after different meals by rotation  Recommended blood sugar levels on waking up are 90-130 and about 2 hours after meal is 130-160  Please bring your blood sugar monitor to each visit, thank you

## 2022-04-20 NOTE — Progress Notes (Unsigned)
Patient ID: Amy Rosario, female   DOB: November 13, 1960, 62 y.o.   MRN: [id number]           Reason for Appointment: Type II Diabetes follow-up   History of Present Illness   Diagnosis date: 2015  Previous history:  She thinks she was started on insulin a couple of months after her diagnosis but details are not available She was continued on metformin also Over the years she has had significant difficulty with losing weight A1c as high as 13 back in 2018 Recent A1c range 6.4-8.9  Non-insulin hypoglycemic drugs: Trulicity 3 mg weekly, Farxiga 10 mg daily     Insulin regimen: Tresiba 130 units every morning        Current A1c 8.1, was 8.7  Current self management, blood sugar patterns and problems identified:  She is taking Trulicity now since she was previously not able to get Ozempic  However her blood sugar readings are still not controlled She did not bring a monitor for download today Although she thinks her fasting readings are near normal her blood sugars after dinner are reportedly around 240 She does not check after any other meals She is still having difficulty losing weight and BMI is still 47 Lately has not been doing as much exercise also Lab fasting glucose was 151 Still taking relatively large doses of basal insulin only Has seen the dietitian as recommended recently She has been reluctant to use CGM as before Side effects from medications: None  Exercise: walks 1/7 days, up to 10-15 minutes slowly  Diet management: Usually eating small portions      Dinner 5-6 pm Bfst 8-9  Monitors blood glucose: 1-2 times a day   glucometer: One Touch.           Blood Glucose readings from recall:   PRE-MEAL Fasting Lunch Dinner Bedtime Overall  Glucose range: 108-120      Mean/median:        POST-MEAL PC Breakfast PC Lunch PC Dinner  Glucose range:   240  Mean/median:      Previously:  PRE-MEAL Fasting Lunch Dinner Bedtime Overall  Glucose range: 114-195     109-195  Mean/median: 146    135   POST-MEAL PC Breakfast PC Lunch PC Dinner  Glucose range:   ?  Mean/median:  129               Dietician visit: Most recent:   2/24  Weight control:  Wt Readings from Last 3 Encounters:  04/20/22 (!) 329 lb 9.6 oz (149.5 kg)  04/10/22 (!) 320 lb (145.2 kg)  03/12/22 (!) 325 lb (147.4 kg)           Diabetes labs:  Lab Results  Component Value Date   HGBA1C 8.1 (H) 04/16/2022   HGBA1C 8.7 (H) 01/17/2022   HGBA1C 6.8 (A) 09/18/2021   Lab Results  Component Value Date   MICROALBUR 5.1 (H) 06/19/2021   LDLCALC 80 10/17/2020   CREATININE 1.36 (H) 04/16/2022     Allergies as of 04/20/2022       Reactions   Penicillins Hives, Swelling   Has patient had a PCN reaction causing immediate rash, facial/tongue/throat swelling, SOB or lightheadedness with hypotension: Yes Has patient had a PCN reaction causing severe rash involving mucus membranes or skin necrosis: No Has patient had a PCN reaction that required hospitalization: No Has patient had a PCN reaction occurring within the last 10 years: No If all of the above answers  are "NO", then may proceed with Cephalosporin use.        Medication List        Accurate as of April 20, 2022 11:59 PM. If you have any questions, ask your nurse or doctor.          STOP taking these medications    Trulicity 3 0000000 Sopn Generic drug: Dulaglutide Stopped by: Elayne Snare, MD       TAKE these medications    allopurinol 300 MG tablet Commonly known as: ZYLOPRIM TAKE 1 TABLET BY MOUTH EVERY DAY   amLODipine 10 MG tablet Commonly known as: NORVASC TAKE 1 TABLET BY MOUTH EVERY DAY   aspirin EC 81 MG tablet TAKE 1 TABLET BY MOUTH EVERY DAY   bisoprolol 10 MG tablet Commonly known as: ZEBETA TAKE 1 TABLET BY MOUTH EVERY DAY   D-1000 Extra Strength 25 MCG (1000 UT) tablet Generic drug: Cholecalciferol TAKE 1 TABLET BY MOUTH EVERY DAY   dapagliflozin propanediol 10 MG Tabs  tablet Commonly known as: Farxiga Take 1 tablet (10 mg total) by mouth daily before breakfast.   gabapentin 100 MG capsule Commonly known as: NEURONTIN TAKE 1 CAPSULE BY MOUTH AT BEDTIME.   levothyroxine 50 MCG tablet Commonly known as: SYNTHROID Take 1 tablet (50 mcg total) by mouth daily.   meclizine 25 MG tablet Commonly known as: ANTIVERT TAKE 1 TABLET BY MOUTH TWICE A DAY   metFORMIN 1000 MG tablet Commonly known as: GLUCOPHAGE TAKE 1 TABLET (1,000 MG TOTAL) BY MOUTH 2 (TWO) TIMES DAILY WITH A MEAL.   multivitamin tablet Take 1 tablet by mouth daily.   NovoLOG FlexPen 100 UNIT/ML FlexPen Generic drug: insulin aspart 15 Units ac tid Started by: Elayne Snare, MD   ONE TOUCH ULTRA 2 w/Device Kit 1 Device by Does not apply route 2 (two) times daily.   OneTouch Delica Plus 0000000 Misc USE TWICE A DAY   OneTouch Verio test strip Generic drug: glucose blood CHECKS SUGARS 4 TIMES DAILY, ON MEAL TIME INSULIN   Ozempic (2 MG/DOSE) 8 MG/3ML Sopn Generic drug: Semaglutide (2 MG/DOSE) Inject weekly Started by: Elayne Snare, MD   Pen Needles 33G X 4 MM Misc Use as instructed to inject insulin 1/day   potassium chloride 20 MEQ packet Commonly known as: [place]-Con Take 20 mEq by mouth 2 (two) times daily.   rosuvastatin 20 MG tablet Commonly known as: CRESTOR TAKE 1 TABLET BY MOUTH EVERY DAY   Tresiba FlexTouch 200 UNIT/ML FlexTouch Pen Generic drug: insulin degludec Inject 130 Units into the skin daily. And pen needles 1/day   valsartan-hydrochlorothiazide 320-25 MG tablet Commonly known as: DIOVAN-HCT TAKE 1 TABLET BY MOUTH EVERY DAY        Allergies:  Allergies  Allergen Reactions   Penicillins Hives and Swelling    Has patient had a PCN reaction causing immediate rash, facial/tongue/throat swelling, SOB or lightheadedness with hypotension: Yes Has patient had a PCN reaction causing severe rash involving mucus membranes or skin necrosis: No Has patient had  a PCN reaction that required hospitalization: No Has patient had a PCN reaction occurring within the last 10 years: No If all of the above answers are "NO", then may proceed with Cephalosporin use.    Past Medical History:  Diagnosis Date   Chronic gout    followed by pcp---  multiple sites (06-11-2019 per pt last episode fall 2020)   H/O echocardiogram 04-04-2018  received via fax on 06-11-2019 to be scanned in   Dr.  Charolette Forward, [facility]-   Hyperlipidemia    Hypertension 2000   managed by Dr. Terrence Dupont, cardiology   Hypokalemia    Hypothyroidism    endocrinologist-- dr Loanne Drilling-- s/p bx's 03-18-2018 and 07-02-2013 results in epic   OSA on CPAP    pt states has older machine is very large   Peripheral neuropathy    PMB (postmenopausal bleeding)    Sarcoidosis of lung [facility])    pulmonology-- dr wert---  s/p brochoscopy 07-05-2016, no sarcoid  (dr wert released pt as needed per lov note in epic 06-02-2018)   Thyroid goiter    Type 2 diabetes mellitus treated with insulin [facility])    endocrinology--- dr Loanne Drilling   (06-11-2019  per pt checks blood surgar daily in am,  fasting surgar-- 85 -- 130)   Wears glasses     Past Surgical History:  Procedure Laterality Date   COLONOSCOPY WITH PROPOFOL N/A 09/21/2013   Procedure: COLONOSCOPY WITH PROPOFOL;  Surgeon: Juanita Craver, MD;  Location: [facility];  Service: Endoscopy;  Laterality: N/A;   CYST EXCISION  teen   right lower back, benign   DILATATION & CURETTAGE/HYSTEROSCOPY WITH MYOSURE N/A 06/16/2019   Procedure: DILATATION & CURETTAGE/HYSTEROSCOPY WITH MYOSURE/POLYPECTOMY;  Surgeon: Salvadore Dom, MD;  Location: [facility];  Service: Gynecology;  Laterality: N/A;  polypectomy   ENDOMETRIAL BIOPSY  2021   Dr. Sumner Boast   TUBAL LIGATION Bilateral yrs ago   VIDEO BRONCHOSCOPY Bilateral 07/05/2016   Procedure: [place];  Surgeon: Tanda Rockers, MD;  Location: [facility]  ENDOSCOPY;  Service: Endoscopy;  Laterality: Bilateral;    Family History  Problem Relation Age of Onset   Heart disease Father    Hypertension Father    Heart disease Brother    Cancer Neg Hx    Stroke Neg Hx    Diabetes Neg Hx     Social History:  reports that she has never smoked. She has never used smokeless tobacco. She reports that she does not drink alcohol and does not use drugs.  Review of Systems:  Hypertension: Taking Diovan HCT, bisoprolol and amlodipine 10 mg With PCP BP 120/82  BP Readings from Last 3 Encounters:  04/20/22 (!) 152/90  04/10/22 129/75  01/19/22 (!) 142/82   Urine microalbumin normal as of 5/23  Has had mild abnormality of renal function  Lab Results  Component Value Date   CREATININE 1.36 (H) 04/16/2022   CREATININE 1.46 (H) 04/10/2022   CREATININE 1.37 (H) 01/17/2022    Lipids: Controlled with Crestor 20 mg prescribed by PCP, no recent labs available    Lab Results  Component Value Date   CHOL 145 10/17/2020   CHOL 292 (H) 02/24/2018   CHOL 156 05/20/2017   Lab Results  Component Value Date   HDL 47 10/17/2020   HDL 47 02/24/2018   HDL 46 05/20/2017   Lab Results  Component Value Date   LDLCALC 80 10/17/2020   LDLCALC 184 (H) 02/24/2018   LDLCALC 79 05/20/2017   Lab Results  Component Value Date   TRIG 99 10/17/2020   TRIG 306 (H) 02/24/2018   TRIG 155 (H) 05/20/2017   Lab Results  Component Value Date   CHOLHDL 3.1 10/17/2020   CHOLHDL 6.2 (H) 02/24/2018   CHOLHDL 3.4 05/20/2017   No results found for: "LDLDIRECT"  Taking 50 mcg of levothyroxine, baseline TSH 5.2  Lab Results  Component Value Date   TSH 2.10 06/19/2021   TSH  2.040 10/17/2020   TSH 4.61 (H) 11/09/2019   FREET4 0.89 06/19/2021   FREET4 1.32 10/17/2020   FREET4 0.72 11/09/2019    Eye exam last 1/20   Neuropathy: Has numbness, pins-and-needles and some pains on walking relieved by gabapentin somewhat which is taken at night  Last foot  exam 10/22   Examination:   BP (!) 152/90 (BP Location: Left Arm, Patient Position: Sitting, Cuff Size: Normal)   Pulse 94   Ht 5\' 10"  (1.778 m)   Wt (!) 329 lb 9.6 oz (149.5 kg)   LMP 11/09/2013   SpO2 95%   BMI 47.29 kg/m   Body mass index is 47.29 kg/m.    ASSESSMENT/ PLAN:    Diabetes type 2 on insulin with severe obesity:   Current regimen Tresiba, Trulicity, metformin and Farxiga   A1c 8.1 and not improved much  She appears to have high postprandial readings as reported from her home readings after dinner This is likely contributing to her high A1c with no fasting readings are relatively better especially at home Also still having difficulty losing weight and may do better with going back to maximum dose of Ozempic or possibly Mounjaro  Recommendations:  Switch Trulicity back to Ozempic 2 mg weekly If Ozempic is not covered may switch to Trulicity 4.5 mg weekly or possibly [employer] mealtime insulin with NovoLog to cover mealtime glucose spikes Start with 15 units NovoLog, taking right before eating supper We need to try and get the two-hour reading of under 180 after dinner and may continue to increase the dose by 2 to 3 units if needed Also consider adding it to other meals if postprandial readings are still high after breakfast and lunch  To bring monitor on each visit for download Again reconsider use of CGM on the next visit  Tresiba 120 when starting on Novolog She does need to increase her exercise and can walk up to twice a day if needed  Hypertension: Blood pressure is somewhat higher today, follow-up with PCP  Renal function: Slightly worse and will need to continue monitoring especially with adjusting [place]  Patient Instructions   Switch Trulicity to Ozempic 2 mg weekly If Ozempic is not covered may switch to Trulicity 4.5 mg weekly or possibly [employer] mealtime insulin to cover mealtime glucose spikes Start with 15 units before  bedtime taking right before eating We need to try and get the two-hour reading of under 180 after dinner and may continue to increase the dose by 2 to 3 units if needed  Tresiba 120 when on Novolog  Check blood sugars on waking up 4 days a week  Also check blood sugars about 2 hours after meals and do this after different meals by rotation  Recommended blood sugar levels on waking up are 90-130 and about 2 hours after meal is 130-160  Please bring your blood sugar monitor to each visit, thank you   Total visit time for evaluation and management and counseling = 30 minutes  Elayne Snare 04/22/2022, 10:48 AM

## 2022-04-25 ENCOUNTER — Telehealth: Payer: Self-pay

## 2022-04-25 NOTE — Telephone Encounter (Signed)
Faxed received from [facility]. Ozempic is not covered.   Alternatives  Metformin HCL ER Tabs  Byetta injection 53mcg    Trulicity pen [id number]   Bydureon BCISE autoinjector 0.1ml  Byetta Injection 82mcg  Please advise on change

## 2022-04-26 NOTE — Telephone Encounter (Signed)
Patient advised of medication change.

## 2022-05-01 ENCOUNTER — Other Ambulatory Visit (HOSPITAL_COMMUNITY): Payer: Self-pay

## 2022-05-14 ENCOUNTER — Ambulatory Visit: Payer: Medicare Other | Admitting: Obstetrics and Gynecology

## 2022-05-14 ENCOUNTER — Ambulatory Visit: Payer: BC Managed Care – PPO | Admitting: Dietician

## 2022-05-31 ENCOUNTER — Other Ambulatory Visit (INDEPENDENT_AMBULATORY_CARE_PROVIDER_SITE_OTHER): Payer: Commercial Managed Care - HMO

## 2022-05-31 ENCOUNTER — Telehealth: Payer: Self-pay

## 2022-05-31 DIAGNOSIS — E039 Hypothyroidism, unspecified: Secondary | ICD-10-CM

## 2022-05-31 DIAGNOSIS — E1165 Type 2 diabetes mellitus with hyperglycemia: Secondary | ICD-10-CM

## 2022-05-31 LAB — T4, FREE: Free T4: 0.82 ng/dL (ref 0.60–1.60)

## 2022-05-31 LAB — TSH: TSH: 2.59 u[IU]/mL (ref 0.35–5.50)

## 2022-05-31 NOTE — Progress Notes (Unsigned)
A user error has taken place: {error:[id number]}.

## 2022-05-31 NOTE — Telephone Encounter (Signed)
Labs entered. Amy Rosario

## 2022-06-03 LAB — LIPID PANEL
Cholesterol: 134 mg/dL (ref <200)
HDL: 40 mg/dL — ABNORMAL LOW (ref >=50)
LDL Cholesterol (Calc): 70 mg/dL (calc)
Non-HDL Cholesterol (Calc): 94 mg/dL (calc) (ref <130)
Total CHOL/HDL Ratio: 3.4 (calc) (ref <5.0)
Triglycerides: 153 mg/dL — ABNORMAL HIGH (ref <150)

## 2022-06-03 LAB — GLUTAMIC ACID DECARBOXYLASE AUTO ABS: Glutamic Acid Decarb Ab: <5 IU/mL (ref <5)

## 2022-06-04 ENCOUNTER — Ambulatory Visit (INDEPENDENT_AMBULATORY_CARE_PROVIDER_SITE_OTHER): Payer: Commercial Managed Care - HMO | Admitting: "Endocrinology

## 2022-06-04 ENCOUNTER — Encounter: Payer: Self-pay | Admitting: "Endocrinology

## 2022-06-04 VITALS — BP 158/92 | HR 98 | Ht 70.0 in | Wt 325.2 lb

## 2022-06-04 DIAGNOSIS — Z7984 Long term (current) use of oral hypoglycemic drugs: Secondary | ICD-10-CM

## 2022-06-04 DIAGNOSIS — G629 Polyneuropathy, unspecified: Secondary | ICD-10-CM

## 2022-06-04 DIAGNOSIS — E782 Mixed hyperlipidemia: Secondary | ICD-10-CM

## 2022-06-04 DIAGNOSIS — Z794 Long term (current) use of insulin: Secondary | ICD-10-CM | POA: Diagnosis not present

## 2022-06-04 DIAGNOSIS — E1165 Type 2 diabetes mellitus with hyperglycemia: Secondary | ICD-10-CM | POA: Diagnosis not present

## 2022-06-04 DIAGNOSIS — Z7985 Long-term (current) use of injectable non-insulin antidiabetic drugs: Secondary | ICD-10-CM

## 2022-06-04 DIAGNOSIS — I152 Hypertension secondary to endocrine disorders: Secondary | ICD-10-CM

## 2022-06-04 DIAGNOSIS — E1159 Type 2 diabetes mellitus with other circulatory complications: Secondary | ICD-10-CM

## 2022-06-04 LAB — COMPREHENSIVE METABOLIC PANEL
ALT: 20 U/L (ref 0–35)
AST: 20 U/L (ref 0–37)
Albumin: 4.3 g/dL (ref 3.5–5.2)
Alkaline Phosphatase: 56 U/L (ref 39–117)
BUN: 19 mg/dL (ref 6–23)
CO2: 28 mEq/L (ref 19–32)
Calcium: 9.9 mg/dL (ref 8.4–10.5)
Chloride: 102 mEq/L (ref 96–112)
Creatinine, Ser: 1.21 mg/dL — ABNORMAL HIGH (ref 0.40–1.20)
GFR: 49.02 mL/min — ABNORMAL LOW (ref >60.00)
Glucose, Bld: 162 mg/dL — ABNORMAL HIGH (ref 70–99)
Potassium: 3.9 mEq/L (ref 3.5–5.1)
Sodium: 140 mEq/L (ref 135–145)
Total Bilirubin: 0.3 mg/dL (ref 0.2–1.2)
Total Protein: 7.9 g/dL (ref 6.0–8.3)

## 2022-06-04 LAB — MICROALBUMIN / CREATININE URINE RATIO
Creatinine,U: 116.1 mg/dL
Microalb Creat Ratio: 1.1 mg/g (ref 0.0–30.0)
Microalb, Ur: 1.3 mg/dL (ref 0.0–1.9)

## 2022-06-04 MED ORDER — GLUCAGON EMERGENCY 1 MG/ML IJ SOLR: 1.0000 mg | INTRAMUSCULAR | 1 refills | Status: AC | PRN

## 2022-06-04 MED ORDER — TRULICITY 4.5 MG/0.5ML ~~LOC~~ SOAJ: 4.5000 mg | SUBCUTANEOUS | 0 refills | Status: DC

## 2022-06-04 NOTE — Progress Notes (Addendum)
Outpatient Endocrinology Note  Amy Rosario 04-29-1960 [id number]   Referring Provider: Harvest Forest, MD Primary Care Provider: Harvest Forest, MD Reason for consultation: Subjective  Type 2 diabetes mellitus  Assessment & Plan  Amy Rosario was seen today for follow-up.  Diagnoses and all orders for this visit:  Uncontrolled type 2 diabetes mellitus with hyperglycemia (HCC) -     Glutamic acid decarboxylase auto abs -     Comprehensive metabolic panel -     Microalbumin / creatinine urine ratio  Long-term insulin use (HCC)  Neuropathy  Mixed hypercholesterolemia and hypertriglyceridemia  Hypertension associated with diabetes (HCC)  Other orders -     Dulaglutide (TRULICITY) 4.5 MG/0.5ML SOPN; Inject 4.5 mg as directed once a week. -     Glucagon HCl (GLUCAGON EMERGENCY) 1 MG/ML SOLR; Inject 1 mg as directed as needed (low blood sugar with impaired consciouness).    Diabetes complicated by neuropathy, nephropathy with low GFR Hba1c goal less than 8.1, current Hba1c is 7 . Will recommend for the following change of medications to: Tresiba 130 units qam Trulicity 4.5 mg weekly Metformin 1000 mg qd Farxiga 10mg  qd  No known contraindications to any of above medications Glucagon ordered 06/04/22  Hyperlipidemia -Last LDL at goal: 70 -on rosuvastatin 20 mg QD -Follow low fat diet and exercise   -Blood pressure - Microalbumin/creatinine goal < 30 -Not on ACE/ARB -BP off goal: missed BP meds today, take meds, check BP at home and contact PCP -diet changes including salt restriction -limit eating outside -counseled BP targets per standards of diabetes care -Uncontrolled blood pressure can lead to retinopathy, nephropathy and cardiovascular and atherosclerotic heart disease  Reviewed and counseled on: -A1C target -Blood sugar targets -Complications of uncontrolled diabetes  -Checking blood sugar before meals and bedtime and bring log next  visit -All medications with mechanism of action and side effects -Hypoglycemia management: rule of 15's, Glucagon Emergency Kit and medical alert ID -low-carb low-fat plate-method diet -At least 20 minutes of physical activity per day -Annual dilated retinal eye exam and foot exam -compliance and follow up needs -follow up as scheduled or earlier if problem gets worse  Call if blood sugar is less than 70 or consistently above 250    Take a 15 gm snack of carbohydrate at bedtime before you go to sleep if your blood sugar is less than 100.    If you are going to fast after midnight for a test or procedure, ask your physician for instructions on how to reduce/decrease your insulin dose.    Call if blood sugar is less than 70 or consistently above 250  -Treating a low sugar by rule of 15  (15 gms of sugar every 15 min until sugar is more than 70) If you feel your sugar is low, test your sugar to be sure If your sugar is low (less than 70), then take 15 grams of a fast acting Carbohydrate (3-4 glucose tablets or glucose gel or 4 ounces of juice or regular soda) Recheck your sugar 15 min after treating low to make sure it is more than 70 If sugar is still less than 70, treat again with 15 grams of carbohydrate          Don't drive the hour of hypoglycemia  If unconscious/unable to eat or drink by mouth, use glucagon injection or nasal spray baqsimi and call 911. Can repeat again in 15 min if still unconscious.  Return in about 3  months (around 09/03/2022).   I spent more than 50% of today's visit counseling patient on symptoms, examination findings, lab findings, imaging results, treatment decisions and monitoring and prognosis. The patient understood the recommendations and agrees with the treatment plan. All questions regarding treatment plan were fully answered  Amy Gratz, MD    History of Present Illness Amy Rosario is a 62 y.o. year old female who presents for a new referral  for Type 2 diabetes mellitus.  Amy Rosario was first diagnosed in 2015.   Diabetes education +2  Home diabetes regimen: Tresiba 130 units qam Trulicity 3 mg weekly Metformin 1000 mg bid Farxiga 10mg  qd  Not taking Novolog   COMPLICATIONS -  MI/Stroke -  retinopathy, last eye exam 2022 +  neuropathy, last foot exam 2022-on gabapentin 100 mg tid +  nephropathy   BLOOD SUGAR DATA   Physical Exam  BP (!) 158/92 (BP Location: Left Arm, Cuff Size: Large)   Pulse 98   Ht 5\' 10"  (1.778 m)   Wt (!) 325 lb 3.2 oz (147.5 kg)   LMP 11/09/2013   SpO2 95%   BMI 46.66 kg/m    Constitutional: well developed, well nourished Head: normocephalic, atraumatic Eyes: sclera anicteric, no redness Neck: supple Lungs: normal respiratory effort Neurology: alert and oriented Skin: dry, no appreciable rashes Musculoskeletal: no appreciable defects Psychiatric: normal mood and affect Diabetic Foot Exam - Simple   Simple Foot Form Diabetic Foot exam was performed with the following findings: Yes 06/04/2022  9:23 AM  Visual Inspection No deformities, no ulcerations, no other skin breakdown bilaterally: Yes Sensation Testing Intact to touch and monofilament testing bilaterally: Yes Pulse Check Posterior Tibialis and Dorsalis pulse intact bilaterally: Yes Comments     Current Medications Patient's Medications  New Prescriptions   DULAGLUTIDE (TRULICITY) 4.5 MG/0.5ML SOPN    Inject 4.5 mg as directed once a week.   GLUCAGON HCL (GLUCAGON EMERGENCY) 1 MG/ML SOLR    Inject 1 mg as directed as needed (low blood sugar with impaired consciouness).  Previous Medications   ALLOPURINOL (ZYLOPRIM) 300 MG TABLET    TAKE 1 TABLET BY MOUTH EVERY DAY   AMLODIPINE (NORVASC) 10 MG TABLET    TAKE 1 TABLET BY MOUTH EVERY DAY   ASPIRIN 81 MG EC TABLET    TAKE 1 TABLET BY MOUTH EVERY DAY   BISOPROLOL (ZEBETA) 10 MG TABLET    TAKE 1 TABLET BY MOUTH EVERY DAY   BLOOD GLUCOSE MONITORING SUPPL (ONE TOUCH  ULTRA 2) W/DEVICE KIT    1 Device by Does not apply route 2 (two) times daily.   CLONIDINE (CATAPRES) 0.1 MG TABLET    Take 0.1 mg by mouth 2 (two) times daily.   D-1000 EXTRA STRENGTH 25 MCG (1000 UT) TABLET    TAKE 1 TABLET BY MOUTH EVERY DAY   DAPAGLIFLOZIN PROPANEDIOL (FARXIGA) 10 MG TABS TABLET    Take 1 tablet (10 mg total) by mouth daily before breakfast.   GABAPENTIN (NEURONTIN) 100 MG CAPSULE    TAKE 1 CAPSULE BY MOUTH AT BEDTIME.   GLUCOSE BLOOD (ONETOUCH VERIO) TEST STRIP    CHECKS SUGARS 4 TIMES DAILY, ON MEAL TIME INSULIN   INSULIN ASPART (NOVOLOG FLEXPEN) 100 UNIT/ML FLEXPEN    15 Units ac tid   INSULIN DEGLUDEC (TRESIBA FLEXTOUCH) 200 UNIT/ML FLEXTOUCH PEN    Inject 130 Units into the skin daily. And pen needles 1/day   INSULIN PEN NEEDLE (PEN NEEDLES) 33G X 4 MM MISC  Use as instructed to inject insulin 1/day   LANCETS (ONETOUCH DELICA PLUS LANCET33G) MISC    USE TWICE A DAY   LEVOTHYROXINE (SYNTHROID) 50 MCG TABLET    Take 1 tablet (50 mcg total) by mouth daily.   MECLIZINE (ANTIVERT) 25 MG TABLET    TAKE 1 TABLET BY MOUTH TWICE A DAY   METFORMIN (GLUCOPHAGE) 1000 MG TABLET    TAKE 1 TABLET (1,000 MG TOTAL) BY MOUTH 2 (TWO) TIMES DAILY WITH A MEAL.   MULTIPLE VITAMIN (MULTIVITAMIN) TABLET    Take 1 tablet by mouth daily.   POTASSIUM CHLORIDE (KLOR-CON) 20 MEQ PACKET    Take 20 mEq by mouth 2 (two) times daily.   ROSUVASTATIN (CRESTOR) 20 MG TABLET    TAKE 1 TABLET BY MOUTH EVERY DAY   SPIRONOLACTONE (ALDACTONE) 25 MG TABLET    Take 25 mg by mouth daily.   VALSARTAN-HYDROCHLOROTHIAZIDE (DIOVAN-HCT) 320-25 MG TABLET    TAKE 1 TABLET BY MOUTH EVERY DAY  Modified Medications   No medications on file  Discontinued Medications   SEMAGLUTIDE, 2 MG/DOSE, (OZEMPIC, 2 MG/DOSE,) 8 MG/3ML SOPN    Inject weekly   TRULICITY 3 MG/0.5ML SOPN    Inject 3 mg into the skin daily.    Allergies Allergies  Allergen Reactions   Penicillins Hives and Swelling    Has patient had a PCN reaction  causing immediate rash, facial/tongue/throat swelling, SOB or lightheadedness with hypotension: Yes Has patient had a PCN reaction causing severe rash involving mucus membranes or skin necrosis: No Has patient had a PCN reaction that required hospitalization: No Has patient had a PCN reaction occurring within the last 10 years: No If all of the above answers are "NO", then may proceed with Cephalosporin use.    Past Medical History Past Medical History:  Diagnosis Date   Chronic gout    followed by pcp---  multiple sites (06-11-2019 per pt last episode fall 2020)   H/O echocardiogram 04-04-2018  received via fax on 06-11-2019 to be scanned in   Dr. Rinaldo Cloud, [facility]-   Hyperlipidemia    Hypertension 2000   managed by Dr. Sharyn Lull, cardiology   Hypokalemia    Hypothyroidism    endocrinologist-- dr Everardo All-- s/p bx's 03-18-2018 and 07-02-2013 results in epic   OSA on CPAP    pt states has older machine is very large   Peripheral neuropathy    PMB (postmenopausal bleeding)    Sarcoidosis of lung [facility])    pulmonology-- dr wert---  s/p brochoscopy 07-05-2016, no sarcoid  (dr wert released pt as needed per lov note in epic 06-02-2018)   Thyroid goiter    Type 2 diabetes mellitus treated with insulin [facility])    endocrinology--- dr Everardo All   (06-11-2019  per pt checks blood surgar daily in am,  fasting surgar-- 85 -- 130)   Wears glasses     Past Surgical History Past Surgical History:  Procedure Laterality Date   COLONOSCOPY WITH PROPOFOL N/A 09/21/2013   Procedure: COLONOSCOPY WITH PROPOFOL;  Surgeon: Charna Elizabeth, MD;  Location: [facility];  Service: Endoscopy;  Laterality: N/A;   CYST EXCISION  teen   right lower back, benign   DILATATION & CURETTAGE/HYSTEROSCOPY WITH MYOSURE N/A 06/16/2019   Procedure: DILATATION & CURETTAGE/HYSTEROSCOPY WITH MYOSURE/POLYPECTOMY;  Surgeon: Romualdo Bolk, MD;  Location: [facility];  Service:  Gynecology;  Laterality: N/A;  polypectomy   ENDOMETRIAL BIOPSY  2021   Dr. Gertie Exon   TUBAL LIGATION  Bilateral yrs ago   VIDEO BRONCHOSCOPY Bilateral 07/05/2016   Procedure: VIDEO BRONCHOSCOPY WITH FLUORO;  Surgeon: Nyoka Cowden, MD;  Location: [facility];  Service: Endoscopy;  Laterality: Bilateral;    Family History family history includes Heart disease in her brother and father; Hypertension in her father.  Social History Social History   Socioeconomic History   Marital status: Single    Spouse name: Not on file   Number of children: Not on file   Years of education: Not on file   Highest education level: Not on file  Occupational History   Not on file  Tobacco Use   Smoking status: Never   Smokeless tobacco: Never  Vaping Use   Vaping Use: Never used  Substance and Sexual Activity   Alcohol use: No   Drug use: Never   Sexual activity: Not on file  Other Topics Concern   Not on file  Social History Narrative   Lives with sister Aliene Beams, single, has 3 sons.   Doing some walking for exercise.  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  Awaiting disability determination.   06/2019.     Social Determinants of Health   Financial Resource Strain: Not on file  Food Insecurity: Not on file  Transportation Needs: Not on file  Physical Activity: Not on file  Stress: Not on file  Social Connections: Not on file  Intimate Partner Violence: Not on file    Lab Results  Component Value Date   HGBA1C 8.1 (H) 04/16/2022   Lab Results  Component Value Date   CHOL 134 05/31/2022   Lab Results  Component Value Date   HDL 40 (L) 05/31/2022   Lab Results  Component Value Date   LDLCALC 70 05/31/2022   Lab Results  Component Value Date   TRIG 153 (H) 05/31/2022   Lab Results  Component Value Date   CHOLHDL 3.4 05/31/2022   Lab Results  Component Value Date   CREATININE 1.21 (H) 06/04/2022   Lab Results  Component Value  Date   MICROALBUR 1.3 06/04/2022     Parts of this note may have been dictated using voice recognition software. There may be variances in spelling and vocabulary which are unintentional. Not all errors are proofread. Please notify the Thereasa Parkin if any discrepancies are noted or if the meaning of any statement is not clear.

## 2022-06-04 NOTE — Patient Instructions (Signed)

## 2022-06-05 ENCOUNTER — Other Ambulatory Visit: Payer: Self-pay | Admitting: Family Medicine

## 2022-06-05 ENCOUNTER — Encounter: Payer: Self-pay | Admitting: Dietician

## 2022-06-05 ENCOUNTER — Encounter: Payer: Commercial Managed Care - HMO | Attending: Endocrinology | Admitting: Dietician

## 2022-06-05 VITALS — Wt 324.0 lb

## 2022-06-05 DIAGNOSIS — Z794 Long term (current) use of insulin: Secondary | ICD-10-CM | POA: Diagnosis present

## 2022-06-05 DIAGNOSIS — E1165 Type 2 diabetes mellitus with hyperglycemia: Secondary | ICD-10-CM | POA: Insufficient documentation

## 2022-06-05 DIAGNOSIS — Z1231 Encounter for screening mammogram for malignant neoplasm of breast: Secondary | ICD-10-CM

## 2022-06-05 NOTE — Progress Notes (Signed)
Diabetes Self-Management Education  Visit Type: Follow-up  Appt. Start Time: 0840 Appt. End Time: 0910  06/05/2022  Amy Rosario, identified by name and date of birth, is a 62 y.o. female with a diagnosis of Diabetes:  .   ASSESSMENT Stopped drinking lemonade and has decided to drink mostly water, occasional coffee with sugar and cream. Fasting blood glucose 132 yesterday morning. She is checking her glucose daily. Is not interested in a CGM.  Exercise:  states that she has started but should do more.  5 days per week - video exercises  History includes Type 2 diabetes dx 2015 and insulin since 2017, HLD, HTN, OSA- on cpap, sarcoidosis, goiter, gout A1C 8.1% 04/16/2022 decreased from 8.7% 01/17/2022 increased from 6.8% 09/18/2021, BUN 19, Creatinine 1.21, Potassium 3.9, GFR 49 on  06/04/2022 Medications include:  Farxiga, Trulicity, Tresiba 130 units q am, Metformin, synthroid, MVI, potassium, vitamin D   Weight hx: 324 lbs 06/05/2022 325 lbs 03/12/2022 328 lbs 01/19/2022 318 lbs 01/18/2020  311 lbs 09/18/2019 316 lbs 08/24/2019 266 lbs 2018 291 lbs 08/02/2015 325 lbs 2016 (lost but stopping soda)   Patient lives with her sister and her sister's 51 yo son.  Her sister does the cooking now and patient will help.  She states that her sister told her to stop cooking when she got sick in 2018 as she would leave the stove on.   She has some memory issues which sister states have worsened. She is now on disability.  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 Avoids salt. Doesn't like raw vegetables as they hurt her teeth.  She states that she needs to go to the dentist and would like her teeth to be pulled. She was walking in the neighborhood until neighbor's pit bulls have been out. She does have [employer] but cannot pay for the extra gas easily.  Receptive to exercise programs on you tube.  Weight (!) 324 lb (147 kg), last menstrual period 11/09/2013. Body  mass index is 46.49 kg/m.   Diabetes Self-Management Education - 06/05/22 1600       Visit Information   Visit Type Follow-up      Health Coping   How would you rate your overall health? Fair      Psychosocial Assessment   Patient Belief/Attitude about Diabetes Motivated to manage diabetes    What is the hardest part about your diabetes right now, causing you the most concern, or is the most worrisome to you about your diabetes?   Making healty food and beverage choices      Pre-Education Assessment   Patient understands the diabetes disease and treatment process. Comprehends key points    Patient understands incorporating nutritional management into lifestyle. Comprehends key points    Patient undertands incorporating physical activity into lifestyle. Comprehends key points    Patient understands using medications safely. Comprehends key points    Patient understands monitoring blood glucose, interpreting and using results Comprehends key points    Patient understands prevention, detection, and treatment of acute complications. Comprehends key points    Patient understands prevention, detection, and treatment of chronic complications. Compreheands key points    Patient understands how to develop strategies to address psychosocial issues. Comprehends key points    Patient understands how to develop strategies to promote health/change behavior. Needs Review      Complications   Last HgB A1C per patient/outside source 8.1 %   04/16/2022 decreased from 8.7% 01/17/2022  How often do you check your blood sugar? 1-2 times/day    Fasting Blood glucose range (mg/dL) 16-109    Number of hypoglycemic episodes per month 0      Dietary Intake   Breakfast [employer] hashbrown, sausage, egg, and cheese McMuffin, coffee with cream and sugar    Snack (morning) none    Lunch smoked sausage, 1 piece [place] Chocolate cake    Dinner hot dog pepper's and onions    Snack (evening) none     Beverage(s) water, occasional coffee with cream and sugar      Activity / Exercise   Activity / Exercise Type ADL's      Patient Education   Previous Diabetes Education Yes (please comment)    Healthy Eating Meal options for control of blood glucose level and chronic complications.;Other (comment)   behavior modification   Being Active Role of exercise on diabetes management, blood pressure control and cardiac health.;Helped patient identify appropriate exercises in relation to his/her diabetes, diabetes complications and other health issue.    Medications Reviewed patients medication for diabetes, action, purpose, timing of dose and side effects.    Monitoring Other (comment)   current blood glucose     Individualized Goals (developed by patient)   Nutrition General guidelines for healthy choices and portions discussed    Physical Activity 15 minutes per day;Exercise 5-7 days per week    Medications take my medication as prescribed    Monitoring  Test my blood glucose as discussed    Problem Solving Eating Pattern      Patient Self-Evaluation of Goals - Patient rates self as meeting previously set goals (% of time)   Nutrition 50 - 75 % (half of the time)    Physical Activity >75% (most of the time)    Medications >75% (most of the time)    Monitoring >75% (most of the time)    Problem Solving and behavior change strategies  >75% (most of the time)    Reducing Risk (treating acute and chronic complications) >75% (most of the time)    Health Coping >75% (most of the time)      Post-Education Assessment   Patient understands the diabetes disease and treatment process. Demonstrates understanding / competency    Patient understands incorporating nutritional management into lifestyle. Needs Review    Patient undertands incorporating physical activity into lifestyle. Demonstrates understanding / competency    Patient understands using medications safely. Demonstrates understanding /  competency    Patient understands monitoring blood glucose, interpreting and using results Demonstrates understanding / competency    Patient understands prevention, detection, and treatment of acute complications. Demonstrates understanding / competency    Patient understands prevention, detection, and treatment of chronic complications. Demonstrates understanding / competency    Patient understands how to develop strategies to address psychosocial issues. Demonstrates understanding / competency    Patient understands how to develop strategies to promote health/change behavior. Needs Review      Outcomes   Expected Outcomes Demonstrated interest in learning. Expect positive outcomes    Future DMSE 3-4 months    Program Status Not Completed      Subsequent Visit   Since your last visit have you continued or begun to take your medications as prescribed? Yes    Since your last visit have you experienced any weight changes? Loss    Weight Loss (lbs) 1    Since your last visit, are you checking your blood glucose at least once a day?  Yes             Individualized Plan for Diabetes Self-Management Training:   Learning Objective:  Patient will have a greater understanding of diabetes self-management. Patient education plan is to attend individual and/or group sessions per assessed needs and concerns.   Plan:   Patient Instructions  Randie Heinz job on drinking more water and avoiding juice and sweetened drinks! Great job on adding the exercise!  Keep it up and slowly increase your time. Find ways to increase your vegetables  (1/2 your plate) Consider a meatless meal more often (include beans) Continue to be mindful about your sodium intake - avoid/limit processed meat   Expected Outcomes:  Demonstrated interest in learning. Expect positive outcomes  Education material provided:   If problems or questions, patient to contact team via:  Phone  Future DSME appointment: 3-4 months

## 2022-06-05 NOTE — Patient Instructions (Addendum)
Great job on drinking more water and avoiding juice and sweetened drinks! Great job on adding the exercise!  Keep it up and slowly increase your time. Find ways to increase your vegetables  (1/2 your plate) Consider a meatless meal more often (include beans) Continue to be mindful about your sodium intake - avoid/limit processed meat

## 2022-06-06 LAB — GLUTAMIC ACID DECARBOXYLASE AUTO ABS: Glutamic Acid Decarb Ab: <5 IU/mL (ref <5)

## 2022-07-10 NOTE — Progress Notes (Signed)
62 y.o. G33P3003 Single Black or African American Not Hispanic or Latino female here for annual exam.   She had more yeast infection due to the medication she taking. She is having mild itching now.  No vaginal bleeding. No bowel or bladder issue.    H/O HTN, elevated lipids, goiter, elevated BMI and DM. Last HgbA1C was 8.1 04/16/22.   Patient's last menstrual period was 11/09/2013.          Sexually active: No.  The current method of family planning is post menopausal status.    Exercising: Yes.     Body weight  Smoker:  no  Health Maintenance: Pap:  04/29/19 WNL 03/21/2017 WNL NEG HR HPV, 03-20-16 WNL NEG HR HPV, 05-27-13 LGSIL NEG HR HPV   History of abnormal Pap:  yes, LSIL, no surgery on her cerix.  MMG:  07/12/22 bi-rads 1 neg  BMD:  none  Colonoscopy: 09/21/13, f/u 10 years  TDaP:  05/27/13  Gardasil: n/a   reports that she has never smoked. She has never used smokeless tobacco. She reports that she does not drink alcohol and does not use drugs. She is on disability. 3 grown sons, 14 grand kids, 2 babies on the way. Everyone is local.    Past Medical History:  Diagnosis Date   Chronic gout    followed by pcp---  multiple sites (06-11-2019 per pt last episode fall 2020)   H/O echocardiogram 04-04-2018  received via fax on 06-11-2019 to be scanned in   Dr. Rinaldo Cloud, [facility]-   Hyperlipidemia    Hypertension 2000   managed by Dr. Sharyn Lull, cardiology   Hypokalemia    Hypothyroidism    endocrinologist-- dr Everardo All-- s/p bx's 03-18-2018 and 07-02-2013 results in epic   OSA on CPAP    pt states has older machine is very large   Peripheral neuropathy    PMB (postmenopausal bleeding)    Sarcoidosis of lung [facility])    pulmonology-- dr wert---  s/p brochoscopy 07-05-2016, no sarcoid  (dr wert released pt as needed per lov note in epic 06-02-2018)   Thyroid goiter    Type 2 diabetes mellitus treated with insulin [facility])    endocrinology--- dr Everardo All    (06-11-2019  per pt checks blood surgar daily in am,  fasting surgar-- 85 -- 130)   Wears glasses     Past Surgical History:  Procedure Laterality Date   COLONOSCOPY WITH PROPOFOL N/A 09/21/2013   Procedure: COLONOSCOPY WITH PROPOFOL;  Surgeon: Charna Elizabeth, MD;  Location: [facility];  Service: Endoscopy;  Laterality: N/A;   CYST EXCISION  teen   right lower back, benign   DILATATION & CURETTAGE/HYSTEROSCOPY WITH MYOSURE N/A 06/16/2019   Procedure: DILATATION & CURETTAGE/HYSTEROSCOPY WITH MYOSURE/POLYPECTOMY;  Surgeon: Romualdo Bolk, MD;  Location: [facility];  Service: Gynecology;  Laterality: N/A;  polypectomy   ENDOMETRIAL BIOPSY  2021   Dr. Gertie Exon   TUBAL LIGATION Bilateral yrs ago   VIDEO BRONCHOSCOPY Bilateral 07/05/2016   Procedure: VIDEO BRONCHOSCOPY WITH FLUORO;  Surgeon: Nyoka Cowden, MD;  Location: [facility];  Service: Endoscopy;  Laterality: Bilateral;    Current Outpatient Medications  Medication Sig Dispense Refill   allopurinol (ZYLOPRIM) 300 MG tablet TAKE 1 TABLET BY MOUTH EVERY DAY 90 tablet 0   amLODipine (NORVASC) 10 MG tablet TAKE 1 TABLET BY MOUTH EVERY DAY 30 tablet 0   aspirin 81 MG EC tablet TAKE 1 TABLET BY MOUTH EVERY DAY 30 tablet 2  bisoprolol (ZEBETA) 10 MG tablet TAKE 1 TABLET BY MOUTH EVERY DAY 30 tablet 0   Blood Glucose Monitoring Suppl (ONE TOUCH ULTRA 2) w/Device KIT 1 Device by Does not apply route 2 (two) times daily. 1 each 0   cloNIDine (CATAPRES) 0.1 MG tablet Take 0.1 mg by mouth 2 (two) times daily.     D-1000 EXTRA STRENGTH 25 MCG (1000 UT) tablet TAKE 1 TABLET BY MOUTH EVERY DAY 90 tablet 1   dapagliflozin propanediol (FARXIGA) 10 MG TABS tablet Take 1 tablet (10 mg total) by mouth daily before breakfast. 90 tablet 2   Dulaglutide (TRULICITY) 4.5 MG/0.5ML SOPN Inject 4.5 mg as directed once a week. 6 mL 0   gabapentin (NEURONTIN) 100 MG capsule TAKE 1 CAPSULE BY MOUTH AT BEDTIME. 90 capsule 3   Glucagon HCl  (GLUCAGON EMERGENCY) 1 MG/ML SOLR Inject 1 mg as directed as needed (low blood sugar with impaired consciouness). 2 each 1   glucose blood (ONETOUCH VERIO) test strip CHECKS SUGARS 4 TIMES DAILY, ON MEAL TIME INSULIN 100 strip 6   insulin degludec (TRESIBA FLEXTOUCH) 200 UNIT/ML FlexTouch Pen Inject 130 Units into the skin daily. And pen needles 1/day 60 mL 6   Insulin Pen Needle (PEN NEEDLES) 33G X 4 MM MISC Use as instructed to inject insulin 1/day 100 each 4   Lancets (ONETOUCH DELICA PLUS LANCET33G) MISC USE TWICE A DAY 100 each 2   levothyroxine (SYNTHROID) 50 MCG tablet Take 1 tablet (50 mcg total) by mouth daily. 90 tablet 3   meclizine (ANTIVERT) 25 MG tablet TAKE 1 TABLET BY MOUTH TWICE A DAY 30 tablet 0   metFORMIN (GLUCOPHAGE) 1000 MG tablet TAKE 1 TABLET (1,000 MG TOTAL) BY MOUTH 2 (TWO) TIMES DAILY WITH A MEAL. 180 tablet 3   Multiple Vitamin (MULTIVITAMIN) tablet Take 1 tablet by mouth daily.     potassium chloride (KLOR-CON) 20 MEQ packet Take 20 mEq by mouth 2 (two) times daily. (Patient taking differently: Take 20 mEq by mouth 2 (two) times daily.) 180 each 3   rosuvastatin (CRESTOR) 20 MG tablet TAKE 1 TABLET BY MOUTH EVERY DAY 90 tablet 0   spironolactone (ALDACTONE) 25 MG tablet Take 25 mg by mouth daily.     valsartan-hydrochlorothiazide (DIOVAN-HCT) 320-25 MG tablet TAKE 1 TABLET BY MOUTH EVERY DAY 30 tablet 5   No current facility-administered medications for this visit.    Family History  Problem Relation Age of Onset   Heart disease Father    Hypertension Father    Heart disease Brother    Cancer Neg Hx    Stroke Neg Hx    Diabetes Neg Hx     Review of Systems  All other systems reviewed and are negative.   Exam:   BP 110/70   Pulse 85   Ht 5\' 10"  (1.778 m)   Wt (!) 320 lb (145.2 kg)   LMP 11/09/2013   SpO2 100%   BMI 45.92 kg/m   Weight change: @WEIGHTCHANGE @ Height:   Height: 5\' 10"  (177.8 cm)  Ht Readings from Last 3 Encounters:  07/18/22 5\' 10"   (1.778 m)  06/04/22 5\' 10"  (1.778 m)  04/20/22 5\' 10"  (1.778 m)    General appearance: alert, cooperative and appears stated age Head: Normocephalic, without obvious abnormality, atraumatic Neck: no adenopathy, supple, symmetrical, trachea midline and thyroid normal to inspection and palpation Lungs: clear to auscultation bilaterally Cardiovascular: regular rate and rhythm Breasts: normal appearance, no masses or tenderness Abdomen: soft, non-tender; non  distended,  no masses,  no organomegaly Extremities: extremities normal, atraumatic, no cyanosis or edema Skin: Skin color, texture, turgor normal. Increased pigmentation/irritation under both breast and the panus.  Lymph nodes: Cervical, supraclavicular, and axillary nodes normal. No abnormal inguinal nodes palpated Neurologic: Grossly normal   Pelvic: External genitalia:  no lesions              Urethra:  normal appearing urethra with no masses, tenderness or lesions              Bartholins and Skenes: normal                 Vagina: normal appearing vagina with normal color and discharge, no lesions              Cervix: no lesions               Bimanual Exam:  Uterus:   no masses or tenderness              Adnexa: no mass, fullness, tenderness               Rectovaginal: Confirms               Anus:  normal sphincter tone, no lesions  Carolynn Serve, CMA chaperoned for the exam.  1. Well woman exam Discussed breast self exam Discussed calcium and vit D intake Mammogram and colonoscopy UTD Labs with primary  2. Screening for cervical cancer - Cytology - PAP  3. Subacute vulvitis - WET PREP FOR TRICH, YEAST, CLUE - clobetasol ointment (TEMOVATE) 0.05 %; Apply a pea sized amount to the vulva BID for up to 2 weeks as needed  Dispense: 60 g; Refill: 0  4. Candidal intertrigo - nystatin cream (MYCOSTATIN); Apply 1 Application topically 2 (two) times daily. Apply to affected area (under breasts and stomach) BID for up to 7 days.   Dispense: 30 g; Refill: 1

## 2022-07-12 ENCOUNTER — Ambulatory Visit
Admission: RE | Admit: 2022-07-12 | Discharge: 2022-07-12 | Disposition: A | Discharge status: Home / Self Care | Payer: Commercial Managed Care - HMO | Source: Ambulatory Visit | Attending: Family Medicine | Admitting: Family Medicine

## 2022-07-12 DIAGNOSIS — Z1231 Encounter for screening mammogram for malignant neoplasm of breast: Secondary | ICD-10-CM

## 2022-07-18 ENCOUNTER — Encounter: Payer: Self-pay | Admitting: Obstetrics and Gynecology

## 2022-07-18 ENCOUNTER — Ambulatory Visit (INDEPENDENT_AMBULATORY_CARE_PROVIDER_SITE_OTHER): Payer: Commercial Managed Care - HMO | Admitting: Obstetrics and Gynecology

## 2022-07-18 ENCOUNTER — Other Ambulatory Visit (HOSPITAL_COMMUNITY)
Admission: RE | Admit: 2022-07-18 | Discharge: 2022-07-18 | Disposition: A | Discharge status: Home / Self Care | Payer: Commercial Managed Care - HMO | Source: Ambulatory Visit | Attending: Obstetrics and Gynecology | Admitting: Obstetrics and Gynecology

## 2022-07-18 VITALS — BP 110/70 | HR 85 | Ht 70.0 in | Wt 320.0 lb

## 2022-07-18 DIAGNOSIS — B372 Candidiasis of skin and nail: Secondary | ICD-10-CM

## 2022-07-18 DIAGNOSIS — Z124 Encounter for screening for malignant neoplasm of cervix: Secondary | ICD-10-CM | POA: Insufficient documentation

## 2022-07-18 DIAGNOSIS — Z01419 Encounter for gynecological examination (general) (routine) without abnormal findings: Secondary | ICD-10-CM

## 2022-07-18 DIAGNOSIS — N763 Subacute and chronic vulvitis: Secondary | ICD-10-CM

## 2022-07-18 DIAGNOSIS — Z1211 Encounter for screening for malignant neoplasm of colon: Secondary | ICD-10-CM | POA: Insufficient documentation

## 2022-07-18 LAB — WET PREP FOR TRICH, YEAST, CLUE

## 2022-07-18 MED ORDER — CLOBETASOL PROPIONATE 0.05 % EX OINT: TOPICAL_OINTMENT | CUTANEOUS | 0 refills | Status: DC

## 2022-07-18 MED ORDER — NYSTATIN 100000 UNIT/GM EX CREA: 1.0000 | TOPICAL_CREAM | Freq: Two times a day (BID) | CUTANEOUS | 1 refills | Status: DC

## 2022-07-18 NOTE — Patient Instructions (Signed)

## 2022-07-23 LAB — CYTOLOGY - PAP
Adequacy: ABSENT
Comment: NEGATIVE
Diagnosis: UNDETERMINED — AB
High risk HPV: NEGATIVE

## 2022-08-22 ENCOUNTER — Other Ambulatory Visit: Payer: Self-pay

## 2022-08-22 DIAGNOSIS — E1165 Type 2 diabetes mellitus with hyperglycemia: Secondary | ICD-10-CM

## 2022-08-22 MED ORDER — TRULICITY 4.5 MG/0.5ML ~~LOC~~ SOAJ: 4.5000 mg | SUBCUTANEOUS | 0 refills | Status: DC

## 2022-08-28 ENCOUNTER — Telehealth: Payer: Self-pay

## 2022-08-28 DIAGNOSIS — Z794 Long term (current) use of insulin: Secondary | ICD-10-CM

## 2022-08-28 MED ORDER — TRULICITY 3 MG/0.5ML ~~LOC~~ SOAJ: 3.0000 mg | SUBCUTANEOUS | 0 refills | Status: DC

## 2022-08-28 NOTE — Telephone Encounter (Signed)
Amy Rosario, CMA  ?

## 2022-08-30 ENCOUNTER — Telehealth: Payer: Self-pay | Admitting: Pharmacy Technician

## 2022-08-30 ENCOUNTER — Other Ambulatory Visit (HOSPITAL_COMMUNITY): Payer: Self-pay

## 2022-08-30 NOTE — Telephone Encounter (Signed)
Pharmacy Patient Advocate Encounter  Received notification from [employer] that Prior Authorization for Trulicity4.5mg  has been APPROVED from 08/28/22 to 08/28/23. Ran test claim, Copay is $25. Pt can fill when she's ready.   PA #/Case ID/Reference #: [id number]

## 2022-09-07 ENCOUNTER — Other Ambulatory Visit: Payer: Self-pay | Admitting: "Endocrinology

## 2022-09-07 ENCOUNTER — Other Ambulatory Visit (INDEPENDENT_AMBULATORY_CARE_PROVIDER_SITE_OTHER): Payer: Commercial Managed Care - HMO

## 2022-09-07 DIAGNOSIS — E1165 Type 2 diabetes mellitus with hyperglycemia: Secondary | ICD-10-CM

## 2022-09-07 LAB — HEMOGLOBIN A1C: Hgb A1c MFr Bld: 8.2 % — ABNORMAL HIGH (ref 4.6–6.5)

## 2022-09-07 NOTE — Addendum Note (Signed)
Addended by: Barnet Glasgow on: 09/07/2022 08:48 AM   Modules accepted: Orders

## 2022-09-11 ENCOUNTER — Encounter: Payer: Self-pay | Admitting: "Endocrinology

## 2022-09-11 ENCOUNTER — Ambulatory Visit: Payer: BC Managed Care – PPO | Admitting: "Endocrinology

## 2022-09-11 VITALS — BP 150/100 | HR 83 | Ht 70.0 in | Wt 324.0 lb

## 2022-09-11 DIAGNOSIS — E782 Mixed hyperlipidemia: Secondary | ICD-10-CM

## 2022-09-11 DIAGNOSIS — Z794 Long term (current) use of insulin: Secondary | ICD-10-CM

## 2022-09-11 DIAGNOSIS — E1165 Type 2 diabetes mellitus with hyperglycemia: Secondary | ICD-10-CM | POA: Diagnosis not present

## 2022-09-11 DIAGNOSIS — G629 Polyneuropathy, unspecified: Secondary | ICD-10-CM | POA: Diagnosis not present

## 2022-09-11 MED ORDER — METFORMIN HCL 1000 MG PO TABS: 1000.0000 mg | ORAL_TABLET | Freq: Two times a day (BID) | ORAL | 1 refills | Status: AC

## 2022-09-11 MED ORDER — TRESIBA FLEXTOUCH 200 UNIT/ML ~~LOC~~ SOPN: 130.0000 [IU] | PEN_INJECTOR | Freq: Every day | SUBCUTANEOUS | 3 refills | Status: DC

## 2022-09-11 MED ORDER — DAPAGLIFLOZIN PROPANEDIOL 10 MG PO TABS: 10.0000 mg | ORAL_TABLET | Freq: Every day | ORAL | 2 refills | Status: DC

## 2022-09-11 NOTE — Progress Notes (Signed)
Outpatient Endocrinology Note  Amy Rosario 1960-02-18 [id number]   Referring Provider: Harvest Forest, MD Primary Care Provider: Harvest Forest, MD Reason for consultation: Subjective  Type 2 diabetes mellitus  Assessment & Plan  Diagnoses and all orders for this visit:  Uncontrolled type 2 diabetes mellitus with hyperglycemia (HCC) -     dapagliflozin propanediol (FARXIGA) 10 MG TABS tablet; Take 1 tablet (10 mg total) by mouth daily before breakfast. -     metFORMIN (GLUCOPHAGE) 1000 MG tablet; Take 1 tablet (1,000 mg total) by mouth 2 (two) times daily with a meal. -     insulin degludec (TRESIBA FLEXTOUCH) 200 UNIT/ML FlexTouch Pen; Inject 130 Units into the skin daily. And pen needles 1/day  Long-term insulin use (HCC)  Neuropathy  Mixed hypercholesterolemia and hypertriglyceridemia   Diabetes complicated by neuropathy, nephropathy with low GFR Hba1c goal less than 7, current Hba1c is 8.1 . Will recommend for the following change of medications to: Tresiba 65 units bid After finishing Trulicity 3 mg weekly->start 4.5 mg weekly Metformin 1000 mg bid Farxiga 10mg  every day->cut to half a pill if UTI occurs  No known contraindications to any of above medications Glucagon ordered 06/04/22 Doesn't want CGM  Hyperlipidemia -Last LDL at goal: 70 -on rosuvastatin 20 mg QD -Follow low fat diet and exercise   -Blood pressure - Microalbumin/creatinine goal < 30 -On ACE/ARB Valsartan 320 mg qd -BP off goal: missed BP meds today, take meds, check BP at home and contact PCP -diet changes including salt restriction -limit eating outside -counseled BP targets per standards of diabetes care -Uncontrolled blood pressure can lead to retinopathy, nephropathy and cardiovascular and atherosclerotic heart disease  Reviewed and counseled on: -A1C target -Blood sugar targets -Complications of uncontrolled diabetes  -Checking blood sugar before meals and bedtime  and bring log next visit -All medications with mechanism of action and side effects -Hypoglycemia management: rule of 15's, Glucagon Emergency Kit and medical alert ID -low-carb low-fat plate-method diet -At least 20 minutes of physical activity per day -Annual dilated retinal eye exam and foot exam -compliance and follow up needs -follow up as scheduled or earlier if problem gets worse  Call if blood sugar is less than 70 or consistently above 250    Take a 15 gm snack of carbohydrate at bedtime before you go to sleep if your blood sugar is less than 100.    If you are going to fast after midnight for a test or procedure, ask your physician for instructions on how to reduce/decrease your insulin dose.    Call if blood sugar is less than 70 or consistently above 250  -Treating a low sugar by rule of 15  (15 gms of sugar every 15 min until sugar is more than 70) If you feel your sugar is low, test your sugar to be sure If your sugar is low (less than 70), then take 15 grams of a fast acting Carbohydrate (3-4 glucose tablets or glucose gel or 4 ounces of juice or regular soda) Recheck your sugar 15 min after treating low to make sure it is more than 70 If sugar is still less than 70, treat again with 15 grams of carbohydrate          Don't drive the hour of hypoglycemia  If unconscious/unable to eat or drink by mouth, use glucagon injection or nasal spray baqsimi and call 911. Can repeat again in 15 min if still unconscious.  Return  in about 3 months (around 12/12/2022).   I spent more than 50% of today's visit counseling patient on symptoms, examination findings, lab findings, imaging results, treatment decisions and monitoring and prognosis. The patient understood the recommendations and agrees with the treatment plan. All questions regarding treatment plan were fully answered  Altamese Alameda, MD    History of Present Illness Amy Rosario is a 62 y.o. year old female who presents  for a new referral for Type 2 diabetes mellitus.  Amy Rosario was first diagnosed in 2015.   Diabetes education +2  Home diabetes regimen: Tresiba 130 units qam Trulicity 3 mg weekly Metformin 1000 mg bid Farxiga 10mg  qd  Not taking Novolog   COMPLICATIONS -  MI/Stroke -  retinopathy, last eye exam 2022 +  neuropathy, last foot exam 2022-on gabapentin 100 mg tid +  nephropathy  BLOOD SUGAR DATA Check 0-1/day BG 119-136  Physical Exam  BP (!) 150/100   Pulse 83   Ht 5\' 10"  (1.778 m)   Wt (!) 324 lb (147 kg)   LMP 11/09/2013   SpO2 97%   BMI 46.49 kg/m    Constitutional: well developed, well nourished Head: normocephalic, atraumatic Eyes: sclera anicteric, no redness Neck: supple Lungs: normal respiratory effort Neurology: alert and oriented Skin: dry, no appreciable rashes Musculoskeletal: no appreciable defects Psychiatric: normal mood and affect Diabetic Foot Exam - Simple   No data filed     Current Medications Patient's Medications  New Prescriptions   No medications on file  Previous Medications   ALLOPURINOL (ZYLOPRIM) 300 MG TABLET    TAKE 1 TABLET BY MOUTH EVERY DAY   AMLODIPINE (NORVASC) 10 MG TABLET    TAKE 1 TABLET BY MOUTH EVERY DAY   ASPIRIN 81 MG EC TABLET    TAKE 1 TABLET BY MOUTH EVERY DAY   BISOPROLOL (ZEBETA) 10 MG TABLET    TAKE 1 TABLET BY MOUTH EVERY DAY   BLOOD GLUCOSE MONITORING SUPPL (ONE TOUCH ULTRA 2) W/DEVICE KIT    1 Device by Does not apply route 2 (two) times daily.   CLOBETASOL OINTMENT (TEMOVATE) 0.05 %    Apply a pea sized amount to the vulva BID for up to 2 weeks as needed   CLONIDINE (CATAPRES) 0.1 MG TABLET    Take 0.1 mg by mouth 2 (two) times daily.   D-1000 EXTRA STRENGTH 25 MCG (1000 UT) TABLET    TAKE 1 TABLET BY MOUTH EVERY DAY   DULAGLUTIDE (TRULICITY) 4.5 MG/0.5ML SOPN    Inject 4.5 mg as directed once a week.   GABAPENTIN (NEURONTIN) 100 MG CAPSULE    TAKE 1 CAPSULE BY MOUTH AT BEDTIME.   GLUCAGON HCL  (GLUCAGON EMERGENCY) 1 MG/ML SOLR    Inject 1 mg as directed as needed (low blood sugar with impaired consciouness).   GLUCOSE BLOOD (ONETOUCH VERIO) TEST STRIP    CHECKS SUGARS 4 TIMES DAILY, ON MEAL TIME INSULIN   INSULIN PEN NEEDLE (PEN NEEDLES) 33G X 4 MM MISC    Use as instructed to inject insulin 1/day   LANCETS (ONETOUCH DELICA PLUS LANCET33G) MISC    USE TWICE A DAY   LEVOTHYROXINE (SYNTHROID) 50 MCG TABLET    Take 1 tablet (50 mcg total) by mouth daily.   MECLIZINE (ANTIVERT) 25 MG TABLET    TAKE 1 TABLET BY MOUTH TWICE A DAY   MULTIPLE VITAMIN (MULTIVITAMIN) TABLET    Take 1 tablet by mouth daily.   NYSTATIN CREAM (MYCOSTATIN)  Apply 1 Application topically 2 (two) times daily. Apply to affected area (under breasts and stomach) BID for up to 7 days.   POTASSIUM CHLORIDE (KLOR-CON) 20 MEQ PACKET    Take 20 mEq by mouth 2 (two) times daily.   ROSUVASTATIN (CRESTOR) 20 MG TABLET    TAKE 1 TABLET BY MOUTH EVERY DAY   SPIRONOLACTONE (ALDACTONE) 25 MG TABLET    Take 25 mg by mouth daily.   VALSARTAN-HYDROCHLOROTHIAZIDE (DIOVAN-HCT) 320-25 MG TABLET    TAKE 1 TABLET BY MOUTH EVERY DAY  Modified Medications   Modified Medication Previous Medication   DAPAGLIFLOZIN PROPANEDIOL (FARXIGA) 10 MG TABS TABLET dapagliflozin propanediol (FARXIGA) 10 MG TABS tablet      Take 1 tablet (10 mg total) by mouth daily before breakfast.    Take 1 tablet (10 mg total) by mouth daily before breakfast.   INSULIN DEGLUDEC (TRESIBA FLEXTOUCH) 200 UNIT/ML FLEXTOUCH PEN insulin degludec (TRESIBA FLEXTOUCH) 200 UNIT/ML FlexTouch Pen      Inject 130 Units into the skin daily. And pen needles 1/day    Inject 130 Units into the skin daily. And pen needles 1/day   METFORMIN (GLUCOPHAGE) 1000 MG TABLET metFORMIN (GLUCOPHAGE) 1000 MG tablet      Take 1 tablet (1,000 mg total) by mouth 2 (two) times daily with a meal.    TAKE 1 TABLET (1,000 MG TOTAL) BY MOUTH 2 (TWO) TIMES DAILY WITH A MEAL.  Discontinued Medications    DULAGLUTIDE (TRULICITY) 3 MG/0.5ML SOPN    Inject 3 mg into the skin once a week.    Allergies Allergies  Allergen Reactions   Penicillins Hives and Swelling    Has patient had a PCN reaction causing immediate rash, facial/tongue/throat swelling, SOB or lightheadedness with hypotension: Yes Has patient had a PCN reaction causing severe rash involving mucus membranes or skin necrosis: No Has patient had a PCN reaction that required hospitalization: No Has patient had a PCN reaction occurring within the last 10 years: No If all of the above answers are "NO", then may proceed with Cephalosporin use.    Past Medical History Past Medical History:  Diagnosis Date   Chronic gout    followed by pcp---  multiple sites (06-11-2019 per pt last episode fall 2020)   H/O echocardiogram 04-04-2018  received via fax on 06-11-2019 to be scanned in   Dr. Rinaldo Cloud, [facility]-   Hyperlipidemia    Hypertension 2000   managed by Dr. Sharyn Lull, cardiology   Hypokalemia    Hypothyroidism    endocrinologist-- dr Everardo All-- s/p bx's 03-18-2018 and 07-02-2013 results in epic   OSA on CPAP    pt states has older machine is very large   Peripheral neuropathy    PMB (postmenopausal bleeding)    Sarcoidosis of lung [facility])    pulmonology-- dr wert---  s/p brochoscopy 07-05-2016, no sarcoid  (dr wert released pt as needed per lov note in epic 06-02-2018)   Thyroid goiter    Type 2 diabetes mellitus treated with insulin [facility])    endocrinology--- dr Everardo All   (06-11-2019  per pt checks blood surgar daily in am,  fasting surgar-- 85 -- 130)   Wears glasses     Past Surgical History Past Surgical History:  Procedure Laterality Date   COLONOSCOPY WITH PROPOFOL N/A 09/21/2013   Procedure: COLONOSCOPY WITH PROPOFOL;  Surgeon: Charna Elizabeth, MD;  [facility]: [facility];  Service: Endoscopy;  Laterality: N/A;   CYST EXCISION  teen   right lower back,  benign   DILATATION &  CURETTAGE/HYSTEROSCOPY WITH MYOSURE N/A 06/16/2019   Procedure: DILATATION & CURETTAGE/HYSTEROSCOPY WITH MYOSURE/POLYPECTOMY;  Surgeon: Romualdo Bolk, MD;  Location: [facility];  Service: Gynecology;  Laterality: N/A;  polypectomy   ENDOMETRIAL BIOPSY  2021   Dr. Gertie Exon   TUBAL LIGATION Bilateral yrs ago   VIDEO BRONCHOSCOPY Bilateral 07/05/2016   Procedure: VIDEO BRONCHOSCOPY WITH FLUORO;  Surgeon: Nyoka Cowden, MD;  [facility];  Service: Endoscopy;  Laterality: Bilateral;    Family History family history includes Heart disease in her brother and father; Hypertension in her father.  Social History Social History   Socioeconomic History   Marital status: Single    Spouse name: Not on file   Number of children: Not on file   Years of education: Not on file   Highest education level: Not on file  Occupational History   Not on file  Tobacco Use   Smoking status: Never   Smokeless tobacco: Never  Vaping Use   Vaping status: Never Used  Substance and Sexual Activity   Alcohol use: No   Drug use: Never   Sexual activity: Not on file  Other Topics Concern   Not on file  Social History Narrative   Lives with sister Aliene Beams, single, has 3 sons.   Doing some walking for exercise.  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  Awaiting disability determination.   06/2019.     Social Determinants of Health   Financial Resource Strain: Not on file  Food Insecurity: Not on file  Transportation Needs: Not on file  Physical Activity: Not on file  Stress: Not on file  Social Connections: Not on file  Intimate Partner Violence: Not on file    Lab Results  Component Value Date   HGBA1C 8.2 (H) 09/07/2022   Lab Results  Component Value Date   CHOL 134 05/31/2022   Lab Results  Component Value Date   HDL 40 (L) 05/31/2022   Lab Results  Component Value Date   LDLCALC 70 05/31/2022   Lab Results   Component Value Date   TRIG 153 (H) 05/31/2022   Lab Results  Component Value Date   CHOLHDL 3.4 05/31/2022   Lab Results  Component Value Date   CREATININE 1.21 (H) 06/04/2022   Lab Results  Component Value Date   MICROALBUR 1.3 06/04/2022     Parts of this note may have been dictated using voice recognition software. There may be variances in spelling and vocabulary which are unintentional. Not all errors are proofread. Please notify the Thereasa Parkin if any discrepancies are noted or if the meaning of any statement is not clear.

## 2022-09-11 NOTE — Patient Instructions (Addendum)
Start tresiba 65 units twice a day Continue rest the same ________   Goals of DM therapy:  Morning Fasting blood sugar: 80-140  Blood sugar before meals: 80-140 Bed time blood sugar: 100-150  A1C <7%, limited only by hypoglycemia  1.Diabetes medications and their side effects discussed, including hypoglycemia    2. Check blood glucose:  a) Always check blood sugars before driving. Please see below (under hypoglycemia) on how to manage b) Check a minimum of 3 times/day or more as needed when having symptoms of hypoglycemia.   c) Try to check blood glucose before sleeping/in the middle of the night to ensure that it is remaining stable and not dropping less than 100 d) Check blood glucose more often if sick  3. Diet: a) 3 meals per day schedule b: Restrict carbs to 60-70 grams (4 servings) per meal c) Colorful vegetables - 3 servings a day, and low sugar fruit 2 servings/day Plate control method: 1/4 plate protein, 1/4 starch, 1/2 green, yellow, or red vegetables d) Avoid carbohydrate snacks unless hypoglycemic episode, or increased physical activity  4. Regular exercise as tolerated, preferably 3 or more hours a week  5. Hypoglycemia: a)  Do not drive or operate machinery without first testing blood glucose to assure it is over 90 mg%, or if dizzy, lightheaded, not feeling normal, etc, or  if foot or leg is numb or weak. b)  If blood glucose less than 70, take four 5gm Glucose tabs or 15-30 gm Glucose gel.  Repeat every 15 min as needed until blood sugar is >100 mg/dl. If hypoglycemia persists then call 911.   6. Sick day management: a) Check blood glucose more often b) Continue usual therapy if blood sugars are elevated.   7. Contact the doctor immediately if blood glucose is frequently <60 mg/dl, or an episode of severe hypoglycemia occurs (where someone had to give you glucose/  glucagon or if you passed out from a low blood glucose), or if blood glucose is persistently >350  mg/dl, for further management  8. A change in level of physical activity or exercise and a change in diet may also affect your blood sugar. Check blood sugars more often and call if needed.  Instructions: 1. Bring glucose meter, blood glucose records on every visit for review 2. Continue to follow up with primary care physician and other providers for medical care 3. Yearly eye  and foot exam 4. Please get blood work done prior to the next appointment

## 2022-09-27 ENCOUNTER — Ambulatory Visit: Payer: BC Managed Care – PPO | Admitting: Dietician

## 2022-11-04 ENCOUNTER — Other Ambulatory Visit: Payer: Self-pay | Admitting: "Endocrinology

## 2022-11-04 DIAGNOSIS — E1165 Type 2 diabetes mellitus with hyperglycemia: Secondary | ICD-10-CM

## 2022-11-05 ENCOUNTER — Other Ambulatory Visit: Payer: Self-pay

## 2022-11-05 DIAGNOSIS — E1165 Type 2 diabetes mellitus with hyperglycemia: Secondary | ICD-10-CM

## 2022-11-05 MED ORDER — BD PEN NEEDLE NANO 2ND GEN 32G X 4 MM MISC: 1.0000 | Freq: Every day | 4 refills | Status: AC

## 2022-12-02 ENCOUNTER — Other Ambulatory Visit: Payer: Self-pay | Admitting: Medical

## 2022-12-02 ENCOUNTER — Other Ambulatory Visit: Payer: Self-pay | Admitting: "Endocrinology

## 2022-12-02 DIAGNOSIS — E1165 Type 2 diabetes mellitus with hyperglycemia: Secondary | ICD-10-CM

## 2022-12-03 ENCOUNTER — Other Ambulatory Visit: Payer: Self-pay

## 2022-12-03 DIAGNOSIS — E1165 Type 2 diabetes mellitus with hyperglycemia: Secondary | ICD-10-CM

## 2022-12-03 MED ORDER — TRULICITY 4.5 MG/0.5ML ~~LOC~~ SOAJ: 4.5000 mg | SUBCUTANEOUS | 0 refills | Status: DC

## 2022-12-03 NOTE — Telephone Encounter (Signed)
Has new PCP

## 2022-12-10 ENCOUNTER — Other Ambulatory Visit (HOSPITAL_COMMUNITY): Payer: Self-pay

## 2022-12-12 ENCOUNTER — Other Ambulatory Visit (INDEPENDENT_AMBULATORY_CARE_PROVIDER_SITE_OTHER): Payer: BC Managed Care – PPO

## 2022-12-12 ENCOUNTER — Encounter: Payer: Self-pay | Admitting: "Endocrinology

## 2022-12-12 ENCOUNTER — Ambulatory Visit (INDEPENDENT_AMBULATORY_CARE_PROVIDER_SITE_OTHER): Payer: BC Managed Care – PPO | Admitting: "Endocrinology

## 2022-12-12 VITALS — BP 140/84 | HR 98 | Ht 70.0 in | Wt 322.8 lb

## 2022-12-12 DIAGNOSIS — E1142 Type 2 diabetes mellitus with diabetic polyneuropathy: Secondary | ICD-10-CM

## 2022-12-12 DIAGNOSIS — E1165 Type 2 diabetes mellitus with hyperglycemia: Secondary | ICD-10-CM

## 2022-12-12 DIAGNOSIS — Z794 Long term (current) use of insulin: Secondary | ICD-10-CM

## 2022-12-12 DIAGNOSIS — G629 Polyneuropathy, unspecified: Secondary | ICD-10-CM

## 2022-12-12 DIAGNOSIS — E782 Mixed hyperlipidemia: Secondary | ICD-10-CM | POA: Diagnosis not present

## 2022-12-12 LAB — POCT GLYCOSYLATED HEMOGLOBIN (HGB A1C): Hemoglobin A1C: 7.4 % — AB (ref 4.0–5.6)

## 2022-12-12 MED ORDER — D-1000 EXTRA STRENGTH 25 MCG (1000 UT) PO TABS: 1000.0000 [IU] | ORAL_TABLET | Freq: Every day | ORAL | 1 refills | Status: DC

## 2022-12-12 MED ORDER — TRULICITY 4.5 MG/0.5ML ~~LOC~~ SOAJ: 4.5000 mg | SUBCUTANEOUS | 0 refills | Status: DC

## 2022-12-12 NOTE — Progress Notes (Signed)
Outpatient Endocrinology Note  Amy Rosario Jun 19, 1960 [id number]   Referring Provider: Harvest Forest, MD Primary Care Provider: Harvest Forest, MD Reason for consultation: Subjective  Type 2 diabetes mellitus  Assessment & Plan  Diagnoses and all orders for this visit:  Uncontrolled type 2 diabetes mellitus with hyperglycemia (HCC)  Long-term insulin use (HCC)  Neuropathy  Mixed hypercholesterolemia and hypertriglyceridemia    Diabetes complicated by neuropathy, nephropathy with low GFR Hba1c goal less than 7, current Hba1c is 7.4. Will recommend for the following change of medications to: Tresiba 65 units bid Trulicity 4.5 mg weekly Metformin 1000 mg bid Farxiga 10mg  every day->cut to half a pill if UTI occurs  No known contraindications to any of above medications Glucagon ordered 06/04/22 Doesn't want CGM  Hyperlipidemia -Last LDL at goal: 70 -on rosuvastatin 20 mg QD -Follow low fat diet and exercise   -Blood pressure - Microalbumin/creatinine goal < 30 -On ACE/ARB Valsartan 320 mg qd -BP off goal: missed BP meds today, take meds, check BP at home and contact PCP -diet changes including salt restriction -limit eating outside -counseled BP targets per standards of diabetes care -Uncontrolled blood pressure can lead to retinopathy, nephropathy and cardiovascular and atherosclerotic heart disease  Reviewed and counseled on: -A1C target -Blood sugar targets -Complications of uncontrolled diabetes  -Checking blood sugar before meals and bedtime and bring log next visit -All medications with mechanism of action and side effects -Hypoglycemia management: rule of 15's, Glucagon Emergency Kit and medical alert ID -low-carb low-fat plate-method diet -At least 20 minutes of physical activity per day -Annual dilated retinal eye exam and foot exam -compliance and follow up needs -follow up as scheduled or earlier if problem gets worse  Call if  blood sugar is less than 70 or consistently above 250    Take a 15 gm snack of carbohydrate at bedtime before you go to sleep if your blood sugar is less than 100.    If you are going to fast after midnight for a test or procedure, ask your physician for instructions on how to reduce/decrease your insulin dose.    Call if blood sugar is less than 70 or consistently above 250  -Treating a low sugar by rule of 15  (15 gms of sugar every 15 min until sugar is more than 70) If you feel your sugar is low, test your sugar to be sure If your sugar is low (less than 70), then take 15 grams of a fast acting Carbohydrate (3-4 glucose tablets or glucose gel or 4 ounces of juice or regular soda) Recheck your sugar 15 min after treating low to make sure it is more than 70 If sugar is still less than 70, treat again with 15 grams of carbohydrate          Don't drive the hour of hypoglycemia  If unconscious/unable to eat or drink by mouth, use glucagon injection or nasal spray baqsimi and call 911. Can repeat again in 15 min if still unconscious.  Return in about 3 months (around 03/14/2023).   I spent more than 50% of today's visit counseling patient on symptoms, examination findings, lab findings, imaging results, treatment decisions and monitoring and prognosis. The patient understood the recommendations and agrees with the treatment plan. All questions regarding treatment plan were fully answered  Altamese Cibolo, MD    History of Present Illness Amy Rosario is a 62 y.o. year old female who presents for follow up  for Type 2 diabetes mellitus.  Amy Rosario was first diagnosed in 2015.   Diabetes education +2  Home diabetes regimen: Tresiba 65 units bid Trulicity 4.5 mg weekly Metformin 1000 mg bid Farxiga 10mg  qd  Not taking Novolog   COMPLICATIONS -  MI/Stroke -  retinopathy, last eye exam 2022 +  neuropathy, last foot exam 2022-on gabapentin 100 mg tid +  nephropathy  BLOOD SUGAR  DATA Check 1-4/day, forgot log Per recall BG 90-147  Physical Exam  BP (!) 140/84   Pulse 98   Ht 5\' 10"  (1.778 m)   Wt (!) 322 lb 12.8 oz (146.4 kg)   LMP 11/09/2013   SpO2 98%   BMI 46.32 kg/m    Constitutional: well developed, well nourished Head: normocephalic, atraumatic Eyes: sclera anicteric, no redness Neck: supple Lungs: normal respiratory effort Neurology: alert and oriented Skin: dry, no appreciable rashes Musculoskeletal: no appreciable defects Psychiatric: normal mood and affect Diabetic Foot Exam - Simple   No data filed     Current Medications Patient's Medications  New Prescriptions   No medications on file  Previous Medications   ALLOPURINOL (ZYLOPRIM) 300 MG TABLET    TAKE 1 TABLET BY MOUTH EVERY DAY   AMLODIPINE (NORVASC) 10 MG TABLET    TAKE 1 TABLET BY MOUTH EVERY DAY   ASPIRIN 81 MG EC TABLET    TAKE 1 TABLET BY MOUTH EVERY DAY   BISOPROLOL (ZEBETA) 10 MG TABLET    TAKE 1 TABLET BY MOUTH EVERY DAY   BLOOD GLUCOSE MONITORING SUPPL (ONE TOUCH ULTRA 2) W/DEVICE KIT    1 Device by Does not apply route 2 (two) times daily.   CLOBETASOL OINTMENT (TEMOVATE) 0.05 %    Apply a pea sized amount to the vulva BID for up to 2 weeks as needed   CLONIDINE (CATAPRES) 0.1 MG TABLET    Take 0.1 mg by mouth 2 (two) times daily.   DAPAGLIFLOZIN PROPANEDIOL (FARXIGA) 10 MG TABS TABLET    Take 1 tablet (10 mg total) by mouth daily before breakfast.   GABAPENTIN (NEURONTIN) 100 MG CAPSULE    TAKE 1 CAPSULE BY MOUTH AT BEDTIME.   GLUCAGON HCL (GLUCAGON EMERGENCY) 1 MG/ML SOLR    Inject 1 mg as directed as needed (low blood sugar with impaired consciouness).   GLUCOSE BLOOD (ONETOUCH VERIO) TEST STRIP    CHECKS SUGARS 4 TIMES DAILY, ON MEAL TIME INSULIN   INSULIN DEGLUDEC (TRESIBA FLEXTOUCH) 200 UNIT/ML FLEXTOUCH PEN    Inject 130 Units into the skin daily. And pen needles 1/day   INSULIN PEN NEEDLE (BD PEN NEEDLE NANO 2ND GEN) 32G X 4 MM MISC    1 Needle by Does not apply  route daily at 6 (six) AM. Use as instructed to inject insulin 1/day   INSULIN PEN NEEDLE (PEN NEEDLES) 33G X 4 MM MISC    Use as instructed to inject insulin 1/day   LANCETS (ONETOUCH DELICA PLUS LANCET33G) MISC    USE TWICE A DAY   LEVOTHYROXINE (SYNTHROID) 50 MCG TABLET    Take 1 tablet (50 mcg total) by mouth daily.   MECLIZINE (ANTIVERT) 25 MG TABLET    TAKE 1 TABLET BY MOUTH TWICE A DAY   METFORMIN (GLUCOPHAGE) 1000 MG TABLET    Take 1 tablet (1,000 mg total) by mouth 2 (two) times daily with a meal.   MULTIPLE VITAMIN (MULTIVITAMIN) TABLET    Take 1 tablet by mouth daily.   NYSTATIN CREAM (MYCOSTATIN)  Apply 1 Application topically 2 (two) times daily. Apply to affected area (under breasts and stomach) BID for up to 7 days.   POTASSIUM CHLORIDE (KLOR-CON) 20 MEQ PACKET    Take 20 mEq by mouth 2 (two) times daily.   ROSUVASTATIN (CRESTOR) 20 MG TABLET    TAKE 1 TABLET BY MOUTH EVERY DAY   SPIRONOLACTONE (ALDACTONE) 25 MG TABLET    Take 25 mg by mouth daily.   VALSARTAN-HYDROCHLOROTHIAZIDE (DIOVAN-HCT) 320-25 MG TABLET    TAKE 1 TABLET BY MOUTH EVERY DAY  Modified Medications   Modified Medication Previous Medication   CHOLECALCIFEROL (D-1000 EXTRA STRENGTH) 25 MCG (1000 UT) TABLET D-1000 EXTRA STRENGTH 25 MCG (1000 UT) tablet      Take 1 tablet (1,000 Units total) by mouth daily.    TAKE 1 TABLET BY MOUTH EVERY DAY   DULAGLUTIDE (TRULICITY) 4.5 MG/0.5ML SOAJ Dulaglutide (TRULICITY) 4.5 MG/0.5ML SOAJ      Inject 4.5 mg as directed once a week.    Inject 4.5 mg as directed once a week.  Discontinued Medications   No medications on file    Allergies Allergies  Allergen Reactions   Penicillins Hives and Swelling    Has patient had a PCN reaction causing immediate rash, facial/tongue/throat swelling, SOB or lightheadedness with hypotension: Yes Has patient had a PCN reaction causing severe rash involving mucus membranes or skin necrosis: No Has patient had a PCN reaction that required  hospitalization: No Has patient had a PCN reaction occurring within the last 10 years: No If all of the above answers are "NO", then may proceed with Cephalosporin use.    Past Medical History Past Medical History:  Diagnosis Date   Chronic gout    followed by pcp---  multiple sites (06-11-2019 per pt last episode fall 2020)   H/O echocardiogram 04-04-2018  received via fax on 06-11-2019 to be scanned in   Dr. Rinaldo Cloud, [facility]-   Hyperlipidemia    Hypertension 2000   managed by Dr. Sharyn Lull, cardiology   Hypokalemia    Hypothyroidism    endocrinologist-- dr Everardo All-- s/p bx's 03-18-2018 and 07-02-2013 results in epic   OSA on CPAP    pt states has older machine is very large   Peripheral neuropathy    PMB (postmenopausal bleeding)    Sarcoidosis of lung [facility])    pulmonology-- dr wert---  s/p brochoscopy 07-05-2016, no sarcoid  (dr wert released pt as needed per lov note in epic 06-02-2018)   Thyroid goiter    Type 2 diabetes mellitus treated with insulin [facility])    endocrinology--- dr Everardo All   (06-11-2019  per pt checks blood surgar daily in am,  fasting surgar-- 85 -- 130)   Wears glasses     Past Surgical History Past Surgical History:  Procedure Laterality Date   COLONOSCOPY WITH PROPOFOL N/A 09/21/2013   Procedure: COLONOSCOPY WITH PROPOFOL;  Surgeon: Charna Elizabeth, MD;  Location: [facility];  Service: Endoscopy;  Laterality: N/A;   CYST EXCISION  teen   right lower back, benign   DILATATION & CURETTAGE/HYSTEROSCOPY WITH MYOSURE N/A 06/16/2019   Procedure: DILATATION & CURETTAGE/HYSTEROSCOPY WITH MYOSURE/POLYPECTOMY;  Surgeon: Romualdo Bolk, MD;  Location: [facility] ;  Service: Gynecology;  Laterality: N/A;  polypectomy   ENDOMETRIAL BIOPSY  2021   Dr. Gertie Exon   TUBAL LIGATION Bilateral yrs ago   VIDEO BRONCHOSCOPY Bilateral 07/05/2016   Procedure: VIDEO BRONCHOSCOPY WITH FLUORO;  Surgeon: Nyoka Cowden, MD;   Location:  [facility];  Service: Endoscopy;  Laterality: Bilateral;    Family History family history includes Heart disease in her brother and father; Hypertension in her father.  Social History Social History   Socioeconomic History   Marital status: Single    Spouse name: Not on file   Number of children: Not on file   Years of education: Not on file   Highest education level: Not on file  Occupational History   Not on file  Tobacco Use   Smoking status: Never   Smokeless tobacco: Never  Vaping Use   Vaping status: Never Used  Substance and Sexual Activity   Alcohol use: No   Drug use: Never   Sexual activity: Not on file  Other Topics Concern   Not on file  Social History Narrative   Lives with sister Aliene Beams, single, has 3 sons.   Doing some walking for exercise.  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  Awaiting disability determination.   06/2019.     Social Determinants of Health   Financial Resource Strain: Not on file  Food Insecurity: Not on file  Transportation Needs: Not on file  Physical Activity: Not on file  Stress: Not on file  Social Connections: Not on file  Intimate Partner Violence: Not on file    Lab Results  Component Value Date   HGBA1C 7.4 (A) 12/12/2022   Lab Results  Component Value Date   CHOL 134 05/31/2022   Lab Results  Component Value Date   HDL 40 (L) 05/31/2022   Lab Results  Component Value Date   LDLCALC 70 05/31/2022   Lab Results  Component Value Date   TRIG 153 (H) 05/31/2022   Lab Results  Component Value Date   CHOLHDL 3.4 05/31/2022   Lab Results  Component Value Date   CREATININE 1.21 (H) 06/04/2022   Lab Results  Component Value Date   MICROALBUR 1.3 06/04/2022     Parts of this note may have been dictated using voice recognition software. There may be variances in spelling and vocabulary which are unintentional. Not all errors are proofread. Please notify the  Thereasa Parkin if any discrepancies are noted or if the meaning of any statement is not clear.

## 2022-12-26 ENCOUNTER — Other Ambulatory Visit: Payer: Self-pay

## 2022-12-26 ENCOUNTER — Telehealth: Payer: Self-pay

## 2022-12-26 ENCOUNTER — Other Ambulatory Visit (HOSPITAL_COMMUNITY): Payer: Self-pay

## 2022-12-26 DIAGNOSIS — E039 Hypothyroidism, unspecified: Secondary | ICD-10-CM

## 2022-12-26 NOTE — Telephone Encounter (Signed)
Pharmacy Patient Advocate Encounter   Received notification from CoverMyMeds that prior authorization for Trulicity 4.5MG /0.5ML auto-injectors is required/requested.   Insurance verification completed.   The patient is insured through [facility] .   Per test claim: PA required; PA submitted to above mentioned insurance via CoverMyMeds Key/confirmation #/EOC [id number] Status is pending

## 2022-12-28 ENCOUNTER — Encounter: Payer: BC Managed Care – PPO | Attending: "Endocrinology | Admitting: Dietician

## 2022-12-28 ENCOUNTER — Encounter: Payer: Self-pay | Admitting: Dietician

## 2022-12-28 DIAGNOSIS — Z794 Long term (current) use of insulin: Secondary | ICD-10-CM | POA: Diagnosis present

## 2022-12-28 DIAGNOSIS — E1165 Type 2 diabetes mellitus with hyperglycemia: Secondary | ICD-10-CM | POA: Diagnosis present

## 2022-12-28 NOTE — Patient Instructions (Addendum)
Keep up the great work!  Drinking plenty of water  Exercising most days for 30 minutes  Continue to check your blood glucose daily  Continue to take your medications daily  Eat more vegetables (half your plate)  Avoid skipping meals  Continue a low sodium diet

## 2022-12-28 NOTE — Progress Notes (Signed)
Diabetes Self-Management Education  Visit Type: Follow-up  Appt. Start Time: 0905 Appt. End Time: 0935  12/28/2022  Amy Rosario, identified by name and date of birth, is a 62 y.o. female with a diagnosis of Diabetes:  .   ASSESSMENT Patient is here today with a friend.  She was last seen by this RD in 06/05/2022. A1C has dropped.  She has stopped lemonade and sweet tea.  She is drinking mostly water.   Patient is checking her blood glucose twice per day.  Blood glucose fasting 95, 112 last night and highest recently was 216 when she was out of her medication. Exercise:  does video exercises daily at home Going to [place] in December and states that she needs to exercise more to prepare.  History includes Type 2 diabetes dx 2015 and insulin since 2017, HLD, HTN, OSA- on cpap, sarcoidosis, goiter, gout A1C  7.4% 12/12/2022 decreased from 8.2% 09/07/2022, BUN 19, Creatinine 1.21, Potassium 3.9, GFR 49 on  06/04/2022 Medications include:  Farxiga, Trulicity, Tresiba 130 units q am, Metformin, synthroid, MVI, potassium, vitamin D   Weight hx: 320 lbs 12/28/2022 324 lbs 06/05/2022 325 lbs 03/12/2022 328 lbs 01/19/2022 318 lbs 01/18/2020  311 lbs 09/18/2019 316 lbs 08/24/2019 266 lbs 2018 291 lbs 08/02/2015 (lost by stopping soda) 325 lbs 2016    Patient lives with her sister and her sister's 71 yo son.  Her sister does the cooking now and patient will help.  She states that her sister told her to stop cooking when she got sick in 2018 as she would leave the stove on.   She has some memory issues which sister states have worsened. She is now on disability.  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 Avoids salt. Doesn't like raw vegetables as they hurt her teeth.  She states that she needs to go to the dentist and would like her teeth to be pulled. She was walking in the neighborhood until neighbor's pit bulls have been out. She does have [employer] but cannot  pay for the extra gas easily.  Receptive to exercise programs on you tube.    Diabetes Self-Management Education - 12/28/22 1149       Visit Information   Visit Type Follow-up      Psychosocial Assessment   What is the hardest part about your diabetes right now, causing you the most concern, or is the most worrisome to you about your diabetes?   Making healty food and beverage choices    Self-management support Doctor's office;CDE visits;Friends    Other persons present Patient;Friend    Patient Concerns Nutrition/Meal planning    Special Needs None    Preferred Learning Style No preference indicated    Learning Readiness Ready      Pre-Education Assessment   Patient understands the diabetes disease and treatment process. Comprehends key points    Patient understands incorporating nutritional management into lifestyle. Needs Review    Patient undertands incorporating physical activity into lifestyle. Comprehends key points    Patient understands using medications safely. Comprehends key points    Patient understands monitoring blood glucose, interpreting and using results Comprehends key points    Patient understands prevention, detection, and treatment of acute complications. Comprehends key points    Patient understands prevention, detection, and treatment of chronic complications. Compreheands key points    Patient understands how to develop strategies to address psychosocial issues. Comprehends key points    Patient understands how to  develop strategies to promote health/change behavior. Needs Review      Complications   Last HgB A1C per patient/outside source 7.4 %   12/12/2022   How often do you check your blood sugar? 3-4 times/day    Fasting Blood glucose range (mg/dL) 86-578    Postprandial Blood glucose range (mg/dL) 46-962;952-841      Dietary Intake   Breakfast skipped today or 3-4 boiled eggs    Snack (morning) none    Lunch skipped    Snack (afternoon) none     Dinner [place], dressing, strawberries    Snack (evening) vanilla wafers (6)    Beverage(s) water      Activity / Exercise   Activity / Exercise Type Light (walking / raking leaves)    How many days per week do you exercise? 7    How many minutes per day do you exercise? 30    Total minutes per week of exercise 210      Patient Education   Previous Diabetes Education Yes (please comment)   06/05/2022   Healthy Eating Meal options for control of blood glucose level and chronic complications.    Being Active Role of exercise on diabetes management, blood pressure control and cardiac health.    Medications Reviewed patients medication for diabetes, action, purpose, timing of dose and side effects.    Monitoring Taught/evaluated CGM (comment)    Diabetes Stress and Support Identified and addressed patients feelings and concerns about diabetes;Worked with patient to identify barriers to care and solutions      Individualized Goals (developed by patient)   Nutrition General guidelines for healthy choices and portions discussed    Physical Activity Exercise 5-7 days per week;30 minutes per day    Medications take my medication as prescribed    Monitoring  Test my blood glucose as discussed    Problem Solving Eating Pattern    Reducing Risk examine blood glucose patterns;do foot checks daily;treat hypoglycemia with 15 grams of carbs if blood glucose less than 70mg /dL      Patient Self-Evaluation of Goals - Patient rates self as meeting previously set goals (% of time)   Nutrition 50 - 75 % (half of the time)    Physical Activity >75% (most of the time)    Medications >75% (most of the time)    Monitoring >75% (most of the time)    Problem Solving and behavior change strategies  >75% (most of the time)    Reducing Risk (treating acute and chronic complications) >75% (most of the time)    Health Coping >75% (most of the time)      Post-Education Assessment   Patient understands the diabetes  disease and treatment process. Demonstrates understanding / competency    Patient understands incorporating nutritional management into lifestyle. Needs Review    Patient undertands incorporating physical activity into lifestyle. Demonstrates understanding / competency    Patient understands using medications safely. Demonstrates understanding / competency    Patient understands monitoring blood glucose, interpreting and using results Demonstrates understanding / competency    Patient understands prevention, detection, and treatment of acute complications. Demonstrates understanding / competency    Patient understands prevention, detection, and treatment of chronic complications. Demonstrates understanding / competency    Patient understands how to develop strategies to address psychosocial issues. Demonstrates understanding / competency    Patient understands how to develop strategies to promote health/change behavior. Comprehends key points      Outcomes   Expected Outcomes Demonstrated  interest in learning. Expect positive outcomes    Future DMSE 3-4 months    Program Status Not Completed      Subsequent Visit   Since your last visit have you experienced any weight changes? Loss    Weight Loss (lbs) 4             Individualized Plan for Diabetes Self-Management Training:   Learning Objective:  Patient will have a greater understanding of diabetes self-management. Patient education plan is to attend individual and/or group sessions per assessed needs and concerns.   Plan:   Patient Instructions  Keep up the great work!  Drinking plenty of water  Exercising most days for 30 minutes  Continue to check your blood glucose daily  Continue to take your medications daily  Eat more vegetables (half your plate)  Avoid skipping meals  Continue a low sodium diet  Expected Outcomes:  Demonstrated interest in learning. Expect positive outcomes  Education material provided:   If  problems or questions, patient to contact team via:  Phone  Future DSME appointment: 3-4 months

## 2022-12-31 ENCOUNTER — Other Ambulatory Visit: Payer: Self-pay | Admitting: "Endocrinology

## 2022-12-31 DIAGNOSIS — E1165 Type 2 diabetes mellitus with hyperglycemia: Secondary | ICD-10-CM

## 2022-12-31 NOTE — Telephone Encounter (Signed)
Pharmacy Patient Advocate Encounter  Received notification from [facility] that Prior Authorization for Trulicity has been APPROVED from 12/25/22 to 12/24/2025   PA #/Case ID/Reference #: [id number]

## 2023-01-25 ENCOUNTER — Other Ambulatory Visit: Payer: Self-pay | Admitting: "Endocrinology

## 2023-01-25 ENCOUNTER — Other Ambulatory Visit: Payer: Self-pay | Admitting: Medical

## 2023-01-25 DIAGNOSIS — Z794 Long term (current) use of insulin: Secondary | ICD-10-CM

## 2023-02-12 ENCOUNTER — Other Ambulatory Visit: Payer: Self-pay | Admitting: "Endocrinology

## 2023-02-12 DIAGNOSIS — Z794 Long term (current) use of insulin: Secondary | ICD-10-CM

## 2023-02-12 DIAGNOSIS — E1165 Type 2 diabetes mellitus with hyperglycemia: Secondary | ICD-10-CM

## 2023-02-21 ENCOUNTER — Other Ambulatory Visit: Payer: Self-pay | Admitting: "Endocrinology

## 2023-02-21 ENCOUNTER — Other Ambulatory Visit: Payer: Self-pay | Admitting: Medical

## 2023-02-21 DIAGNOSIS — Z794 Long term (current) use of insulin: Secondary | ICD-10-CM

## 2023-02-21 NOTE — Telephone Encounter (Signed)
Looks like pt does not see [place] anymore

## 2023-02-27 LAB — LAB REPORT - SCANNED
A1c: 7.9
Calcium: 9.6
EGFR: 40
TSH: 3.31 (ref 0.41–5.90)

## 2023-03-04 ENCOUNTER — Other Ambulatory Visit: Payer: Self-pay | Admitting: Family Medicine

## 2023-03-04 DIAGNOSIS — Z1231 Encounter for screening mammogram for malignant neoplasm of breast: Secondary | ICD-10-CM

## 2023-03-13 ENCOUNTER — Ambulatory Visit (INDEPENDENT_AMBULATORY_CARE_PROVIDER_SITE_OTHER): Payer: 59 | Admitting: "Endocrinology

## 2023-03-13 ENCOUNTER — Other Ambulatory Visit: Payer: Self-pay

## 2023-03-13 ENCOUNTER — Encounter: Payer: Self-pay | Admitting: "Endocrinology

## 2023-03-13 VITALS — BP 124/80 | HR 82 | Resp 20 | Ht 70.0 in | Wt 321.4 lb

## 2023-03-13 DIAGNOSIS — Z7984 Long term (current) use of oral hypoglycemic drugs: Secondary | ICD-10-CM | POA: Diagnosis not present

## 2023-03-13 DIAGNOSIS — E1165 Type 2 diabetes mellitus with hyperglycemia: Secondary | ICD-10-CM

## 2023-03-13 DIAGNOSIS — E1142 Type 2 diabetes mellitus with diabetic polyneuropathy: Secondary | ICD-10-CM | POA: Diagnosis not present

## 2023-03-13 DIAGNOSIS — Z7985 Long-term (current) use of injectable non-insulin antidiabetic drugs: Secondary | ICD-10-CM

## 2023-03-13 DIAGNOSIS — Z794 Long term (current) use of insulin: Secondary | ICD-10-CM

## 2023-03-13 DIAGNOSIS — G629 Polyneuropathy, unspecified: Secondary | ICD-10-CM

## 2023-03-13 DIAGNOSIS — E7800 Pure hypercholesterolemia, unspecified: Secondary | ICD-10-CM

## 2023-03-13 LAB — POCT GLYCOSYLATED HEMOGLOBIN (HGB A1C): Hemoglobin A1C: 7.6 % — AB (ref 4.0–5.6)

## 2023-03-13 MED ORDER — TRULICITY 4.5 MG/0.5ML ~~LOC~~ SOAJ: SUBCUTANEOUS | 1 refills | Status: AC

## 2023-03-13 MED ORDER — D-1000 EXTRA STRENGTH 25 MCG (1000 UT) PO TABS: 1000.0000 [IU] | ORAL_TABLET | Freq: Every day | ORAL | 1 refills | Status: DC

## 2023-03-13 MED ORDER — TRULICITY 4.5 MG/0.5ML ~~LOC~~ SOAJ: SUBCUTANEOUS | 1 refills | Status: DC

## 2023-03-13 NOTE — Progress Notes (Addendum)
 Outpatient Endocrinology Note  Amy Rosario 12-01-1960 [id number]   Referring Provider: Roanna Ezekiel NOVAK, MD Primary Care Provider: Roanna Ezekiel NOVAK, MD Reason for consultation: Subjective  Type 2 diabetes mellitus  Assessment & Plan  Diagnoses and all orders for this visit:  Uncontrolled type 2 diabetes mellitus with hyperglycemia (HCC) -     POCT glycosylated hemoglobin (Hb A1C) -     Dulaglutide  (TRULICITY ) 4.5 MG/0.5ML SOAJ; INJECT 4.5 MG UNDER THE SKIN ONCE A WEEK AS DIRECTED  Neuropathy  Long term (current) use of oral hypoglycemic drugs  Long-term (current) use of injectable non-insulin  antidiabetic drugs  Long-term insulin  use (HCC)  Pure hypercholesterolemia    Diabetes complicated by neuropathy, nephropathy with low GFR Hba1c goal less than 7, current Hba1c is 7.9. Will recommend for the following change of medications to: Tresiba  67 units bid Trulicity  4.5 mg weekly Metformin  1000 mg every day (GFR 40) Farxiga  10 mg every day (if UTI occurs - hold it and cut it to 5 mg every day)  No known contraindications to any of above medications Glucagon  ordered 06/04/22 Doesn't want CGM  Hyperlipidemia 02/26/23: LDL 85, Tg 138, Chol 149, HDL 40 -Last LDL at goal: 70 -on rosuvastatin  40 mg every day since 02/2023 -Follow low fat diet and exercise   -Blood pressure - Microalbumin/creatinine goal < 30 -On ACE/ARB Valsartan  320 mg qd -BP off goal: missed BP meds today, take meds, check BP at home and contact PCP -diet changes including salt restriction -limit eating outside -counseled BP targets per standards of diabetes care -Uncontrolled blood pressure can lead to retinopathy, nephropathy and cardiovascular and atherosclerotic heart disease  Reviewed and counseled on: -A1C target -Blood sugar targets -Complications of uncontrolled diabetes  -Checking blood sugar before meals and bedtime and bring log next visit -All medications with mechanism  of action and side effects -Hypoglycemia management: rule of 15's, Glucagon  Emergency Kit and medical alert ID -low-carb low-fat plate-method diet -At least 20 minutes of physical activity per day -Annual dilated retinal eye exam and foot exam -compliance and follow up needs -follow up as scheduled or earlier if problem gets worse  Call if blood sugar is less than 70 or consistently above 250    Take a 15 gm snack of carbohydrate at bedtime before you go to sleep if your blood sugar is less than 100.    If you are going to fast after midnight for a test or procedure, ask your physician for instructions on how to reduce/decrease your insulin  dose.    Call if blood sugar is less than 70 or consistently above 250  -Treating a low sugar by rule of 15  (15 gms of sugar every 15 min until sugar is more than 70) If you feel your sugar is low, test your sugar to be sure If your sugar is low (less than 70), then take 15 grams of a fast acting Carbohydrate (3-4 glucose tablets or glucose gel or 4 ounces of juice or regular soda) Recheck your sugar 15 min after treating low to make sure it is more than 70 If sugar is still less than 70, treat again with 15 grams of carbohydrate          Don't drive the hour of hypoglycemia  If unconscious/unable to eat or drink by mouth, use glucagon  injection or nasal spray baqsimi  and call 911. Can repeat again in 15 min if still unconscious.  Return in about 3 months (around 06/10/2023).  I spent more than 50% of today's visit counseling patient on symptoms, examination findings, lab findings, imaging results, treatment decisions and monitoring and prognosis. The patient understood the recommendations and agrees with the treatment plan. All questions regarding treatment plan were fully answered  Amy Birmingham, MD    History of Present Illness Amy Rosario is a 63 y.o. year old female who presents for follow up for Type 2 diabetes mellitus.  Amy Rosario was first diagnosed in 2015.   Diabetes education +2  Home diabetes regimen: Tresiba  65 units bid Trulicity  4.5 mg weekly Metformin  1000 mg bid Farxiga  10mg  qd  Not taking Novolog    COMPLICATIONS -  MI/Stroke -  retinopathy, last eye exam 2022 +  neuropathy, last foot exam 2022-on gabapentin  100 mg tid +  nephropathy  BLOOD SUGAR DATA Checks 1-4 times a day Range: 114-209   Physical Exam  BP 124/80 (BP Location: Left Arm, Patient Position: Sitting, Cuff Size: Large)   Pulse 82   Resp 20   Ht 5' 10 (1.778 m)   Wt (!) 321 lb 6.4 oz (145.8 kg)   LMP 11/09/2013   SpO2 95%   BMI 46.12 kg/m    Constitutional: well developed, well nourished Head: normocephalic, atraumatic Eyes: sclera anicteric, no redness Neck: supple Lungs: normal respiratory effort Neurology: alert and oriented Skin: dry, no appreciable rashes Musculoskeletal: no appreciable defects Psychiatric: normal mood and affect Diabetic Foot Exam - Simple   No data filed     Current Medications Patient's Medications  New Prescriptions   No medications on file  Previous Medications   ALLOPURINOL  (ZYLOPRIM ) 300 MG TABLET    TAKE 1 TABLET BY MOUTH EVERY DAY   AMLODIPINE  (NORVASC ) 10 MG TABLET    TAKE 1 TABLET BY MOUTH EVERY DAY   ASPIRIN  81 MG EC TABLET    TAKE 1 TABLET BY MOUTH EVERY DAY   BISOPROLOL  (ZEBETA ) 10 MG TABLET    TAKE 1 TABLET BY MOUTH EVERY DAY   BLOOD GLUCOSE MONITORING SUPPL (ONE TOUCH ULTRA 2) W/DEVICE KIT    1 Device by Does not apply route 2 (two) times daily.   CLOBETASOL  OINTMENT (TEMOVATE ) 0.05 %    Apply a pea sized amount to the vulva BID for up to 2 weeks as needed   CLONIDINE  (CATAPRES ) 0.1 MG TABLET    Take 0.1 mg by mouth 2 (two) times daily.   DAPAGLIFLOZIN  PROPANEDIOL (FARXIGA ) 10 MG TABS TABLET    TAKE 1 TABLET DAILY BEFORE BREAKFAST   GABAPENTIN  (NEURONTIN ) 100 MG CAPSULE    TAKE 1 CAPSULE BY MOUTH AT BEDTIME.   GLUCAGON  HCL (GLUCAGON  EMERGENCY) 1 MG/ML SOLR    Inject  1 mg as directed as needed (low blood sugar with impaired consciouness).   GLUCOSE BLOOD (ONETOUCH VERIO) TEST STRIP    CHECKS SUGARS 4 TIMES DAILY, ON MEAL TIME INSULIN    INSULIN  DEGLUDEC (TRESIBA  FLEXTOUCH) 200 UNIT/ML FLEXTOUCH PEN    Inject 130 Units into the skin daily. And pen needles 1/day   INSULIN  PEN NEEDLE (BD PEN NEEDLE NANO 2ND GEN) 32G X 4 MM MISC    1 Needle by Does not apply route daily at 6 (six) AM. Use as instructed to inject insulin  1/day   INSULIN  PEN NEEDLE (PEN NEEDLES) 33G X 4 MM MISC    Use as instructed to inject insulin  1/day   LANCETS (ONETOUCH DELICA PLUS LANCET33G) MISC    USE TWICE A DAY   LEVOTHYROXINE  (SYNTHROID ) 50 MCG  TABLET    Take 1 tablet (50 mcg total) by mouth daily.   MECLIZINE  (ANTIVERT ) 25 MG TABLET    TAKE 1 TABLET BY MOUTH TWICE A DAY   METFORMIN  (GLUCOPHAGE ) 1000 MG TABLET    Take 1 tablet (1,000 mg total) by mouth 2 (two) times daily with a meal.   MULTIPLE VITAMIN (MULTIVITAMIN) TABLET    Take 1 tablet by mouth daily.   NYSTATIN  CREAM (MYCOSTATIN )    Apply 1 Application topically 2 (two) times daily. Apply to affected area (under breasts and stomach) BID for up to 7 days.   POTASSIUM CHLORIDE  (KLOR-CON ) 20 MEQ PACKET    Take 20 mEq by mouth 2 (two) times daily.   ROSUVASTATIN  (CRESTOR ) 20 MG TABLET    TAKE 1 TABLET BY MOUTH EVERY DAY   SPIRONOLACTONE (ALDACTONE) 25 MG TABLET    Take 25 mg by mouth daily.   VALSARTAN -HYDROCHLOROTHIAZIDE  (DIOVAN -HCT) 320-25 MG TABLET    TAKE 1 TABLET BY MOUTH EVERY DAY  Modified Medications   Modified Medication Previous Medication   CHOLECALCIFEROL (D-1000 EXTRA STRENGTH) 25 MCG (1000 UT) TABLET Cholecalciferol (D-1000 EXTRA STRENGTH) 25 MCG (1000 UT) tablet      Take 1 tablet (1,000 Units total) by mouth daily.    Take 1 tablet (1,000 Units total) by mouth daily.   DULAGLUTIDE  (TRULICITY ) 4.5 MG/0.5ML SOAJ Dulaglutide  (TRULICITY ) 4.5 MG/0.5ML SOAJ      INJECT 4.5 MG UNDER THE SKIN ONCE A WEEK AS DIRECTED    INJECT  4.5 MG UNDER THE SKIN ONCE A WEEK AS DIRECTED  Discontinued Medications   No medications on file    Allergies Allergies  Allergen Reactions   Penicillins Hives and Swelling    Has patient had a PCN reaction causing immediate rash, facial/tongue/throat swelling, SOB or lightheadedness with hypotension: Yes Has patient had a PCN reaction causing severe rash involving mucus membranes or skin necrosis: No Has patient had a PCN reaction that required hospitalization: No Has patient had a PCN reaction occurring within the last 10 years: No If all of the above answers are NO, then may proceed with Cephalosporin use.    Past Medical History Past Medical History:  Diagnosis Date   Chronic gout    followed by pcp---  multiple sites (06-11-2019 per pt last episode fall 2020)   H/O echocardiogram 04-04-2018  received via fax on 06-11-2019 to be scanned in   Dr. Rober Chroman, [facility]-   Hyperlipidemia    Hypertension 2000   managed by Dr. Chroman, cardiology   Hypokalemia    Hypothyroidism    endocrinologist-- dr kassie-- s/p bx's 03-18-2018 and 07-02-2013 results in epic   OSA on CPAP    pt states has older machine is very large   Peripheral neuropathy    PMB (postmenopausal bleeding)    Sarcoidosis of lung [facility])    pulmonology-- dr wert---  s/p brochoscopy 07-05-2016, no sarcoid  (dr wert released pt as needed per lov note in epic 06-02-2018)   Thyroid  goiter    Type 2 diabetes mellitus treated with insulin  [facility])    endocrinology--- dr kassie   (06-11-2019  per pt checks blood surgar daily in am,  fasting surgar-- 85 -- 130)   Wears glasses     Past Surgical History Past Surgical History:  Procedure Laterality Date   COLONOSCOPY WITH PROPOFOL  N/A 09/21/2013   Procedure: COLONOSCOPY WITH PROPOFOL ;  Surgeon: Renaye Sous, MD;  Location: [facility];  Service: Endoscopy;  Laterality: N/A;  CYST EXCISION  teen   right lower back, benign   DILATATION &  CURETTAGE/HYSTEROSCOPY WITH MYOSURE N/A 06/16/2019   Procedure: DILATATION & CURETTAGE/HYSTEROSCOPY WITH MYOSURE/POLYPECTOMY;  Surgeon: Jannis Kate Norris, MD;  Location: [facility];  Service: Gynecology;  Laterality: N/A;  polypectomy   ENDOMETRIAL BIOPSY  2021   Dr. Kate Jannis   TUBAL LIGATION Bilateral yrs ago   VIDEO BRONCHOSCOPY Bilateral 07/05/2016   Procedure: VIDEO BRONCHOSCOPY WITH FLUORO;  Surgeon: Darlean Ozell NOVAK, MD;  Location: [facility];  Service: Endoscopy;  Laterality: Bilateral;    Family History family history includes Heart disease in her brother and father; Hypertension in her father.  Social History Social History   Socioeconomic History   Marital status: Single    Spouse name: Not on file   Number of children: Not on file   Years of education: Not on file   Highest education level: Not on file  Occupational History   Not on file  Tobacco Use   Smoking status: Never   Smokeless tobacco: Never  Vaping Use   Vaping status: Never Used  Substance and Sexual Activity   Alcohol use: No   Drug use: Never   Sexual activity: Not on file  Other Topics Concern   Not on file  Social History Narrative   Lives with sister Harper Gavel, single, has 3 sons.   Doing some walking for exercise.  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  Awaiting disability determination.   06/2019.     Social Drivers of Corporate Investment Banker Strain: Not on file  Food Insecurity: Not on file  Transportation Needs: Not on file  Physical Activity: Not on file  Stress: Not on file  Social Connections: Not on file  Intimate Partner Violence: Not on file    Lab Results  Component Value Date   HGBA1C 7.6 (A) 03/13/2023   Lab Results  Component Value Date   CHOL 134 05/31/2022   Lab Results  Component Value Date   HDL 40 (L) 05/31/2022   Lab Results  Component Value Date   LDLCALC 70 05/31/2022   Lab Results  Component  Value Date   TRIG 153 (H) 05/31/2022   Lab Results  Component Value Date   CHOLHDL 3.4 05/31/2022   Lab Results  Component Value Date   CREATININE 1.21 (H) 06/04/2022   Lab Results  Component Value Date   MICROALBUR 1.3 06/04/2022     Parts of this note may have been dictated using voice recognition software. There may be variances in spelling and vocabulary which are unintentional. Not all errors are proofread. Please notify the dino if any discrepancies are noted or if the meaning of any statement is not clear.

## 2023-03-14 ENCOUNTER — Ambulatory Visit: Payer: Managed Care, Other (non HMO) | Admitting: "Endocrinology

## 2023-04-11 ENCOUNTER — Encounter: Payer: Self-pay | Admitting: Dietician

## 2023-04-11 ENCOUNTER — Encounter: Payer: BC Managed Care – PPO | Attending: Internal Medicine | Admitting: Dietician

## 2023-04-11 DIAGNOSIS — Z794 Long term (current) use of insulin: Secondary | ICD-10-CM | POA: Diagnosis present

## 2023-04-11 DIAGNOSIS — E1165 Type 2 diabetes mellitus with hyperglycemia: Secondary | ICD-10-CM | POA: Diagnosis present

## 2023-04-11 NOTE — Progress Notes (Signed)
 Diabetes Self-Management Education  Visit Type: Follow-up  Appt. Start Time: 0910 Appt. End Time: 0945  04/11/2023  Amy Rosario, identified by name and date of birth, is a 63 y.o. female with a diagnosis of Diabetes:  .   ASSESSMENT Patient is here today alone.  She was last seen by this RD on 12/28/2022.  She went on a Cruise in December.  She states that she did increased amounts of walking.  She states that she has not been as active since.  She has exercised with a friend and he motivates her.  She uses light weights. She continues to avoid sweetened beverages. Continues to check her blood glucose 2-3 times per day.  Fasting 121, 140 after dinner She ran out of the [place] for about 3 weeks in January due to pharmacy issues.  History includes Type 2 diabetes dx 2015 and insulin since 2017, HLD, HTN, OSA- on cpap, sarcoidosis, goiter, gout A1C  7.6% 03/13/2023 and 7.4% 12/12/2022 decreased from 8.2% 09/07/2022, BUN 19, Creatinine 1.21, Potassium 3.9, GFR 49 on  06/04/2022 Medications include:  Farxiga, Trulicity, Tresiba 67 units bid, Metformin, synthroid, MVI, potassium, vitamin D   Weight hx: 325 lbs 04/11/2023 320 lbs 12/28/2022 324 lbs 06/05/2022 325 lbs 03/12/2022 328 lbs 01/19/2022 318 lbs 01/18/2020  311 lbs 09/18/2019 316 lbs 08/24/2019 266 lbs 2018 291 lbs 08/02/2015 (lost by stopping soda) 325 lbs 2016    Patient lives with her sister and her sister's 27 yo son.  Her sister does the cooking now and patient will help.  She states that her sister told her to stop cooking when she got sick in 2018 as she would leave the stove on.   She has some memory issues which sister states have worsened. She is now on disability.  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 Avoids salt. Doesn't like raw vegetables as they hurt her teeth.  She states that she needs to go to the dentist and would like her teeth to be pulled. She was walking in the neighborhood until  neighbor's pit bulls have been out. She does have [employer] but cannot pay for the extra gas easily.  Receptive to exercise programs on you tube.  Last menstrual period 11/09/2013. There is no height or weight on file to calculate BMI.   Diabetes Self-Management Education - 04/11/23 0948       Visit Information   Visit Type Follow-up      Psychosocial Assessment   Patient Belief/Attitude about Diabetes Motivated to manage diabetes    What is the hardest part about your diabetes right now, causing you the most concern, or is the most worrisome to you about your diabetes?   Making healty food and beverage choices    Self-care barriers Debilitated state due to current medical condition    Self-management support Doctor's office;CDE visits    Other persons present Patient    Patient Concerns Nutrition/Meal planning    Special Needs None    Preferred Learning Style No preference indicated    Learning Readiness Ready    How often do you need to have someone help you when you read instructions, pamphlets, or other written materials from your doctor or pharmacy? 1 - Never      Pre-Education Assessment   Patient understands the diabetes disease and treatment process. Comprehends key points    Patient understands incorporating nutritional management into lifestyle. Needs Review    Patient undertands incorporating physical activity into lifestyle.  Comprehends key points    Patient understands using medications safely. Comprehends key points    Patient understands monitoring blood glucose, interpreting and using results Comprehends key points    Patient understands prevention, detection, and treatment of acute complications. Comprehends key points    Patient understands prevention, detection, and treatment of chronic complications. Compreheands key points    Patient understands how to develop strategies to address psychosocial issues. Comprehends key points    Patient understands how to  develop strategies to promote health/change behavior. Needs Review      Complications   Last HgB A1C per patient/outside source 7.6 %   03/13/2023 increased from 7.4% on 12/12/2022   How often do you check your blood sugar? 3-4 times/day    Fasting Blood glucose range (mg/dL) 40-981    Postprandial Blood glucose range (mg/dL) 191-478      Dietary Intake   Breakfast scrambled eggs    Lunch bologna sandwich on white with mayo    Snack (afternoon) graham crackers with PB    Dinner Fried [employer], mashed potatoes    Beverage(s) water      Activity / Exercise   Activity / Exercise Type ADL's      Patient Education   Previous Diabetes Education Yes (please comment)   12/2022   Healthy Eating Meal options for control of blood glucose level and chronic complications.    Being Active Role of exercise on diabetes management, blood pressure control and cardiac health.;Helped patient identify appropriate exercises in relation to his/her diabetes, diabetes complications and other health issue.    Medications Reviewed patients medication for diabetes, action, purpose, timing of dose and side effects.    Monitoring Taught/evaluated CGM (comment)    Diabetes Stress and Support Identified and addressed patients feelings and concerns about diabetes;Worked with patient to identify barriers to care and solutions      Individualized Goals (developed by patient)   Nutrition General guidelines for healthy choices and portions discussed    Physical Activity Exercise 5-7 days per week;30 minutes per day    Medications take my medication as prescribed    Monitoring  Test my blood glucose as discussed    Problem Solving Eating Pattern;Addressing barriers to behavior change    Reducing Risk examine blood glucose patterns;treat hypoglycemia with 15 grams of carbs if blood glucose less than 70mg /dL;do foot checks daily      Patient Self-Evaluation of Goals - Patient rates self as meeting previously set goals (% of time)    Nutrition 50 - 75 % (half of the time)    Physical Activity 25 - 50% (sometimes)    Medications >75% (most of the time)    Monitoring >75% (most of the time)    Problem Solving and behavior change strategies  >75% (most of the time)    Reducing Risk (treating acute and chronic complications) >75% (most of the time)    Health Coping >75% (most of the time)      Post-Education Assessment   Patient understands the diabetes disease and treatment process. Demonstrates understanding / competency    Patient understands incorporating nutritional management into lifestyle. Needs Review    Patient undertands incorporating physical activity into lifestyle. Demonstrates understanding / competency    Patient understands using medications safely. Demonstrates understanding / competency    Patient understands monitoring blood glucose, interpreting and using results Demonstrates understanding / competency    Patient understands prevention, detection, and treatment of acute complications. Demonstrates understanding / competency  Patient understands prevention, detection, and treatment of chronic complications. Demonstrates understanding / competency    Patient understands how to develop strategies to address psychosocial issues. Demonstrates understanding / competency    Patient understands how to develop strategies to promote health/change behavior. Comprehends key points      Outcomes   Expected Outcomes Demonstrated interest in learning. Expect positive outcomes    Future DMSE 3-4 months    Program Status Not Completed      Subsequent Visit   Since your last visit have you continued or begun to take your medications as prescribed? Yes    Since your last visit have you had your blood pressure checked? Yes    Since your last visit have you experienced any weight changes? Gain    Weight Gain (lbs) 5    Since your last visit, are you checking your blood glucose at least once a day? Yes              Individualized Plan for Diabetes Self-Management Training:   Learning Objective:  Patient will have a greater understanding of diabetes self-management. Patient education plan is to attend individual and/or group sessions per assessed needs and concerns.   Plan:   Patient Instructions  Keep up the great work!             Drinking plenty of water             Exercising most days for 30 minutes - you tube and other             Continue to check your blood glucose daily             Continue to take your medications daily             Eat more vegetables (half your plate), fresh fruit (5 servings vegetables and fruit daily)             Avoid skipping meals             Continue a low sodium diet  Expected Outcomes:  Demonstrated interest in learning. Expect positive outcomes  Education material provided:   If problems or questions, patient to contact team via:  Phone  Future DSME appointment: 3-4 months

## 2023-04-11 NOTE — Patient Instructions (Addendum)
 Keep up the great work!             Drinking plenty of water             Exercising most days for 30 minutes - you tube and other             Continue to check your blood glucose daily             Continue to take your medications daily             Eat more vegetables (half your plate), fresh fruit (5 servings vegetables and fruit daily)             Avoid skipping meals             Continue a low sodium diet

## 2023-05-28 ENCOUNTER — Other Ambulatory Visit: Payer: Self-pay | Admitting: "Endocrinology

## 2023-05-28 DIAGNOSIS — Z794 Long term (current) use of insulin: Secondary | ICD-10-CM

## 2023-07-03 ENCOUNTER — Encounter: Payer: Self-pay | Admitting: "Endocrinology

## 2023-07-03 ENCOUNTER — Ambulatory Visit: Payer: 59 | Admitting: "Endocrinology

## 2023-07-03 VITALS — BP 124/80 | HR 85 | Ht 70.0 in | Wt 326.0 lb

## 2023-07-03 DIAGNOSIS — Z7984 Long term (current) use of oral hypoglycemic drugs: Secondary | ICD-10-CM

## 2023-07-03 DIAGNOSIS — Z794 Long term (current) use of insulin: Secondary | ICD-10-CM

## 2023-07-03 DIAGNOSIS — E1165 Type 2 diabetes mellitus with hyperglycemia: Secondary | ICD-10-CM | POA: Diagnosis not present

## 2023-07-03 DIAGNOSIS — G629 Polyneuropathy, unspecified: Secondary | ICD-10-CM | POA: Diagnosis not present

## 2023-07-03 DIAGNOSIS — E7800 Pure hypercholesterolemia, unspecified: Secondary | ICD-10-CM

## 2023-07-03 DIAGNOSIS — Z7985 Long-term (current) use of injectable non-insulin antidiabetic drugs: Secondary | ICD-10-CM | POA: Diagnosis not present

## 2023-07-03 LAB — POCT GLYCOSYLATED HEMOGLOBIN (HGB A1C): Hemoglobin A1C: 8.1 % — AB (ref 4.0–5.6)

## 2023-07-03 NOTE — Patient Instructions (Signed)

## 2023-07-03 NOTE — Progress Notes (Signed)
 Outpatient Endocrinology Note  Amy Rosario 10-21-1960 [id number]   Referring Provider: Nohemi Batters, MD Primary Care Provider: Nohemi Batters, MD Reason for consultation: Subjective  Type 2 diabetes mellitus  Assessment & Plan  Diagnoses and all orders for this visit:  Uncontrolled type 2 diabetes mellitus with hyperglycemia (HCC) -     POCT glycosylated hemoglobin (Hb A1C) -     Comprehensive metabolic panel with GFR -     Lipid panel -     Microalbumin / creatinine urine ratio  Neuropathy  Long term (current) use of oral hypoglycemic drugs  Long-term (current) use of injectable non-insulin  antidiabetic drugs  Long-term insulin  use (HCC)  Pure hypercholesterolemia    Diabetes complicated by neuropathy, nephropathy with low GFR Hba1c goal less than 7, current Hba1c is 8.1. Will recommend for the following change of medications to: Tresiba  67 units bid Trulicity  4.5 mg weekly Metformin  1000 mg every day (GFR 40) Farxiga  10 mg every day (if UTI occurs - hold it and cut it to 5 mg every day) 07/03/23 Patient is planning to see diabetes educator, instructed patient to against sweet tea/juice and portion control in diet, patient is not interested in starting short acting meal time insulin  as recommended  No known contraindications to any of above medications Glucagon  ordered 06/04/22 Doesn't want CGM  Hyperlipidemia 02/26/23: LDL 85, Tg 138, Chol 149, HDL 40 -Last LDL at goal: 70 -on rosuvastatin  40 mg every day since 02/2023 -Follow low fat diet and exercise   -Blood pressure - Microalbumin/creatinine goal < 30 -On ACE/ARB Valsartan  320 mg qd -BP off goal: missed BP meds today, take meds, check BP at home and contact PCP -diet changes including salt restriction -limit eating outside -counseled BP targets per standards of diabetes care -Uncontrolled blood pressure can lead to retinopathy, nephropathy and cardiovascular and atherosclerotic heart  disease  Reviewed and counseled on: -A1C target -Blood sugar targets -Complications of uncontrolled diabetes  -Checking blood sugar before meals and bedtime and bring log next visit -All medications with mechanism of action and side effects -Hypoglycemia management: rule of 15's, Glucagon  Emergency Kit and medical alert ID -low-carb low-fat plate-method diet -At least 20 minutes of physical activity per day -Annual dilated retinal eye exam and foot exam -compliance and follow up needs -follow up as scheduled or earlier if problem gets worse  Call if blood sugar is less than 70 or consistently above 250    Take a 15 gm snack of carbohydrate at bedtime before you go to sleep if your blood sugar is less than 100.    If you are going to fast after midnight for a test or procedure, ask your physician for instructions on how to reduce/decrease your insulin  dose.    Call if blood sugar is less than 70 or consistently above 250  -Treating a low sugar by rule of 15  (15 gms of sugar every 15 min until sugar is more than 70) If you feel your sugar is low, test your sugar to be sure If your sugar is low (less than 70), then take 15 grams of a fast acting Carbohydrate (3-4 glucose tablets or glucose gel or 4 ounces of juice or regular soda) Recheck your sugar 15 min after treating low to make sure it is more than 70 If sugar is still less than 70, treat again with 15 grams of carbohydrate          Don't drive the hour of hypoglycemia  If unconscious/unable to eat or drink by mouth, use glucagon  injection or nasal spray baqsimi  and call 911. Can repeat again in 15 min if still unconscious.  Return in about 2 months (around 09/02/2023) for visit and 8 am labs before next visit.   I spent more than 50% of today's visit counseling patient on symptoms, examination findings, lab findings, imaging results, treatment decisions and monitoring and prognosis. The patient understood the recommendations and  agrees with the treatment plan. All questions regarding treatment plan were fully answered  Jorge Newcomer, MD    History of Present Illness Amy Rosario is a 63 y.o. year old female who presents for follow up for Type 2 diabetes mellitus.  Amy Rosario was first diagnosed in 2015.   Diabetes education +2  Home diabetes regimen: Tresiba  67 units bid Trulicity  4.5 mg weekly Metformin  1000 mg qd Farxiga  10mg  qd  Not taking Novolog    COMPLICATIONS -  MI/Stroke -  retinopathy, last eye exam 2022 +  neuropathy, last foot exam 2022-on gabapentin  100 mg tid +  nephropathy  BLOOD SUGAR DATA Checks 1-4 times a day Range: 114-209   Physical Exam BP 124/80   Pulse 85   Ht 5\' 10"  (1.778 m)   Wt (!) 326 lb (147.9 kg)   LMP 11/09/2013   SpO2 98%   BMI 46.78 kg/m    Constitutional: well developed, well nourished Head: normocephalic, atraumatic Eyes: sclera anicteric, no redness Neck: supple Lungs: normal respiratory effort Neurology: alert and oriented Skin: dry, no appreciable rashes Musculoskeletal: no appreciable defects Psychiatric: normal mood and affect Diabetic Foot Exam - Simple   No data filed     Current Medications Patient's Medications  New Prescriptions   No medications on file  Previous Medications   ALLOPURINOL  (ZYLOPRIM ) 300 MG TABLET    TAKE 1 TABLET BY MOUTH EVERY DAY   AMLODIPINE  (NORVASC ) 10 MG TABLET    TAKE 1 TABLET BY MOUTH EVERY DAY   ASPIRIN  81 MG EC TABLET    TAKE 1 TABLET BY MOUTH EVERY DAY   BISOPROLOL  (ZEBETA ) 10 MG TABLET    TAKE 1 TABLET BY MOUTH EVERY DAY   BLOOD GLUCOSE MONITORING SUPPL (ONE TOUCH ULTRA 2) W/DEVICE KIT    1 Device by Does not apply route 2 (two) times daily.   CHOLECALCIFEROL (D-1000 EXTRA STRENGTH) 25 MCG (1000 UT) TABLET    Take 1 tablet (1,000 Units total) by mouth daily.   CLOBETASOL  OINTMENT (TEMOVATE ) 0.05 %    Apply a pea sized amount to the vulva BID for up to 2 weeks as needed   CLONIDINE  (CATAPRES ) 0.1 MG  TABLET    Take 0.1 mg by mouth 2 (two) times daily.   DAPAGLIFLOZIN  PROPANEDIOL (FARXIGA ) 10 MG TABS TABLET    TAKE 1 TABLET DAILY BEFORE BREAKFAST   DULAGLUTIDE  (TRULICITY ) 4.5 MG/0.5ML SOAJ    INJECT 4.5 MG UNDER THE SKIN ONCE A WEEK AS DIRECTED   GABAPENTIN  (NEURONTIN ) 100 MG CAPSULE    TAKE 1 CAPSULE BY MOUTH AT BEDTIME.   GLUCAGON  HCL (GLUCAGON  EMERGENCY) 1 MG/ML SOLR    Inject 1 mg as directed as needed (low blood sugar with impaired consciouness).   GLUCOSE BLOOD (ONETOUCH VERIO) TEST STRIP    CHECKS SUGARS 4 TIMES DAILY, ON MEAL TIME INSULIN    INSULIN  DEGLUDEC (TRESIBA  FLEXTOUCH) 200 UNIT/ML FLEXTOUCH PEN    Inject 130 Units into the skin daily. And pen needles 1/day   INSULIN  PEN NEEDLE (BD PEN NEEDLE  NANO 2ND GEN) 32G X 4 MM MISC    1 Needle by Does not apply route daily at 6 (six) AM. Use as instructed to inject insulin  1/day   INSULIN  PEN NEEDLE (PEN NEEDLES) 33G X 4 MM MISC    Use as instructed to inject insulin  1/day   LANCETS (ONETOUCH DELICA PLUS LANCET33G) MISC    USE TWICE A DAY   LEVOTHYROXINE  (SYNTHROID ) 50 MCG TABLET    Take 1 tablet (50 mcg total) by mouth daily.   MECLIZINE  (ANTIVERT ) 25 MG TABLET    TAKE 1 TABLET BY MOUTH TWICE A DAY   METFORMIN  (GLUCOPHAGE ) 1000 MG TABLET    Take 1 tablet (1,000 mg total) by mouth 2 (two) times daily with a meal.   MULTIPLE VITAMIN (MULTIVITAMIN) TABLET    Take 1 tablet by mouth daily.   NYSTATIN  CREAM (MYCOSTATIN )    Apply 1 Application topically 2 (two) times daily. Apply to affected area (under breasts and stomach) BID for up to 7 days.   POTASSIUM CHLORIDE  (KLOR-CON ) 20 MEQ PACKET    Take 20 mEq by mouth 2 (two) times daily.   ROSUVASTATIN  (CRESTOR ) 20 MG TABLET    TAKE 1 TABLET BY MOUTH EVERY DAY   SPIRONOLACTONE (ALDACTONE) 25 MG TABLET    Take 25 mg by mouth daily.   VALSARTAN -HYDROCHLOROTHIAZIDE  (DIOVAN -HCT) 320-25 MG TABLET    TAKE 1 TABLET BY MOUTH EVERY DAY  Modified Medications   No medications on file  Discontinued  Medications   No medications on file    Allergies Allergies  Allergen Reactions   Penicillins Hives and Swelling    Has patient had a PCN reaction causing immediate rash, facial/tongue/throat swelling, SOB or lightheadedness with hypotension: Yes Has patient had a PCN reaction causing severe rash involving mucus membranes or skin necrosis: No Has patient had a PCN reaction that required hospitalization: No Has patient had a PCN reaction occurring within the last 10 years: No If all of the above answers are "NO", then may proceed with Cephalosporin use.    Past Medical History Past Medical History:  Diagnosis Date   Chronic gout    followed by pcp---  multiple sites (06-11-2019 per pt last episode fall 2020)   H/O echocardiogram 04-04-2018  received via fax on 06-11-2019 to be scanned in   Dr. Chapman Commodore, [facility]-   Hyperlipidemia    Hypertension 2000   managed by Dr. Glena Landau, cardiology   Hypokalemia    Hypothyroidism    endocrinologist-- dr Washington Hacker-- s/p bx's 03-18-2018 and 07-02-2013 results in epic   OSA on CPAP    pt states has older machine is very large   Peripheral neuropathy    PMB (postmenopausal bleeding)    Sarcoidosis of lung [facility])    pulmonology-- dr wert---  s/p brochoscopy 07-05-2016, no sarcoid  (dr wert released pt as needed per lov note in epic 06-02-2018)   Thyroid  goiter    Type 2 diabetes mellitus treated with insulin  [facility])    endocrinology--- dr Washington Hacker   (06-11-2019  per pt checks blood surgar daily in am,  fasting surgar-- 85 -- 130)   Wears glasses     Past Surgical History Past Surgical History:  Procedure Laterality Date   COLONOSCOPY WITH PROPOFOL  N/A 09/21/2013   Procedure: COLONOSCOPY WITH PROPOFOL ;  Surgeon: Tami Falcon, MD;  Location: [facility];  Service: Endoscopy;  Laterality: N/A;   CYST EXCISION  teen   right lower back, benign   DILATATION &  CURETTAGE/HYSTEROSCOPY WITH MYOSURE N/A 06/16/2019   Procedure:  DILATATION & CURETTAGE/HYSTEROSCOPY WITH MYOSURE/POLYPECTOMY;  Surgeon: Wanita Gutta, MD;  Location: [facility];  Service: Gynecology;  Laterality: N/A;  polypectomy   ENDOMETRIAL BIOPSY  2021   Dr. Dickey Fought   TUBAL LIGATION Bilateral yrs ago   VIDEO BRONCHOSCOPY Bilateral 07/05/2016   Procedure: VIDEO BRONCHOSCOPY WITH FLUORO;  Surgeon: Diamond Formica, MD;  Location: [facility];  Service: Endoscopy;  Laterality: Bilateral;    Family History family history includes Heart disease in her brother and father; Hypertension in her father.  Social History Social History   Socioeconomic History   Marital status: Single    Spouse name: Not on file   Number of children: Not on file   Years of education: Not on file   Highest education level: Not on file  Occupational History   Not on file  Tobacco Use   Smoking status: Never   Smokeless tobacco: Never  Vaping Use   Vaping status: Never Used  Substance and Sexual Activity   Alcohol use: No   Drug use: Never   Sexual activity: Not on file  Other Topics Concern   Not on file  Social History Narrative   Lives with sister Pierrette Brick, single, has 3 sons.   Doing some walking for exercise.  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  Awaiting disability determination.   06/2019.     Social Drivers of Corporate investment banker Strain: Not on file  Food Insecurity: Not on file  Transportation Needs: Not on file  Physical Activity: Not on file  Stress: Not on file  Social Connections: Not on file  Intimate Partner Violence: Not on file    Lab Results  Component Value Date   HGBA1C 8.1 (A) 07/03/2023   Lab Results  Component Value Date   CHOL 134 05/31/2022   Lab Results  Component Value Date   HDL 40 (L) 05/31/2022   Lab Results  Component Value Date   LDLCALC 70 05/31/2022   Lab Results  Component Value Date   TRIG 153 (H) 05/31/2022   Lab Results  Component  Value Date   CHOLHDL 3.4 05/31/2022   Lab Results  Component Value Date   CREATININE 1.21 (H) 06/04/2022   Lab Results  Component Value Date   MICROALBUR 1.3 06/04/2022     Parts of this note may have been dictated using voice recognition software. There may be variances in spelling and vocabulary which are unintentional. Not all errors are proofread. Please notify the [facility] if any discrepancies are noted or if the meaning of any statement is not clear.

## 2023-07-26 ENCOUNTER — Ambulatory Visit: Admitting: Dietician

## 2023-08-16 ENCOUNTER — Encounter: Payer: Self-pay | Admitting: Dietician

## 2023-08-16 ENCOUNTER — Encounter: Attending: "Endocrinology | Admitting: Dietician

## 2023-08-16 DIAGNOSIS — E1165 Type 2 diabetes mellitus with hyperglycemia: Secondary | ICD-10-CM | POA: Insufficient documentation

## 2023-08-16 DIAGNOSIS — Z794 Long term (current) use of insulin: Secondary | ICD-10-CM | POA: Insufficient documentation

## 2023-08-16 NOTE — Progress Notes (Signed)
 Diabetes Self-Management Education  Visit Type: Follow-up  Appt. Start Time: 1000 Appt. End Time: 1045  08/16/2023  [name], identified by name and date of birth, is a 63 y.o. female with a diagnosis of Diabetes:  .   ASSESSMENT Patient is here today with her sister.  She was last seen by this RD on 04/11/2023.  She states that she had stopped exercising but states that she can tell a difference (more leg pain, slower to get up, and increased fatigue) and plans on resuming exercise.  Not safe to walk in the neighborhood due to dogs. She continues to check her blood glucose 2-3 times per week.  139 fasting yesterday, 147 after lunch. 1 week food diary reviewed.  Increased processed meat at times.  Needs to be sure to get adequate protein.  History includes Type 2 diabetes dx 2015 and insulin  since 2017, HLD, HTN, OSA- on cpap, sarcoidosis, goiter, gout A1C  8.1% on 07/03/2023 increased from 7.6% 03/13/2023 and 7.4% 12/12/2022, 8.2% 09/07/2022, BUN 19, Creatinine 1.21, Potassium 3.9, GFR 49 on  06/04/2022 eGFR 40 on 02/26/2023, Medications include:  Farxiga , Trulicity , Tresiba  67 units bid, Metformin , synthroid , MVI, potassium, vitamin D    Weight hx: 327 lbs 08/16/2023 325 lbs 04/11/2023 320 lbs 12/28/2022 324 lbs 06/05/2022 325 lbs 03/12/2022 328 lbs 01/19/2022 318 lbs 01/18/2020  311 lbs 09/18/2019 316 lbs 08/24/2019 266 lbs 2018 291 lbs 08/02/2015 (lost by stopping soda) 325 lbs 2016    Patient lives with her sister and her sister's 85 yo son.  Her sister does the cooking now and patient will help.  She states that her sister told her to stop cooking when she got sick in 2018 as she would leave the stove on.   States the food budget is low at times. She has some memory issues which sister states have worsened. She is now on disability.  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 Avoids salt. Doesn't like raw vegetables as they hurt her teeth.  She states that she  needs to go to the dentist and would like her teeth to be pulled. She was walking in the neighborhood until neighbor's pit bulls have been out. She does have [employer] but cannot pay for the extra gas easily.  Receptive to exercise programs on you tube.  Last menstrual period 11/09/2013. There is no height or weight on file to calculate BMI.   Diabetes Self-Management Education - 08/16/23 1223       Visit Information   Visit Type Follow-up      Psychosocial Assessment   What is the hardest part about your diabetes right now, causing you the most concern, or is the most worrisome to you about your diabetes?   Making healty food and beverage choices;Being active    Self-care barriers None    Self-management support Doctor's office    Other persons present Patient;Family Member    Patient Concerns Nutrition/Meal planning;Problem Solving;Healthy Lifestyle;Weight Control    Special Needs None    Preferred Learning Style No preference indicated    Learning Readiness Ready      Pre-Education Assessment   Patient understands the diabetes disease and treatment process. Comprehends key points    Patient understands incorporating nutritional management into lifestyle. Needs Review    Patient undertands incorporating physical activity into lifestyle. Comprehends key points    Patient understands using medications safely. Comprehends key points    Patient understands monitoring blood glucose, interpreting and using  results Comprehends key points    Patient understands prevention, detection, and treatment of acute complications. Comprehends key points    Patient understands prevention, detection, and treatment of chronic complications. Compreheands key points    Patient understands how to develop strategies to address psychosocial issues. Comprehends key points    Patient understands how to develop strategies to promote health/change behavior. Needs Review      Complications   Last HgB  A1C per patient/outside source 8.1 %   07/03/2023   How often do you check your blood sugar? 1-2 times/day    Fasting Blood glucose range (mg/dL) 29-870;869-820    Postprandial Blood glucose range (mg/dL) 869-820      Dietary Intake   Breakfast None today and ham and eggs yesterday    Snack (morning) none    Lunch none    Snack (afternoon) none    Dinner fried breaded fish    Snack (evening) orange    Beverage(s) water, occasional juice      Activity / Exercise   Activity / Exercise Type ADL's      Patient Education   Previous Diabetes Education Yes   06/2023   Healthy Eating Meal options for control of blood glucose level and chronic complications.    Being Active Helped patient identify appropriate exercises in relation to his/her diabetes, diabetes complications and other health issue.;Role of exercise on diabetes management, blood pressure control and cardiac health.    Medications Reviewed patients medication for diabetes, action, purpose, timing of dose and side effects.    Diabetes Stress and Support Identified and addressed patients feelings and concerns about diabetes;Worked with patient to identify barriers to care and solutions      Individualized Goals (developed by patient)   Nutrition General guidelines for healthy choices and portions discussed    Physical Activity Exercise 5-7 days per week;15 minutes per day    Medications take my medication as prescribed    Monitoring  Test my blood glucose as discussed    Problem Solving Eating Pattern;Addressing barriers to behavior change    Reducing Risk examine blood glucose patterns;do foot checks daily;treat hypoglycemia with 15 grams of carbs if blood glucose less than 70mg /dL      Patient Self-Evaluation of Goals - Patient rates self as meeting previously set goals (% of time)   Nutrition 50 - 75 % (half of the time)    Physical Activity < 25% (hardly ever/never)    Medications >75% (most of the time)    Monitoring >75%  (most of the time)    Problem Solving and behavior change strategies  25 - 50% (sometimes)    Reducing Risk (treating acute and chronic complications) 25 - 50% (sometimes)    Health Coping 50 - 75 % (half of the time)      Post-Education Assessment   Patient understands the diabetes disease and treatment process. Comprehends key points    Patient understands incorporating nutritional management into lifestyle. Needs Review    Patient undertands incorporating physical activity into lifestyle. Needs Review    Patient understands using medications safely. Comphrehends key points    Patient understands monitoring blood glucose, interpreting and using results Needs Review    Patient understands prevention, detection, and treatment of acute complications. Comprehends key points    Patient understands prevention, detection, and treatment of chronic complications. Comprehends key points    Patient understands how to develop strategies to address psychosocial issues. Comprehends key points    Patient understands how  to develop strategies to promote health/change behavior. Needs Review      Outcomes   Expected Outcomes Demonstrated interest in learning but significant barriers to change    Future DMSE 2 months    Program Status Not Completed      Subsequent Visit   Since your last visit have you experienced any weight changes? Gain    Weight Gain (lbs) 2    Since your last visit, are you checking your blood glucose at least once a day? Yes          Individualized Plan for Diabetes Self-Management Training:   Learning Objective:  Patient will have a greater understanding of diabetes self-management. Patient education plan is to attend individual and/or group sessions per assessed needs and concerns.   Plan:   Patient Instructions  Consider resuming exercise.  Consider the senior center.  Exercise program on you tube. Continue Vitamin D  Find ways to eat more vegetables. Avoid/limit  processed meat Continue to drink increased water - hydration.  Expected Outcomes:  Demonstrated interest in learning but significant barriers to change  Education material provided:   If problems or questions, patient to contact team via:  Phone  Future DSME appointment: 2 months

## 2023-08-16 NOTE — Patient Instructions (Addendum)
 Consider resuming exercise.  Consider the senior center.  Exercise program on you tube. Continue Vitamin D  Find ways to eat more vegetables. Avoid/limit processed meat Continue to drink increased water - hydration.

## 2023-08-18 ENCOUNTER — Other Ambulatory Visit: Payer: Self-pay | Admitting: "Endocrinology

## 2023-08-18 DIAGNOSIS — Z794 Long term (current) use of insulin: Secondary | ICD-10-CM

## 2023-08-21 LAB — LAB REPORT - SCANNED
A1c: 8.2
EGFR (African American): 40

## 2023-09-02 ENCOUNTER — Other Ambulatory Visit

## 2023-09-03 LAB — MICROALBUMIN / CREATININE URINE RATIO
Creatinine, Urine: 179 mg/dL (ref 20–275)
Microalb Creat Ratio: 4 mg/g{creat} (ref <30)
Microalb, Ur: 0.8 mg/dL

## 2023-09-04 ENCOUNTER — Encounter: Payer: Self-pay | Admitting: "Endocrinology

## 2023-09-04 ENCOUNTER — Ambulatory Visit: Admitting: "Endocrinology

## 2023-09-04 VITALS — BP 130/80 | HR 85 | Ht 70.0 in | Wt 322.0 lb

## 2023-09-04 DIAGNOSIS — Z7984 Long term (current) use of oral hypoglycemic drugs: Secondary | ICD-10-CM

## 2023-09-04 DIAGNOSIS — G629 Polyneuropathy, unspecified: Secondary | ICD-10-CM

## 2023-09-04 DIAGNOSIS — Z794 Long term (current) use of insulin: Secondary | ICD-10-CM

## 2023-09-04 DIAGNOSIS — E7800 Pure hypercholesterolemia, unspecified: Secondary | ICD-10-CM

## 2023-09-04 DIAGNOSIS — E1165 Type 2 diabetes mellitus with hyperglycemia: Secondary | ICD-10-CM | POA: Diagnosis not present

## 2023-09-04 DIAGNOSIS — Z7985 Long-term (current) use of injectable non-insulin antidiabetic drugs: Secondary | ICD-10-CM | POA: Diagnosis not present

## 2023-09-04 MED ORDER — FREESTYLE LIBRE 3 PLUS SENSOR MISC: 3 refills | Status: AC

## 2023-09-04 MED ORDER — DEXCOM G7 SENSOR MISC: 1.0000 | 2 refills | Status: AC

## 2023-09-04 NOTE — Progress Notes (Signed)
 Outpatient Endocrinology Note  Amy Rosario Jan 11, 1961 [id number]   Referring Provider: Roanna Ezekiel NOVAK, MD Primary Care Provider: Roanna Ezekiel NOVAK, MD Reason for consultation: Subjective  Type 2 diabetes mellitus  Assessment & Plan  Diagnoses and all orders for this visit:  Uncontrolled type 2 diabetes mellitus with hyperglycemia (HCC)  Neuropathy  Long term (current) use of oral hypoglycemic drugs  Long-term (current) use of injectable non-insulin  antidiabetic drugs  Long-term insulin  use (HCC)  Pure hypercholesterolemia  Other orders -     Continuous Glucose Sensor (FREESTYLE LIBRE 3 PLUS SENSOR) MISC; Change sensor every 15 days. -     Continuous Glucose Sensor (DEXCOM G7 SENSOR) MISC; 1 Device by Does not apply route continuous.   Diabetes complicated by neuropathy, nephropathy with low GFR Hba1c goal less than 7, current Hba1c is 8.1. Will recommend for the following change of medications to: Tresiba  67 units bid Trulicity  4.5 mg weekly Metformin  1000 mg every day (GFR 40) Farxiga  10 mg every day (if UTI occurs - hold it and cut it to 5 mg every day) Ordered for libre and dexcom for patient to try so I can get data to adjust her insulin  doses  09/04/23 Patient is planning to see diabetes educator, instructed patient to against sweet tea/juice and portion control in diet, patient is not interested in starting short acting meal time insulin  as recommended  No known contraindications to any of above medications Glucagon  ordered 06/04/22 Doesn't want CGM  Hyperlipidemia 02/26/23: LDL 85, Tg 138, Chol 149, HDL 40 -on rosuvastatin  40 mg every day since 02/2023 -Follow low fat diet and exercise   -Blood pressure - Microalbumin/creatinine goal < 30 -On ACE/ARB Valsartan  320 mg qd -BP off goal: missed BP meds today, take meds, check BP at home and contact PCP -diet changes including salt restriction -limit eating outside -counseled BP targets per  standards of diabetes care -Uncontrolled blood pressure can lead to retinopathy, nephropathy and cardiovascular and atherosclerotic heart disease  Reviewed and counseled on: -A1C target -Blood sugar targets -Complications of uncontrolled diabetes  -Checking blood sugar before meals and bedtime and bring log next visit -All medications with mechanism of action and side effects -Hypoglycemia management: rule of 15's, Glucagon  Emergency Kit and medical alert ID -low-carb low-fat plate-method diet -At least 20 minutes of physical activity per day -Annual dilated retinal eye exam and foot exam -compliance and follow up needs -follow up as scheduled or earlier if problem gets worse  Call if blood sugar is less than 70 or consistently above 250    Take a 15 gm snack of carbohydrate at bedtime before you go to sleep if your blood sugar is less than 100.    If you are going to fast after midnight for a test or procedure, ask your physician for instructions on how to reduce/decrease your insulin  dose.    Call if blood sugar is less than 70 or consistently above 250  -Treating a low sugar by rule of 15  (15 gms of sugar every 15 min until sugar is more than 70) If you feel your sugar is low, test your sugar to be sure If your sugar is low (less than 70), then take 15 grams of a fast acting Carbohydrate (3-4 glucose tablets or glucose gel or 4 ounces of juice or regular soda) Recheck your sugar 15 min after treating low to make sure it is more than 70 If sugar is still less than 70, treat again with  15 grams of carbohydrate          Don't drive the hour of hypoglycemia  If unconscious/unable to eat or drink by mouth, use glucagon  injection or nasal spray baqsimi  and call 911. Can repeat again in 15 min if still unconscious.  Return in about 6 weeks (around 10/16/2023).   I spent more than 50% of today's visit counseling patient on symptoms, examination findings, lab findings, imaging results,  treatment decisions and monitoring and prognosis. The patient understood the recommendations and agrees with the treatment plan. All questions regarding treatment plan were fully answered  Amy Birmingham, MD    History of Present Illness Amy Rosario is a 63 y.o. year old female who presents for follow up for Type 2 diabetes mellitus.  [name] was first diagnosed in 2015.   Diabetes education +2  Home diabetes regimen: Tresiba  67 units bid Trulicity  4.5 mg weekly Metformin  1000 mg qd Farxiga  10mg  qd  Not taking Novolog    COMPLICATIONS -  MI/Stroke -  retinopathy, last eye exam 2022 +  neuropathy, last foot exam 2022-on gabapentin  100 mg tid +  nephropathy  BLOOD SUGAR DATA Forgot the log/meter Checks 2-3 times a day Range: 121-180  Physical Exam BP 130/80   Pulse 85   Ht 5' 10 (1.778 m)   Wt (!) 322 lb (146.1 kg)   LMP 11/09/2013   SpO2 94%   BMI 46.20 kg/m    Constitutional: well developed, well nourished Head: normocephalic, atraumatic Eyes: sclera anicteric, no redness Neck: supple Lungs: normal respiratory effort Neurology: alert and oriented Skin: dry, no appreciable rashes Musculoskeletal: no appreciable defects Psychiatric: normal mood and affect Diabetic Foot Exam - Simple   Simple Foot Form Diabetic Foot exam was performed with the following findings: Yes 09/04/2023  8:40 AM  Visual Inspection No deformities, no ulcerations, no other skin breakdown bilaterally: Yes Sensation Testing Intact to touch and monofilament testing bilaterally: Yes Pulse Check Posterior Tibialis and Dorsalis pulse intact bilaterally: Yes Comments Right small callus      Current Medications Patient's Medications  New Prescriptions   CONTINUOUS GLUCOSE SENSOR (DEXCOM G7 SENSOR) MISC    1 Device by Does not apply route continuous.   CONTINUOUS GLUCOSE SENSOR (FREESTYLE LIBRE 3 PLUS SENSOR) MISC    Change sensor every 15 days.  Previous Medications   ALLOPURINOL   (ZYLOPRIM ) 300 MG TABLET    TAKE 1 TABLET BY MOUTH EVERY DAY   AMLODIPINE  (NORVASC ) 10 MG TABLET    TAKE 1 TABLET BY MOUTH EVERY DAY   ASPIRIN  81 MG EC TABLET    TAKE 1 TABLET BY MOUTH EVERY DAY   BISOPROLOL  (ZEBETA ) 10 MG TABLET    TAKE 1 TABLET BY MOUTH EVERY DAY   BLOOD GLUCOSE MONITORING SUPPL (ONE TOUCH ULTRA 2) W/DEVICE KIT    1 Device by Does not apply route 2 (two) times daily.   CHOLECALCIFEROL (D-1000 EXTRA STRENGTH) 25 MCG (1000 UT) TABLET    Take 1 tablet (1,000 Units total) by mouth daily.   CLOBETASOL  OINTMENT (TEMOVATE ) 0.05 %    Apply a pea sized amount to the vulva BID for up to 2 weeks as needed   CLONIDINE  (CATAPRES ) 0.1 MG TABLET    Take 0.1 mg by mouth 2 (two) times daily.   DAPAGLIFLOZIN  PROPANEDIOL (FARXIGA ) 10 MG TABS TABLET    TAKE 1 TABLET DAILY BEFORE BREAKFAST   DULAGLUTIDE  (TRULICITY ) 4.5 MG/0.5ML SOAJ    INJECT 4.5 MG UNDER THE SKIN ONCE  A WEEK AS DIRECTED   GABAPENTIN  (NEURONTIN ) 100 MG CAPSULE    TAKE 1 CAPSULE BY MOUTH AT BEDTIME.   GLUCAGON  HCL (GLUCAGON  EMERGENCY) 1 MG/ML SOLR    Inject 1 mg as directed as needed (low blood sugar with impaired consciouness).   GLUCOSE BLOOD (ONETOUCH VERIO) TEST STRIP    CHECKS SUGARS 4 TIMES DAILY, ON MEAL TIME INSULIN    INSULIN  DEGLUDEC (TRESIBA  FLEXTOUCH) 200 UNIT/ML FLEXTOUCH PEN    Inject 130 Units into the skin daily. And pen needles 1/day   INSULIN  PEN NEEDLE (BD PEN NEEDLE NANO 2ND GEN) 32G X 4 MM MISC    1 Needle by Does not apply route daily at 6 (six) AM. Use as instructed to inject insulin  1/day   INSULIN  PEN NEEDLE (PEN NEEDLES) 33G X 4 MM MISC    Use as instructed to inject insulin  1/day   LANCETS (ONETOUCH DELICA PLUS LANCET33G) MISC    USE TWICE A DAY   LEVOTHYROXINE  (SYNTHROID ) 50 MCG TABLET    Take 1 tablet (50 mcg total) by mouth daily.   MECLIZINE  (ANTIVERT ) 25 MG TABLET    TAKE 1 TABLET BY MOUTH TWICE A DAY   METFORMIN  (GLUCOPHAGE ) 1000 MG TABLET    Take 1 tablet (1,000 mg total) by mouth 2 (two) times daily  with a meal.   MULTIPLE VITAMIN (MULTIVITAMIN) TABLET    Take 1 tablet by mouth daily.   NYSTATIN  CREAM (MYCOSTATIN )    Apply 1 Application topically 2 (two) times daily. Apply to affected area (under breasts and stomach) BID for up to 7 days.   POTASSIUM CHLORIDE  (KLOR-CON ) 20 MEQ PACKET    Take 20 mEq by mouth 2 (two) times daily.   ROSUVASTATIN  (CRESTOR ) 20 MG TABLET    TAKE 1 TABLET BY MOUTH EVERY DAY   SPIRONOLACTONE (ALDACTONE) 25 MG TABLET    Take 25 mg by mouth daily.   VALSARTAN -HYDROCHLOROTHIAZIDE  (DIOVAN -HCT) 320-25 MG TABLET    TAKE 1 TABLET BY MOUTH EVERY DAY  Modified Medications   No medications on file  Discontinued Medications   No medications on file    Allergies Allergies  Allergen Reactions   Penicillins Hives and Swelling    Has patient had a PCN reaction causing immediate rash, facial/tongue/throat swelling, SOB or lightheadedness with hypotension: Yes Has patient had a PCN reaction causing severe rash involving mucus membranes or skin necrosis: No Has patient had a PCN reaction that required hospitalization: No Has patient had a PCN reaction occurring within the last 10 years: No If all of the above answers are NO, then may proceed with Cephalosporin use.    Past Medical History Past Medical History:  Diagnosis Date   Chronic gout    followed by pcp---  multiple sites (06-11-2019 per pt last episode fall 2020)   H/O echocardiogram 04-04-2018  received via fax on 06-11-2019 to be scanned in   Dr. Rober Chroman, [facility]-   Hyperlipidemia    Hypertension 2000   managed by Dr. Chroman, cardiology   Hypokalemia    Hypothyroidism    endocrinologist-- dr kassie-- s/p bx's 03-18-2018 and 07-02-2013 results in epic   OSA on CPAP    pt states has older machine is very large   Peripheral neuropathy    PMB (postmenopausal bleeding)    Sarcoidosis of lung [facility])    pulmonology-- dr wert---  s/p brochoscopy 07-05-2016, no sarcoid  (dr wert  released pt as needed per lov note in epic 06-02-2018)  Thyroid  goiter    Type 2 diabetes mellitus treated with insulin  [facility])    endocrinology--- dr kassie   (06-11-2019  per pt checks blood surgar daily in am,  fasting surgar-- 85 -- 130)   Wears glasses     Past Surgical History Past Surgical History:  Procedure Laterality Date   COLONOSCOPY WITH PROPOFOL  N/A 09/21/2013   Procedure: COLONOSCOPY WITH PROPOFOL ;  Surgeon: Renaye Sous, MD;  Location: [facility];  Service: Endoscopy;  Laterality: N/A;   CYST EXCISION  teen   right lower back, benign   DILATATION & CURETTAGE/HYSTEROSCOPY WITH MYOSURE N/A 06/16/2019   Procedure: DILATATION & CURETTAGE/HYSTEROSCOPY WITH MYOSURE/POLYPECTOMY;  Surgeon: Jannis Kate Norris, MD;  Location: [facility];  Service: Gynecology;  Laterality: N/A;  polypectomy   ENDOMETRIAL BIOPSY  2021   Dr. Kate Jannis   TUBAL LIGATION Bilateral yrs ago   VIDEO BRONCHOSCOPY Bilateral 07/05/2016   Procedure: VIDEO BRONCHOSCOPY WITH FLUORO;  Surgeon: Darlean Ozell NOVAK, MD;  Location: [facility];  Service: Endoscopy;  Laterality: Bilateral;    Family History family history includes Heart disease in her brother and father; Hypertension in her father.  Social History Social History   Socioeconomic History   Marital status: Single    Spouse name: Not on file   Number of children: Not on file   Years of education: Not on file   Highest education level: Not on file  Occupational History   Not on file  Tobacco Use   Smoking status: Never   Smokeless tobacco: Never  Vaping Use   Vaping status: Never Used  Substance and Sexual Activity   Alcohol use: No   Drug use: Never   Sexual activity: Not on file  Other Topics Concern   Not on file  Social History Narrative   Lives with sister Harper Gavel, single, has 3 sons.   Doing some walking for exercise.  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  Awaiting disability  determination.   06/2019.     Social Drivers of Corporate investment banker Strain: Not on file  Food Insecurity: Not on file  Transportation Needs: Not on file  Physical Activity: Not on file  Stress: Not on file  Social Connections: Not on file  Intimate Partner Violence: Not on file    Lab Results  Component Value Date   HGBA1C 8.1 (A) 07/03/2023   Lab Results  Component Value Date   CHOL 134 05/31/2022   Lab Results  Component Value Date   HDL 40 (L) 05/31/2022   Lab Results  Component Value Date   LDLCALC 70 05/31/2022   Lab Results  Component Value Date   TRIG 153 (H) 05/31/2022   Lab Results  Component Value Date   CHOLHDL 3.4 05/31/2022   Lab Results  Component Value Date   CREATININE 1.21 (H) 06/04/2022   Lab Results  Component Value Date   MICROALBUR 0.8 09/02/2023     Parts of this note may have been dictated using voice recognition software. There may be variances in spelling and vocabulary which are unintentional. Not all errors are proofread. Please notify the dino if any discrepancies are noted or if the meaning of any statement is not clear.

## 2023-10-17 ENCOUNTER — Ambulatory Visit: Admitting: "Endocrinology

## 2023-10-31 ENCOUNTER — Encounter: Attending: "Endocrinology | Admitting: Dietician

## 2023-10-31 ENCOUNTER — Encounter: Payer: Self-pay | Admitting: Dietician

## 2023-10-31 VITALS — Wt 322.0 lb

## 2023-10-31 DIAGNOSIS — E1165 Type 2 diabetes mellitus with hyperglycemia: Secondary | ICD-10-CM | POA: Insufficient documentation

## 2023-10-31 DIAGNOSIS — Z794 Long term (current) use of insulin: Secondary | ICD-10-CM | POA: Insufficient documentation

## 2023-10-31 NOTE — Patient Instructions (Addendum)
 Keep up the exercise!  Great job with this.  Aim for a diet that is more antiinflammatory.    Whole foods and more plant based  Consider a trial for 3 weeks.  How to you feel.  More nutrition quality - does this food provide nutrients?  Plant Strong Podcast:  Look for quick meal ideas with [employer] and [employer]

## 2023-10-31 NOTE — Progress Notes (Signed)
 Diabetes Self-Management Education  Visit Type: Follow-up  Appt. Start Time: 0945 Appt. End Time: 1025  10/31/2023  [name], identified by name and date of birth, is a 63 y.o. female with a diagnosis of Diabetes:  .   ASSESSMENT Patient is here today alone.  She was last seen by this RD on 08/16/2023 She states that she has been walking a little but does not feel safe due to dogs.  She does an exercise program on line most every day and states she feels much better! She has gotten the [place] but has not started and did not bring it.  She paid >$200 for 1 month and states that she won't be able to do this again.  She did not bring her phone to the office.  Did give her a free sample and asked that she come back for training as she does not want to get her phone. Coupon card for [place] provided.] Her hands and ankle is swelling.  She states MD wanted to give her steroids and does not want this due to her blood glucose. Appetite is poor.  Poor nutrition quality.  Is not meeting her protein needs or micronutrient needs.  She continues to check her blood glucose 2-3 times per week.  121 fasting , 1401 after dinner. 1 week food diary reviewed.  Increased processed meat at times.  Needs to be sure to get adequate protein.   History includes Type 2 diabetes dx 2015 and insulin  since 2017, HLD, HTN, OSA- on cpap, sarcoidosis, goiter, gout A1C  8.1% on 07/03/2023 increased from 7.6% 03/13/2023 and 7.4% 12/12/2022, 8.2% 09/07/2022, BUN 19, Creatinine 1.21, Potassium 3.9, GFR 49 on  06/04/2022 eGFR 40 on 02/26/2023, Medications include:  Farxiga , Trulicity , Tresiba  67 units bid, Metformin , synthroid , MVI, potassium, vitamin D    Weight hx: 322 lbs 10/31/2023 327 lbs 08/16/2023 325 lbs 04/11/2023 320 lbs 12/28/2022 324 lbs 06/05/2022 325 lbs 03/12/2022 328 lbs 01/19/2022 318 lbs 01/18/2020  311 lbs 09/18/2019 316 lbs 08/24/2019 266 lbs 2018 291 lbs 08/02/2015 (lost by stopping soda) 325 lbs 2016     Patient lives with her sister and her sister's 65 yo son.  Her sister does the cooking now and patient will help.  She states that her sister told her to stop cooking when she got sick in 2018 as she would leave the stove on.   States the food budget is low at times. She has some memory issues which sister states have worsened. She is now on disability.  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 Avoids salt. Doesn't like raw vegetables as they hurt her teeth.  She states that she needs to go to the dentist and would like her teeth to be pulled. She was walking in the neighborhood until neighbor's pit bulls have been out. She does have [employer] but cannot pay for the extra gas easily.  Receptive to exercise programs on you tube.  Weight (!) 322 lb (146.1 kg), last menstrual period 11/09/2013. Body mass index is 46.2 kg/m.   Diabetes Self-Management Education - 10/31/23 1100       Visit Information   Visit Type Follow-up      Psychosocial Assessment   What is the hardest part about your diabetes right now, causing you the most concern, or is the most worrisome to you about your diabetes?   Making healty food and beverage choices    Self-care barriers None    Self-management support Doctor's  office;CDE visits    Other persons present Patient    Patient Concerns Nutrition/Meal planning;Problem Solving;Glycemic Control;Healthy Lifestyle;Weight Control    Special Needs None    Preferred Learning Style No preference indicated    Learning Readiness Ready      Pre-Education Assessment   Patient understands the diabetes disease and treatment process. Comprehends key points    Patient understands incorporating nutritional management into lifestyle. Needs Review    Patient undertands incorporating physical activity into lifestyle. Demonstrates understanding / competency    Patient understands using medications safely. Comprehends key points    Patient understands  monitoring blood glucose, interpreting and using results Comprehends key points    Patient understands prevention, detection, and treatment of acute complications. Needs Review    Patient understands prevention, detection, and treatment of chronic complications. Compreheands key points    Patient understands how to develop strategies to address psychosocial issues. Comprehends key points    Patient understands how to develop strategies to promote health/change behavior. Needs Review      Complications   Last HgB A1C per patient/outside source 8.1 %   06/06/2023   How often do you check your blood sugar? 3-4 times / week    Fasting Blood glucose range (mg/dL) 29-870    Postprandial Blood glucose range (mg/dL) 869-820    Number of hypoglycemic episodes per month 0      Dietary Intake   Breakfast egg and sausage biscuite    Snack (morning) none    Lunch none    Snack (afternoon) none    Dinner none    Snack (evening) ice cream and handful of chips    Beverage(s) water, occasional juice      Activity / Exercise   How many days per week do you exercise? 6    How many minutes per day do you exercise? 30    Total minutes per week of exercise 180      Patient Education   Previous Diabetes Education Yes   08/2023   Healthy Eating Role of diet in the treatment of diabetes and the relationship between the three main macronutrients and blood glucose level;Reviewed blood glucose goals for pre and post meals and how to evaluate the patients' food intake on their blood glucose level.;Meal options for control of blood glucose level and chronic complications.    Being Active Helped patient identify appropriate exercises in relation to his/her diabetes, diabetes complications and other health issue.;Role of exercise on diabetes management, blood pressure control and cardiac health.    Medications Reviewed patients medication for diabetes, action, purpose, timing of dose and side effects.    Monitoring  Taught/evaluated CGM (comment)    Chronic complications Identified and discussed with patient  current chronic complications    Diabetes Stress and Support Identified and addressed patients feelings and concerns about diabetes;Worked with patient to identify barriers to care and solutions      Individualized Goals (developed by patient)   Nutrition General guidelines for healthy choices and portions discussed    Physical Activity Exercise 5-7 days per week;30 minutes per day    Medications take my medication as prescribed    Monitoring  Consistenly use CGM    Problem Solving Eating Pattern;Addressing barriers to behavior change    Reducing Risk examine blood glucose patterns;treat hypoglycemia with 15 grams of carbs if blood glucose less than 70mg /dL      Patient Self-Evaluation of Goals - Patient rates self as meeting previously set goals (% of  time)   Nutrition 25 - 50% (sometimes)    Physical Activity >75% (most of the time)    Medications >75% (most of the time)    Monitoring >75% (most of the time)    Problem Solving and behavior change strategies  25 - 50% (sometimes)    Reducing Risk (treating acute and chronic complications) 25 - 50% (sometimes)    Health Coping 50 - 75 % (half of the time)      Post-Education Assessment   Patient understands the diabetes disease and treatment process. Comprehends key points    Patient understands incorporating nutritional management into lifestyle. Needs Review    Patient undertands incorporating physical activity into lifestyle. Demonstrates understanding / competency    Patient understands using medications safely. Comphrehends key points    Patient understands monitoring blood glucose, interpreting and using results Needs Review    Patient understands prevention, detection, and treatment of acute complications. Comprehends key points    Patient understands prevention, detection, and treatment of chronic complications. Comprehends key points     Patient understands how to develop strategies to address psychosocial issues. Comprehends key points    Patient understands how to develop strategies to promote health/change behavior. Needs Review      Outcomes   Expected Outcomes Demonstrated interest in learning. Expect positive outcomes    Future DMSE 2 months    Program Status Not Completed      Subsequent Visit   Since your last visit have you experienced any weight changes? Loss    Weight Gain (lbs) 5    Since your last visit, are you checking your blood glucose at least once a day? No          Individualized Plan for Diabetes Self-Management Training:   Learning Objective:  Patient will have a greater understanding of diabetes self-management. Patient education plan is to attend individual and/or group sessions per assessed needs and concerns.   Plan:   Patient Instructions  Keep up the exercise!  Great job with this.  Aim for a diet that is more antiinflammatory.    Whole foods and more plant based  Consider a trial for 3 weeks.  How to you feel.  More nutrition quality - does this food provide nutrients?  Plant [employer] Podcast:  Look for quick meal ideas with [employer] and Carrie  Expected Outcomes:  Demonstrated interest in learning. Expect positive outcomes  Education material provided: [employer] card, [employer] developer of Lifestyle Medicine) packet  If problems or questions, patient to contact team via:  Phone  Future DSME appointment: 2 months

## 2023-11-01 ENCOUNTER — Ambulatory Visit
Admission: RE | Admit: 2023-11-01 | Discharge: 2023-11-01 | Disposition: A | Discharge status: Home / Self Care | Source: Ambulatory Visit | Attending: Family Medicine | Admitting: Family Medicine

## 2023-11-01 DIAGNOSIS — Z1231 Encounter for screening mammogram for malignant neoplasm of breast: Secondary | ICD-10-CM

## 2023-11-21 ENCOUNTER — Encounter: Attending: "Endocrinology | Admitting: Dietician

## 2023-11-21 DIAGNOSIS — E1169 Type 2 diabetes mellitus with other specified complication: Secondary | ICD-10-CM | POA: Insufficient documentation

## 2023-11-21 NOTE — Progress Notes (Signed)
 Freestyle Libre Personal CGM Training  Start time:1010    End time: 1040 Total time: 30 minutes Amy Rosario was educated about the following:  -Getting to know device    (Receiver/phone programmed ) -Setting up device , trend arrows - sensor interaction with vitamin C: do not take more than 500 mg vitamin C daily -Inserting sensor (patient applied the sensor on his left upper back of arm  himself today with minimal assist. It appeared to be working and in warm up when he left the office) - using meter when test blood sugar symbol appears -Ending sensor session -Trouble shooting -Tape guide, ability to upload from home with email address (he says he nor wife do email)  - Discussed specifically No further questions at this time.

## 2023-11-27 ENCOUNTER — Other Ambulatory Visit: Payer: Self-pay

## 2023-11-27 ENCOUNTER — Telehealth: Payer: Self-pay

## 2023-11-27 DIAGNOSIS — E1165 Type 2 diabetes mellitus with hyperglycemia: Secondary | ICD-10-CM

## 2023-11-27 MED ORDER — INSULIN GLARGINE 100 UNIT/ML SOLOSTAR PEN: 67.0000 [IU] | PEN_INJECTOR | Freq: Two times a day (BID) | SUBCUTANEOUS | 1 refills | Status: AC

## 2023-11-27 MED ORDER — TRESIBA FLEXTOUCH 200 UNIT/ML ~~LOC~~ SOPN: 130.0000 [IU] | PEN_INJECTOR | Freq: Every day | SUBCUTANEOUS | 3 refills | Status: DC

## 2023-11-27 NOTE — Telephone Encounter (Signed)
 I called and spoke with the patient. She states she is taking 67units in the am and 67 units in the PM.   Lantus  has been sent.   Requested Prescriptions   Signed Prescriptions Disp Refills   insulin  glargine (LANTUS ) 100 UNIT/ML Solostar Pen 135 mL 1    Sig: Inject 67 Units into the skin 2 (two) times daily.    Authorizing Provider: THAPA, IRAQ    Ordering User: Amy Rosario

## 2023-11-27 NOTE — Telephone Encounter (Signed)
 Please confirm the current dose she is taking for Tresiba .  Prescribed dose of Tresiba  and does mention in Dr. Dartha office note is different.  Then okay to switch to Lantus  on same dose.

## 2023-11-27 NOTE — Telephone Encounter (Signed)
 Pt called stating that her insurance no longer covers Tresiba  u200. She wants to know what can she be switched to? Please advise in Dr. Dartha absence or hold for her?

## 2023-11-28 LAB — LAB REPORT - SCANNED
A1c: 7.9
Calcium: 10.2
EGFR: 36
PTH: 35

## 2023-12-02 ENCOUNTER — Encounter: Payer: Self-pay | Admitting: "Endocrinology

## 2023-12-02 ENCOUNTER — Ambulatory Visit (INDEPENDENT_AMBULATORY_CARE_PROVIDER_SITE_OTHER): Admitting: "Endocrinology

## 2023-12-02 VITALS — BP 112/80 | HR 84 | Ht 70.0 in | Wt 329.0 lb

## 2023-12-02 DIAGNOSIS — Z7984 Long term (current) use of oral hypoglycemic drugs: Secondary | ICD-10-CM

## 2023-12-02 DIAGNOSIS — Z7985 Long-term (current) use of injectable non-insulin antidiabetic drugs: Secondary | ICD-10-CM

## 2023-12-02 DIAGNOSIS — Z794 Long term (current) use of insulin: Secondary | ICD-10-CM

## 2023-12-02 DIAGNOSIS — E1165 Type 2 diabetes mellitus with hyperglycemia: Secondary | ICD-10-CM

## 2023-12-02 LAB — POCT GLYCOSYLATED HEMOGLOBIN (HGB A1C): Hemoglobin A1C: 7.6 % — AB (ref 4.0–5.6)

## 2023-12-02 NOTE — Patient Instructions (Signed)

## 2023-12-02 NOTE — Progress Notes (Addendum)
 Outpatient Endocrinology Note  [name] 1960/09/20 [id number]   Referring Provider: Roanna Ezekiel NOVAK, MD Primary Care Provider: Roanna Ezekiel NOVAK, MD Reason for consultation: Subjective  Type 2 diabetes mellitus  Assessment & Plan  Diagnoses and all orders for this visit:  Uncontrolled type 2 diabetes mellitus with hyperglycemia (HCC) -     POCT glycosylated hemoglobin (Hb A1C)   Diabetes complicated by neuropathy, nephropathy with low GFR Hba1c goal less than 7, current Hba1c is  Lab Results  Component Value Date   HGBA1C 7.6 (A) 12/02/2023   HGBA1C 8.1 (A) 07/03/2023   HGBA1C 7.6 (A) 03/13/2023   Will recommend for the following change of medications to: Lantuss 62-64 units bid Trulicity  4.5 mg weekly Metformin  1000 mg every day (GFR 36) Farxiga  10 mg every day (if UTI occurs - hold it and cut it to 5 mg every day) Ordered for libre and dexcom for patient to try so I can get data to adjust her insulin  doses  12/02/23 Patient is planning to see diabetes educator, instructed patient to against sweet tea/juice and portion control in diet, patient is not interested in starting short acting meal time insulin  as recommended  No known contraindications to any of above medications Glucagon  ordered 06/04/22 Doesn't want CGM  Hyperlipidemia 02/26/23: LDL 85, Tg 138, Chol 149, HDL 40 11/26/2023 GFR 36, AST 19, ALT 16, A1C 7.9 -on rosuvastatin  40 mg every day since 02/2023 -Follow low fat diet and exercise   -Blood pressure - Microalbumin/creatinine goal < 30 -On ACE/ARB Valsartan  320 mg qd -BP off goal: missed BP meds today, take meds, check BP at home and contact PCP -diet changes including salt restriction -limit eating outside -counseled BP targets per standards of diabetes care -Uncontrolled blood pressure can lead to retinopathy, nephropathy and cardiovascular and atherosclerotic heart disease  Reviewed and counseled on: -A1C target -Blood sugar  targets -Complications of uncontrolled diabetes  -Checking blood sugar before meals and bedtime and bring log next visit -All medications with mechanism of action and side effects -Hypoglycemia management: rule of 15's, Glucagon  Emergency Kit and medical alert ID -low-carb low-fat plate-method diet -At least 20 minutes of physical activity per day -Annual dilated retinal eye exam and foot exam -compliance and follow up needs -follow up as scheduled or earlier if problem gets worse  Call if blood sugar is less than 70 or consistently above 250    Take a 15 gm snack of carbohydrate at bedtime before you go to sleep if your blood sugar is less than 100.    If you are going to fast after midnight for a test or procedure, ask your physician for instructions on how to reduce/decrease your insulin  dose.    Call if blood sugar is less than 70 or consistently above 250  -Treating a low sugar by rule of 15  (15 gms of sugar every 15 min until sugar is more than 70) If you feel your sugar is low, test your sugar to be sure If your sugar is low (less than 70), then take 15 grams of a fast acting Carbohydrate (3-4 glucose tablets or glucose gel or 4 ounces of juice or regular soda) Recheck your sugar 15 min after treating low to make sure it is more than 70 If sugar is still less than 70, treat again with 15 grams of carbohydrate          Don't drive the hour of hypoglycemia  If unconscious/unable to eat or  drink by mouth, use glucagon  injection or nasal spray baqsimi  and call 911. Can repeat again in 15 min if still unconscious.  Return in about 3 months (around 03/03/2024).   I spent more than 50% of today's visit counseling patient on symptoms, examination findings, lab findings, imaging results, treatment decisions and monitoring and prognosis. The patient understood the recommendations and agrees with the treatment plan. All questions regarding treatment plan were fully answered  Amy Birmingham, MD    History of Present Illness Amy Rosario is a 63 y.o. year old female who presents for follow up for Type 2 diabetes mellitus.  ALAJIA Rosario was first diagnosed in 2015.   Diabetes education +2  Home diabetes regimen: Lantuss 67 units bid Trulicity  4.5 mg weekly Metformin  1000 mg qd Farxiga  10mg  qd  Not taking Novolog    COMPLICATIONS -  MI/Stroke -  retinopathy, last eye exam 2022 +  neuropathy, last foot exam 2022-on gabapentin  100 mg tid +  nephropathy  BLOOD SUGAR DATA CGM interpretation: At today's visit, we reviewed her CGM downloads. The full report is scanned in the media. Reviewing the CGM trends, BG are well controlled across the day, with some random lows intermittently in day or overnight.    Physical Exam BP 112/80   Pulse 84   Ht 5' 10 (1.778 m)   Wt (!) 329 lb (149.2 kg)   LMP 11/09/2013   SpO2 96%   BMI 47.21 kg/m    Constitutional: well developed, well nourished Head: normocephalic, atraumatic Eyes: sclera anicteric, no redness Neck: supple Lungs: normal respiratory effort Neurology: alert and oriented Skin: dry, no appreciable rashes Musculoskeletal: no appreciable defects Psychiatric: normal mood and affect Diabetic Foot Exam - Simple   No data filed     Current Medications Patient's Medications  New Prescriptions   No medications on file  Previous Medications   ALLOPURINOL  (ZYLOPRIM ) 300 MG TABLET    TAKE 1 TABLET BY MOUTH EVERY DAY   AMLODIPINE  (NORVASC ) 10 MG TABLET    TAKE 1 TABLET BY MOUTH EVERY DAY   ASPIRIN  81 MG EC TABLET    TAKE 1 TABLET BY MOUTH EVERY DAY   BISOPROLOL  (ZEBETA ) 10 MG TABLET    TAKE 1 TABLET BY MOUTH EVERY DAY   BLOOD GLUCOSE MONITORING SUPPL (ONE TOUCH ULTRA 2) W/DEVICE KIT    1 Device by Does not apply route 2 (two) times daily.   CHOLECALCIFEROL (D-1000 EXTRA STRENGTH) 25 MCG (1000 UT) TABLET    Take 1 tablet (1,000 Units total) by mouth daily.   CLOBETASOL  OINTMENT (TEMOVATE ) 0.05 %    Apply  a pea sized amount to the vulva BID for up to 2 weeks as needed   CLONIDINE  (CATAPRES ) 0.1 MG TABLET    Take 0.1 mg by mouth 2 (two) times daily.   CONTINUOUS GLUCOSE SENSOR (DEXCOM G7 SENSOR) MISC    1 Device by Does not apply route continuous.   CONTINUOUS GLUCOSE SENSOR (FREESTYLE LIBRE 3 PLUS SENSOR) MISC    Change sensor every 15 days.   DAPAGLIFLOZIN  PROPANEDIOL (FARXIGA ) 10 MG TABS TABLET    TAKE 1 TABLET DAILY BEFORE BREAKFAST   DULAGLUTIDE  (TRULICITY ) 4.5 MG/0.5ML SOAJ    INJECT 4.5 MG UNDER THE SKIN ONCE A WEEK AS DIRECTED   GABAPENTIN  (NEURONTIN ) 100 MG CAPSULE    TAKE 1 CAPSULE BY MOUTH AT BEDTIME.   GLUCAGON  HCL (GLUCAGON  EMERGENCY) 1 MG/ML SOLR    Inject 1 mg as directed as needed (low  blood sugar with impaired consciouness).   GLUCOSE BLOOD (ONETOUCH VERIO) TEST STRIP    CHECKS SUGARS 4 TIMES DAILY, ON MEAL TIME INSULIN    INSULIN  GLARGINE (LANTUS ) 100 UNIT/ML SOLOSTAR PEN    Inject 67 Units into the skin 2 (two) times daily.   INSULIN  PEN NEEDLE (BD PEN NEEDLE NANO 2ND GEN) 32G X 4 MM MISC    1 Needle by Does not apply route daily at 6 (six) AM. Use as instructed to inject insulin  1/day   INSULIN  PEN NEEDLE (PEN NEEDLES) 33G X 4 MM MISC    Use as instructed to inject insulin  1/day   LANCETS (ONETOUCH DELICA PLUS LANCET33G) MISC    USE TWICE A DAY   LEVOTHYROXINE  (SYNTHROID ) 50 MCG TABLET    Take 1 tablet (50 mcg total) by mouth daily.   MECLIZINE  (ANTIVERT ) 25 MG TABLET    TAKE 1 TABLET BY MOUTH TWICE A DAY   METFORMIN  (GLUCOPHAGE ) 1000 MG TABLET    Take 1 tablet (1,000 mg total) by mouth 2 (two) times daily with a meal.   MULTIPLE VITAMIN (MULTIVITAMIN) TABLET    Take 1 tablet by mouth daily.   NYSTATIN  CREAM (MYCOSTATIN )    Apply 1 Application topically 2 (two) times daily. Apply to affected area (under breasts and stomach) BID for up to 7 days.   POTASSIUM CHLORIDE  (KLOR-CON ) 20 MEQ PACKET    Take 20 mEq by mouth 2 (two) times daily.   ROSUVASTATIN  (CRESTOR ) 20 MG TABLET    TAKE  1 TABLET BY MOUTH EVERY DAY   SPIRONOLACTONE (ALDACTONE) 25 MG TABLET    Take 25 mg by mouth daily.   VALSARTAN -HYDROCHLOROTHIAZIDE  (DIOVAN -HCT) 320-25 MG TABLET    TAKE 1 TABLET BY MOUTH EVERY DAY  Modified Medications   No medications on file  Discontinued Medications   No medications on file    Allergies Allergies  Allergen Reactions   Penicillins Hives and Swelling    Has patient had a PCN reaction causing immediate rash, facial/tongue/throat swelling, SOB or lightheadedness with hypotension: Yes Has patient had a PCN reaction causing severe rash involving mucus membranes or skin necrosis: No Has patient had a PCN reaction that required hospitalization: No Has patient had a PCN reaction occurring within the last 10 years: No If all of the above answers are NO, then may proceed with Cephalosporin use.    Past Medical History Past Medical History:  Diagnosis Date   Chronic gout    followed by pcp---  multiple sites (06-11-2019 per pt last episode fall 2020)   H/O echocardiogram 04-04-2018  received via fax on 06-11-2019 to be scanned in   Dr. Rober Chroman, [facility]-   Hyperlipidemia    Hypertension 2000   managed by Dr. Chroman, cardiology   Hypokalemia    Hypothyroidism    endocrinologist-- dr kassie-- s/p bx's 03-18-2018 and 07-02-2013 results in epic   OSA on CPAP    pt states has older machine is very large   Peripheral neuropathy    PMB (postmenopausal bleeding)    Sarcoidosis of lung    pulmonology-- dr wert---  s/p brochoscopy 07-05-2016, no sarcoid  (dr wert released pt as needed per lov note in epic 06-02-2018)   Thyroid  goiter    Type 2 diabetes mellitus treated with insulin  [facility])    endocrinology--- dr kassie   (06-11-2019  per pt checks blood surgar daily in am,  fasting surgar-- 85 -- 130)   Wears glasses  Past Surgical History Past Surgical History:  Procedure Laterality Date   COLONOSCOPY WITH PROPOFOL  N/A 09/21/2013    Procedure: COLONOSCOPY WITH PROPOFOL ;  Surgeon: Renaye Sous, MD;  Location: [facility];  Service: Endoscopy;  Laterality: N/A;   CYST EXCISION  teen   right lower back, benign   DILATATION & CURETTAGE/HYSTEROSCOPY WITH MYOSURE N/A 06/16/2019   Procedure: DILATATION & CURETTAGE/HYSTEROSCOPY WITH MYOSURE/POLYPECTOMY;  Surgeon: Jannis Kate Norris, MD;  Location: [facility];  Service: Gynecology;  Laterality: N/A;  polypectomy   ENDOMETRIAL BIOPSY  2021   Dr. Kate Jannis   TUBAL LIGATION Bilateral yrs ago   VIDEO BRONCHOSCOPY Bilateral 07/05/2016   Procedure: VIDEO BRONCHOSCOPY WITH FLUORO;  Surgeon: Darlean Ozell NOVAK, MD;  Location: [facility];  Service: Endoscopy;  Laterality: Bilateral;    Family History family history includes Heart disease in her brother and father; Hypertension in her father.  Social History Social History   Socioeconomic History   Marital status: Single    Spouse name: Not on file   Number of children: Not on file   Years of education: Not on file   Highest education level: Not on file  Occupational History   Not on file  Tobacco Use   Smoking status: Never   Smokeless tobacco: Never  Vaping Use   Vaping status: Never Used  Substance and Sexual Activity   Alcohol use: No   Drug use: Never   Sexual activity: Not on file  Other Topics Concern   Not on file  Social History Narrative   Lives with sister Harper Gavel, single, has 3 sons.   Doing some walking for exercise.  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  Awaiting disability determination.   06/2019.     Social Drivers of Corporate Investment Banker Strain: Not on file  Food Insecurity: Not on file  Transportation Needs: Not on file  Physical Activity: Not on file  Stress: Not on file  Social Connections: Not on file  Intimate Partner Violence: Not on file    Lab Results  Component Value Date   HGBA1C 7.6 (A) 12/02/2023   Lab Results   Component Value Date   CHOL 134 05/31/2022   Lab Results  Component Value Date   HDL 40 (L) 05/31/2022   Lab Results  Component Value Date   LDLCALC 70 05/31/2022   Lab Results  Component Value Date   TRIG 153 (H) 05/31/2022   Lab Results  Component Value Date   CHOLHDL 3.4 05/31/2022   Lab Results  Component Value Date   CREATININE 1.21 (H) 06/04/2022   Lab Results  Component Value Date   MICROALBUR 0.8 09/02/2023     Parts of this note may have been dictated using voice recognition software. There may be variances in spelling and vocabulary which are unintentional. Not all errors are proofread. Please notify the dino if any discrepancies are noted or if the meaning of any statement is not clear.

## 2023-12-03 ENCOUNTER — Encounter: Payer: Self-pay | Admitting: "Endocrinology

## 2024-01-10 ENCOUNTER — Ambulatory Visit: Admitting: Dietician

## 2024-02-04 ENCOUNTER — Ambulatory Visit (HOSPITAL_COMMUNITY): Admission: EM | Admit: 2024-02-04 | Discharge: 2024-02-04 | Disposition: A | Discharge status: Home / Self Care

## 2024-02-04 ENCOUNTER — Ambulatory Visit (INDEPENDENT_AMBULATORY_CARE_PROVIDER_SITE_OTHER)

## 2024-02-04 ENCOUNTER — Encounter (HOSPITAL_COMMUNITY): Payer: Self-pay

## 2024-02-04 DIAGNOSIS — R112 Nausea with vomiting, unspecified: Secondary | ICD-10-CM | POA: Diagnosis not present

## 2024-02-04 DIAGNOSIS — J101 Influenza due to other identified influenza virus with other respiratory manifestations: Secondary | ICD-10-CM | POA: Diagnosis not present

## 2024-02-04 DIAGNOSIS — R0602 Shortness of breath: Secondary | ICD-10-CM

## 2024-02-04 LAB — POCT INFLUENZA A/B
Influenza A, POC: POSITIVE — AB
Influenza B, POC: NEGATIVE

## 2024-02-04 MED ORDER — BENZONATATE 100 MG PO CAPS: 100.0000 mg | ORAL_CAPSULE | Freq: Two times a day (BID) | ORAL | 0 refills | Status: AC | PRN

## 2024-02-04 MED ORDER — ACETAMINOPHEN 160 MG/5ML PO SOLN
650.0000 mg | Freq: Once | ORAL | Status: AC
  Administered 2024-02-04: 650 mg via ORAL

## 2024-02-04 MED ORDER — ONDANSETRON 4 MG PO TBDP: 4.0000 mg | ORAL_TABLET | Freq: Once | ORAL | Status: DC

## 2024-02-04 MED ORDER — ACETAMINOPHEN 160 MG/5ML PO SOLN
ORAL | Status: AC
  Filled 2024-02-04: qty 20.3

## 2024-02-04 MED ORDER — PREDNISONE 20 MG PO TABS: 40.0000 mg | ORAL_TABLET | Freq: Every day | ORAL | 0 refills | Status: AC

## 2024-02-04 MED ORDER — IPRATROPIUM-ALBUTEROL 0.5-2.5 (3) MG/3ML IN SOLN
RESPIRATORY_TRACT | Status: AC
  Filled 2024-02-04: qty 3

## 2024-02-04 MED ORDER — GUAIFENESIN ER 600 MG PO TB12: 600.0000 mg | ORAL_TABLET | Freq: Two times a day (BID) | ORAL | 0 refills | Status: AC

## 2024-02-04 MED ORDER — ONDANSETRON 4 MG PO TBDP: 4.0000 mg | ORAL_TABLET | Freq: Three times a day (TID) | ORAL | 0 refills | Status: AC | PRN

## 2024-02-04 MED ORDER — ALBUTEROL SULFATE HFA 108 (90 BASE) MCG/ACT IN AERS
2.0000 | INHALATION_SPRAY | Freq: Once | RESPIRATORY_TRACT | Status: AC
  Administered 2024-02-04: 2 via RESPIRATORY_TRACT

## 2024-02-04 MED ORDER — ALBUTEROL SULFATE HFA 108 (90 BASE) MCG/ACT IN AERS
INHALATION_SPRAY | RESPIRATORY_TRACT | Status: AC
  Filled 2024-02-04: qty 6.7

## 2024-02-04 MED ORDER — DEXAMETHASONE SOD PHOSPHATE PF 10 MG/ML IJ SOLN
10.0000 mg | Freq: Once | INTRAMUSCULAR | Status: AC
  Administered 2024-02-04: 10 mg via INTRAMUSCULAR

## 2024-02-04 MED ORDER — IPRATROPIUM-ALBUTEROL 0.5-2.5 (3) MG/3ML IN SOLN
3.0000 mL | Freq: Once | RESPIRATORY_TRACT | Status: AC
  Administered 2024-02-04: 3 mL via RESPIRATORY_TRACT

## 2024-02-04 MED ORDER — OSELTAMIVIR PHOSPHATE 30 MG PO CAPS: 30.0000 mg | ORAL_CAPSULE | Freq: Two times a day (BID) | ORAL | 0 refills | Status: AC

## 2024-02-04 MED ORDER — ACETAMINOPHEN 325 MG PO TABS: 650.0000 mg | ORAL_TABLET | Freq: Once | ORAL | Status: DC

## 2024-02-04 NOTE — ED Triage Notes (Signed)
 Patient here today with c/o cough, ST, wheeze, SOB, nasal congestion, chills, sweats, body aches, fatigue, and weakness X 3 days. Patient has been taking Coricidin with no relief. Her nephew has also been sick.

## 2024-02-04 NOTE — ED Provider Notes (Signed)
 " [facility]    CSN: [id number] Arrival date & time: 02/04/24  1313      History   Chief Complaint Chief Complaint  Patient presents with   Cough    HPI LORRANE MCCAY is a 63 y.o. female.   LANIYA FRIEDL is a 63 y.o. female presenting for chief complaint of cough, nasal congestion, sore throat, fever/chills, nausea, vomiting, and decreased appetite that started 3 days ago on February 01, 2024.  Cough is minimally productive with clear/yellow sputum.  She is not sure of her max temp at home, states she has had significant cold chills and hot sweats.  Last episode of vomiting was 2 days ago.  She has not eaten much in the last 2 days.  She is a type II diabetic and has a Dexcom continuous blood glucose monitor which currently shows glucose level of 99 while in clinic.  Denies signs or symptoms of hypoglycemia.  She has been drinking plenty of water to try to stay well-hydrated while sick.  Currently febrile at 100.4 without any recent antipyretic.  Reports intermittent shortness of breath associate with coughing.  Denies leg swelling, orthopnea, rashes, abdominal pain, dizziness, recent antibiotic or steroid use, or history of chronic respiratory problems like asthma or COPD.  Never smoker.  Taking Coricidin over-the-counter with minimal relief in symptoms. She has been vaccinated against influenza this year.   Cough   Past Medical History:  Diagnosis Date   Chronic gout    followed by pcp---  multiple sites (06-11-2019 per pt last episode fall 2020)   H/O echocardiogram 04-04-2018  received via fax on 06-11-2019 to be scanned in   Dr. Rober Chroman, [facility]-   Hyperlipidemia    Hypertension 2000   managed by Dr. Chroman, cardiology   Hypokalemia    Hypothyroidism    endocrinologist-- dr kassie-- s/p bx's 03-18-2018 and 07-02-2013 results in epic   OSA on CPAP    pt states has older machine is very large   Peripheral neuropathy    PMB  (postmenopausal bleeding)    Sarcoidosis of lung    pulmonology-- dr wert---  s/p brochoscopy 07-05-2016, no sarcoid  (dr wert released pt as needed per lov note in epic 06-02-2018)   Thyroid  goiter    Type 2 diabetes mellitus treated with insulin  [facility])    endocrinology--- dr kassie   (06-11-2019  per pt checks blood surgar daily in am,  fasting surgar-- 85 -- 130)   Wears glasses     Patient Active Problem List   Diagnosis Date Noted   Colon cancer screening 07/18/2022   Body mass index (BMI) 45.0-49.9, adult (HCC) 05/08/2021   Fatigue 05/08/2021   Hyperglycemia due to type 2 diabetes mellitus (HCC) 05/08/2021   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) 05/08/2021   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/13/2020   Cyclic citrullinated peptide (CCP) antibody positive 12/13/2020   Vertigo 10/17/2020   Fall 10/17/2020   Needs flu shot 10/17/2020   Acquired hypothyroidism 10/17/2020   Orthostatic dizziness 10/17/2020   Alkaline phosphatase elevation 10/17/2020   Acute pain of left shoulder 10/17/2020   Left arm pain 10/17/2020   Polyarthralgia 10/17/2020   Elevated uric acid in blood 12/28/2019   Need for hepatitis vaccination 12/28/2019   History of sarcoidosis 06/08/2019   Joint swelling 06/08/2019   Hypokalemia 06/08/2019   Decreased pedal pulses 02/24/2018   Vitamin D  deficiency 05/20/2017   Diffuse goiter 05/04/2017  Upper airway cough syndrome 05/03/2017   Nail hypertrophy 10/04/2016   Vaccine counseling 10/04/2016   Need for influenza vaccination 10/04/2016   Chronic gout without tophus 08/29/2016   Elevated alkaline phosphatase level 03/08/2016   Abnormal ultrasound of abdomen 03/08/2016   Fatty liver 03/08/2016   Encounter for screening mammogram for malignant neoplasm of breast 03/08/2016   Abnormal cervical Papanicolaou smear 03/08/2016   Type 2 diabetes mellitus with hyperglycemia, with long-term current use of insulin  (HCC)  06/27/2015   OSA on CPAP 06/27/2015   Morbid obesity due to excess calories (HCC) 05/27/2013   Essential hypertension, benign 05/27/2013   Hyperlipidemia 05/27/2013    Past Surgical History:  Procedure Laterality Date   COLONOSCOPY WITH PROPOFOL  N/A 09/21/2013   Procedure: COLONOSCOPY WITH PROPOFOL ;  Surgeon: Renaye Sous, MD;  Location: [facility];  Service: Endoscopy;  Laterality: N/A;   CYST EXCISION  teen   right lower back, benign   DILATATION & CURETTAGE/HYSTEROSCOPY WITH MYOSURE N/A 06/16/2019   Procedure: DILATATION & CURETTAGE/HYSTEROSCOPY WITH MYOSURE/POLYPECTOMY;  Surgeon: Jannis Kate Norris, MD;  Location: [facility];  Service: Gynecology;  Laterality: N/A;  polypectomy   ENDOMETRIAL BIOPSY  2021   Dr. Kate Jannis   TUBAL LIGATION Bilateral yrs ago   VIDEO BRONCHOSCOPY Bilateral 07/05/2016   Procedure: VIDEO BRONCHOSCOPY WITH FLUORO;  Surgeon: Darlean Ozell NOVAK, MD;  Location: [facility];  Service: Endoscopy;  Laterality: Bilateral;    OB History     Gravida  3   Para  3   Term  3   Preterm      AB      Living  3      SAB      IAB      Ectopic      Multiple      Live Births               Home Medications    Prior to Admission medications  Medication Sig Start Date End Date Taking? Authorizing Provider  benzonatate  (TESSALON ) 100 MG capsule Take 1 capsule (100 mg total) by mouth 2 (two) times daily as needed for cough. 02/04/24  Yes Jesus Poplin, Dorna HERO, FNP  D3-50 1.25 MG (50000 UT) capsule Take 50,000 Units by mouth once a week. 11/20/23  Yes [provider]  guaiFENesin (MUCINEX) 600 MG 12 hr tablet Take 1 tablet (600 mg total) by mouth 2 (two) times daily. 02/04/24  Yes Enedelia Dorna HERO, FNP  ondansetron  (ZOFRAN -ODT) 4 MG disintegrating tablet Take 1 tablet (4 mg total) by mouth every 8 (eight) hours as needed for nausea or vomiting. 02/04/24  Yes Enedelia Dorna HERO, FNP  oseltamivir (TAMIFLU) 30 MG  capsule Take 1 capsule (30 mg total) by mouth 2 (two) times daily for 5 days. 02/04/24 02/09/24 Yes Enedelia Dorna HERO, FNP  predniSONE  (DELTASONE ) 20 MG tablet Take 2 tablets (40 mg total) by mouth daily with breakfast for 5 days. 02/04/24 02/09/24 Yes StanhopeDorna HERO, FNP  rosuvastatin  (CRESTOR ) 40 MG tablet Take 40 mg by mouth daily. 11/23/23  Yes [provider]  allopurinol  (ZYLOPRIM ) 300 MG tablet TAKE 1 TABLET BY MOUTH EVERY DAY 03/14/21   Tysinger, Alm RAMAN, PA-C  amLODipine  (NORVASC ) 10 MG tablet TAKE 1 TABLET BY MOUTH EVERY DAY 07/31/21   Tysinger, Alm RAMAN, PA-C  aspirin  81 MG EC tablet TAKE 1 TABLET BY MOUTH EVERY DAY 04/06/21   Tysinger, Alm RAMAN, PA-C  bisoprolol  (ZEBETA ) 10 MG tablet TAKE 1 TABLET BY  MOUTH EVERY DAY 11/23/21   Tysinger, Alm RAMAN, PA-C  Blood Glucose Monitoring Suppl (ONE TOUCH ULTRA 2) w/Device KIT 1 Device by Does not apply route 2 (two) times daily. 10/04/16   Tysinger, Alm RAMAN, PA-C  cloNIDine  (CATAPRES ) 0.1 MG tablet Take 0.1 mg by mouth 2 (two) times daily. 05/16/22   [provider]  Continuous Glucose Sensor (DEXCOM G7 SENSOR) MISC 1 Device by Does not apply route continuous. 09/04/23   Motwani, Komal, MD  Continuous Glucose Sensor (FREESTYLE LIBRE 3 PLUS SENSOR) MISC Change sensor every 15 days. 09/04/23   Motwani, Komal, MD  dapagliflozin  propanediol (FARXIGA ) 10 MG TABS tablet TAKE 1 TABLET DAILY BEFORE BREAKFAST 02/13/23   Motwani, Komal, MD  Dulaglutide  (TRULICITY ) 4.5 MG/0.5ML SOAJ INJECT 4.5 MG UNDER THE SKIN ONCE A WEEK AS DIRECTED 03/13/23   Dartha Ernst, MD  gabapentin  (NEURONTIN ) 100 MG capsule TAKE 1 CAPSULE BY MOUTH AT BEDTIME. 03/14/21   Tysinger, Alm RAMAN, PA-C  Glucagon  HCl (GLUCAGON  EMERGENCY) 1 MG/ML SOLR Inject 1 mg as directed as needed (low blood sugar with impaired consciouness). 06/04/22   Motwani, Komal, MD  glucose blood (ONETOUCH VERIO) test strip CHECKS SUGARS 4 TIMES DAILY, ON MEAL TIME INSULIN  01/03/22   Von [place], MD  insulin   glargine (LANTUS ) 100 UNIT/ML Solostar Pen Inject 67 Units into the skin 2 (two) times daily. 11/27/23   Thapa, Sudan, MD  Insulin  Pen Needle (BD PEN NEEDLE NANO 2ND GEN) 32G X 4 MM MISC 1 Needle by Does not apply route daily at 6 (six) AM. Use as instructed to inject insulin  1/day 11/05/22   Thapa, Sudan, MD  Insulin  Pen Needle (PEN NEEDLES) 33G X 4 MM MISC Use as instructed to inject insulin  1/day 06/12/21   Kassie Mallick, MD  Lancets [facility] DELICA PLUS [place]) MISC USE TWICE A DAY 12/07/19   Tysinger, Alm RAMAN, PA-C  levothyroxine  (SYNTHROID ) 50 MCG tablet Take 1 tablet (50 mcg total) by mouth daily. 03/14/22   [place]on [place], MD  meclizine  (ANTIVERT ) 25 MG tablet TAKE 1 TABLET BY MOUTH TWICE A DAY 03/14/21   Tysinger, Alm RAMAN, PA-C  metFORMIN  (GLUCOPHAGE ) 1000 MG tablet Take 1 tablet (1,000 mg total) by mouth 2 (two) times daily with a meal. 09/11/22   Motwani, Ernst, MD  Multiple Vitamin (MULTIVITAMIN) tablet Take 1 tablet by mouth daily.    [provider]  potassium chloride  (KLOR-CON ) 20 MEQ packet Take 20 mEq by mouth 2 (two) times daily. Patient taking differently: Take 20 mEq by mouth once. 06/08/19   Tysinger, Alm RAMAN, PA-C  spironolactone (ALDACTONE) 25 MG tablet Take 25 mg by mouth daily. 05/28/22   [provider]  valsartan -hydrochlorothiazide  (DIOVAN -HCT) 320-25 MG tablet TAKE 1 TABLET BY MOUTH EVERY DAY 10/21/20   Tysinger, Alm RAMAN, PA-C    Family History Family History  Problem Relation Age of Onset   Heart disease Father    Hypertension Father    Heart disease Brother    Cancer Neg Hx    Stroke Neg Hx    Diabetes Neg Hx    Breast cancer Neg Hx     Social History Social History[1]   Allergies   Penicillins   Review of Systems Review of Systems  Respiratory:  Positive for cough.   Per HPI   Physical Exam Triage Vital Signs ED Triage Vitals  Encounter Vitals Group     BP 02/04/24 1431 132/65     Girls Systolic BP Percentile --  Girls Diastolic  BP Percentile --      Boys Systolic BP Percentile --      Boys Diastolic BP Percentile --      Pulse Rate 02/04/24 1431 99     Resp 02/04/24 1431 16     Temp 02/04/24 1431 (!) 100.4 F (38 C)     Temp Source 02/04/24 1431 Oral     SpO2 02/04/24 1431 93 %     Weight --      Height --      Head Circumference --      Peak Flow --      Pain Score 02/04/24 1430 6     Pain Loc --      Pain Education --      Exclude from Growth Chart --    No data found.  Updated Vital Signs BP 132/65 (BP Location: Left Arm)   Pulse 99   Temp (!) 100.4 F (38 C) (Oral)   Resp 16   LMP 11/09/2013   SpO2 93%   Visual Acuity Right Eye Distance:   Left Eye Distance:   Bilateral Distance:    Right Eye Near:   Left Eye Near:    Bilateral Near:     Physical Exam Vitals and nursing note reviewed.  Constitutional:      Appearance: She is not ill-appearing or toxic-appearing.  HENT:     Head: Normocephalic and atraumatic.     Right Ear: Hearing, tympanic membrane, ear canal and external ear normal.     Left Ear: Hearing, tympanic membrane, ear canal and external ear normal.     Nose: Congestion present.     Mouth/Throat:     Lips: Pink.     Mouth: Mucous membranes are moist. No injury or oral lesions.     Dentition: Normal dentition.     Tongue: No lesions.     Pharynx: Oropharynx is clear. Uvula midline. No pharyngeal swelling, oropharyngeal exudate, posterior oropharyngeal erythema, uvula swelling or postnasal drip.     Tonsils: No tonsillar exudate.  Eyes:     General: Lids are normal. Vision grossly intact. Gaze aligned appropriately.     Extraocular Movements: Extraocular movements intact.     Conjunctiva/sclera: Conjunctivae normal.  Neck:     Trachea: Trachea and phonation normal.  Cardiovascular:     Rate and Rhythm: Normal rate and regular rhythm.     Heart sounds: Normal heart sounds, S1 normal and S2 normal.  Pulmonary:     Effort: Pulmonary effort is normal. No respiratory  distress.     Breath sounds: Normal air entry. Wheezing (Diffuse low pitched wheezing heard to the entire chest wall bilaterally.  Harsh and dry sounding frequent cough with deep inspiration on exam.  Audible wheezing heard without stethoscope.) present. No rhonchi or rales.  Chest:     Chest wall: No tenderness.  Musculoskeletal:     Cervical back: Neck supple.     Right lower leg: No edema.     Left lower leg: No edema.  Lymphadenopathy:     Cervical: No cervical adenopathy.  Skin:    General: Skin is warm and dry.     Capillary Refill: Capillary refill takes less than 2 seconds.     Findings: No rash.  Neurological:     General: No focal deficit present.     Mental Status: She is alert and oriented to person, place, and time. Mental status is at baseline.     Cranial Nerves:  No dysarthria or facial asymmetry.  Psychiatric:        Mood and Affect: Mood normal.        Speech: Speech normal.        Behavior: Behavior normal.        Thought Content: Thought content normal.        Judgment: Judgment normal.      UC Treatments / Results  Labs (all labs ordered are listed, but only abnormal results are displayed) Labs Reviewed  POCT INFLUENZA A/B - Abnormal; Notable for the following components:      Result Value   Influenza A, POC Positive (*)    All other components within normal limits    EKG   Radiology No results found.  Procedures Procedures (including critical care time)  Medications Ordered in UC Medications  acetaminophen  (TYLENOL ) 160 MG/5ML solution 650 mg (650 mg Oral Given 02/04/24 1457)  ipratropium-albuterol  (DUONEB) 0.5-2.5 (3) MG/3ML nebulizer solution 3 mL (3 mLs Nebulization Given 02/04/24 1549)  dexamethasone  (DECADRON ) injection 10 mg (10 mg Intramuscular Given 02/04/24 1549)  albuterol  (VENTOLIN  HFA) 108 (90 Base) MCG/ACT inhaler 2 puff (2 puffs Inhalation Given 02/04/24 1632)    Initial Impression / Assessment and Plan / UC Course  I have  reviewed the triage vital signs and the nursing notes.  Pertinent labs & imaging results that were available during my care of the patient were reviewed by me and considered in my medical decision making (see chart for details).   1.  Influenza A, nausea and vomiting, shortness of breath Flu a testing is positive.  COVID-19 and flu B testing are both negative.  Flu A point of care testing positive. Lungs wheezy on exam with audible wheezing heard without stethoscope and frequent cough. DuoNeb given as well as dexamethasone  10 mg IM.  She is a type II diabetic and we have discussed that dexamethasone  will likely cause temporary increase in her blood sugar.  She has a Dexcom monitor and is diligent in monitoring her blood sugars.  DuoNeb improved shortness of breath and wheezing significantly. Chest x-ray shows patchy fluffy area to the medial right lower lobe which likely represents early viral pneumonia, low suspicion for bacterial pneumonia given influenza A and timing of illness. Radiology re-read pending. Reviewed case and x-ray images with Dr. Vonna who agrees with treatment plan.   Offered antiviral given timing of illness, Tamiflu sent to pharmacy.  Creatinine clearance based on most recent blood work on August 20, 2023 is 53 mL/min corrected based on height.  She may have reduced dosing of Tamiflu based on creatinine clearance (30 mg every 12 hours for 5 days).  Zofran  every 6 hours as needed for nausea/vomiting.   Prednisone  burst for 5 days.   Recommend supportive care for further symptomatic relief as outlined in AVS.  Modes of transmission, quarantine recommendations, and hand hygiene discussed.  Counseled patient on potential for adverse effects with medications prescribed/recommended today, strict ER and return-to-clinic precautions discussed, patient verbalized understanding.    Final Clinical Impressions(s) / UC Diagnoses   Final diagnoses:  Influenza A  Nausea and  vomiting, unspecified vomiting type  Shortness of breath     Discharge Instructions      You have the flu. Take Tamiflu every 12 hours for the next 5 days to improve symptoms and stop the virus from replicating in your body. Ibuprofen /tylenol  as needed for fevers and body aches. Mucinex as needed for nasal congestion. Zofran  as needed for nausea/vomiting.  Take 2 pills of prednisone  once daily for the next 5 days starting tomorrow with food.   Use albuterol  2 puffs every 4-6 hours as needed for shortness of breath and wheezing.  We gave you a steroid shot in the clinic today which will help with your wheezing and shortness of breath but it might increase your blood sugars temporarily.  If you develop any new or worsening symptoms or if your symptoms do not start to improve, please return here or follow-up with your primary care provider. If your symptoms are severe, please go to the emergency room.     ED Prescriptions     Medication Sig Dispense Auth. Provider   oseltamivir (TAMIFLU) 30 MG capsule Take 1 capsule (30 mg total) by mouth 2 (two) times daily for 5 days. 10 capsule Enedelia Dorna HERO, FNP   ondansetron  (ZOFRAN -ODT) 4 MG disintegrating tablet Take 1 tablet (4 mg total) by mouth every 8 (eight) hours as needed for nausea or vomiting. 20 tablet Enedelia Dorna M, FNP   benzonatate  (TESSALON ) 100 MG capsule Take 1 capsule (100 mg total) by mouth 2 (two) times daily as needed for cough. 21 capsule Enedelia Dorna M, FNP   guaiFENesin (MUCINEX) 600 MG 12 hr tablet Take 1 tablet (600 mg total) by mouth 2 (two) times daily. 30 tablet Delores Edelstein M, FNP   predniSONE  (DELTASONE ) 20 MG tablet Take 2 tablets (40 mg total) by mouth daily with breakfast for 5 days. 10 tablet Enedelia Dorna HERO, FNP      PDMP not reviewed this encounter.     [1]  Social History Tobacco Use   Smoking status: Never   Smokeless tobacco: Never  Vaping Use   Vaping status:  Never Used  Substance Use Topics   Alcohol use: No   Drug use: Never     Enedelia Dorna HERO, FNP 02/04/24 1711  "

## 2024-02-04 NOTE — Discharge Instructions (Addendum)
 You have the flu. Take Tamiflu every 12 hours for the next 5 days to improve symptoms and stop the virus from replicating in your body. Ibuprofen /tylenol  as needed for fevers and body aches. Mucinex as needed for nasal congestion. Zofran  as needed for nausea/vomiting.  Take 2 pills of prednisone  once daily for the next 5 days starting tomorrow with food.   Use albuterol  2 puffs every 4-6 hours as needed for shortness of breath and wheezing.  We gave you a steroid shot in the clinic today which will help with your wheezing and shortness of breath but it might increase your blood sugars temporarily.  If you develop any new or worsening symptoms or if your symptoms do not start to improve, please return here or follow-up with your primary care provider. If your symptoms are severe, please go to the emergency room.

## 2024-02-05 ENCOUNTER — Ambulatory Visit (HOSPITAL_COMMUNITY): Payer: Self-pay

## 2024-02-25 LAB — LAB REPORT - SCANNED: A1c: 8

## 2024-03-09 ENCOUNTER — Ambulatory Visit: Admitting: "Endocrinology

## 2024-04-02 ENCOUNTER — Ambulatory Visit: Admitting: "Endocrinology
